# Patient Record
Sex: Female | Born: 1965
Health system: Southern US, Community
[De-identification: ages and names within clinical notes are randomized; demographics above are authoritative.]

## PROBLEM LIST (undated history)

## (undated) DIAGNOSIS — E119 Type 2 diabetes mellitus without complications: Secondary | ICD-10-CM

## (undated) DIAGNOSIS — E785 Hyperlipidemia, unspecified: Secondary | ICD-10-CM

## (undated) DIAGNOSIS — K635 Polyp of colon: Secondary | ICD-10-CM

## (undated) DIAGNOSIS — F329 Major depressive disorder, single episode, unspecified: Secondary | ICD-10-CM

## (undated) DIAGNOSIS — R42 Dizziness and giddiness: Secondary | ICD-10-CM

## (undated) DIAGNOSIS — I739 Peripheral vascular disease, unspecified: Secondary | ICD-10-CM

## (undated) DIAGNOSIS — I7090 Unspecified atherosclerosis: Secondary | ICD-10-CM

## (undated) DIAGNOSIS — M1990 Unspecified osteoarthritis, unspecified site: Secondary | ICD-10-CM

## (undated) DIAGNOSIS — I10 Essential (primary) hypertension: Secondary | ICD-10-CM

## (undated) DIAGNOSIS — G43909 Migraine, unspecified, not intractable, without status migrainosus: Secondary | ICD-10-CM

## (undated) DIAGNOSIS — F419 Anxiety disorder, unspecified: Secondary | ICD-10-CM

## (undated) DIAGNOSIS — K219 Gastro-esophageal reflux disease without esophagitis: Secondary | ICD-10-CM

## (undated) DIAGNOSIS — F32A Depression, unspecified: Secondary | ICD-10-CM

## (undated) HISTORY — PX: TOTAL ABDOMINAL HYSTERECTOMY: SHX209

## (undated) HISTORY — DX: Unspecified atherosclerosis: I70.90

## (undated) HISTORY — DX: Unspecified osteoarthritis, unspecified site: M19.90

## (undated) HISTORY — DX: Depression, unspecified: F32.A

## (undated) HISTORY — PX: ABDOMINAL HYSTERECTOMY: SHX81

## (undated) HISTORY — DX: Essential (primary) hypertension: I10

## (undated) HISTORY — DX: Peripheral vascular disease, unspecified: I73.9

## (undated) HISTORY — DX: Anxiety disorder, unspecified: F41.9

## (undated) HISTORY — DX: Dizziness and giddiness: R42

## (undated) HISTORY — DX: Hyperlipidemia, unspecified: E78.5

## (undated) HISTORY — DX: Polyp of colon: K63.5

## (undated) HISTORY — DX: Type 2 diabetes mellitus without complications: E11.9

## (undated) HISTORY — DX: Major depressive disorder, single episode, unspecified: F32.9

## (undated) HISTORY — DX: Migraine, unspecified, not intractable, without status migrainosus: G43.909

## (undated) HISTORY — PX: CHOLECYSTECTOMY: SHX55

## (undated) HISTORY — DX: Gastro-esophageal reflux disease without esophagitis: K21.9

---

## 2004-10-28 ENCOUNTER — Emergency Department (HOSPITAL_COMMUNITY): Admission: EM | Admit: 2004-10-28 | Discharge: 2004-10-28 | Payer: Self-pay | Admitting: Family Medicine

## 2004-10-28 ENCOUNTER — Ambulatory Visit (HOSPITAL_COMMUNITY): Admission: RE | Admit: 2004-10-28 | Discharge: 2004-10-28 | Payer: Self-pay | Admitting: Family Medicine

## 2004-11-01 ENCOUNTER — Encounter: Admission: RE | Admit: 2004-11-01 | Discharge: 2004-11-01 | Payer: Self-pay | Admitting: General Surgery

## 2005-03-25 ENCOUNTER — Ambulatory Visit (HOSPITAL_COMMUNITY): Admission: RE | Admit: 2005-03-25 | Discharge: 2005-03-25 | Payer: Self-pay | Admitting: General Surgery

## 2005-03-31 ENCOUNTER — Ambulatory Visit: Payer: Self-pay

## 2005-03-31 ENCOUNTER — Encounter: Payer: Self-pay | Admitting: Internal Medicine

## 2005-04-02 ENCOUNTER — Ambulatory Visit: Payer: Self-pay | Admitting: Internal Medicine

## 2005-04-16 ENCOUNTER — Encounter (INDEPENDENT_AMBULATORY_CARE_PROVIDER_SITE_OTHER): Payer: Self-pay | Admitting: *Deleted

## 2005-04-16 ENCOUNTER — Ambulatory Visit (HOSPITAL_COMMUNITY): Admission: RE | Admit: 2005-04-16 | Discharge: 2005-04-17 | Payer: Self-pay | Admitting: General Surgery

## 2005-07-12 ENCOUNTER — Observation Stay (HOSPITAL_COMMUNITY): Admission: EM | Admit: 2005-07-12 | Discharge: 2005-07-13 | Payer: Self-pay | Admitting: Emergency Medicine

## 2005-07-15 ENCOUNTER — Ambulatory Visit: Payer: Self-pay | Admitting: Family Medicine

## 2005-08-13 ENCOUNTER — Ambulatory Visit: Payer: Self-pay | Admitting: Family Medicine

## 2005-09-30 ENCOUNTER — Ambulatory Visit: Payer: Self-pay | Admitting: Family Medicine

## 2005-11-05 ENCOUNTER — Ambulatory Visit: Payer: Self-pay | Admitting: Family Medicine

## 2005-11-18 ENCOUNTER — Ambulatory Visit: Payer: Self-pay | Admitting: Family Medicine

## 2006-01-30 ENCOUNTER — Ambulatory Visit: Payer: Self-pay | Admitting: Family Medicine

## 2006-05-01 ENCOUNTER — Ambulatory Visit: Payer: Self-pay | Admitting: Family Medicine

## 2010-06-21 NOTE — Op Note (Signed)
Hannah Mcdaniel, Hannah Mcdaniel              ACCOUNT NO.:  [id number]   MEDICAL RECORD NO.:  [id number]          PATIENT TYPE:  OIB   LOCATION:  1010                         FACILITY:  [facility]   PHYSICIAN:  Anselm Pancoast. Weatherly, M.D.DATE OF BIRTH:  [birth year]   DATE OF PROCEDURE:  04/16/2005  DATE OF DISCHARGE:  04/17/2005                                 OPERATIVE REPORT   PREOPERATIVE DIAGNOSIS:  Chronic cholecystitis with stones.   POSTOPERATIVE DIAGNOSIS:  Chronic cholecystitis with stones.   OPERATIONS:  Laparoscopic cholecystectomy with cholangiogram.   SURGEON:  Dr. Consuello Bossier   ASSISTANT:  Dr. Jerelene Redden.   ANESTHESIA:  General.   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  He  slowly recovered, and she was sent for an ultrasound that does show stones  within her gallbladder, and she has had intermittent episodes of epigastric  discomfort, usually postprandially.  Her liver function studies were normal,  and her surgery was scheduled about a month ago, but she had an abnormal EKG  with flipped ST waves, etc., and anesthesia desired that she have a cardiac  clearance which was done with no abnormalities noted.  She is a cigarette  smoker but otherwise is in good health, and she is here today for a planned  cholecystectomy.  She is allergic to PENICILLIN and was given 400 mg of  Cipro intravenously preoperatively.  The patient was taken to the operative  suite.  Induction of general anesthesia, endotracheal tube, oral tube into  the stomach, and then abdomen was prepped with Betadine solution and draped  in a sterile manner.  A small incision was made below the umbilicus; the  fascia was identified, picked up between 2 Kochers, and a small opening  carefully made through the fascia into the peritoneal  cavity.  A pursestring  suture of 0 Vicryl was placed and Hasson cannula introduced.  The  gallbladder was tense but not acutely inflamed.  There were some adhesions  around it, and the upper 10 mL trocar was placed after anesthetizing the  fascia at the subxiphoid area, and the 2 lateral 5 mm trocars were placed in  the appropriate lateral position by Dr. Maryagnes Amos.  The gallbladder was grasped  and retracted upward.  The adhesions around it were carefully taken down,  good hemostasis obtained.  There were a little adhesions to the lateral  aspect of the liver that was dropped down to allow [place] to elevate the  gallbladder for better view.  The proximal portion of the gallbladder was  carefully dissected.  The junction of the cystic duct and gallbladder was  identified.  There was a little blood vessel located anterior to this, was  doubly clipped proximally, singly, distally and divided, and then the  gallbladder clip was placed on the junction of the cystic duct gallbladder.  A small opening was made in the cystic duct, and there was bile coming back  but as far as getting the catheter to go in, it was difficult.  Down there  where there was a twist, I could not see definitely a valve, but finally we  were able to kind of hook the catheter within the cystic duct, held in place  with a clip, and then an x-ray obtained.  The little short cystic duct with  good flow into the common bile duct, a very small common bile duct, and good  flow into the duodenum.  The catheter was then removed, and the cystic duct  was triply clipped very close to the ends so that we did not compromise this  junction with the common hepatic and common bile duct.  The gallbladder, the  cystic artery, and there were small branches, and these were doubly clipped  proximally and then divided and good hemostasis obtained as the gallbladder  was then freed from its bed.  I think actually we probably clipped the  anterior and  posterior branch of the cystic artery close to the gallbladder  and not really a large vessel, but there was good hemostasis, and  gallbladder was placed in the EndoCatch bag.  We went back and inspected at  the end where the cystic duct was clipped.  There was no evidence of any  bleeding and then switched the camera to the upper 10 mm port, withdrew the  gallbladder within the bag.  The additional figure-of-eight suture of 0  Vicryl was placed in the umbilicus; both were tied.  __________ Xylocaine in  the fascia, and then the lateral 5 mL trocar was withdrawn under direct  vision.  The irrigating fluid had been aspirated, and the subcutaneous  wounds were closed with 4-0 Vicryl.  Benzoin, Steri-Strips on the skin.  The  patient tolerated the procedure nicely and was sent to recovery room,  extubated in satisfactory postop condition.  We will plan on keeping her  tonight.  She should be discharged in the morning and hopefully will have no  problems postoperatively.           ______________________________  Anselm Pancoast. Zachery Dakins, M.D.     WJW/MEDQ  D:  04/16/2005  T:  04/17/2005  Job:  [id number]

## 2010-06-21 NOTE — Discharge Summary (Signed)
Hannah Mcdaniel, Hannah Mcdaniel              ACCOUNT NO.:  [id number]   MEDICAL RECORD NO.:  [id number]          PATIENT TYPE:  INP   LOCATION:  3738                         FACILITY:  [facility]   PHYSICIAN:  C. Ulyess Mort, M.D.DATE OF BIRTH:  09/04/1965   DATE OF ADMISSION:  07/12/2005  DATE OF DISCHARGE:  07/13/2005                                 DISCHARGE SUMMARY   DISCHARGE DIAGNOSES:  1.  Chest pain.  2.  Tobacco abuse.  3.  Dyslipidemia.  4.  History of laparoscopic cholecystectomy March 2007.  5.  History of hysterectomy.   MEDICATIONS AT DISCHARGE:  Zocor 20 mg p.o. daily.   CONDITION ON DISCHARGE:  Improved with resolution of chest pain, ruled out  for acute myocardial infarction.  The patient will follow up with her  primary care physician, Dr. Lysbeth Galas in [place].   PROCEDURES:  Chest x-ray July 12, 2005:  No acute disease.   BRIEF HISTORY AND PHYSICAL:  Ms. Bunte is a 45 year old woman with a positive  family history for coronary artery disease, current tobacco abuse, and known  EKG abnormalities, who presented to the emergency department with chest pain  which occurred while driving home from work.  The pain was described as  sharp, constant, nonexertional, and nonpleuritic.  The pain was associated  with left arm numbness, shortness of breath, diaphoresis, but no nausea or  vomiting.  She reports similar episodes of chest pain in the past, which  have also been nonexertional.  However, this episode is more severe.  The  pain lasted until the patient arrived to the emergency department and was  relieved with the administration of oxygen and nitroglycerin.  The patient  notes being under increased levels of stress recently.  Of note, T-wave  inversions were identified on an EKG done as a preoperative evaluation for a  cholecystectomy in March of this year.  She did have an echocardiogram, an  exercise stress test and a Cardiolite as workup for these abnormalities, all  of which  were negative.  She has been seen by [facility].   PHYSICAL EXAMINATION:  VITAL SIGNS:  Temperature is 97.9, blood pressure  102/56, pulse 72, respirations 18, oxygen saturation 99% on room air.  GENERAL:  She is in no acute distress.  She is not actively having chest  pain at the time of our evaluation.  HEENT:  Eyes:  Pupils equal, round, and reactive to light.  Extraocular  movements intact.  Sclerae were anicteric.  ENT:  Oropharynx is clear.  Mucous membranes are moist.  NECK:  Supple, no JVD, no thyromegaly.  LUNGS:  Clear to auscultation bilaterally.  CARDIAC:  Regular rate and rhythm.  No murmurs, rubs or gallops.  ABDOMEN:  Soft, nontender, nondistended, positive bowel sounds.  EXTREMITIES:  No edema.  NEUROLOGIC:  Nonfocal.   ADMISSION LABORATORY DATA:  White blood cells 9.7, hemoglobin 14.0,  platelets 264.  D-dimer less than 0.22.  Sodium 141, potassium 4.1, chloride  111, bicarb 24, BUN 13, creatinine 0.7, nonfasting glucose 157.  Urine drug  screen negative.  Point of care markers including a  troponin I were  negative.   HOSPITAL COURSE:  CHEST PAIN:  EKG on admission revealed T-wave  abnormalities in leads III and V1 through V4, which were similar to changes  noted on previous EKGs.  She was admitted for observation to rule out acute  myocardial infarction.  Cardiac enzymes were negative.  TSH was checked and  was within normal limits.  Total cholesterol on a fasting panel was 239 with  triglycerides of 359, HDL of 26 and LDL 141.  Given the patient's recent  thorough evaluation 3 or 4 months ago, a decision was made to focus on  reducing risk factors.  The patient was strongly encouraged to stop smoking.  She was given a prescription for Zocor 20 mg daily, given the very low HDL  and relatively high LDL.  She will follow up with her primary care  physician, Dr. Lysbeth Galas.  Of note, a blood glucose was 157 on a nonfasting  panel.  This may be indicative of glucose  intolerance and can also be  followed up by the patient's primary physician.  She has been instructed to  contact Dr. Lysbeth Galas in 1-2 weeks for a follow-up appointment.      Clent Demark, M.D.    ______________________________  C. Ulyess Mort, M.D.    Verlin Grills  D:  07/13/2005  T:  07/14/2005  Job:  [id number]   cc:   Delaney Meigs, M.D.  Fax: [phone]

## 2012-12-04 DIAGNOSIS — I7090 Unspecified atherosclerosis: Secondary | ICD-10-CM

## 2012-12-04 DIAGNOSIS — I739 Peripheral vascular disease, unspecified: Secondary | ICD-10-CM

## 2012-12-04 HISTORY — DX: Unspecified atherosclerosis: I70.90

## 2012-12-04 HISTORY — DX: Peripheral vascular disease, unspecified: I73.9

## 2012-12-29 ENCOUNTER — Other Ambulatory Visit: Payer: Self-pay | Admitting: Vascular Surgery

## 2012-12-29 DIAGNOSIS — I70219 Atherosclerosis of native arteries of extremities with intermittent claudication, unspecified extremity: Secondary | ICD-10-CM

## 2013-01-04 ENCOUNTER — Encounter: Payer: Self-pay | Admitting: Vascular Surgery

## 2013-01-04 ENCOUNTER — Other Ambulatory Visit: Payer: Self-pay

## 2013-01-05 ENCOUNTER — Encounter (INDEPENDENT_AMBULATORY_CARE_PROVIDER_SITE_OTHER): Payer: Self-pay

## 2013-01-05 ENCOUNTER — Ambulatory Visit (INDEPENDENT_AMBULATORY_CARE_PROVIDER_SITE_OTHER): Payer: Self-pay | Admitting: Vascular Surgery

## 2013-01-05 ENCOUNTER — Other Ambulatory Visit: Payer: Self-pay | Admitting: Vascular Surgery

## 2013-01-05 ENCOUNTER — Encounter: Payer: Self-pay | Admitting: Vascular Surgery

## 2013-01-05 ENCOUNTER — Other Ambulatory Visit: Payer: Self-pay

## 2013-01-05 ENCOUNTER — Ambulatory Visit (HOSPITAL_COMMUNITY)
Admission: RE | Admit: 2013-01-05 | Discharge: 2013-01-05 | Disposition: A | Discharge status: Home / Self Care | Payer: Self-pay | Source: Ambulatory Visit | Attending: Vascular Surgery | Admitting: Vascular Surgery

## 2013-01-05 ENCOUNTER — Ambulatory Visit (HOSPITAL_COMMUNITY)
Admission: RE | Admit: 2013-01-05 | Discharge: 2013-01-05 | Disposition: A | Discharge status: Home / Self Care | Payer: PRIVATE HEALTH INSURANCE | Source: Ambulatory Visit | Attending: Vascular Surgery | Admitting: Vascular Surgery

## 2013-01-05 VITALS — BP 117/65 | HR 78 | Ht 61.5 in | Wt 153.0 lb

## 2013-01-05 DIAGNOSIS — I70219 Atherosclerosis of native arteries of extremities with intermittent claudication, unspecified extremity: Secondary | ICD-10-CM | POA: Insufficient documentation

## 2013-01-05 MED ORDER — CLOPIDOGREL BISULFATE 75 MG PO TABS: 75.0000 mg | ORAL_TABLET | Freq: Every day | ORAL | Status: DC

## 2013-01-05 NOTE — Progress Notes (Signed)
[facility] [place]  Patient name: Hannah Mcdaniel MRN: [id number] DOB: Apr 07, 1965 Sex: female  REASON FOR CONSULT: right lower extremity claudication. Referred by Dr. Ophelia Charter.  HPI: Hannah Mcdaniel is a 47 y.o. female who developed the gradual onset of right hip pain and subsequently right calf pain approximately a year ago. She experienced pain in her right hip and also in her right calf even with simply standing or at rest. Her symptoms are aggravated by walking. Her symptoms have gradually progressed over the last year. She has not had any symptoms in her left lower extremity. She denies any history of rest pain. In fact, she states the elevating her right leg makes her symptoms feel better. He denies any history of nonhealing wounds.  On my history she denies any history of diabetes or hypertension. She does admit to hypercholesterolemia but states that she is currently not on medication. She smokes half a pack per day of cigarettes.   Past Medical History  Diagnosis Date  . Claudication of lower extremity Nov. 2014    Right Lower Extremity rest pain  . Diabetes mellitus without complication   . Hypertension   . Anxiety   . Arthritis   . Depression   . Migraines   . Arterial occlusive disease Nov. 2014   Family History  Problem Relation Age of Onset  . Diabetes Mother   . Hyperlipidemia Mother   . Hypertension Mother   . Cancer Father   . Diabetes Brother   . Hypertension Brother    SOCIAL HISTORY: History  Substance Use Topics  . Smoking status: Current Every Day Smoker -- 0.50 packs/day    Types: Cigarettes  . Smokeless tobacco: Never Used  . Alcohol Use: No   Allergies  Allergen Reactions  . Asa [Aspirin] Rash  . Codeine Rash  . Penicillins Rash   Current Outpatient Prescriptions  Medication Sig Dispense Refill  . citalopram (CELEXA) 10 MG tablet Take 10 mg by mouth daily.      . meclizine (ANTIVERT) 25 MG tablet Take 25 mg by mouth  3 (three) times daily as needed for dizziness.      Marland Kitchen HYDROcodone-acetaminophen (NORCO/VICODIN) 5-325 MG per tablet Take 1 tablet by mouth every 6 (six) hours as needed for moderate pain.      . methocarbamol (ROBAXIN) 500 MG tablet Take 500 mg by mouth 4 (four) times daily.       No current facility-administered medications for this visit.   REVIEW OF SYSTEMS: Arly.Keller ] denotes positive finding; [  ] denotes negative finding  CARDIOVASCULAR:  [ ]  chest pain   [ ]  chest pressure   [ ]  palpitations   [ ]  orthopnea   [ ]  dyspnea on exertion   Arly.Keller ] claudication right calf and right hip  [ ]  rest pain   [ ]  DVT   [ ]  phlebitis PULMONARY:   [ ]  productive cough   [ ]  asthma   [ ]  wheezing NEUROLOGIC:   [ ]  weakness  [ ]  paresthesias  [ ]  aphasia  [ ]  amaurosis  [ ]  dizziness HEMATOLOGIC:   [ ]  bleeding problems   [ ]  clotting disorders MUSCULOSKELETAL:  [ ]  joint pain   [ ]  joint swelling [ ]  leg swelling GASTROINTESTINAL: [ ]   blood in stool  [ ]   hematemesis GENITOURINARY:  [ ]   dysuria  [ ]   hematuria PSYCHIATRIC:  Arly.Keller ] history of major depression INTEGUMENTARY:  [ ]   rashes  [ ]  ulcers CONSTITUTIONAL:  [ ]  fever   [ ]  chills  PHYSICAL EXAM: Filed Vitals:   01/05/13 1009  BP: 117/65  Pulse: 78  Height: 5' 1.5" (1.562 m)  Weight: 153 lb (69.4 kg)  SpO2: 97%   Body mass index is 28.44 kg/(m^2). GENERAL: The patient is a well-nourished female, in no acute distress. The vital signs are documented above. CARDIOVASCULAR: There is a regular rate and rhythm. I do not detect carotid bruits. She has palpable radial pulses. On the left side, she has a palpable femoral, popliteal, and dorsalis pedis pulse. On the right side I cannot palpate a femoral, popliteal, dorsalis pedis, or posterior tibial pulse. She has no significant lower extremity swelling. PULMONARY: There is good air exchange bilaterally without wheezing or rales. ABDOMEN: Soft and non-tender with normal pitched bowel sounds. I do not  palpate an abdominal aortic aneurysm. MUSCULOSKELETAL: There are no major deformities or cyanosis. NEUROLOGIC: No focal weakness or paresthesias are detected. SKIN: There are no ulcers or rashes noted. PSYCHIATRIC: The patient has a normal affect.  DATA:  I have independently interpreted her duplex of her aorta and iliac arteries. She appears to have a stenosis of the right common iliac artery. Peak systolic velocity is 511 cm/s suggesting a significant stenosis.  I have also independently interpreted the right lower extremity duplex which shows occlusion of her posterior tibial and anterior tibial arteries. The peroneal artery appears to be patent.  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 I've recommended we proceed with arteriography to further evaluate her disease and potentially proceed with angioplasty and stenting of the right common iliac artery. I have explained however, that I am not convinced all of her symptoms are related to her peripheral vascular disease. She experiences pain simply with standing which did not really fit with her pattern of disease. In addition she states that elevating her right leg helped somewhat which would not fit with peripheral vascular disease. We have also had a long discussion about the importance of tobacco cessation. I have reviewed with the patient the indications for arteriography. In addition, I have reviewed the potential complications of arteriography including but not limited to: Bleeding, arterial injury, arterial thrombosis, dye action, renal insufficiency, or other unpredictable medical problems. I have explained to the patient that if we find disease amenable to angioplasty we could potentially address this at the same time. I have discussed  the potential complications of angioplasty and stenting, including but not limited to: Bleeding, arterial thrombosis, arterial injury, dissection, or the need for surgical intervention. I have written her a prescription for Plavix to begin taking prior to the procedure. If she has an intervention we would need to continue her Plavix after the procedure. If she does not undergo iliac angioplasty then we would discontinue this. Also stressed the importance of getting on a structured walking program and also the importance of nutrition. We will make further recommendations pending results of her arteriogram which is scheduled for 01/10/2013. Of note she is not on a statin but apparently had been on one. I'm not sure if this was discontinued because of intolerance. She also tells me that she's allergic to aspirin.   Shamere Dilworth S [facility] [place] Beeper: [phone]

## 2013-01-05 NOTE — Assessment & Plan Note (Signed)
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 I've recommended we proceed with arteriography to further evaluate her disease and potentially proceed with angioplasty and stenting of the right common iliac artery. I have explained however, that I am not convinced all of her symptoms are related to her peripheral vascular disease. She experiences pain simply with standing which did not really fit with her pattern of disease. In addition she states that elevating her right leg helped somewhat which would not fit with peripheral vascular disease. We have also had a long discussion about the importance of tobacco cessation. I have reviewed with the patient the indications for arteriography. In addition, I have reviewed the potential complications of arteriography including but not limited to: Bleeding, arterial injury, arterial thrombosis, dye action, renal insufficiency, or other unpredictable medical problems. I have explained to the patient that if we find disease amenable to angioplasty we could potentially address this at the same time. I have discussed the potential complications of angioplasty and stenting, including but not limited to: Bleeding, arterial thrombosis, arterial injury, dissection, or the need for surgical intervention. I have written her a prescription for Plavix to begin taking prior to the procedure. If she has an intervention we would need to continue her Plavix after the procedure. If she does not undergo iliac angioplasty then we would discontinue this. Also stressed the importance of getting on a structured walking program and also the importance of nutrition. We will make further recommendations pending results of her arteriogram which is scheduled for 01/10/2013. Of note she is not on a statin but apparently had been on one. I'm not sure if this was discontinued because of intolerance.  She also tells me that she's allergic to aspirin.

## 2013-01-07 ENCOUNTER — Encounter (HOSPITAL_COMMUNITY): Payer: Self-pay | Admitting: Pharmacy Technician

## 2013-01-10 ENCOUNTER — Other Ambulatory Visit: Payer: Self-pay | Admitting: *Deleted

## 2013-01-10 ENCOUNTER — Encounter (HOSPITAL_COMMUNITY)
Admission: RE | Disposition: A | Discharge status: Home / Self Care | Payer: Self-pay | Source: Ambulatory Visit | Attending: Vascular Surgery

## 2013-01-10 ENCOUNTER — Other Ambulatory Visit: Payer: Self-pay

## 2013-01-10 ENCOUNTER — Ambulatory Visit (HOSPITAL_COMMUNITY)
Admission: RE | Admit: 2013-01-10 | Discharge: 2013-01-10 | Disposition: A | Discharge status: Home / Self Care | Payer: PRIVATE HEALTH INSURANCE | Source: Ambulatory Visit | Attending: Vascular Surgery | Admitting: Vascular Surgery

## 2013-01-10 DIAGNOSIS — F329 Major depressive disorder, single episode, unspecified: Secondary | ICD-10-CM | POA: Insufficient documentation

## 2013-01-10 DIAGNOSIS — F411 Generalized anxiety disorder: Secondary | ICD-10-CM | POA: Insufficient documentation

## 2013-01-10 DIAGNOSIS — I739 Peripheral vascular disease, unspecified: Secondary | ICD-10-CM

## 2013-01-10 DIAGNOSIS — I10 Essential (primary) hypertension: Secondary | ICD-10-CM | POA: Insufficient documentation

## 2013-01-10 DIAGNOSIS — I70219 Atherosclerosis of native arteries of extremities with intermittent claudication, unspecified extremity: Secondary | ICD-10-CM | POA: Insufficient documentation

## 2013-01-10 DIAGNOSIS — Z79899 Other long term (current) drug therapy: Secondary | ICD-10-CM | POA: Insufficient documentation

## 2013-01-10 DIAGNOSIS — G43909 Migraine, unspecified, not intractable, without status migrainosus: Secondary | ICD-10-CM | POA: Insufficient documentation

## 2013-01-10 DIAGNOSIS — I7090 Unspecified atherosclerosis: Secondary | ICD-10-CM

## 2013-01-10 DIAGNOSIS — F3289 Other specified depressive episodes: Secondary | ICD-10-CM | POA: Insufficient documentation

## 2013-01-10 DIAGNOSIS — M129 Arthropathy, unspecified: Secondary | ICD-10-CM | POA: Insufficient documentation

## 2013-01-10 DIAGNOSIS — I708 Atherosclerosis of other arteries: Secondary | ICD-10-CM | POA: Insufficient documentation

## 2013-01-10 DIAGNOSIS — F17200 Nicotine dependence, unspecified, uncomplicated: Secondary | ICD-10-CM | POA: Insufficient documentation

## 2013-01-10 DIAGNOSIS — E119 Type 2 diabetes mellitus without complications: Secondary | ICD-10-CM | POA: Insufficient documentation

## 2013-01-10 HISTORY — PX: PERCUTANEOUS STENT INTERVENTION: SHX5500

## 2013-01-10 HISTORY — PX: OTHER SURGICAL HISTORY: SHX169

## 2013-01-10 HISTORY — PX: ABDOMINAL AORTAGRAM: SHX5454

## 2013-01-10 HISTORY — PX: LOWER EXTREMITY ANGIOGRAM: SHX5508

## 2013-01-10 LAB — POCT I-STAT, CHEM 8
BUN: 13 mg/dL (ref 6–23)
Calcium, Ion: 1.22 mmol/L (ref 1.12–1.23)
Chloride: 103 mEq/L (ref 96–112)
HCT: 46 % (ref 36.0–46.0)
Sodium: 139 mEq/L (ref 135–145)
TCO2: 25 mmol/L (ref 0–100)

## 2013-01-10 LAB — POCT ACTIVATED CLOTTING TIME
Activated Clotting Time: 191 seconds
Activated Clotting Time: 170 seconds

## 2013-01-10 SURGERY — ABDOMINAL AORTAGRAM
Anesthesia: LOCAL | Laterality: Right

## 2013-01-10 MED ORDER — HEPARIN SODIUM (PORCINE) 1000 UNIT/ML IJ SOLN
INTRAMUSCULAR | Status: AC
  Filled 2013-01-10: qty 1

## 2013-01-10 MED ORDER — ACETAMINOPHEN 325 MG RE SUPP: 325.0000 mg | RECTAL | Status: DC | PRN

## 2013-01-10 MED ORDER — HYDRALAZINE HCL 20 MG/ML IJ SOLN: 10.0000 mg | INTRAMUSCULAR | Status: DC | PRN

## 2013-01-10 MED ORDER — PANTOPRAZOLE SODIUM 40 MG PO TBEC: 40.0000 mg | DELAYED_RELEASE_TABLET | Freq: Every day | ORAL | Status: DC

## 2013-01-10 MED ORDER — SODIUM CHLORIDE 0.9 % IV SOLN: 500.0000 mL | Freq: Once | INTRAVENOUS | Status: DC | PRN

## 2013-01-10 MED ORDER — LIDOCAINE HCL (PF) 1 % IJ SOLN
INTRAMUSCULAR | Status: AC
  Filled 2013-01-10: qty 30

## 2013-01-10 MED ORDER — SODIUM CHLORIDE 0.9 % IV SOLN
INTRAVENOUS | Status: DC
  Administered 2013-01-10: 09:00:00 via INTRAVENOUS

## 2013-01-10 MED ORDER — ACETAMINOPHEN 325 MG PO TABS: 325.0000 mg | ORAL_TABLET | ORAL | Status: DC | PRN

## 2013-01-10 MED ORDER — CLOPIDOGREL BISULFATE 75 MG PO TABS: 75.0000 mg | ORAL_TABLET | Freq: Every day | ORAL | Status: DC

## 2013-01-10 MED ORDER — SODIUM CHLORIDE 0.9 % IV SOLN: 1.0000 mL/kg/h | INTRAVENOUS | Status: DC

## 2013-01-10 MED ORDER — HEPARIN (PORCINE) IN NACL 2-0.9 UNIT/ML-% IJ SOLN
INTRAMUSCULAR | Status: AC
  Filled 2013-01-10: qty 1000

## 2013-01-10 MED ORDER — CLOPIDOGREL BISULFATE 75 MG PO TABS
75.0000 mg | ORAL_TABLET | Freq: Once | ORAL | Status: AC
  Administered 2013-01-10: 75 mg via ORAL
  Filled 2013-01-10: qty 1

## 2013-01-10 MED ORDER — LABETALOL HCL 5 MG/ML IV SOLN: 10.0000 mg | INTRAVENOUS | Status: DC | PRN

## 2013-01-10 NOTE — Interval H&P Note (Signed)
History and Physical Interval Note:  01/10/2013 11:13 AM  Hannah Mcdaniel  has presented today for surgery, with the diagnosis of pvd  The various methods of treatment have been discussed with the patient and family. After consideration of risks, benefits and other options for treatment, the patient has consented to  Procedure(s): ABDOMINAL AORTAGRAM (N/A) as a surgical intervention .  The patient's history has been reviewed, patient examined, no change in status, stable for surgery.  I have reviewed the patient's chart and labs.  Questions were answered to the patient's satisfaction.     Isaic Syler

## 2013-01-10 NOTE — H&P (View-Only) (Signed)
  [facility] [place]  Patient name: Hannah Mcdaniel MRN: [id number] DOB: 09/05/1965 Sex: female  REASON FOR CONSULT: right lower extremity claudication. Referred by Dr. Yates.  HPI: Hannah Mcdaniel is a 47 y.o. female who developed the gradual onset of right hip pain and subsequently right calf pain approximately a year ago. She experienced pain in her right hip and also in her right calf even with simply standing or at rest. Her symptoms are aggravated by walking. Her symptoms have gradually progressed over the last year. She has not had any symptoms in her left lower extremity. She denies any history of rest pain. In fact, she states the elevating her right leg makes her symptoms feel better. He denies any history of nonhealing wounds.  On my history she denies any history of diabetes or hypertension. She does admit to hypercholesterolemia but states that she is currently not on medication. She smokes half a pack per day of cigarettes.   Past Medical History  Diagnosis Date  . Claudication of lower extremity Nov. 2014    Right Lower Extremity rest pain  . Diabetes mellitus without complication   . Hypertension   . Anxiety   . Arthritis   . Depression   . Migraines   . Arterial occlusive disease Nov. 2014   Family History  Problem Relation Age of Onset  . Diabetes Mother   . Hyperlipidemia Mother   . Hypertension Mother   . Cancer Father   . Diabetes Brother   . Hypertension Brother    SOCIAL HISTORY: History  Substance Use Topics  . Smoking status: Current Every Day Smoker -- 0.50 packs/day    Types: Cigarettes  . Smokeless tobacco: Never Used  . Alcohol Use: No   Allergies  Allergen Reactions  . Asa [Aspirin] Rash  . Codeine Rash  . Penicillins Rash   Current Outpatient Prescriptions  Medication Sig Dispense Refill  . citalopram (CELEXA) 10 MG tablet Take 10 mg by mouth daily.      . meclizine (ANTIVERT) 25 MG tablet Take 25 mg by mouth  3 (three) times daily as needed for dizziness.      . HYDROcodone-acetaminophen (NORCO/VICODIN) 5-325 MG per tablet Take 1 tablet by mouth every 6 (six) hours as needed for moderate pain.      . methocarbamol (ROBAXIN) 500 MG tablet Take 500 mg by mouth 4 (four) times daily.       No current facility-administered medications for this visit.   REVIEW OF SYSTEMS: [X ] denotes positive finding; [  ] denotes negative finding  CARDIOVASCULAR:  [ ] chest pain   [ ] chest pressure   [ ] palpitations   [ ] orthopnea   [ ] dyspnea on exertion   [X ] claudication right calf and right hip  [ ] rest pain   [ ] DVT   [ ] phlebitis PULMONARY:   [ ] productive cough   [ ] asthma   [ ] wheezing NEUROLOGIC:   [ ] weakness  [ ] paresthesias  [ ] aphasia  [ ] amaurosis  [ ] dizziness HEMATOLOGIC:   [ ] bleeding problems   [ ] clotting disorders MUSCULOSKELETAL:  [ ] joint pain   [ ] joint swelling [ ] leg swelling GASTROINTESTINAL: [ ]  blood in stool  [ ]  hematemesis GENITOURINARY:  [ ]  dysuria  [ ]  hematuria PSYCHIATRIC:  [X ] history of major depression INTEGUMENTARY:  [ ]   rashes  [ ] ulcers CONSTITUTIONAL:  [ ] fever   [ ] chills  PHYSICAL EXAM: Filed Vitals:   01/05/13 1009  BP: 117/65  Pulse: 78  Height: 5' 1.5" (1.562 m)  Weight: 153 lb (69.4 kg)  SpO2: 97%   Body mass index is 28.44 kg/(m^2). GENERAL: The patient is a well-nourished female, in no acute distress. The vital signs are documented above. CARDIOVASCULAR: There is a regular rate and rhythm. I do not detect carotid bruits. She has palpable radial pulses. On the left side, she has a palpable femoral, popliteal, and dorsalis pedis pulse. On the right side I cannot palpate a femoral, popliteal, dorsalis pedis, or posterior tibial pulse. She has no significant lower extremity swelling. PULMONARY: There is good air exchange bilaterally without wheezing or rales. ABDOMEN: Soft and non-tender with normal pitched bowel sounds. I do not  palpate an abdominal aortic aneurysm. MUSCULOSKELETAL: There are no major deformities or cyanosis. NEUROLOGIC: No focal weakness or paresthesias are detected. SKIN: There are no ulcers or rashes noted. PSYCHIATRIC: The patient has a normal affect.  DATA:  I have independently interpreted her duplex of her aorta and iliac arteries. She appears to have a stenosis of the right common iliac artery. Peak systolic velocity is 511 cm/s suggesting a significant stenosis.  I have also independently interpreted the right lower extremity duplex which shows occlusion of her posterior tibial and anterior tibial arteries. The peroneal artery appears to be patent.  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 I've recommended we proceed with arteriography to further evaluate her disease and potentially proceed with angioplasty and stenting of the right common iliac artery. I have explained however, that I am not convinced all of her symptoms are related to her peripheral vascular disease. She experiences pain simply with standing which did not really fit with her pattern of disease. In addition she states that elevating her right leg helped somewhat which would not fit with peripheral vascular disease. We have also had a long discussion about the importance of tobacco cessation. I have reviewed with the patient the indications for arteriography. In addition, I have reviewed the potential complications of arteriography including but not limited to: Bleeding, arterial injury, arterial thrombosis, dye action, renal insufficiency, or other unpredictable medical problems. I have explained to the patient that if we find disease amenable to angioplasty we could potentially address this at the same time. I have discussed  the potential complications of angioplasty and stenting, including but not limited to: Bleeding, arterial thrombosis, arterial injury, dissection, or the need for surgical intervention. I have written her a prescription for Plavix to begin taking prior to the procedure. If she has an intervention we would need to continue her Plavix after the procedure. If she does not undergo iliac angioplasty then we would discontinue this. Also stressed the importance of getting on a structured walking program and also the importance of nutrition. We will make further recommendations pending results of her arteriogram which is scheduled for 01/10/2013. Of note she is not on a statin but apparently had been on one. I'm not sure if this was discontinued because of intolerance. She also tells me that she's allergic to aspirin.   Vallery Mcdade S [facility] [place] Beeper: [phone]    

## 2013-01-10 NOTE — Op Note (Signed)
OPERATIVE REPORT  DATE OF SURGERY: 01/10/2013  PATIENT: Hannah Mcdaniel, 47 y.o. female MRN: [id number]  DOB: 02/07/1965  PRE-OPERATIVE DIAGNOSIS: Limiting claudication right leg  POST-OPERATIVE DIAGNOSIS:  Same  PROCEDURE: #1 aortogram with bilateral lower extremity runoff, #2 right common iliac pack artery angioplasty and Genesis stent placement 7 x 24 mm SURGEON:  Gretta Began, M.D.  PHYSICIAN ASSISTANT: Nurse  ANESTHESIA:  1% local  EBL: Minimal ml     BLOOD ADMINISTERED: None  DRAINS: None  SPECIMEN: None  COUNTS CORRECT:  YES  PLAN OF CARE: Holding area   PATIENT DISPOSITION:  PACU - hemodynamically stable  PROCEDURE DETAILS: The patient was taken to the peripheral vascular cath lab and placed supine position where the area of both groins were prepped and draped in the usual sterile fashion. Using SonoSite ultrasound the right common femoral artery was visualized. Using local anesthesia and the Seldinger technique the right common femoral artery was easily entered with an 18-gauge needle with a single wall puncture. Guidewire was passed centrally. This was a Transport planner. The guidewire would initially not pass the area of the common iliac artery on the right. A 5 French sheath was passed over the guidewire. A comfy catheter was passed over the guidewire and the guidewire easily passed into the level of the aorta. A pigtail catheter was then positioned over the guidewire the level of the suprarenal aorta an AP projection was undertaken. This showed either irregular plaque or organized thrombus at the level of the renal arteries. This did not appear to be flow limiting. The pigtail catheter was positioned further proximally to visualize the distal thoracic aorta and this was completely normal except for the area at the level of the renal arteries. A lateral projection of this did not give any additional information with apparently a posterior plaque at the level of the renal  arteries.  The patient did have a subtotal occlusion of the common to iliac artery just distal to the aortic bifurcation. Runoff films revealed normal anatomy on the left with widely patent common superficial femoral popliteal and three-vessel runoff on the left. On the right the superficial femoral and popliteal arteries were widely patent. The posterior tibial artery on the right occluded just past the origin with collaterals down towards the cath. The anterior tibial and peroneal arteries were patent proximally. The peroneal artery gave off collaterals to the posterior tibial at the ankle. The anterior tibial did not visualize past the ankle.  Decision was made to intervene on the symptomatic high-grade right common iliac lesion. The patient was given 5000 units of intravenous heparin. The 5 French sheath was exchanged for a 7 [place] right-tipped long sheath and this was positioned below the level of the iliac lesion. A retrograde hand injection revealed the location of the lesion and a marker was used. The determination was for a 7 mm x 24 mm Genesis stent stent on an op the balloon. The pigtail catheter was removed and the dilator was repositioned and the long stent was placed through the iliac stenosis. The stent was positioned at the appropriate location and the long sheath was withdrawn. The balloon was inflated and the stent was in excellent position. This was inflated for 8 atmospheres for 60 seconds. The balloon was then removed and the pigtail catheter was replaced above the level of the aortic bifurcation. Completion view showed excellent positioning with no residual stenosis. The patient pigtail catheter was removed over a guidewire and the long sheath  was removed back into the external iliac artery. The patient was transferred to the holding area in stable condition  Findings #1 irregular plaque or chronic thrombus at the level of the renal artery #2 high-grade right common iliac artery  stenosis which was treated successfully with a 7 x 24 Genesis stent #3 normal runoff in the left leg #4 normal common superficial femoral and popliteal artery with tibial vessel disease as described above   Gretta Began, M.D. 01/10/2013 12:17 PM

## 2013-01-10 NOTE — Progress Notes (Signed)
Pt discharge instruction given to pt per MD order.  Pt and Cg verbalize understanding. Pt encourged to call MD in AM to get samples for Plavix due to pt's inability to afford the medicine.

## 2013-01-11 LAB — POCT ACTIVATED CLOTTING TIME: Activated Clotting Time: 222 seconds

## 2013-01-12 ENCOUNTER — Telehealth: Payer: Self-pay | Admitting: *Deleted

## 2013-01-12 NOTE — Telephone Encounter (Signed)
Tried to call patient back re: Samples of Plavix, pt cannot afford them. Call would not go through and there was no voice mail available.  She is pending Medicaid patient. I asked Dr. Edilia Bo and he said that since patient is allergic to ASA, she has really no other options other than taking generic Plavix and far as cost is concerned. We will try to contact pt and see if she can call Dr. Joyce Copa office to see if they have any samples of plavix or any other suggestions. Annabelle Harman is trying to contact pt for scheduling of her followup appt (Aortogram with stent placement on 01-10-13 by [facility]).   She will need to be on medication because of stenting; perhaps her case worker could help her find a resource for this.

## 2013-01-13 ENCOUNTER — Telehealth: Payer: Self-pay | Admitting: Vascular Surgery

## 2013-01-13 NOTE — Telephone Encounter (Addendum)
Message copied by Fredrich Birks on Thu Jan 13, 2013 10:10 AM ------      Message from: Phillips Odor      Created: Mon Jan 10, 2013  4:18 PM      Regarding: FW: CT scan       Order has been placed/ CP      ----- Message -----         From: Chuck Hint, MD         Sent: 01/10/2013   1:56 PM           To: Melene Plan, RN, Conley Simmonds Pullins, RN      Subject: CT scan                                                  Dr Arbie Cookey has arranged f/u with me. She needs a CT with contrast to her chest and abdomen to evaluate "? Irregular plaque in juxtaranal aorta, prior to that visit.  Thanks      CD       ------  01/13/13: spoke with patient to inform of all appointment information. Mailed letter.

## 2013-02-04 ENCOUNTER — Other Ambulatory Visit: Payer: Self-pay | Admitting: Vascular Surgery

## 2013-02-04 LAB — CREATININE, SERUM: Creat: 0.63 mg/dL (ref 0.50–1.10)

## 2013-02-04 LAB — BUN: BUN: 13 mg/dL (ref 6–23)

## 2013-02-07 ENCOUNTER — Other Ambulatory Visit (HOSPITAL_COMMUNITY): Payer: Self-pay

## 2013-02-07 ENCOUNTER — Encounter: Payer: Self-pay | Admitting: Surgery

## 2013-02-08 ENCOUNTER — Encounter: Payer: Self-pay | Admitting: Vascular Surgery

## 2013-02-09 ENCOUNTER — Ambulatory Visit (INDEPENDENT_AMBULATORY_CARE_PROVIDER_SITE_OTHER): Payer: BC Managed Care – PPO | Admitting: Vascular Surgery

## 2013-02-09 ENCOUNTER — Other Ambulatory Visit: Payer: Self-pay

## 2013-02-09 ENCOUNTER — Encounter: Payer: Self-pay | Admitting: Vascular Surgery

## 2013-02-09 ENCOUNTER — Ambulatory Visit
Admission: RE | Admit: 2013-02-09 | Discharge: 2013-02-09 | Disposition: A | Discharge status: Home / Self Care | Payer: Self-pay | Source: Ambulatory Visit | Attending: Vascular Surgery | Admitting: Vascular Surgery

## 2013-02-09 VITALS — BP 144/83 | HR 101 | Resp 18 | Ht 61.0 in | Wt 158.0 lb

## 2013-02-09 DIAGNOSIS — I739 Peripheral vascular disease, unspecified: Secondary | ICD-10-CM | POA: Insufficient documentation

## 2013-02-09 DIAGNOSIS — I70219 Atherosclerosis of native arteries of extremities with intermittent claudication, unspecified extremity: Secondary | ICD-10-CM

## 2013-02-09 DIAGNOSIS — I7090 Unspecified atherosclerosis: Secondary | ICD-10-CM

## 2013-02-09 MED ORDER — IOHEXOL 300 MG/ML  SOLN
100.0000 mL | Freq: Once | INTRAMUSCULAR | Status: AC | PRN
  Administered 2013-02-09: 100 mL via INTRAVENOUS

## 2013-02-09 NOTE — Progress Notes (Signed)
   Patient name: Hannah Mcdaniel MRN: [id number] DOB: [birth year] Sex: female  REASON FOR VISIT: Follow up after right common iliac artery stenting.  HPI: Hannah Mcdaniel is a 48 y.o. female who I saw on 01/05/2013 with right lower extremity claudication. By duplex she had evidence of a stenosis in the right common iliac artery. This reason I set her up for an arteriogram and she underwent arteriography and stenting of the right common iliac artery by Dr. Donnetta Hutching on 01/10/2013. On her arteriogram was noted that she had some irregular plaque in the juxtarenal aorta and for this reason was set up for a CT scan. She comes in for a follow up visit.   Since her angioplasty her right lower extremity claudication symptoms have resolved. She denies rest pain. She does continue to smoke but is trying to cut back. She has not been taking Plavix because her insurance is not covered but she believes that she'll be able to get it soon. She is on aspirin.  REVIEW OF SYSTEMS: Valu.Nieves ] denotes positive finding; [  ] denotes negative finding  CARDIOVASCULAR:  [ ]  chest pain   [ ]  dyspnea on exertion    CONSTITUTIONAL:  [ ]  fever   [ ]  chills  PHYSICAL EXAM: Filed Vitals:   02/09/13 1259  BP: 144/83  Pulse: 101  Resp: 18  Height: 5\' 1"  (1.549 m)  Weight: 158 lb (71.668 kg)   Body mass index is 29.87 kg/(m^2). GENERAL: The patient is a well-nourished female, in no acute distress. The vital signs are documented above. CARDIOVASCULAR: There is a regular rate and rhythm. She has palpable femoral pulses and palpable dorsalis pedis pulses. She has no significant lower extremity swelling. PULMONARY: There is good air exchange bilaterally without wheezing or rales. Neuro: She has no focal weakness or paresthesias.   CT OF THE ABDOMEN: This shows extensive atherosclerotic disease of her abdominal aorta, but no critical stenosis.  MEDICAL ISSUES:  Atherosclerosis of native arteries of the extremities with  intermittent claudication The patient has done well status post right common iliac artery stenting. She think she'll be able to begin taking Plavix once her insurance to extend. We have also discussed the importance of tobacco cessation. She is on aspirin. I've ordered a follow up duplex of her right common iliac artery in 6 months with ABIs at that time also. I've encouraged her to stay as active as possible. We have also discussed the importance of nutrition. With respect to the plaque in her aorta this is fairly diffuse but there is no focal stenosis noted.   [place] [facility] [place] Beeper: [phone]

## 2013-02-09 NOTE — Assessment & Plan Note (Signed)
The patient has done well status post right common iliac artery stenting. She think she'll be able to begin taking Plavix once her insurance to extend. We have also discussed the importance of tobacco cessation. She is on aspirin. I've ordered a follow up duplex of her right common iliac artery in 6 months with ABIs at that time also. I've encouraged her to stay as active as possible. We have also discussed the importance of nutrition. With respect to the plaque in her aorta this is fairly diffuse but there is no focal stenosis noted.

## 2013-02-09 NOTE — Addendum Note (Signed)
Addended by: Mena Goes on: 02/09/2013 05:35 PM   Modules accepted: Orders

## 2013-02-11 ENCOUNTER — Other Ambulatory Visit (HOSPITAL_COMMUNITY): Payer: Self-pay

## 2013-02-11 ENCOUNTER — Encounter: Payer: Self-pay | Admitting: Vascular Surgery

## 2013-05-30 DIAGNOSIS — F331 Major depressive disorder, recurrent, moderate: Secondary | ICD-10-CM | POA: Insufficient documentation

## 2013-07-18 DIAGNOSIS — Z0279 Encounter for issue of other medical certificate: Secondary | ICD-10-CM

## 2013-08-10 ENCOUNTER — Other Ambulatory Visit (HOSPITAL_COMMUNITY): Payer: BC Managed Care – PPO

## 2013-08-10 ENCOUNTER — Encounter (HOSPITAL_COMMUNITY): Payer: BC Managed Care – PPO

## 2013-08-10 ENCOUNTER — Ambulatory Visit: Payer: BC Managed Care – PPO | Admitting: Vascular Surgery

## 2013-08-16 ENCOUNTER — Encounter: Payer: Self-pay | Admitting: Vascular Surgery

## 2013-08-17 ENCOUNTER — Other Ambulatory Visit: Payer: Self-pay | Admitting: Vascular Surgery

## 2013-08-17 ENCOUNTER — Encounter: Payer: Self-pay | Admitting: Vascular Surgery

## 2013-08-17 ENCOUNTER — Ambulatory Visit (INDEPENDENT_AMBULATORY_CARE_PROVIDER_SITE_OTHER): Payer: BC Managed Care – PPO | Admitting: Vascular Surgery

## 2013-08-17 ENCOUNTER — Ambulatory Visit (INDEPENDENT_AMBULATORY_CARE_PROVIDER_SITE_OTHER)
Admission: RE | Admit: 2013-08-17 | Discharge: 2013-08-17 | Disposition: A | Discharge status: Home / Self Care | Payer: BC Managed Care – PPO | Source: Ambulatory Visit | Attending: Vascular Surgery | Admitting: Vascular Surgery

## 2013-08-17 ENCOUNTER — Ambulatory Visit (HOSPITAL_COMMUNITY)
Admission: RE | Admit: 2013-08-17 | Discharge: 2013-08-17 | Disposition: A | Discharge status: Home / Self Care | Payer: BC Managed Care – PPO | Source: Ambulatory Visit | Attending: Vascular Surgery | Admitting: Vascular Surgery

## 2013-08-17 VITALS — BP 110/60 | HR 80 | Temp 98.4°F | Resp 18 | Ht 61.0 in | Wt 165.0 lb

## 2013-08-17 DIAGNOSIS — I70219 Atherosclerosis of native arteries of extremities with intermittent claudication, unspecified extremity: Secondary | ICD-10-CM

## 2013-08-17 DIAGNOSIS — I739 Peripheral vascular disease, unspecified: Secondary | ICD-10-CM

## 2013-08-17 DIAGNOSIS — M79609 Pain in unspecified limb: Secondary | ICD-10-CM

## 2013-08-17 DIAGNOSIS — Z48812 Encounter for surgical aftercare following surgery on the circulatory system: Secondary | ICD-10-CM

## 2013-08-17 DIAGNOSIS — M79661 Pain in right lower leg: Secondary | ICD-10-CM | POA: Insufficient documentation

## 2013-08-17 NOTE — Progress Notes (Signed)
     HPI: 48 y/o who is here for her 6 month follow up visit s/p right common iliac artery stenting.  She does report right calf pain after walking 1/2 mile.  The pain subsides with 10 min. Of rest.  She has no rest pain.  She and her daughter have started a walking program and she has cut down on her smoking to 6 cigarettes a day.  Her PCP has started her on Wellbutrin.     Current outpatient prescriptions:buPROPion (WELLBUTRIN SR) 200 MG 12 hr tablet, Take 200 mg by mouth 2 (two) times daily., Disp: , Rfl: ;  citalopram (CELEXA) 20 MG tablet, Take 40 mg by mouth at bedtime. , Disp: , Rfl: ;  clopidogrel (PLAVIX) 75 MG tablet, Take 1 tablet (75 mg total) by mouth daily., Disp: 30 tablet, Rfl: 11;  meclizine (ANTIVERT) 25 MG tablet, Take 25 mg by mouth 3 (three) times daily as needed. , Disp: , Rfl:  aspirin 81 MG tablet, Take 81 mg by mouth daily., Disp: , Rfl:   Review of Systems  Constitutional: Negative for fever and chills.  HENT: Negative for congestion.   Eyes: Negative for blurred vision and double vision.  Respiratory: Negative for cough and shortness of breath.   Gastrointestinal: Negative for nausea, vomiting and abdominal pain.  Musculoskeletal: Negative for joint pain.  Skin: Negative for rash.  Neurological: Negative for dizziness and headaches.  Psychiatric/Behavioral: Negative for depression.    Objective 110/60 80 98.4 F (36.9 C) (Oral) 18 92% Weight 165 lbs  General: [place] A & O x 3 Heart RRR Lungs CTA Abdomin: NTTP Vascular: palpable radial, femoral, DP/PT pulses equal bilaterally  Aorto-iliac duplex: Patent right common iliac artery stent  ABI: Triphasic wave forms bilateral Right 1.18 Left 1.09  Assessment/Planning: Atherosclerosis of native arteries of the extremities with intermittent claudication S/P right iliac artery stent She has mild intermittent calf pain on the right  She will continue her walking program, plavix, and smokin cessation. We  will see her back in 6 months for ABI's of bilateral LE. She was seen in clinic today by Dr. Pryor Montes, EMMA [facility] 08/17/2013 3:47 PM  Agree with above. She has almost quit tobacco completely. I've encouraged her to stay as active as possible. I'll see her back in 6 months with follow up ABIs. She knows to call sooner if she has problems.  [name], MD, [place] [phone] 08/17/2013

## 2013-08-18 NOTE — Addendum Note (Signed)
Addended by: Mena Goes on: 08/18/2013 09:45 AM   Modules accepted: Orders

## 2013-08-19 DIAGNOSIS — Z0279 Encounter for issue of other medical certificate: Secondary | ICD-10-CM

## 2013-12-19 DIAGNOSIS — Z0279 Encounter for issue of other medical certificate: Secondary | ICD-10-CM

## 2014-01-12 ENCOUNTER — Encounter (HOSPITAL_COMMUNITY): Payer: Self-pay | Admitting: Vascular Surgery

## 2014-01-31 ENCOUNTER — Other Ambulatory Visit: Payer: Self-pay | Admitting: Vascular Surgery

## 2014-01-31 ENCOUNTER — Other Ambulatory Visit: Payer: Self-pay | Admitting: *Deleted

## 2014-02-14 ENCOUNTER — Encounter: Payer: Self-pay | Admitting: Vascular Surgery

## 2014-02-15 ENCOUNTER — Ambulatory Visit (HOSPITAL_COMMUNITY)
Admission: RE | Admit: 2014-02-15 | Discharge: 2014-02-15 | Disposition: A | Discharge status: Home / Self Care | Payer: BLUE CROSS/BLUE SHIELD | Source: Ambulatory Visit | Attending: Vascular Surgery | Admitting: Vascular Surgery

## 2014-02-15 ENCOUNTER — Encounter: Payer: Self-pay | Admitting: Vascular Surgery

## 2014-02-15 ENCOUNTER — Ambulatory Visit (INDEPENDENT_AMBULATORY_CARE_PROVIDER_SITE_OTHER): Payer: BLUE CROSS/BLUE SHIELD | Admitting: Vascular Surgery

## 2014-02-15 VITALS — BP 117/77 | HR 79 | Ht 61.0 in | Wt 168.0 lb

## 2014-02-15 DIAGNOSIS — Z9862 Peripheral vascular angioplasty status: Secondary | ICD-10-CM

## 2014-02-15 DIAGNOSIS — I739 Peripheral vascular disease, unspecified: Secondary | ICD-10-CM | POA: Insufficient documentation

## 2014-02-15 DIAGNOSIS — Z48812 Encounter for surgical aftercare following surgery on the circulatory system: Secondary | ICD-10-CM | POA: Insufficient documentation

## 2014-02-15 DIAGNOSIS — Z9889 Other specified postprocedural states: Secondary | ICD-10-CM

## 2014-02-15 NOTE — Addendum Note (Signed)
Addended by: Mena Goes on: 02/15/2014 03:41 PM   Modules accepted: Orders

## 2014-02-15 NOTE — Progress Notes (Signed)
HISTORY AND PHYSICAL     CC:  6 month check up Referring Provider:  Dione Housekeeper, MD  HPI: This is a 49 y.o. female who returns for her 6 month follow up after right common iliac artery angioplasty and Genesis stent placement 7 x 24 mm on 01/10/13.  She states that she continues to have cramping in her right calf when she walks.  She can walk ~ 1/2 mile before she has to stop and rest.  She states that she does get right thigh numbness and has fallen a couple of times.  She denies having any back pain or trouble.   She does continue to smoke.  She does have a progressive plan to quit.  She is down to 4 cigarettes per day next week and will continue to decrease the amount each week.  She continues to be active and has been walking at [place] since it has been raining so much.    She states she does take a statin for her cholesterol and she is on Plavix, but is not on Aspirin.   Past Medical History  Diagnosis Date  . Claudication of lower extremity Nov. 2014    Right Lower Extremity rest pain  . Diabetes mellitus without complication   . Hypertension   . Anxiety   . Arthritis   . Depression   . Migraines   . Arterial occlusive disease Nov. 2014    Past Surgical History  Procedure Laterality Date  . Cholecystectomy      Gall Bladder  . Abdominal hysterectomy    . Iliac artery angioplasty and stent placement  01/10/13  . Abdominal aortagram N/A 01/10/2013    Procedure: ABDOMINAL AORTAGRAM;  Surgeon: Rosetta Posner, MD;  Location: [facility] CATH LAB;  Service: Cardiovascular;  Laterality: N/A;  . Percutaneous stent intervention Right 01/10/2013    Procedure: PERCUTANEOUS STENT INTERVENTION;  Surgeon: Rosetta Posner, MD;  Location: [facility] CATH LAB;  Service: Cardiovascular;  Laterality: Right;  rt common iliac stent  . Lower extremity angiogram Bilateral 01/10/2013    Procedure: LOWER EXTREMITY ANGIOGRAM;  Surgeon: Rosetta Posner, MD;  Location: [facility] CATH LAB;  Service: Cardiovascular;  Laterality:  Bilateral;    Allergies  Allergen Reactions  . Asa [Aspirin] Rash  . Codeine Rash  . Penicillins Rash    Current Outpatient Prescriptions  Medication Sig Dispense Refill  . aspirin 81 MG tablet Take 81 mg by mouth daily.    Marland Kitchen buPROPion (WELLBUTRIN SR) 200 MG 12 hr tablet Take 200 mg by mouth 2 (two) times daily.    . citalopram (CELEXA) 20 MG tablet Take 40 mg by mouth at bedtime.     . clopidogrel (PLAVIX) 75 MG tablet Take 1 tablet (75 mg total) by mouth daily. 30 tablet 11  . meclizine (ANTIVERT) 25 MG tablet Take 25 mg by mouth 3 (three) times daily as needed.      No current facility-administered medications for this visit.    Family History  Problem Relation Age of Onset  . Diabetes Mother   . Hyperlipidemia Mother   . Hypertension Mother   . Varicose Veins Mother   . Cancer Father   . Diabetes Brother   . Hypertension Brother   . Hyperlipidemia Brother     History   Social History  . Marital Status: Married    Spouse Name: N/A    Number of Children: N/A  . Years of Education: N/A   Occupational History  . Not on  file.   Social History Main Topics  . Smoking status: Current Every Day Smoker -- 0.50 packs/day    Types: Cigarettes  . Smokeless tobacco: Never Used     Comment: Also using vape E- cigs  . Alcohol Use: No  . Drug Use: No  . Sexual Activity: Not on file   Other Topics Concern  . Not on file   Social History Narrative     ROS: [x]  Positive   [ ]  Negative   [ ]  All sytems reviewed and are negative  Cardiovascular: []  chest pain/pressure []  palpitations []  SOB lying flat []  DOE [x]  pain in legs while walking [x]  pain in feet when lying flat []  hx of DVT []  hx of phlebitis [x]  swelling in legs [x]  varicose veins  Pulmonary: []  productive cough []  asthma []  wheezing  Neurologic: []  weakness in []  arms []  legs []  numbness in []  arms []  legs [] difficulty speaking or slurred speech []  temporary loss of vision in one eye []   dizziness  Hematologic: []  bleeding problems []  problems with blood clotting easily  GI []  vomiting blood []  blood in stool  GU: []  burning with urination []  blood in urine  Psychiatric: []  hx of major depression  Integumentary: []  rashes []  ulcers  Constitutional: []  fever []  chills   PHYSICAL EXAMINATION:  Filed Vitals:   02/15/14 1333  BP: 117/77  Pulse: 79   Body mass index is 31.76 kg/(m^2).  General:  WDWN in NAD Gait: Normal HENT: WNL, normocephalic Pulmonary: normal non-labored breathing , without Rales, rhonchi,  wheezing Cardiac: RRR, without  Murmurs, rubs or gallops; without carotid bruits Abdomen: obese Skin: without rashes, without ulcers  Vascular Exam/Pulses:  Right Left  Radial 2+ (normal) 2+ (normal)  Ulnar Unable to palpate  Unable to palpate   Popliteal Unable to palpate  Unable to palpate   DP 2+ (normal) 1+ (weak)  PT 2+ (normal) 2+ (normal)   Extremities: without ischemic changes, without Gangrene , without cellulitis; without open wounds;  Musculoskeletal: no muscle wasting or atrophy  Neurologic: A&O X 3; Appropriate Affect ; SENSATION: normal; MOTOR FUNCTION:  moving all extremities equally. Speech is fluent/normal   Non-Invasive Vascular Imaging:   ABI's 02/15/14: Right:  1.18 Left:  1.32  Previous ABI's on 08/17/13: Right:  1.18 Left:  1.09  Pt meds includes: Statin:  Yes.   Beta Blocker:  No. Aspirin:  No. ACEI:  No. ARB:  No. Other Antiplatelet/Anticoagulant:  Yes.   Plavix   ASSESSMENT/PLAN:: 49 y.o. female who is s/p right CIA stent December 2014.   -she continues to have right leg claudication after ~ 1/2 mile of walking.  She does have normal ABI's today and 2+ palpable DP/PT on the right. -Dr. Scot Dock feels that the numbness in her right thigh is unrelated to her CIA stenting. -she will continue to cut back on her cigarette smoking and continue her walking regimen -we will see her back in 6 months with  ABI's and a duplex of the right CIA   Leontine Locket, PA-C [facility] [phone]  Clinic MD:  Pt seen and examined in conjunction with Dr. Scot Dock

## 2014-03-13 DIAGNOSIS — Z0279 Encounter for issue of other medical certificate: Secondary | ICD-10-CM

## 2014-08-14 ENCOUNTER — Encounter: Payer: Self-pay | Admitting: Vascular Surgery

## 2014-08-16 ENCOUNTER — Encounter (HOSPITAL_COMMUNITY): Payer: PRIVATE HEALTH INSURANCE

## 2014-08-16 ENCOUNTER — Other Ambulatory Visit: Payer: Self-pay | Admitting: *Deleted

## 2014-08-16 ENCOUNTER — Ambulatory Visit: Payer: PRIVATE HEALTH INSURANCE | Admitting: Vascular Surgery

## 2014-08-16 DIAGNOSIS — Z9862 Peripheral vascular angioplasty status: Secondary | ICD-10-CM

## 2014-08-16 DIAGNOSIS — I70219 Atherosclerosis of native arteries of extremities with intermittent claudication, unspecified extremity: Secondary | ICD-10-CM

## 2014-09-19 ENCOUNTER — Encounter: Payer: Self-pay | Admitting: Vascular Surgery

## 2014-09-20 ENCOUNTER — Ambulatory Visit (INDEPENDENT_AMBULATORY_CARE_PROVIDER_SITE_OTHER): Payer: BLUE CROSS/BLUE SHIELD | Admitting: Vascular Surgery

## 2014-09-20 ENCOUNTER — Ambulatory Visit (INDEPENDENT_AMBULATORY_CARE_PROVIDER_SITE_OTHER)
Admission: RE | Admit: 2014-09-20 | Discharge: 2014-09-20 | Disposition: A | Discharge status: Home / Self Care | Payer: BLUE CROSS/BLUE SHIELD | Source: Ambulatory Visit | Attending: Vascular Surgery | Admitting: Vascular Surgery

## 2014-09-20 ENCOUNTER — Ambulatory Visit (HOSPITAL_COMMUNITY)
Admission: RE | Admit: 2014-09-20 | Discharge: 2014-09-20 | Disposition: A | Discharge status: Home / Self Care | Payer: BLUE CROSS/BLUE SHIELD | Source: Ambulatory Visit | Attending: Vascular Surgery | Admitting: Vascular Surgery

## 2014-09-20 ENCOUNTER — Encounter: Payer: Self-pay | Admitting: Vascular Surgery

## 2014-09-20 VITALS — BP 124/57 | HR 58 | Temp 97.9°F | Resp 18 | Ht 61.0 in | Wt 160.7 lb

## 2014-09-20 DIAGNOSIS — Z9889 Other specified postprocedural states: Secondary | ICD-10-CM | POA: Insufficient documentation

## 2014-09-20 DIAGNOSIS — Z9862 Peripheral vascular angioplasty status: Secondary | ICD-10-CM

## 2014-09-20 DIAGNOSIS — I739 Peripheral vascular disease, unspecified: Secondary | ICD-10-CM | POA: Diagnosis not present

## 2014-09-20 DIAGNOSIS — I70209 Unspecified atherosclerosis of native arteries of extremities, unspecified extremity: Secondary | ICD-10-CM

## 2014-09-20 DIAGNOSIS — I70219 Atherosclerosis of native arteries of extremities with intermittent claudication, unspecified extremity: Secondary | ICD-10-CM

## 2014-09-20 NOTE — Addendum Note (Signed)
Addended by: Dorthula Rue L on: 09/20/2014 05:40 PM   Modules accepted: Orders

## 2014-09-20 NOTE — Progress Notes (Signed)
[facility] [place]  Patient name: Hannah Mcdaniel MRN: [id number] DOB: 1965/02/08 Sex: female  REASON FOR VISIT: Follow up of peripheral vascular disease.  HPI: Hannah Mcdaniel is a 49 y.o. female who underwent placement of a right common iliac artery stent in 2014. She comes in for a 6 month follow up visit. She does complain of some pain in her thighs and calves that occurs all of the time. It is not associated with ambulation. She denies any calf claudication, rest pain, or nonhealing ulcers. She is on aspirin and is on her Plavix. She remains fairly active.  She does continue to smoke but is cut back to 5 cigarettes a day.  Past Medical History  Diagnosis Date  . Claudication of lower extremity Nov. 2014    Right Lower Extremity rest pain  . Diabetes mellitus without complication   . Hypertension   . Anxiety   . Arthritis   . Depression   . Migraines   . Arterial occlusive disease Nov. 2014   Family History  Problem Relation Age of Onset  . Diabetes Mother   . Hyperlipidemia Mother   . Hypertension Mother   . Varicose Veins Mother   . Cancer Father   . Diabetes Brother   . Hypertension Brother   . Hyperlipidemia Brother    SOCIAL HISTORY: Social History  Substance Use Topics  . Smoking status: Current Every Day Smoker -- 0.25 packs/day    Types: Cigarettes  . Smokeless tobacco: Never Used     Comment: Also using vape E- cigs  . Alcohol Use: No   Allergies  Allergen Reactions  . Asa [Aspirin] Rash  . Codeine Rash  . Penicillins Rash   Current Outpatient Prescriptions  Medication Sig Dispense Refill  . aspirin 81 MG tablet Take 81 mg by mouth daily.    Marland Kitchen buPROPion (WELLBUTRIN SR) 200 MG 12 hr tablet Take 200 mg by mouth 2 (two) times daily.    . citalopram (CELEXA) 20 MG tablet Take 40 mg by mouth at bedtime.     . clopidogrel (PLAVIX) 75 MG tablet Take 1 tablet (75 mg total) by mouth daily. 30 tablet 11  . LORazepam (ATIVAN) 0.5  MG tablet Take 0.5 mg by mouth 2 (two) times daily.    . meclizine (ANTIVERT) 25 MG tablet Take 25 mg by mouth 3 (three) times daily as needed.      No current facility-administered medications for this visit.   REVIEW OF SYSTEMS: Valu.Nieves ] denotes positive finding; [  ] denotes negative finding  CARDIOVASCULAR:  [ ]  chest pain   [ ]  chest pressure   [ ]  palpitations   [ ]  orthopnea   [ ]  dyspnea on exertion   [ ]  claudication   [ ]  rest pain   [ ]  DVT   [ ]  phlebitis PULMONARY:   [ ]  productive cough   [ ]  asthma   [ ]  wheezing NEUROLOGIC:   [ ]  weakness  [ ]  paresthesias  [ ]  aphasia  [ ]  amaurosis  [ ]  dizziness HEMATOLOGIC:   [ ]  bleeding problems   [ ]  clotting disorders MUSCULOSKELETAL:  [ ]  joint pain   [ ]  joint swelling [ ]  leg swelling GASTROINTESTINAL: [ ]   blood in stool  [ ]   hematemesis GENITOURINARY:  [ ]   dysuria  [ ]   hematuria PSYCHIATRIC:  [ ]  history of major depression INTEGUMENTARY:  [ ]  rashes  [ ]   ulcers CONSTITUTIONAL:  [ ]  fever   [ ]  chills  PHYSICAL EXAM: Filed Vitals:   09/20/14 1151  BP: 124/57  Pulse: 58  Temp: 97.9 F (36.6 C)  TempSrc: Oral  Resp: 18  Height: 5\' 1"  (1.549 m)  Weight: 160 lb 11.2 oz (72.893 kg)  SpO2: 100%   GENERAL: The patient is a well-nourished female, in no acute distress. The vital signs are documented above. CARDIAC: There is a regular rate and rhythm.  VASCULAR: I do not detect carotid bruits. She has palpable femoral pulses. I cannot palpate pedal pulses although both feet are warm and well-perfused. PULMONARY: There is good air exchange bilaterally without wheezing or rales. ABDOMEN: Soft and non-tender with normal pitched bowel sounds.  MUSCULOSKELETAL: There are no major deformities or cyanosis. NEUROLOGIC: No focal weakness or paresthesias are detected. SKIN: There are no ulcers or rashes noted. PSYCHIATRIC: The patient has a normal affect.  DATA:  I have independently interpreted her duplex scan today which shows that  her right common iliac artery stent is widely patent with no areas of stenosis identified. There is biphasic Doppler signals throughout the iliac system. ABI on the right is 100% an ABI on the left is 100%.  MEDICAL ISSUES: STATUS POST RIGHT COMMON ILIAC ARTERY STENT: Her right common iliac artery stent is widely patent and she has a normal ABI on the right. I've ordered a follow up study in 1 year and I'll see her back that time. She is on aspirin and is on Plavix. She plans on discussing taking Pravachol with her primary care physician. She did not like Crestor. I've encouraged her to stay as active as possible and continue to try to work on getting off cigarettes completely.   Return in about 1 year (around 09/20/2015).   [name] [facility] [place]: [phone]

## 2015-02-28 ENCOUNTER — Other Ambulatory Visit: Payer: Self-pay | Admitting: *Deleted

## 2015-02-28 DIAGNOSIS — I739 Peripheral vascular disease, unspecified: Secondary | ICD-10-CM

## 2015-02-28 MED ORDER — CLOPIDOGREL BISULFATE 75 MG PO TABS
75.0000 mg | ORAL_TABLET | Freq: Every day | ORAL | Status: DC
Start: 2015-02-28 — End: 2016-03-02

## 2015-07-24 DIAGNOSIS — F411 Generalized anxiety disorder: Secondary | ICD-10-CM | POA: Insufficient documentation

## 2015-09-26 ENCOUNTER — Ambulatory Visit: Payer: PRIVATE HEALTH INSURANCE | Admitting: Vascular Surgery

## 2015-09-26 ENCOUNTER — Encounter (HOSPITAL_COMMUNITY): Payer: PRIVATE HEALTH INSURANCE

## 2015-09-26 ENCOUNTER — Other Ambulatory Visit (HOSPITAL_COMMUNITY): Payer: PRIVATE HEALTH INSURANCE

## 2015-11-15 ENCOUNTER — Encounter: Payer: Self-pay | Admitting: Vascular Surgery

## 2015-11-21 ENCOUNTER — Encounter: Payer: Self-pay | Admitting: Vascular Surgery

## 2015-11-21 ENCOUNTER — Ambulatory Visit (INDEPENDENT_AMBULATORY_CARE_PROVIDER_SITE_OTHER): Payer: BLUE CROSS/BLUE SHIELD | Admitting: Vascular Surgery

## 2015-11-21 ENCOUNTER — Encounter: Payer: Self-pay | Admitting: *Deleted

## 2015-11-21 ENCOUNTER — Ambulatory Visit (HOSPITAL_COMMUNITY)
Admission: RE | Admit: 2015-11-21 | Discharge: 2015-11-21 | Disposition: A | Discharge status: Home / Self Care | Payer: BLUE CROSS/BLUE SHIELD | Source: Ambulatory Visit | Attending: Vascular Surgery | Admitting: Vascular Surgery

## 2015-11-21 ENCOUNTER — Other Ambulatory Visit: Payer: Self-pay | Admitting: *Deleted

## 2015-11-21 DIAGNOSIS — I70209 Unspecified atherosclerosis of native arteries of extremities, unspecified extremity: Secondary | ICD-10-CM | POA: Diagnosis present

## 2015-11-21 NOTE — Progress Notes (Signed)
[facility] [place]  Patient name: Hannah Mcdaniel MRN: [id number] DOB: [birth year] Sex: female  REASON FOR VISIT: follow-up PAD  HPI: Hannah Mcdaniel is a 50 y.o. female who presents for continued follow-up of her peripheral arterial disease. Status post right common iliac artery stent in 2014. Last year, her stent was widely patent without any evidence of stenosis. She complained of some right thigh and calf pain that was not felt to be related to ambulation.  Today, she continues to have the right groin pain and calf pain. She describes it as a burning sensation. This occurs with rest and with walking. She has been walking approximately 45 minutes a day. She has to stop secondary to right calf claudication after 20 minutes. She continues to smoke. She is down to 5 cigarettes per day. Her goal is to go to 4 cigarettes a day by next week. She is on Plavix. She was taken off aspirin by her PCP. She is on Crestor and fenofibrate acid. She denies any rest pain or nonhealing wounds.  Past Medical History:  Diagnosis Date  . Anxiety   . Arterial occlusive disease ([place]) Nov. 2014  . Arthritis   . Claudication of lower extremity [facility]) Nov. 2014   Right Lower Extremity rest pain  . Depression   . Diabetes mellitus without complication ([place])   . Hypertension   . Migraines     Family History  Problem Relation Age of Onset  . Diabetes Mother   . Hyperlipidemia Mother   . Hypertension Mother   . Varicose Veins Mother   . Cancer Father   . Diabetes Brother   . Hypertension Brother   . Hyperlipidemia Brother     SOCIAL HISTORY: Social History  Substance Use Topics  . Smoking status: Current Every Day Smoker    Packs/day: 0.25    Types: Cigarettes  . Smokeless tobacco: Never Used     Comment: Also using vape E- cigs  . Alcohol use No    Allergies  Allergen Reactions  . Influenza Vaccines Other (See Comments)    Sensitive to vaccine per pt.  Diona Fanti  [Aspirin] Rash  . Codeine Rash  . Penicillins Rash    Current Outpatient Prescriptions  Medication Sig Dispense Refill  . aspirin 81 MG tablet Take 81 mg by mouth daily.    Marland Kitchen buPROPion (WELLBUTRIN SR) 200 MG 12 hr tablet Take 200 mg by mouth 2 (two) times daily.    . Choline Fenofibrate (FENOFIBRIC ACID) 135 MG CPDR TAKE ONE CAPSULE (135 MG TOTAL) BY MOUTH DAILY.    . citalopram (CELEXA) 20 MG tablet Take 40 mg by mouth at bedtime.     . clopidogrel (PLAVIX) 75 MG tablet Take 1 tablet (75 mg total) by mouth daily. 30 tablet 11  . LORazepam (ATIVAN) 0.5 MG tablet Take 0.5 mg by mouth 2 (two) times daily.    . meclizine (ANTIVERT) 25 MG tablet Take 25 mg by mouth 3 (three) times daily as needed.     . metFORMIN (GLUCOPHAGE) 500 MG tablet Take by mouth.     No current facility-administered medications for this visit.     REVIEW OF SYSTEMS:  [X]  denotes positive finding, [ ]  denotes negative finding Cardiac  Comments:  Chest pain or chest pressure:    Shortness of breath upon exertion:    Short of breath when lying flat:    Irregular heart rhythm:        Vascular  Pain in calf, thigh, or hip brought on by ambulation: x   Pain in feet at night that wakes you up from your sleep:     Blood clot in your veins:    Leg swelling:         Pulmonary    Oxygen at home:    Productive cough:     Wheezing:         Neurologic    Sudden weakness in arms or legs:     Sudden numbness in arms or legs:     Sudden onset of difficulty speaking or slurred speech:    Temporary loss of vision in one eye:     Problems with dizziness:         Gastrointestinal    Blood in stool:     Vomited blood:         Genitourinary    Burning when urinating:     Blood in urine:        Psychiatric    Major depression:         Hematologic    Bleeding problems:    Problems with blood clotting too easily:        Skin    Rashes or ulcers:        Constitutional    Fever or chills:      PHYSICAL  EXAM: Vitals:   11/21/15 1349  BP: 117/71  Pulse: 71  Resp: 16  Temp: 97.5 F (36.4 C)  TempSrc: Oral  SpO2: 96%  Weight: 163 lb (73.9 kg)  Height: 5\' 1"  (1.549 m)    GENERAL: The patient is a well-nourished female, in no acute distress. The vital signs are documented above. CARDIAC: There is a regular rate and rhythm. No carotid bruits. VASCULAR: 2+ femoral pulses bilaterally. Palpable left DP and PT pulses. Trace right DP pulse. PULMONARY: There is good air exchange bilaterally without wheezing or rales. ABDOMEN: Soft and non-tender with normal pitched bowel sounds.  MUSCULOSKELETAL: There are no major deformities or cyanosis. Left ankle with palpable perforator vein. NEUROLOGIC: No focal weakness or paresthesias are detected. SKIN: There are no ulcers or rashes noted. PSYCHIATRIC: The patient has a normal affect.  DATA:  Iliac artery stent duplex 11/21/2015  There is evidence of proximal and in-stent stenosis of the right common iliac artery stent. Velocities are in the mid 400s.  ABIs 11/21/2015  1.04 on the right with biphasic waveforms 1.22 on the left with triphasic waveforms  MEDICAL ISSUES:  Peripheral arterial disease status post right common iliac artery stent  The patient has some evidence of proximal and in stent stenosis of the right common iliac artery stent on today's duplex. Her ABIs today are 100% bilaterally. Given the significant increase in velocities, feel like it would be reasonable to further evaluate her stent. We'll plan for lower extremity arteriogram with possible intervention on 11/26/2015 with Dr. Scot Dock.  She is keeping active and walking 45 minutes a day. Encouraged her to try to quit smoking. She is on Plavix and a statin. Discussed that she may continue taking aspirin. She continues to have right groin and calf burning pain that is felt to be unrelated to blood flow issues as these are present at rest and with walking.  Virgina Jock,  PA-C [facility] [place]  I have interviewed the patient and examined the patient. I agree with the findings by the PA. The patient appears to have a significant stenosis just above the  stent or at the proximal stent on the right. Given the risk of progression and thrombosis I recommended that we proceed with arteriography and possible repeat angioplasty and stenting. We have discussed the indications of the procedure and the potential complications. She is agreeable to proceed and all of her questions were answered. This has been scheduled for 11/26/2015.  Gae Gallop, MD [phone]

## 2015-11-26 ENCOUNTER — Telehealth: Payer: Self-pay | Admitting: Vascular Surgery

## 2015-11-26 ENCOUNTER — Encounter (HOSPITAL_COMMUNITY)
Admission: RE | Disposition: A | Discharge status: Home / Self Care | Payer: Self-pay | Source: Ambulatory Visit | Attending: Vascular Surgery

## 2015-11-26 ENCOUNTER — Ambulatory Visit (HOSPITAL_COMMUNITY)
Admission: RE | Admit: 2015-11-26 | Discharge: 2015-11-26 | Disposition: A | Discharge status: Home / Self Care | Payer: BLUE CROSS/BLUE SHIELD | Source: Ambulatory Visit | Attending: Vascular Surgery | Admitting: Vascular Surgery

## 2015-11-26 DIAGNOSIS — I70211 Atherosclerosis of native arteries of extremities with intermittent claudication, right leg: Secondary | ICD-10-CM | POA: Insufficient documentation

## 2015-11-26 DIAGNOSIS — Z880 Allergy status to penicillin: Secondary | ICD-10-CM | POA: Diagnosis not present

## 2015-11-26 DIAGNOSIS — Z8249 Family history of ischemic heart disease and other diseases of the circulatory system: Secondary | ICD-10-CM | POA: Insufficient documentation

## 2015-11-26 DIAGNOSIS — F17210 Nicotine dependence, cigarettes, uncomplicated: Secondary | ICD-10-CM | POA: Insufficient documentation

## 2015-11-26 DIAGNOSIS — I10 Essential (primary) hypertension: Secondary | ICD-10-CM | POA: Diagnosis not present

## 2015-11-26 DIAGNOSIS — E1151 Type 2 diabetes mellitus with diabetic peripheral angiopathy without gangrene: Secondary | ICD-10-CM | POA: Insufficient documentation

## 2015-11-26 DIAGNOSIS — Z833 Family history of diabetes mellitus: Secondary | ICD-10-CM | POA: Insufficient documentation

## 2015-11-26 DIAGNOSIS — G43909 Migraine, unspecified, not intractable, without status migrainosus: Secondary | ICD-10-CM | POA: Diagnosis not present

## 2015-11-26 DIAGNOSIS — F329 Major depressive disorder, single episode, unspecified: Secondary | ICD-10-CM | POA: Insufficient documentation

## 2015-11-26 DIAGNOSIS — Z7902 Long term (current) use of antithrombotics/antiplatelets: Secondary | ICD-10-CM | POA: Insufficient documentation

## 2015-11-26 DIAGNOSIS — Z7982 Long term (current) use of aspirin: Secondary | ICD-10-CM | POA: Diagnosis not present

## 2015-11-26 DIAGNOSIS — F419 Anxiety disorder, unspecified: Secondary | ICD-10-CM | POA: Insufficient documentation

## 2015-11-26 DIAGNOSIS — T82856A Stenosis of peripheral vascular stent, initial encounter: Secondary | ICD-10-CM | POA: Diagnosis not present

## 2015-11-26 DIAGNOSIS — Y812 Prosthetic and other implants, materials and accessory general- and plastic-surgery devices associated with adverse incidents: Secondary | ICD-10-CM | POA: Diagnosis not present

## 2015-11-26 DIAGNOSIS — Z7984 Long term (current) use of oral hypoglycemic drugs: Secondary | ICD-10-CM | POA: Insufficient documentation

## 2015-11-26 DIAGNOSIS — I70209 Unspecified atherosclerosis of native arteries of extremities, unspecified extremity: Secondary | ICD-10-CM

## 2015-11-26 DIAGNOSIS — M1990 Unspecified osteoarthritis, unspecified site: Secondary | ICD-10-CM | POA: Diagnosis not present

## 2015-11-26 HISTORY — PX: PERIPHERAL VASCULAR CATHETERIZATION: SHX172C

## 2015-11-26 LAB — POCT ACTIVATED CLOTTING TIME
ACTIVATED CLOTTING TIME: 235 s
Activated Clotting Time: 202 seconds
Activated Clotting Time: 169 seconds

## 2015-11-26 LAB — POCT I-STAT, CHEM 8
BUN: 15 mg/dL (ref 6–20)
CHLORIDE: 108 mmol/L (ref 101–111)
Calcium, Ion: 1.16 mmol/L (ref 1.15–1.40)
Creatinine, Ser: 0.7 mg/dL (ref 0.44–1.00)
Glucose, Bld: 132 mg/dL — ABNORMAL HIGH (ref 65–99)
HEMATOCRIT: 38 % (ref 36.0–46.0)
HEMOGLOBIN: 12.9 g/dL (ref 12.0–15.0)
POTASSIUM: 3.7 mmol/L (ref 3.5–5.1)
SODIUM: 143 mmol/L (ref 135–145)
TCO2: 25 mmol/L (ref 0–100)

## 2015-11-26 LAB — GLUCOSE, CAPILLARY: GLUCOSE-CAPILLARY: 113 mg/dL — AB (ref 65–99)

## 2015-11-26 SURGERY — ABDOMINAL AORTOGRAM W/LOWER EXTREMITY
Laterality: Right

## 2015-11-26 MED ORDER — HEPARIN SODIUM (PORCINE) 1000 UNIT/ML IJ SOLN
INTRAMUSCULAR | Status: DC | PRN
  Administered 2015-11-26: 6000 [IU] via INTRAVENOUS

## 2015-11-26 MED ORDER — IODIXANOL 320 MG/ML IV SOLN
INTRAVENOUS | Status: DC | PRN
  Administered 2015-11-26: 135 mL via INTRA_ARTERIAL

## 2015-11-26 MED ORDER — HEPARIN (PORCINE) IN NACL 2-0.9 UNIT/ML-% IJ SOLN
INTRAMUSCULAR | Status: DC | PRN
  Administered 2015-11-26: 1000 mL

## 2015-11-26 MED ORDER — HEPARIN SODIUM (PORCINE) 1000 UNIT/ML IJ SOLN
INTRAMUSCULAR | Status: AC
  Filled 2015-11-26: qty 1

## 2015-11-26 MED ORDER — FENTANYL CITRATE (PF) 100 MCG/2ML IJ SOLN
INTRAMUSCULAR | Status: DC | PRN
  Administered 2015-11-26: 50 ug via INTRAVENOUS

## 2015-11-26 MED ORDER — NITROGLYCERIN 1 MG/10 ML FOR IR/CATH LAB
INTRA_ARTERIAL | Status: AC
  Filled 2015-11-26: qty 10

## 2015-11-26 MED ORDER — MIDAZOLAM HCL 2 MG/2ML IJ SOLN
INTRAMUSCULAR | Status: DC | PRN
  Administered 2015-11-26: 1 mg via INTRAVENOUS

## 2015-11-26 MED ORDER — HEPARIN (PORCINE) IN NACL 2-0.9 UNIT/ML-% IJ SOLN
INTRAMUSCULAR | Status: AC
  Filled 2015-11-26: qty 1000

## 2015-11-26 MED ORDER — NITROGLYCERIN 1 MG/10 ML FOR IR/CATH LAB
INTRA_ARTERIAL | Status: DC | PRN
  Administered 2015-11-26: 200 ug via INTRA_ARTERIAL

## 2015-11-26 MED ORDER — SODIUM CHLORIDE 0.9 % IV SOLN
INTRAVENOUS | Status: DC
  Administered 2015-11-26: 06:00:00 via INTRAVENOUS

## 2015-11-26 MED ORDER — SODIUM CHLORIDE 0.9 % IV SOLN: 1.0000 mL/kg/h | INTRAVENOUS | Status: DC

## 2015-11-26 MED ORDER — LIDOCAINE HCL (PF) 1 % IJ SOLN
INTRAMUSCULAR | Status: AC
  Filled 2015-11-26: qty 30

## 2015-11-26 MED ORDER — MIDAZOLAM HCL 2 MG/2ML IJ SOLN
INTRAMUSCULAR | Status: AC
  Filled 2015-11-26: qty 2

## 2015-11-26 MED ORDER — LIDOCAINE HCL (PF) 1 % IJ SOLN
INTRAMUSCULAR | Status: DC | PRN
  Administered 2015-11-26: 15 mL

## 2015-11-26 MED ORDER — FENTANYL CITRATE (PF) 100 MCG/2ML IJ SOLN
INTRAMUSCULAR | Status: AC
  Filled 2015-11-26: qty 2

## 2015-11-26 SURGICAL SUPPLY — 16 items
BALLN ARMADA 7X20X80 (BALLOONS) ×4
BALLOON ARMADA 7X20X80 (BALLOONS) IMPLANT
CATH ANGIO 5F PIGTAIL 65CM (CATHETERS) ×2 IMPLANT
CATH STRAIGHT 5FR 65CM (CATHETERS) ×2 IMPLANT
COVER PRB 48X5XTLSCP FOLD TPE (BAG) IMPLANT
COVER PROBE 5X48 (BAG) ×4
DEVICE CONTINUOUS FLUSH (MISCELLANEOUS) ×2 IMPLANT
KIT ENCORE 26 ADVANTAGE (KITS) ×2 IMPLANT
KIT MICROINTRODUCER STIFF 5F (SHEATH) ×2 IMPLANT
KIT PV (KITS) ×4 IMPLANT
SHEATH BRITE TIP 6FR 35CM (SHEATH) ×2 IMPLANT
SHEATH PINNACLE 5F 10CM (SHEATH) ×2 IMPLANT
SYR MEDRAD MARK V 150ML (SYRINGE) ×4 IMPLANT
TRANSDUCER W/STOPCOCK (MISCELLANEOUS) ×4 IMPLANT
TRAY PV CATH (CUSTOM PROCEDURE TRAY) ×4 IMPLANT
WIRE HITORQ VERSACORE ST 145CM (WIRE) ×2 IMPLANT

## 2015-11-26 NOTE — Discharge Instructions (Signed)
Groin Surgical Site Care Refer to this sheet in the next few weeks. These instructions provide you with information about caring for yourself after your procedure. Your health care provider may also give you more specific instructions. Your treatment has been planned according to current medical practices, but problems sometimes occur. Call your health care provider if you have any problems or questions after your procedure. WHAT TO EXPECT AFTER THE PROCEDURE After your procedure, it is typical to have the following:  Bruising at the groin site that usually fades within 1-2 weeks.  Blood collecting in the tissue (hematoma) that may be painful to the touch. It should usually decrease in size and tenderness within 1-2 weeks. HOME CARE INSTRUCTIONS  Take medicines only as directed by your health care provider. You may shower 24-48 hours after the procedure or as directed by your health care provider. Remove the bandAngiogram, Care After Refer to this sheet in the next few weeks. These instructions provide you with information about caring for yourself after your procedure. Your health care provider may also give you more specific instructions. Your treatment has been planned according to current medical practices, but problems sometimes occur. Call your health care provider if you have any problems or questions after your procedure. WHAT TO EXPECT AFTER THE PROCEDURE After your procedure, it is typical to have the following:  Bruising at the catheter insertion site that usually fades within 1-2 weeks.  Blood collecting in the tissue (hematoma) that may be painful to the touch. It should usually decrease in size and tenderness within 1-2 weeks. HOME CARE INSTRUCTIONS  Take medicines only as directed by your health care provider.  You may shower 24-48 hours after the procedure or as directed by your health care provider. Remove the bandage (dressing) and gently wash the site with plain soap and  water. Pat the area dry with a clean towel. Do not rub the site, because this may cause bleeding.  Do not take baths, swim, or use a hot tub until your health care provider approves.  Check your insertion site every day for redness, swelling, or drainage.  Do not apply powder or lotion to the site.  Do not lift over 10 lb (4.5 kg) for 5 days after your procedure or as directed by your health care provider.  Ask your health care provider when it is okay to:  Return to work or school.  Resume usual physical activities or sports.  Resume sexual activity.  Do not drive home if you are discharged the same day as the procedure. Have someone else drive you.  You may drive 24 hours after the procedure unless otherwise instructed by your health care provider.  Do not operate machinery or power tools for 24 hours after the procedure or as directed by your health care provider.  If your procedure was done as an outpatient procedure, which means that you went home the same day as your procedure, a responsible adult should be with you for the first 24 hours after you arrive home.  Keep all follow-up visits as directed by your health care provider. This is important. SEEK MEDICAL CARE IF:  You have a fever.  You have chills.  You have increased bleeding from the catheter insertion site. Hold pressure on the site. SEEK IMMEDIATE MEDICAL CARE IF:  You have unusual pain at the catheter insertion site.  You have redness, warmth, or swelling at the catheter insertion site.  You have drainage (other than a small amount  of blood on the dressing) from the catheter insertion site.  The catheter insertion site is bleeding, and the bleeding does not stop after 30 minutes of holding steady pressure on the site.  The area near or just beyond the catheter insertion site becomes pale, cool, tingly, or numb.   This information is not intended to replace advice given to you by your health care  provider. Make sure you discuss any questions you have with your health care provider.   Document Released: 08/08/2004 Document Revised: 02/10/2014 Document Reviewed: 06/23/2012 Elsevier Interactive Patient Education [employer].  age (dressing) and gently wash the site with plain soap and water. Pat the area dry with a clean towel. Do not rub the site, because this may cause bleeding.  Do not take baths, swim, or use a hot tub until your health care provider approves.  Check your insertion site every day for redness, swelling, or drainage.  Do not apply powder or lotion to the site.  Limit use of stairs to twice a day for the first 2-3 days or as directed by your health care provider.  Do not squat for the first 2-3 days or as directed by your health care provider.  Do not lift over 10 lb (4.5 kg) for 5 days after your procedure or as directed by your health care provider.  Ask your health care provider when it is okay to:  Return to work or school.  Resume usual physical activities or sports.  Resume sexual activity.  Do not drive home if you are discharged the same day as the procedure. Have someone else drive you.  You may drive 24 hours after the procedure unless otherwise instructed by your health care provider.  Do not operate machinery or power tools for 24 hours after the procedure or as directed by your health care provider.  If your procedure was done as an outpatient procedure, which means that you went home the same day as your procedure, a responsible adult should be with you for the first 24 hours after you arrive home.  Keep all follow-up visits as directed by your health care provider. This is important. SEEK MEDICAL CARE IF:  You have a fever.  You have chills.  You have increased bleeding from the groin site. Hold pressure on the site. SEEK IMMEDIATE MEDICAL CARE IF:  You have unusual pain at the groin site.  You have redness, warmth, or  swelling at the groin site.  You have drainage (other than a small amount of blood on the dressing) from the groin site.  The groin site is bleeding, and the bleeding does not stop after 30 minutes of holding steady pressure on the site.  Your leg or foot becomes pale, cool, tingly, or numb.   This information is not intended to replace advice given to you by your health care provider. Make sure you discuss any questions you have with your health care provider.   Document Released: 09/23/2013 Document Reviewed: 09/23/2013 Elsevier Interactive Patient Education [employer].

## 2015-11-26 NOTE — H&P (View-Only) (Signed)
[facility] [place]  Patient name: Hannah Mcdaniel MRN: [id number] DOB: [birth year] Sex: female  REASON FOR VISIT: follow-up PAD  HPI: Hannah Mcdaniel is a 50 y.o. female who presents for continued follow-up of her peripheral arterial disease. Status post right common iliac artery stent in 2014. Last year, her stent was widely patent without any evidence of stenosis. She complained of some right thigh and calf pain that was not felt to be related to ambulation.  Today, she continues to have the right groin pain and calf pain. She describes it as a burning sensation. This occurs with rest and with walking. She has been walking approximately 45 minutes a day. She has to stop secondary to right calf claudication after 20 minutes. She continues to smoke. She is down to 5 cigarettes per day. Her goal is to go to 4 cigarettes a day by next week. She is on Plavix. She was taken off aspirin by her PCP. She is on Crestor and fenofibrate acid. She denies any rest pain or nonhealing wounds.  Past Medical History:  Diagnosis Date  . Anxiety   . Arterial occlusive disease ([place]) Nov. 2014  . Arthritis   . Claudication of lower extremity [facility]) Nov. 2014   Right Lower Extremity rest pain  . Depression   . Diabetes mellitus without complication ([place])   . Hypertension   . Migraines     Family History  Problem Relation Age of Onset  . Diabetes Mother   . Hyperlipidemia Mother   . Hypertension Mother   . Varicose Veins Mother   . Cancer Father   . Diabetes Brother   . Hypertension Brother   . Hyperlipidemia Brother     SOCIAL HISTORY: Social History  Substance Use Topics  . Smoking status: Current Every Day Smoker    Packs/day: 0.25    Types: Cigarettes  . Smokeless tobacco: Never Used     Comment: Also using vape E- cigs  . Alcohol use No    Allergies  Allergen Reactions  . Influenza Vaccines Other (See Comments)    Sensitive to vaccine per pt.  Diona Fanti  [Aspirin] Rash  . Codeine Rash  . Penicillins Rash    Current Outpatient Prescriptions  Medication Sig Dispense Refill  . aspirin 81 MG tablet Take 81 mg by mouth daily.    Marland Kitchen buPROPion (WELLBUTRIN SR) 200 MG 12 hr tablet Take 200 mg by mouth 2 (two) times daily.    . Choline Fenofibrate (FENOFIBRIC ACID) 135 MG CPDR TAKE ONE CAPSULE (135 MG TOTAL) BY MOUTH DAILY.    . citalopram (CELEXA) 20 MG tablet Take 40 mg by mouth at bedtime.     . clopidogrel (PLAVIX) 75 MG tablet Take 1 tablet (75 mg total) by mouth daily. 30 tablet 11  . LORazepam (ATIVAN) 0.5 MG tablet Take 0.5 mg by mouth 2 (two) times daily.    . meclizine (ANTIVERT) 25 MG tablet Take 25 mg by mouth 3 (three) times daily as needed.     . metFORMIN (GLUCOPHAGE) 500 MG tablet Take by mouth.     No current facility-administered medications for this visit.     REVIEW OF SYSTEMS:  [X]  denotes positive finding, [ ]  denotes negative finding Cardiac  Comments:  Chest pain or chest pressure:    Shortness of breath upon exertion:    Short of breath when lying flat:    Irregular heart rhythm:        Vascular  Pain in calf, thigh, or hip brought on by ambulation: x   Pain in feet at night that wakes you up from your sleep:     Blood clot in your veins:    Leg swelling:         Pulmonary    Oxygen at home:    Productive cough:     Wheezing:         Neurologic    Sudden weakness in arms or legs:     Sudden numbness in arms or legs:     Sudden onset of difficulty speaking or slurred speech:    Temporary loss of vision in one eye:     Problems with dizziness:         Gastrointestinal    Blood in stool:     Vomited blood:         Genitourinary    Burning when urinating:     Blood in urine:        Psychiatric    Major depression:         Hematologic    Bleeding problems:    Problems with blood clotting too easily:        Skin    Rashes or ulcers:        Constitutional    Fever or chills:      PHYSICAL  EXAM: Vitals:   11/21/15 1349  BP: 117/71  Pulse: 71  Resp: 16  Temp: 97.5 F (36.4 C)  TempSrc: Oral  SpO2: 96%  Weight: 163 lb (73.9 kg)  Height: 5\' 1"  (1.549 m)    GENERAL: The patient is a well-nourished female, in no acute distress. The vital signs are documented above. CARDIAC: There is a regular rate and rhythm. No carotid bruits. VASCULAR: 2+ femoral pulses bilaterally. Palpable left DP and PT pulses. Trace right DP pulse. PULMONARY: There is good air exchange bilaterally without wheezing or rales. ABDOMEN: Soft and non-tender with normal pitched bowel sounds.  MUSCULOSKELETAL: There are no major deformities or cyanosis. Left ankle with palpable perforator vein. NEUROLOGIC: No focal weakness or paresthesias are detected. SKIN: There are no ulcers or rashes noted. PSYCHIATRIC: The patient has a normal affect.  DATA:  Iliac artery stent duplex 11/21/2015  There is evidence of proximal and in-stent stenosis of the right common iliac artery stent. Velocities are in the mid 400s.  ABIs 11/21/2015  1.04 on the right with biphasic waveforms 1.22 on the left with triphasic waveforms  MEDICAL ISSUES:  Peripheral arterial disease status post right common iliac artery stent  The patient has some evidence of proximal and in stent stenosis of the right common iliac artery stent on today's duplex. Her ABIs today are 100% bilaterally. Given the significant increase in velocities, feel like it would be reasonable to further evaluate her stent. We'll plan for lower extremity arteriogram with possible intervention on 11/26/2015 with Dr. Scot Dock.  She is keeping active and walking 45 minutes a day. Encouraged her to try to quit smoking. She is on Plavix and a statin. Discussed that she may continue taking aspirin. She continues to have right groin and calf burning pain that is felt to be unrelated to blood flow issues as these are present at rest and with walking.  Virgina Jock,  PA-C [facility] [place]  I have interviewed the patient and examined the patient. I agree with the findings by the PA. The patient appears to have a significant stenosis just above the  stent or at the proximal stent on the right. Given the risk of progression and thrombosis I recommended that we proceed with arteriography and possible repeat angioplasty and stenting. We have discussed the indications of the procedure and the potential complications. She is agreeable to proceed and all of her questions were answered. This has been scheduled for 11/26/2015.  Gae Gallop, MD [phone]

## 2015-11-26 NOTE — Interval H&P Note (Signed)
History and Physical Interval Note:  11/26/2015 7:22 AM  Hannah Mcdaniel  has presented today for surgery, with the diagnosis of claudication  The various methods of treatment have been discussed with the patient and family. After consideration of risks, benefits and other options for treatment, the patient has consented to  Procedure(s): Abdominal Aortogram w/Lower Extremity (N/A) as a surgical intervention .  The patient's history has been reviewed, patient examined, no change in status, stable for surgery.  I have reviewed the patient's chart and labs.  Questions were answered to the patient's satisfaction.     [name]

## 2015-11-26 NOTE — Telephone Encounter (Signed)
-----   Message from Mena Goes, RN sent at 11/26/2015  9:53 AM EDT ----- Regarding: 3 months    ----- Message ----- From: Angelia Mould, MD Sent: 11/26/2015   8:40 AM To: [facility] Charge Pool Subject: charge and f/u                                 PROCEDURE:  1. Ultrasound-guided access to the right common femoral artery 2. Aortogram with bilateral iliac arteriogram and bilateral lower extremity runoff 3. Angioplasty of recurrent right common iliac artery stenosis (7 mm x 2 cm balloon)  SURGEON: Judeth Cornfield. Scot Dock, MD, FACS  She will need a follow up visit in 3 months with ABIs and a duplex of her right common iliac artery stent. Thank you. CD

## 2015-11-26 NOTE — Op Note (Signed)
   PATIENT: Hannah Mcdaniel   MRN: [id number] DOB: [birth year]    DATE OF PROCEDURE: 11/26/2015  INDICATIONS: Hannah Mcdaniel is a 50 y.o. female who underwent a right common iliac artery stent (7 mm x 24 mm balloon expandable stent) in December 2014. On follow up duplex she had elevated velocities in the proximal stent and mid stent and is sent for arteriography and possible re-intervention.  PROCEDURE:  1. Ultrasound-guided access to the right common femoral artery 2. Aortogram with bilateral iliac arteriogram and bilateral lower extremity runoff 3. Angioplasty of recurrent right common iliac artery stenosis (7 mm x 2 cm balloon)  SURGEON: Judeth Cornfield. Scot Dock, MD, FACS  ANESTHESIA: Local with sedation   EBL: Minimal  TECHNIQUE: The patient was taken to the peripheral vascular lab and was sedated. The period of conscious sedation was 47 minutes.  During that time period, I was present face-to-face 100% of the time.  The patient was administered (1 mg of Versed and 50 g of fentanyl.). The patient's heart rate, blood pressure, and oxygen saturation were monitored by the nurse continuously during the procedure.  Both groins were prepped and draped in the usual sterile fashion. Under ultrasound guidance, after the skin was anesthetized, the right common femoral artery was cannulated with a micropuncture needle and a micropuncture sheath introduced over a wire. This was then exchanged for a 5 [place] sheath over a [employer] wire. The Patel catheter was positioned at the L1 vertebral body and flush aortogram obtained. The cath was positioned above the aortic bifurcation and an oblique iliac projection was obtained. Bilateral lower extremity runoff films were obtained.  The patient had a recurrent stenosis within the midportion of the stent approximate 70%. There was a 20 mmHg gradient at rest across this stenosis. I elected to address this with balloon angioplasty. The 5 French sheath was exchanged  for a long 6 [place] sheath. The patient was heparinized with 6000 units of IV heparin. ACT was 230. I selected a 7 mm x 2 cm balloon. This was positioned within the in-stent and inflated to rated burst pressure for 1 minute. Completion films showed no residual stenosis. The sheath was retracted into the external iliac artery. Patient was transferred to the holding area for removal of the sheath. No immediate competitions were noted.   FINDINGS:  1. No significant renal artery stenosis. Mild irregularity of the infrarenal aorta but no focal stenosis. 2. On the right side there was a recurrent stenosis within the right common iliac artery stent which was successfully ballooned as described above. The external iliac, hypogastric, common femoral, deep femoral, superficial femoral, popliteal, anterior tibial, and peroneal arteries are patent on the right. The right posterior tibial artery is occluded. 3. On the left side, the common iliac, external iliac artery hypogastric, common femoral, deep femoral, superficial femoral, popliteal, anterior tibial, and posterior tibial arteries are patent. The peroneal arteries occluded on the left.  PRE: 70% stenosis  with 20 mmHg gradient at rest.  POST: No residual stenosis  STENT: No  [name], MD, FACS [facility]  DATE OF DICTATION:   11/26/2015

## 2015-11-26 NOTE — Progress Notes (Signed)
Order for sheath removal verified per post procedural orders. Procedure explained to patient and right artery access site assessed: level 0, palpable dorsalis pedis and posterior tibial pulses. 6French Sheath removed and manual pressure applied for 20 minutes. Pre, peri, & post procedural vitals: HR 59, RR14, O2 Sat97%, BP 113/55, Pain level 0. Distal pulses remained intact after sheath removal. Access site level 0 and dressed with 4X4 gauze and tegaderm.  Hannah Mcdaniel, RCIS confirmed condition of site. Post procedural instructions discussed with return demonstration from patient. Bedrest starts @ 10am. Ends @ 2pm

## 2015-11-26 NOTE — Telephone Encounter (Signed)
LVM for appt, mailing letter US's and f/u 03/05/16

## 2015-11-27 ENCOUNTER — Encounter (HOSPITAL_COMMUNITY): Payer: Self-pay | Admitting: Vascular Surgery

## 2016-01-24 ENCOUNTER — Encounter: Payer: Self-pay | Admitting: Nurse Practitioner

## 2016-01-24 DIAGNOSIS — E785 Hyperlipidemia, unspecified: Secondary | ICD-10-CM | POA: Insufficient documentation

## 2016-01-24 DIAGNOSIS — E1169 Type 2 diabetes mellitus with other specified complication: Secondary | ICD-10-CM | POA: Insufficient documentation

## 2016-02-11 ENCOUNTER — Ambulatory Visit: Payer: BLUE CROSS/BLUE SHIELD | Admitting: Nurse Practitioner

## 2016-02-11 ENCOUNTER — Encounter: Payer: Self-pay | Admitting: Physician Assistant

## 2016-02-19 ENCOUNTER — Ambulatory Visit: Payer: BLUE CROSS/BLUE SHIELD | Admitting: Physician Assistant

## 2016-02-27 ENCOUNTER — Other Ambulatory Visit: Payer: Self-pay

## 2016-02-27 DIAGNOSIS — I739 Peripheral vascular disease, unspecified: Secondary | ICD-10-CM

## 2016-02-27 DIAGNOSIS — Z95828 Presence of other vascular implants and grafts: Secondary | ICD-10-CM

## 2016-03-02 ENCOUNTER — Other Ambulatory Visit: Payer: Self-pay | Admitting: Vascular Surgery

## 2016-03-02 DIAGNOSIS — I739 Peripheral vascular disease, unspecified: Secondary | ICD-10-CM

## 2016-03-03 ENCOUNTER — Encounter: Payer: Self-pay | Admitting: Vascular Surgery

## 2016-03-05 ENCOUNTER — Ambulatory Visit (HOSPITAL_COMMUNITY)
Admission: RE | Admit: 2016-03-05 | Discharge: 2016-03-05 | Disposition: A | Discharge status: Home / Self Care | Payer: BLUE CROSS/BLUE SHIELD | Source: Ambulatory Visit | Attending: Vascular Surgery | Admitting: Vascular Surgery

## 2016-03-05 ENCOUNTER — Ambulatory Visit (INDEPENDENT_AMBULATORY_CARE_PROVIDER_SITE_OTHER): Payer: BLUE CROSS/BLUE SHIELD | Admitting: Vascular Surgery

## 2016-03-05 ENCOUNTER — Encounter: Payer: Self-pay | Admitting: Vascular Surgery

## 2016-03-05 VITALS — BP 112/66 | HR 74 | Temp 98.2°F | Resp 20 | Ht 61.0 in | Wt 166.0 lb

## 2016-03-05 DIAGNOSIS — I739 Peripheral vascular disease, unspecified: Secondary | ICD-10-CM | POA: Insufficient documentation

## 2016-03-05 DIAGNOSIS — Z95828 Presence of other vascular implants and grafts: Secondary | ICD-10-CM

## 2016-03-05 DIAGNOSIS — I70209 Unspecified atherosclerosis of native arteries of extremities, unspecified extremity: Secondary | ICD-10-CM | POA: Diagnosis not present

## 2016-03-05 NOTE — Progress Notes (Signed)
Patient name: Hannah Mcdaniel MRN: [id number] DOB: 02/18/1965 Sex: female  REASON FOR VISIT: Follow up after angioplasty of a recurrent right common iliac artery stenosis.  HPI: Hannah Mcdaniel is a 51 y.o. female who underwent a right common iliac artery stent in December 2014. Follow up duplex scan showed elevated velocities in the proximal stent and mid stent and the patient underwent arteriography on 11/26/2015. This showed a recurrent stenosis in the right common iliac artery stent which was successfully ballooned with a 7 mm x 2 cm balloon with no residual stenosis. She comes in for a three-month follow up visit.  Since I saw her last she denies any history of claudication, rest pain, or nonhealing ulcers. She does have some right hip pain which may be arthritis. SHE DID QUIT SMOKING ON 01/25/2016.  Past Medical History:  Diagnosis Date  . Anxiety   . Arterial occlusive disease ([place]) Nov. 2014  . Arthritis   . Claudication of lower extremity [facility]) Nov. 2014   Right Lower Extremity rest pain  . Depression   . Diabetes mellitus without complication ([place])   . Hypertension   . Migraines     Family History  Problem Relation Age of Onset  . Diabetes Mother   . Hyperlipidemia Mother   . Hypertension Mother   . Varicose Veins Mother   . Cancer Father   . Diabetes Brother   . Hypertension Brother   . Hyperlipidemia Brother     SOCIAL HISTORY: Social History  Substance Use Topics  . Smoking status: Former Smoker    Types: Cigarettes    Quit date: 01/25/2016  . Smokeless tobacco: Never Used  . Alcohol use No    Allergies  Allergen Reactions  . Influenza Vaccines Other (See Comments)    Sensitive to vaccine per pt.  Diona Fanti [Aspirin] Rash  . Codeine Rash  . Penicillins Rash    Has patient had a PCN reaction causing immediate rash, facial/tongue/throat swelling, SOB or lightheadedness with hypotension:Yes Has patient had a PCN reaction causing severe rash involving  mucus membranes or skin necrosis:Yes Has patient had a PCN reaction that required hospitalization:No Has patient had a PCN reaction occurring within the last 10 years:No If all of the above answers are "NO", then may proceed with Cephalosporin use.     Current Outpatient Prescriptions  Medication Sig Dispense Refill  . aspirin 81 MG chewable tablet Chew 81 mg by mouth every evening.    . Choline Fenofibrate (FENOFIBRIC ACID) 135 MG CPDR TAKE ONE CAPSULE (135 MG TOTAL) BY MOUTH DAILY.    . citalopram (CELEXA) 40 MG tablet Take 40 mg by mouth at bedtime.  1  . clopidogrel (PLAVIX) 75 MG tablet TAKE 1 TABLET BY MOUTH EVERY DAY 30 tablet 11  . gabapentin (NEURONTIN) 100 MG capsule Take 300 mg by mouth at bedtime.  2  . LORazepam (ATIVAN) 0.5 MG tablet Take 1 mg by mouth 2 (two) times daily.     . meclizine (ANTIVERT) 25 MG tablet Take 25 mg by mouth 3 (three) times daily as needed for dizziness.     . metFORMIN (GLUCOPHAGE) 500 MG tablet Take 500 mg by mouth daily.     . rosuvastatin (CRESTOR) 40 MG tablet Take 40 mg by mouth at bedtime.  5   No current facility-administered medications for this visit.     REVIEW OF SYSTEMS:  [X]  denotes positive finding, [ ]  denotes negative finding Cardiac  Comments:  Chest pain or  chest pressure:    Shortness of breath upon exertion:    Short of breath when lying flat:    Irregular heart rhythm:        Vascular    Pain in calf, thigh, or hip brought on by ambulation: X   Pain in feet at night that wakes you up from your sleep:     Blood clot in your veins:    Leg swelling:         Pulmonary    Oxygen at home:    Productive cough:     Wheezing:         Neurologic    Sudden weakness in arms or legs:     Sudden numbness in arms or legs:     Sudden onset of difficulty speaking or slurred speech:    Temporary loss of vision in one eye:     Problems with dizziness:         Gastrointestinal    Blood in stool:     Vomited blood:           Genitourinary    Burning when urinating:     Blood in urine:        Psychiatric    Major depression:         Hematologic    Bleeding problems:    Problems with blood clotting too easily:        Skin    Rashes or ulcers:        Constitutional    Fever or chills:      PHYSICAL EXAM: Vitals:   03/05/16 1038  BP: 112/66  Pulse: 74  Resp: 20  Temp: 98.2 F (36.8 C)  TempSrc: Oral  SpO2: 98%  Weight: 166 lb (75.3 kg)  Height: 5\' 1"  (1.549 m)    GENERAL: The patient is a well-nourished female, in no acute distress. The vital signs are documented above. CARDIAC: There is a regular rate and rhythm.  VASCULAR: I do not detect carotid bruits. She has palpable femoral pulses and palpable posterior tibial pulses bilaterally. PULMONARY: There is good air exchange bilaterally without wheezing or rales. ABDOMEN: Soft and non-tender with normal pitched bowel sounds.  MUSCULOSKELETAL: There are no major deformities or cyanosis. NEUROLOGIC: No focal weakness or paresthesias are detected. SKIN: There are no ulcers or rashes noted. PSYCHIATRIC: The patient has a normal affect.  DATA:   BILATERAL LOWER EXTREMITY ARTERIAL DOPPLER STUDY: I have been apparently interpreted her bilateral lower extremity arterial Doppler study.  On the right side, there is a triphasic posterior tibial signal with a biphasic dorsalis pedis signal. ABI is 100%.  On the left side, there is a triphasic dorsalis pedis and posterior tibial signal with an ABI of 100%.  ARTERIAL DUPLEX: Leanna Sato a Pilling interpreted her duplex of the right iliac artery. There is triphasic Doppler signals throughout the stented area and no areas of increased velocity noted.  MEDICAL ISSUES:  STATUS POST REDO RIGHT COMMON ILIAC ARTERY ANGIOPLASTY: Her stent is widely patent. She has a normal ABI and a palpable pedal pulse. She has quit smoking now. She is on aspirin, a statin, and Plavix. I've encouraged her to stay as active as  possible. I'll see her back in 6 months with a duplex and ABIs at that time. She knows to call sooner if she has problems.  [name] [facility] [place] [phone]

## 2016-03-11 NOTE — Addendum Note (Signed)
Addended by: Lianne Cure A on: 03/11/2016 01:40 PM   Modules accepted: Orders

## 2016-03-22 LAB — HM DIABETES EYE EXAM

## 2016-04-29 DIAGNOSIS — G43001 Migraine without aura, not intractable, with status migrainosus: Secondary | ICD-10-CM | POA: Insufficient documentation

## 2016-08-25 ENCOUNTER — Encounter: Payer: Self-pay | Admitting: Vascular Surgery

## 2016-09-03 ENCOUNTER — Encounter: Payer: Self-pay | Admitting: Vascular Surgery

## 2016-09-03 ENCOUNTER — Ambulatory Visit (INDEPENDENT_AMBULATORY_CARE_PROVIDER_SITE_OTHER): Payer: BLUE CROSS/BLUE SHIELD | Admitting: Vascular Surgery

## 2016-09-03 ENCOUNTER — Ambulatory Visit (INDEPENDENT_AMBULATORY_CARE_PROVIDER_SITE_OTHER)
Admission: RE | Admit: 2016-09-03 | Discharge: 2016-09-03 | Disposition: A | Discharge status: Home / Self Care | Payer: BLUE CROSS/BLUE SHIELD | Source: Ambulatory Visit | Attending: Vascular Surgery | Admitting: Vascular Surgery

## 2016-09-03 ENCOUNTER — Ambulatory Visit (HOSPITAL_COMMUNITY)
Admission: RE | Admit: 2016-09-03 | Discharge: 2016-09-03 | Disposition: A | Discharge status: Home / Self Care | Payer: BLUE CROSS/BLUE SHIELD | Source: Ambulatory Visit | Attending: Vascular Surgery | Admitting: Vascular Surgery

## 2016-09-03 VITALS — BP 111/72 | HR 59 | Temp 97.5°F | Resp 16 | Ht 61.0 in | Wt 163.5 lb

## 2016-09-03 DIAGNOSIS — I70209 Unspecified atherosclerosis of native arteries of extremities, unspecified extremity: Secondary | ICD-10-CM | POA: Diagnosis not present

## 2016-09-03 DIAGNOSIS — M79604 Pain in right leg: Secondary | ICD-10-CM | POA: Insufficient documentation

## 2016-09-03 DIAGNOSIS — Z95828 Presence of other vascular implants and grafts: Secondary | ICD-10-CM

## 2016-09-03 NOTE — Progress Notes (Signed)
Patient name: Hannah Mcdaniel MRN: [id number] DOB: 1965-08-04 Sex: female  REASON FOR VISIT:    Follow up of aortoiliac occlusive disease.  HPI:   Hannah Mcdaniel is a pleasant 51 y.o. female who underwent angioplasty of a recurrent right common iliac artery stenosis on 11/26/2015. This was for a 70% stenosis with a 20 mmHg pressure gradient at rest. There was an excellent result with no residual stenosis. I last saw the patient in follow up on 03/05/2016. At that time ABI was 100% with biphasic Doppler signals in the right foot. There was triphasic flow throughout the stent. The patient comes in for routine follow up visit. Of note, the original iliac stent was placed in December 2014.  Since I saw her last, she denies any claudication, rest pain, or nonhealing ulcers. Her only complaint is right hip pain which occurs at night when she is in bed and also with sitting and standing. I suspect that this is related to arthritis.  She is on aspirin, Plavix, and a statin.  She is cut down to 1 cigarette per day.  Past Medical History:  Diagnosis Date  . Anxiety   . Arterial occlusive disease Nov. 2014  . Arthritis   . Claudication of lower extremity [facility]) Nov. 2014   Right Lower Extremity rest pain  . Depression   . Diabetes mellitus without complication ([place])   . Hypertension   . Migraines     Family History  Problem Relation Age of Onset  . Diabetes Mother   . Hyperlipidemia Mother   . Hypertension Mother   . Varicose Veins Mother   . Cancer Father   . Diabetes Brother   . Hypertension Brother   . Hyperlipidemia Brother     SOCIAL HISTORY: Social History  Substance Use Topics  . Smoking status: Current Some Day Smoker    Types: Cigarettes    Last attempt to quit: 01/25/2016  . Smokeless tobacco: Never Used     Comment: 1 cigarette per day  . Alcohol use No    Allergies  Allergen Reactions  . Influenza Vaccines Other (See Comments)    Sensitive to vaccine per  pt.  Diona Fanti [Aspirin] Rash    Can take low-dose aspirin.  . Codeine Rash  . Penicillins Rash    Has patient had a PCN reaction causing immediate rash, facial/tongue/throat swelling, SOB or lightheadedness with hypotension:Yes Has patient had a PCN reaction causing severe rash involving mucus membranes or skin necrosis:Yes Has patient had a PCN reaction that required hospitalization:No Has patient had a PCN reaction occurring within the last 10 years:No If all of the above answers are "NO", then may proceed with Cephalosporin use.     Current Outpatient Prescriptions  Medication Sig Dispense Refill  . aspirin 81 MG chewable tablet Chew 81 mg by mouth every evening.    . Choline Fenofibrate (FENOFIBRIC ACID) 135 MG CPDR TAKE ONE CAPSULE (135 MG TOTAL) BY MOUTH DAILY.    . citalopram (CELEXA) 40 MG tablet Take 40 mg by mouth at bedtime.  1  . clopidogrel (PLAVIX) 75 MG tablet TAKE 1 TABLET BY MOUTH EVERY DAY 30 tablet 11  . gabapentin (NEURONTIN) 100 MG capsule Take 300 mg by mouth at bedtime.  2  . LORazepam (ATIVAN) 0.5 MG tablet Take 1 mg by mouth 2 (two) times daily.     . meclizine (ANTIVERT) 25 MG tablet Take 25 mg by mouth 3 (three) times daily as needed for dizziness.     Marland Kitchen  rosuvastatin (CRESTOR) 40 MG tablet Take 40 mg by mouth at bedtime.  5  . metFORMIN (GLUCOPHAGE) 500 MG tablet Take 500 mg by mouth daily.      No current facility-administered medications for this visit.     REVIEW OF SYSTEMS:  [X]  denotes positive finding, [ ]  denotes negative finding Cardiac  Comments:  Chest pain or chest pressure:    Shortness of breath upon exertion:    Short of breath when lying flat:    Irregular heart rhythm:        Vascular    Pain in calf, thigh, or hip brought on by ambulation:    Pain in feet at night that wakes you up from your sleep:     Blood clot in your veins:    Leg swelling:         Pulmonary    Oxygen at home:    Productive cough:     Wheezing:           Neurologic    Sudden weakness in arms or legs:     Sudden numbness in arms or legs:     Sudden onset of difficulty speaking or slurred speech:    Temporary loss of vision in one eye:     Problems with dizziness:  X       Gastrointestinal    Blood in stool:     Vomited blood:         Genitourinary    Burning when urinating:     Blood in urine:        Psychiatric    Major depression:         Hematologic    Bleeding problems:    Problems with blood clotting too easily:        Skin    Rashes or ulcers:        Constitutional    Fever or chills:     PHYSICAL EXAM:   Vitals:   09/03/16 0951  BP: 111/72  Pulse: (!) 59  Resp: 16  Temp: (!) 97.5 F (36.4 C)  TempSrc: Oral  SpO2: 100%  Weight: 163 lb 8 oz (74.2 kg)  Height: 5\' 1"  (1.549 m)    GENERAL: The patient is a well-nourished female, in no acute distress. The vital signs are documented above. CARDIAC: There is a regular rate and rhythm.  VASCULAR: I do not detect carotid bruits. On the right side she has a palpable femoral, popliteal, dorsalis pedis, and posterior tibial pulse. On the left side, she has a palpable femoral, popliteal, and dorsalis pedis pulse. She has no significant lower extremity swelling. PULMONARY: There is good air exchange bilaterally without wheezing or rales. ABDOMEN: Soft and non-tender with normal pitched bowel sounds.  MUSCULOSKELETAL: There are no major deformities or cyanosis. NEUROLOGIC: No focal weakness or paresthesias are detected. SKIN: There are no ulcers or rashes noted. PSYCHIATRIC: The patient has a normal affect.  DATA:    ILIAC ARTERY STENT DUPLEX: I have independently interpreted the duplex of the stent. There is triphasic Doppler signals throughout the distal aorta and through the common iliac artery. There are no areas of stenosis noted within the stent.  BILATERAL ARTERIAL DOPPLER STUDY: I have independently interpreted her bilateral lower extremity arterial Doppler  study.  On the right side there is a triphasic dorsalis pedis and posterior tibial signal. ABI is 100%. Toe pressure on the right is 102 mmHg.  On the left side there is a triphasic dorsalis  pedis and posterior tibial signal. ABI is 100%. Toe pressure on the left is 98 mm [place].  MEDICAL ISSUES:   STATUS POST ANGIOPLASTY AND OF THE RIGHT COMMON ILIAC ARTERY: Her stent is widely patent. She has normal ABIs bilaterally. I have encouraged her to try to get off the cigarettes completely. She had quit temporarily on 01/25/2016 and is now trying to quit again. We have discussed the importance of this. I've encouraged her also to stay as active as possible. She will main on aspirin, Plavix, and a statin. I ordered a follow up duplex and ABIs in 1 year and I will see her back at that time. I think her right hip pain is related to osteoarthritis.  [name] [facility] [phone]

## 2016-09-24 NOTE — Addendum Note (Signed)
Addended by: Lianne Cure A on: 09/24/2016 12:18 PM   Modules accepted: Orders

## 2017-03-21 ENCOUNTER — Other Ambulatory Visit: Payer: Self-pay | Admitting: Vascular Surgery

## 2017-03-21 DIAGNOSIS — I739 Peripheral vascular disease, unspecified: Secondary | ICD-10-CM

## 2017-10-21 ENCOUNTER — Ambulatory Visit (INDEPENDENT_AMBULATORY_CARE_PROVIDER_SITE_OTHER)
Admission: RE | Admit: 2017-10-21 | Discharge: 2017-10-21 | Disposition: A | Discharge status: Home / Self Care | Payer: BLUE CROSS/BLUE SHIELD | Source: Ambulatory Visit | Attending: Vascular Surgery | Admitting: Vascular Surgery

## 2017-10-21 ENCOUNTER — Ambulatory Visit (INDEPENDENT_AMBULATORY_CARE_PROVIDER_SITE_OTHER): Payer: BLUE CROSS/BLUE SHIELD | Admitting: Vascular Surgery

## 2017-10-21 ENCOUNTER — Ambulatory Visit (HOSPITAL_COMMUNITY)
Admission: RE | Admit: 2017-10-21 | Discharge: 2017-10-21 | Disposition: A | Discharge status: Home / Self Care | Payer: BLUE CROSS/BLUE SHIELD | Source: Ambulatory Visit | Attending: Vascular Surgery | Admitting: Vascular Surgery

## 2017-10-21 ENCOUNTER — Other Ambulatory Visit: Payer: Self-pay

## 2017-10-21 ENCOUNTER — Encounter: Payer: Self-pay | Admitting: Vascular Surgery

## 2017-10-21 VITALS — BP 158/89 | HR 68 | Temp 97.6°F | Resp 16 | Ht 61.0 in | Wt 160.0 lb

## 2017-10-21 DIAGNOSIS — I70209 Unspecified atherosclerosis of native arteries of extremities, unspecified extremity: Secondary | ICD-10-CM | POA: Diagnosis present

## 2017-10-21 DIAGNOSIS — Z95828 Presence of other vascular implants and grafts: Secondary | ICD-10-CM

## 2017-10-21 NOTE — Progress Notes (Signed)
Patient name: Hannah Mcdaniel MRN: [id number] DOB: October 23, 1965 Sex: female  REASON FOR VISIT:   Follow-up of aortoiliac occlusive disease.  HPI:   Hannah Mcdaniel is a pleasant 52 y.o. female who underwent angioplasty of a recurrent right common iliac artery stenosis in October 2017.  I last saw the patient on 09/03/2016.  At that time the right common iliac artery stent was widely patent.  ABIs were 100%.  She comes in for a one-year follow-up visit.  Since I saw her last, she denies any claudication, rest pain, or nonhealing ulcers.  She does occasionally get some pain in her right hip with ambulation however she has a history of bursitis and has recently had an injection.  She has made some lifestyle changes and is walking quite a bit.  She gets 10,000 steps a day.  She is almost completely quit smoking and is only smoking a half a cigarette a day.  She is on her aspirin, Plavix, and a statin.  Past Medical History:  Diagnosis Date  . Anxiety   . Arterial occlusive disease Nov. 2014  . Arthritis   . Claudication of lower extremity [facility]) Nov. 2014   Right Lower Extremity rest pain  . Depression   . Diabetes mellitus without complication ([place])   . Hypertension   . Migraines     Family History  Problem Relation Age of Onset  . Diabetes Mother   . Hyperlipidemia Mother   . Hypertension Mother   . Varicose Veins Mother   . Cancer Father   . Diabetes Brother   . Hypertension Brother   . Hyperlipidemia Brother     SOCIAL HISTORY: Social History   Tobacco Use  . Smoking status: Current Every Day Smoker    Types: Cigarettes  . Smokeless tobacco: Never Used  . Tobacco comment: 1/2 cigarette per day  Substance Use Topics  . Alcohol use: No    Allergies  Allergen Reactions  . Influenza Vaccines Other (See Comments)    Sensitive to vaccine per pt.  Diona Fanti [Aspirin] Rash    Can take low-dose aspirin.  . Codeine Rash  . Penicillins Rash    Has patient had a PCN  reaction causing immediate rash, facial/tongue/throat swelling, SOB or lightheadedness with hypotension:Yes Has patient had a PCN reaction causing severe rash involving mucus membranes or skin necrosis:Yes Has patient had a PCN reaction that required hospitalization:No Has patient had a PCN reaction occurring within the last 10 years:No If all of the above answers are "NO", then may proceed with Cephalosporin use.     Current Outpatient Medications  Medication Sig Dispense Refill  . aspirin 81 MG chewable tablet Chew 81 mg by mouth every evening.    . Choline Fenofibrate (FENOFIBRIC ACID) 135 MG CPDR TAKE ONE CAPSULE (135 MG TOTAL) BY MOUTH DAILY.    . citalopram (CELEXA) 40 MG tablet Take 40 mg by mouth at bedtime.  1  . clopidogrel (PLAVIX) 75 MG tablet TAKE 1 TABLET BY MOUTH EVERY DAY 30 tablet 7  . gabapentin (NEURONTIN) 100 MG capsule Take 300 mg by mouth at bedtime.  2  . LORazepam (ATIVAN) 0.5 MG tablet Take 1 mg by mouth 2 (two) times daily.     . meclizine (ANTIVERT) 25 MG tablet Take 25 mg by mouth 3 (three) times daily as needed for dizziness.     . rosuvastatin (CRESTOR) 40 MG tablet Take 40 mg by mouth at bedtime.  5  . metFORMIN (GLUCOPHAGE)  500 MG tablet Take 500 mg by mouth daily.      No current facility-administered medications for this visit.     REVIEW OF SYSTEMS:  [X]  denotes positive finding, [ ]  denotes negative finding Cardiac  Comments:  Chest pain or chest pressure:    Shortness of breath upon exertion:    Short of breath when lying flat:    Irregular heart rhythm:        Vascular    Pain in calf, thigh, or hip brought on by ambulation:    Pain in feet at night that wakes you up from your sleep:     Blood clot in your veins:    Leg swelling:         Pulmonary    Oxygen at home:    Productive cough:     Wheezing:         Neurologic    Sudden weakness in arms or legs:     Sudden numbness in arms or legs:     Sudden onset of difficulty speaking or  slurred speech:    Temporary loss of vision in one eye:     Problems with dizziness:         Gastrointestinal    Blood in stool:     Vomited blood:         Genitourinary    Burning when urinating:     Blood in urine:        Psychiatric    Major depression:         Hematologic    Bleeding problems:    Problems with blood clotting too easily:        Skin    Rashes or ulcers:        Constitutional    Fever or chills:     PHYSICAL EXAM:   Vitals:   10/21/17 0906  BP: (!) 158/89  Pulse: 68  Resp: 16  Temp: 97.6 F (36.4 C)  TempSrc: Oral  SpO2: 96%  Weight: 160 lb (72.6 kg)  Height: 5\' 1"  (1.549 m)    GENERAL: The patient is a well-nourished female, in no acute distress. The vital signs are documented above. CARDIAC: There is a regular rate and rhythm.  VASCULAR: I do not detect carotid bruits. It is difficult to palpate her right femoral pulse.  I cannot palpate pedal pulses on the right. On the left side she has a diminished but palpable femoral pulse and also a dorsalis pedis pulse. Both feet are warm and well-perfused. She has no significant lower extremity swelling. PULMONARY: There is good air exchange bilaterally without wheezing or rales. ABDOMEN: Soft and non-tender with normal pitched bowel sounds.  MUSCULOSKELETAL: There are no major deformities or cyanosis. NEUROLOGIC: No focal weakness or paresthesias are detected. SKIN: There are no ulcers or rashes noted. PSYCHIATRIC: The patient has a normal affect.  DATA:    DUPLEX ABDOMINAL AORTA: I have independently interpreted the duplex of the abdominal aorta.  There are elevated velocities in the area of the stent with a 50 to 99% stenosis.  ARTERIAL DOPPLER STUDY: I did apparently interpreted her arterial Doppler study.  On the right side there is a biphasic dorsalis pedis and posterior tibial signal.  ABI is 100%.  Toe pressure is 94 mmHg.  On the left side there is a triphasic dorsalis pedis and  posterior tibial signal.  ABI is 100%.  Toe pressure is 98 mmHg.  MEDICAL ISSUES:   RECURRENT RIGHT COMMON  ILIAC ARTERY STENOSIS: The patient does have some recurrent stenosis in her right common iliac artery stent.  This is happened before.  I reviewed the images from the duplex and the narrowing does not appear especially tight.  She has maintained normal ABIs and is asymptomatic.  For this reason I am reluctant to chase this.  She is on her aspirin, Plavix and a statin.  I have recommended a follow-up duplex and ABIs in 6 months and I will see her at that time.  I have encouraged her to continue with her's walking program and she is also trying to get off the cigarettes completely.  She will call sooner if she has problems.  [name] [facility] [phone]

## 2018-04-03 ENCOUNTER — Other Ambulatory Visit: Payer: Self-pay | Admitting: Vascular Surgery

## 2018-04-03 DIAGNOSIS — I739 Peripheral vascular disease, unspecified: Secondary | ICD-10-CM

## 2018-04-12 ENCOUNTER — Other Ambulatory Visit: Payer: Self-pay

## 2018-04-12 DIAGNOSIS — I70209 Unspecified atherosclerosis of native arteries of extremities, unspecified extremity: Secondary | ICD-10-CM

## 2018-04-12 DIAGNOSIS — Z95828 Presence of other vascular implants and grafts: Secondary | ICD-10-CM

## 2018-04-14 ENCOUNTER — Ambulatory Visit: Payer: BLUE CROSS/BLUE SHIELD | Admitting: Vascular Surgery

## 2018-04-14 ENCOUNTER — Ambulatory Visit (HOSPITAL_COMMUNITY): Admission: RE | Admit: 2018-04-14 | Payer: BLUE CROSS/BLUE SHIELD | Source: Ambulatory Visit

## 2018-04-28 ENCOUNTER — Ambulatory Visit: Payer: BLUE CROSS/BLUE SHIELD | Admitting: Vascular Surgery

## 2018-05-05 ENCOUNTER — Ambulatory Visit: Payer: BLUE CROSS/BLUE SHIELD | Admitting: Vascular Surgery

## 2018-05-05 ENCOUNTER — Encounter (HOSPITAL_COMMUNITY): Payer: BLUE CROSS/BLUE SHIELD

## 2018-07-08 ENCOUNTER — Telehealth (HOSPITAL_COMMUNITY): Payer: Self-pay | Admitting: Rehabilitation

## 2018-07-08 NOTE — Telephone Encounter (Signed)

## 2018-07-09 ENCOUNTER — Other Ambulatory Visit: Payer: Self-pay

## 2018-07-09 ENCOUNTER — Ambulatory Visit (HOSPITAL_COMMUNITY)
Admission: RE | Admit: 2018-07-09 | Discharge: 2018-07-09 | Disposition: A | Discharge status: Home / Self Care | Payer: BLUE CROSS/BLUE SHIELD | Source: Ambulatory Visit | Attending: Vascular Surgery | Admitting: Vascular Surgery

## 2018-07-09 DIAGNOSIS — I70209 Unspecified atherosclerosis of native arteries of extremities, unspecified extremity: Secondary | ICD-10-CM | POA: Diagnosis present

## 2018-07-09 DIAGNOSIS — Z95828 Presence of other vascular implants and grafts: Secondary | ICD-10-CM | POA: Diagnosis present

## 2018-07-12 ENCOUNTER — Telehealth: Payer: Self-pay | Admitting: *Deleted

## 2018-07-12 NOTE — Telephone Encounter (Signed)
Virtual Visit Pre-Appointment Phone Call  Today, I spoke with Hannah Mcdaniel and performed the following actions:  1. I explained that we are currently trying to limit exposure to the COVID-19 virus by seeing patients at home rather than in the office.  I explained that the visits are best done by video, but can be done by telephone.  I asked the patient if a virtual visit that the patient would like to try instead of coming into the office. Hannah Mcdaniel agreed to proceed with the virtual visit scheduled with Dr. Scot Dock on 07/14/2018.          If the patient said yes, I continued with 2 (below).  If the patient said no, I discussed other options and documented those and did not continue to 2.  2. I confirmed the [facility] phone number to call the day of the visit and- I included this in appointment notes.         If the patient said yes, I documented "VIDEO" in the appointment notes.  If the patient said no, I documented "PHONE" in the appointment notes.  3. I confirmed consent by  a. sending through [place] or by email the [place] as written at the end of this message or  b. verbally as listed below. i. This visit is being performed in the setting of COVID-19. ii. All virtual visits are billed to your insurance company just like a normal visit would be.   iii. We'd like you to understand that the technology does not allow for your provider to perform an examination, and thus may limit your provider's ability to fully assess your condition.  iv. If your provider identifies any concerns that need to be evaluated in person, we will make arrangements to do so.   v. Finally, though the technology is pretty good, we cannot assure that it will always work on either your or our end, and in the setting of a video visit, we may have to convert it to a phone-only visit.  In either situation, we cannot ensure that we have a secure connection.   vi. Are you  willing to proceed?"  STAFF: Did the patient verbally acknowledge consent to telehealth visit? Document YES/NO here: YES  2. I advised the patient to be prepared - I asked that the patient, on the day of her visit, record any information possible with the equipment at her home, such as blood pressure, pulse, oxygen saturation, and your weight and write them all down. I asked the patient to have a pen and paper handy nearby the day of the visit as well.  3. If the patient was scheduled for a video visit, I informed the patient that the visit with the doctor would start with a text to the smartphone # given to [place] by the patient.         If the patient was scheduled for a telephone call, I informed the patient that the visit with the doctor would start with a call to the telephone # given to [place] by the patient.  4. I Informed patient they will receive a phone call 15 minutes prior to their appointment time from a [place] or nurse to review medications, allergies, etc. to prepare for the visit.    TELEPHONE CALL NOTE  Hannah Mcdaniel has been deemed a candidate for a follow-up tele-health visit to limit community exposure during the Covid-19 pandemic. I spoke with  the patient via phone to ensure availability of phone/video source, confirm preferred email & phone number, and discuss instructions and expectations.  I reminded Hannah Mcdaniel to be prepared with any vital sign and/or heart rhythm information that could potentially be obtained via home monitoring, at the time of her visit. I reminded Hannah Mcdaniel to expect a phone call prior to her visit.  Cleaster Corin, NT 07/12/2018 12:54 PM     FULL LENGTH CONSENT FOR TELE-HEALTH VISIT   I hereby voluntarily request, consent and authorize [facility] and its employed or contracted physicians, physician assistants, nurse practitioners or other licensed health care professionals (the Practitioner), to provide me with telemedicine health  care services (the "Services") as deemed necessary by the treating Practitioner. I acknowledge and consent to receive the Services by the Practitioner via telemedicine. I understand that the telemedicine visit will involve communicating with the Practitioner through live audiovisual communication technology and the disclosure of certain medical information by electronic transmission. I acknowledge that I have been given the opportunity to request an in-person assessment or other available alternative prior to the telemedicine visit and am voluntarily participating in the telemedicine visit.  I understand that I have the right to withhold or withdraw my consent to the use of telemedicine in the course of my care at any time, without affecting my right to future care or treatment, and that the Practitioner or I may terminate the telemedicine visit at any time. I understand that I have the right to inspect all information obtained and/or recorded in the course of the telemedicine visit and may receive copies of available information for a reasonable fee.  I understand that some of the potential risks of receiving the Services via telemedicine include:  Marland Kitchen Delay or interruption in medical evaluation due to technological equipment failure or disruption; . Information transmitted may not be sufficient (e.g. poor resolution of images) to allow for appropriate medical decision making by the Practitioner; and/or  . In rare instances, security protocols could fail, causing a breach of personal health information.  Furthermore, I acknowledge that it is my responsibility to provide information about my medical history, conditions and care that is complete and accurate to the best of my ability. I acknowledge that Practitioner's advice, recommendations, and/or decision may be based on factors not within their control, such as incomplete or inaccurate data provided by me or distortions of diagnostic images or specimens that  may result from electronic transmissions. I understand that the practice of medicine is not an exact science and that Practitioner makes no warranties or guarantees regarding treatment outcomes. I acknowledge that I will receive a copy of this consent concurrently upon execution via email to the email address I last provided but may also request a printed copy by calling the office of [facility].    I understand that my insurance will be billed for this visit.   I have read or had this consent read to me. . I understand the contents of this consent, which adequately explains the benefits and risks of the Services being provided via telemedicine.  . I have been provided ample opportunity to ask questions regarding this consent and the Services and have had my questions answered to my satisfaction. . I give my informed consent for the services to be provided through the use of telemedicine in my medical care  By participating in this telemedicine visit I agree to the above.

## 2018-07-14 ENCOUNTER — Ambulatory Visit (INDEPENDENT_AMBULATORY_CARE_PROVIDER_SITE_OTHER): Payer: BLUE CROSS/BLUE SHIELD | Admitting: Vascular Surgery

## 2018-07-14 ENCOUNTER — Encounter: Payer: Self-pay | Admitting: Vascular Surgery

## 2018-07-14 ENCOUNTER — Other Ambulatory Visit: Payer: Self-pay | Admitting: *Deleted

## 2018-07-14 VITALS — BP 118/64 | Temp 98.7°F

## 2018-07-14 DIAGNOSIS — I739 Peripheral vascular disease, unspecified: Secondary | ICD-10-CM

## 2018-07-14 NOTE — Progress Notes (Signed)
Patient name: Hannah Mcdaniel DOB: September 11, 1965 Sex: female    Referring Provider is Dione Housekeeper, MD  PCP is Dione Housekeeper, MD  REASON FOR VIRTUAL VISIT: Follow-up of aortoiliac occlusive disease.  I connected with Hannah Mcdaniel on 07/14/18 at  1:00 PM EDT by a video enabled telemedicine application and verified that I am speaking with the correct person using two identifiers. I discussed the limitations of evaluation and management by telemedicine and the availability of in person appointments. The patient expressed understanding and agreed to proceed.  We initially connected via video however I was unable to hear her audio.  Given the failed video call I did proceed with a phone call.  Location: Patient: Home Provider: Office  HPI: Terryl Niziolek is a 53 y.o. female who I last saw on 10/21/2017.  This patient had an angioplasty of a recurrent right common iliac artery stenosis in October 2017.  At the time of her last visit there were some elevated velocities within the stent in the right common iliac artery.  ABI on the right however was 100%.  At that time the patient was on aspirin, Plavix, and a statin.  I recommended a follow-up duplex and ABIs in 6 months.  We also discussed the importance of walking and she was going to try to get off the cigarettes completely.  Since I saw her last she is gradually developed recurrent claudication symptoms in her right leg.  She experiences pain in the hip thigh and calf which is brought on by ambulation and relieved with rest.  Her symptoms are most pronounced in the hip.  The symptoms have been gradually progressive.  She continues to smoke.  She states that a carton (10 packs) last 3 weeks.  She is on aspirin, Plavix, and statin.  She denies any history of rest pain or nonhealing ulcers.  Current Outpatient Medications  Medication Sig Dispense Refill  . aspirin 81 MG chewable tablet Chew 81 mg by mouth every evening.    . Choline  Fenofibrate (FENOFIBRIC ACID) 135 MG CPDR TAKE ONE CAPSULE (135 MG TOTAL) BY MOUTH DAILY.    . citalopram (CELEXA) 40 MG tablet Take 40 mg by mouth at bedtime.  1  . clopidogrel (PLAVIX) 75 MG tablet TAKE 1 TABLET BY MOUTH EVERY DAY 90 tablet 2  . gabapentin (NEURONTIN) 100 MG capsule Take 300 mg by mouth at bedtime.  2  . LORazepam (ATIVAN) 0.5 MG tablet Take 1 mg by mouth 2 (two) times daily.     . meclizine (ANTIVERT) 25 MG tablet Take 25 mg by mouth 3 (three) times daily as needed for dizziness.     . rosuvastatin (CRESTOR) 40 MG tablet Take 40 mg by mouth at bedtime.  5  . metFORMIN (GLUCOPHAGE) 500 MG tablet Take 500 mg by mouth daily.      No current facility-administered medications for this visit.    REVIEW OF SYSTEMS: Valu.Nieves ] denotes positive finding; [  ] denotes negative finding  CARDIOVASCULAR:  [ ]  chest pain   [ ]  dyspnea on exertion  [ ]  leg swelling  CONSTITUTIONAL:  [ ]  fever   [ ]  chills   OBSERVATIONS/OBJECTIVE: Vitals:   07/14/18 1227  BP: 118/64  Temp: 98.7 F (37.1 C)   GENERAL: The patient is a well-nourished female, in no acute distress. The vital signs are documented above.  DATA:  DUPLEX RIGHT ILIAC STENT: I reviewed the duplex that was done on 07/09/2018.  There  was monophasic flow noted in the proximal and distal stent but no color flow was identified within the stent suggesting a near total occlusion.   MEDICAL ISSUES:   RECURRENT RIGHT COMMON ILIAC ARTERY STENOSIS: This patient had originally undergone right common iliac artery angioplasty and stenting in December 2014.  This was a 7 mm x 24 mm balloon expandable stent.  She developed a recurrent stenosis which was addressed with angioplasty of an in stent stenosis using a 7 mm balloon.  She is now developed a recurrent stenosis.  She does continue to smoke and we have discussed the importance of tobacco cessation.  She is on aspirin, Plavix, and a statin.  Given her recurrent symptoms I think it would be  worth restudying her and she could potentially have a covered stent placed which might give a better long-term result.  I have discussed the indications for the procedure the potential complications with the patient and she is agreeable to proceed.  Her procedure is scheduled for 07/16/2018.  FOLLOW UP INSTRUCTIONS:   I discussed the assessment and treatment plan with the patient. The patient was provided an opportunity to ask questions and all were answered. The patient agreed with the plan and demonstrated an understanding of the instructions. The patient was advised to call back or seek an in-person evaluation if the symptoms worsen or if the condition fails to improve as anticipated.  I provided 15 minutes of non-face-to-face time during this encounter.  Level 1  (99441)             5-10 minutes Level 2  (99442)            11-20 minutes Level 3  (99443)            21-30 minutes  [name] [facility] [place]

## 2018-07-14 NOTE — Progress Notes (Signed)
Instructed for Covid nasal swab at [facility] on 07/15/2018. Instructed to be at [facility] admitting at [place] on 07/16/2018 for procedure with Dr. Scot Dock.Hold pm dose Metformin on 07/15/2018.  NPO past MN night prior. Must have a driver and caregiver for discharge. Hold regular am medications until after this procedure. Verbalized understanding.

## 2018-07-14 NOTE — H&P (View-Only) (Signed)
Patient name: Hannah Mcdaniel DOB: [birth year] Sex: female    Referring Provider is Dione Housekeeper, MD  PCP is Dione Housekeeper, MD  REASON FOR VIRTUAL VISIT: Follow-up of aortoiliac occlusive disease.  I connected with Alex Gardener on 07/14/18 at  1:00 PM EDT by a video enabled telemedicine application and verified that I am speaking with the correct person using two identifiers. I discussed the limitations of evaluation and management by telemedicine and the availability of in person appointments. The patient expressed understanding and agreed to proceed.  We initially connected via video however I was unable to hear her audio.  Given the failed video call I did proceed with a phone call.  Location: Patient: Home Provider: Office  HPI: Hannah Mcdaniel is a 53 y.o. female who I last saw on 10/21/2017.  This patient had an angioplasty of a recurrent right common iliac artery stenosis in October 2017.  At the time of her last visit there were some elevated velocities within the stent in the right common iliac artery.  ABI on the right however was 100%.  At that time the patient was on aspirin, Plavix, and a statin.  I recommended a follow-up duplex and ABIs in 6 months.  We also discussed the importance of walking and she was going to try to get off the cigarettes completely.  Since I saw her last she is gradually developed recurrent claudication symptoms in her right leg.  She experiences pain in the hip thigh and calf which is brought on by ambulation and relieved with rest.  Her symptoms are most pronounced in the hip.  The symptoms have been gradually progressive.  She continues to smoke.  She states that a carton (10 packs) last 3 weeks.  She is on aspirin, Plavix, and statin.  She denies any history of rest pain or nonhealing ulcers.  Current Outpatient Medications  Medication Sig Dispense Refill  . aspirin 81 MG chewable tablet Chew 81 mg by mouth every evening.    . Choline  Fenofibrate (FENOFIBRIC ACID) 135 MG CPDR TAKE ONE CAPSULE (135 MG TOTAL) BY MOUTH DAILY.    . citalopram (CELEXA) 40 MG tablet Take 40 mg by mouth at bedtime.  1  . clopidogrel (PLAVIX) 75 MG tablet TAKE 1 TABLET BY MOUTH EVERY DAY 90 tablet 2  . gabapentin (NEURONTIN) 100 MG capsule Take 300 mg by mouth at bedtime.  2  . LORazepam (ATIVAN) 0.5 MG tablet Take 1 mg by mouth 2 (two) times daily.     . meclizine (ANTIVERT) 25 MG tablet Take 25 mg by mouth 3 (three) times daily as needed for dizziness.     . rosuvastatin (CRESTOR) 40 MG tablet Take 40 mg by mouth at bedtime.  5  . metFORMIN (GLUCOPHAGE) 500 MG tablet Take 500 mg by mouth daily.      No current facility-administered medications for this visit.    REVIEW OF SYSTEMS: Valu.Nieves ] denotes positive finding; [  ] denotes negative finding  CARDIOVASCULAR:  [ ]  chest pain   [ ]  dyspnea on exertion  [ ]  leg swelling  CONSTITUTIONAL:  [ ]  fever   [ ]  chills   OBSERVATIONS/OBJECTIVE: Vitals:   07/14/18 1227  BP: 118/64  Temp: 98.7 F (37.1 C)   GENERAL: The patient is a well-nourished female, in no acute distress. The vital signs are documented above.  DATA:  DUPLEX RIGHT ILIAC STENT: I reviewed the duplex that was done on 07/09/2018.  There  was monophasic flow noted in the proximal and distal stent but no color flow was identified within the stent suggesting a near total occlusion.   MEDICAL ISSUES:   RECURRENT RIGHT COMMON ILIAC ARTERY STENOSIS: This patient had originally undergone right common iliac artery angioplasty and stenting in December 2014.  This was a 7 mm x 24 mm balloon expandable stent.  She developed a recurrent stenosis which was addressed with angioplasty of an in stent stenosis using a 7 mm balloon.  She is now developed a recurrent stenosis.  She does continue to smoke and we have discussed the importance of tobacco cessation.  She is on aspirin, Plavix, and a statin.  Given her recurrent symptoms I think it would be  worth restudying her and she could potentially have a covered stent placed which might give a better long-term result.  I have discussed the indications for the procedure the potential complications with the patient and she is agreeable to proceed.  Her procedure is scheduled for 07/16/2018.  FOLLOW UP INSTRUCTIONS:   I discussed the assessment and treatment plan with the patient. The patient was provided an opportunity to ask questions and all were answered. The patient agreed with the plan and demonstrated an understanding of the instructions. The patient was advised to call back or seek an in-person evaluation if the symptoms worsen or if the condition fails to improve as anticipated.  I provided 15 minutes of non-face-to-face time during this encounter.  Level 1  (99441)             5-10 minutes Level 2  (99442)            11-20 minutes Level 3  (99443)            21-30 minutes  [name] [facility] [place]

## 2018-07-15 ENCOUNTER — Other Ambulatory Visit (HOSPITAL_COMMUNITY)
Admission: RE | Admit: 2018-07-15 | Discharge: 2018-07-15 | Disposition: A | Discharge status: Home / Self Care | Payer: BLUE CROSS/BLUE SHIELD | Source: Ambulatory Visit | Attending: Vascular Surgery | Admitting: Vascular Surgery

## 2018-07-15 ENCOUNTER — Other Ambulatory Visit: Payer: Self-pay

## 2018-07-15 DIAGNOSIS — Z1159 Encounter for screening for other viral diseases: Secondary | ICD-10-CM | POA: Diagnosis present

## 2018-07-15 LAB — SARS CORONAVIRUS 2 BY RT PCR (HOSPITAL ORDER, PERFORMED IN ~~LOC~~ HOSPITAL LAB): SARS Coronavirus 2: NEGATIVE

## 2018-07-16 ENCOUNTER — Ambulatory Visit (HOSPITAL_COMMUNITY)
Admission: RE | Admit: 2018-07-16 | Discharge: 2018-07-16 | Disposition: A | Discharge status: Home / Self Care | Payer: BLUE CROSS/BLUE SHIELD | Attending: Vascular Surgery | Admitting: Vascular Surgery

## 2018-07-16 ENCOUNTER — Encounter (HOSPITAL_COMMUNITY)
Admission: RE | Disposition: A | Discharge status: Home / Self Care | Payer: Self-pay | Source: Home / Self Care | Attending: Vascular Surgery

## 2018-07-16 ENCOUNTER — Other Ambulatory Visit: Payer: Self-pay

## 2018-07-16 DIAGNOSIS — Z7982 Long term (current) use of aspirin: Secondary | ICD-10-CM | POA: Insufficient documentation

## 2018-07-16 DIAGNOSIS — Z7902 Long term (current) use of antithrombotics/antiplatelets: Secondary | ICD-10-CM | POA: Insufficient documentation

## 2018-07-16 DIAGNOSIS — I739 Peripheral vascular disease, unspecified: Secondary | ICD-10-CM | POA: Diagnosis present

## 2018-07-16 DIAGNOSIS — I70211 Atherosclerosis of native arteries of extremities with intermittent claudication, right leg: Secondary | ICD-10-CM

## 2018-07-16 DIAGNOSIS — Z79899 Other long term (current) drug therapy: Secondary | ICD-10-CM | POA: Insufficient documentation

## 2018-07-16 DIAGNOSIS — F17210 Nicotine dependence, cigarettes, uncomplicated: Secondary | ICD-10-CM | POA: Insufficient documentation

## 2018-07-16 HISTORY — PX: ABDOMINAL AORTOGRAM W/LOWER EXTREMITY: CATH118223

## 2018-07-16 HISTORY — PX: PERIPHERAL VASCULAR INTERVENTION: CATH118257

## 2018-07-16 LAB — POCT I-STAT, CHEM 8
BUN: 16 mg/dL (ref 6–20)
Calcium, Ion: 1.21 mmol/L (ref 1.15–1.40)
Chloride: 105 mmol/L (ref 98–111)
Creatinine, Ser: 0.6 mg/dL (ref 0.44–1.00)
Glucose, Bld: 175 mg/dL — ABNORMAL HIGH (ref 70–99)
HCT: 42 % (ref 36.0–46.0)
Hemoglobin: 14.3 g/dL (ref 12.0–15.0)
Potassium: 4 mmol/L (ref 3.5–5.1)
Sodium: 138 mmol/L (ref 135–145)
TCO2: 24 mmol/L (ref 22–32)

## 2018-07-16 LAB — POCT ACTIVATED CLOTTING TIME
Activated Clotting Time: 252 seconds
Activated Clotting Time: 235 seconds
Activated Clotting Time: 191 seconds

## 2018-07-16 LAB — GLUCOSE, CAPILLARY
Glucose-Capillary: 163 mg/dL — ABNORMAL HIGH (ref 70–99)
Glucose-Capillary: 126 mg/dL — ABNORMAL HIGH (ref 70–99)

## 2018-07-16 SURGERY — ABDOMINAL AORTOGRAM W/LOWER EXTREMITY
Anesthesia: LOCAL | Laterality: Bilateral

## 2018-07-16 MED ORDER — IODIXANOL 320 MG/ML IV SOLN
INTRAVENOUS | Status: DC | PRN
  Administered 2018-07-16: 150 mL via INTRA_ARTERIAL

## 2018-07-16 MED ORDER — HYDRALAZINE HCL 20 MG/ML IJ SOLN: 5.0000 mg | INTRAMUSCULAR | Status: DC | PRN

## 2018-07-16 MED ORDER — FENTANYL CITRATE (PF) 100 MCG/2ML IJ SOLN
INTRAMUSCULAR | Status: DC | PRN
  Administered 2018-07-16 (×2): 50 ug via INTRAVENOUS

## 2018-07-16 MED ORDER — HEPARIN (PORCINE) IN NACL 1000-0.9 UT/500ML-% IV SOLN
INTRAVENOUS | Status: DC | PRN
  Administered 2018-07-16 (×2): 500 mL

## 2018-07-16 MED ORDER — MIDAZOLAM HCL 2 MG/2ML IJ SOLN
INTRAMUSCULAR | Status: AC
  Filled 2018-07-16: qty 2

## 2018-07-16 MED ORDER — SODIUM CHLORIDE 0.9 % WEIGHT BASED INFUSION: 1.0000 mL/kg/h | INTRAVENOUS | Status: DC

## 2018-07-16 MED ORDER — SODIUM CHLORIDE 0.9 % IV SOLN
INTRAVENOUS | Status: DC
  Administered 2018-07-16: 08:00:00 via INTRAVENOUS

## 2018-07-16 MED ORDER — HEPARIN (PORCINE) IN NACL 1000-0.9 UT/500ML-% IV SOLN
INTRAVENOUS | Status: AC
  Filled 2018-07-16: qty 1000

## 2018-07-16 MED ORDER — SODIUM CHLORIDE 0.9% FLUSH: 3.0000 mL | INTRAVENOUS | Status: DC | PRN

## 2018-07-16 MED ORDER — LABETALOL HCL 5 MG/ML IV SOLN: 10.0000 mg | INTRAVENOUS | Status: DC | PRN

## 2018-07-16 MED ORDER — LIDOCAINE HCL (PF) 1 % IJ SOLN
INTRAMUSCULAR | Status: DC | PRN
  Administered 2018-07-16: 30 mL

## 2018-07-16 MED ORDER — HEPARIN SODIUM (PORCINE) 1000 UNIT/ML IJ SOLN
INTRAMUSCULAR | Status: DC | PRN
  Administered 2018-07-16: 8000 [IU] via INTRAVENOUS

## 2018-07-16 MED ORDER — HEPARIN SODIUM (PORCINE) 1000 UNIT/ML IJ SOLN
INTRAMUSCULAR | Status: AC
  Filled 2018-07-16: qty 1

## 2018-07-16 MED ORDER — ACETAMINOPHEN 325 MG PO TABS: 650.0000 mg | ORAL_TABLET | ORAL | Status: DC | PRN

## 2018-07-16 MED ORDER — LIDOCAINE HCL (PF) 1 % IJ SOLN
INTRAMUSCULAR | Status: AC
  Filled 2018-07-16: qty 30

## 2018-07-16 MED ORDER — SODIUM CHLORIDE 0.9% FLUSH: 3.0000 mL | Freq: Two times a day (BID) | INTRAVENOUS | Status: DC

## 2018-07-16 MED ORDER — ONDANSETRON HCL 4 MG/2ML IJ SOLN: 4.0000 mg | Freq: Four times a day (QID) | INTRAMUSCULAR | Status: DC | PRN

## 2018-07-16 MED ORDER — MIDAZOLAM HCL 2 MG/2ML IJ SOLN
INTRAMUSCULAR | Status: DC | PRN
  Administered 2018-07-16: 1 mg via INTRAVENOUS

## 2018-07-16 MED ORDER — FENTANYL CITRATE (PF) 100 MCG/2ML IJ SOLN
INTRAMUSCULAR | Status: AC
  Filled 2018-07-16: qty 2

## 2018-07-16 MED ORDER — SODIUM CHLORIDE 0.9 % IV SOLN: 250.0000 mL | INTRAVENOUS | Status: DC | PRN

## 2018-07-16 SURGICAL SUPPLY — 18 items
BALLN MUSTANG 8.0X40 75 (BALLOONS) ×3
BALLOON MUSTANG 8.0X40 75 (BALLOONS) IMPLANT
CATH ANGIO 5F PIGTAIL 65CM (CATHETERS) ×1 IMPLANT
CATH BEACON 5 .035 65 KMP TIP (CATHETERS) ×1 IMPLANT
KIT ENCORE 26 ADVANTAGE (KITS) ×1 IMPLANT
KIT MICROPUNCTURE NIT STIFF (SHEATH) ×1 IMPLANT
KIT PV (KITS) ×3 IMPLANT
SHEATH BRITE TIP 7FR 35CM (SHEATH) ×1 IMPLANT
SHEATH PINNACLE 5F 10CM (SHEATH) ×1 IMPLANT
SHEATH PROBE COVER 6X72 (BAG) ×1 IMPLANT
STENT OMNILINK ELITE 7X19X80 (Permanent Stent) ×1 IMPLANT
STENT VIABAHN 7X39X80 VBX (Permanent Stent) ×1 IMPLANT
STOPCOCK MORSE 400PSI 3WAY (MISCELLANEOUS) ×1 IMPLANT
SYR MEDRAD MARK 7 150ML (SYRINGE) ×3 IMPLANT
TRANSDUCER W/STOPCOCK (MISCELLANEOUS) ×3 IMPLANT
TRAY PV CATH (CUSTOM PROCEDURE TRAY) ×3 IMPLANT
TUBING CIL FLEX 10 FLL-RA (TUBING) ×1 IMPLANT
WIRE HITORQ VERSACORE ST 145CM (WIRE) ×1 IMPLANT

## 2018-07-16 NOTE — Op Note (Signed)
PATIENT: Hannah Mcdaniel      MRN: [id number] DOB: 1965-11-05    DATE OF PROCEDURE: 07/16/2018  INDICATIONS:    Hannah Mcdaniel is a 53 y.o. female who presents with recurrent right lower extremity claudication.  She had a balloon expandable stent placed in her right common iliac artery in October 2014.  She developed a recurrent stenosis and underwent angioplasty of this in 2017.  On a follow-up duplex no flow was noted within the stent and on a telephone call she had developed recurrent symptoms in the right leg.  She presents for an arteriogram and possible intervention.  PROCEDURE:    1.  Conscious sedation 2.  Ultrasound-guided access to the right common femoral artery 3.  Aortogram with bilateral iliac arteriogram 4.  Angioplasty and stenting of the right common iliac artery (VBX 7 mm x 39 mm) 5.  Angioplasty and stenting of the right common and external iliac artery (7 mm x 29 mm balloon expandable stent) 6.  Postdilatation with an 8 mm x 40 mm balloon  SURGEON: Judeth Cornfield. Scot Dock, MD, FACS  ANESTHESIA: Local with sedation  EBL: Minimal  TECHNIQUE: The patient was brought to the peripheral vascular lab and was sedated.The period of conscious sedation was 85 minutes.  During that time period, I was present face-to-face 100% of the time.  The patient was administered 1 mg of Versed and 50 mcg of fentanyl. The patient's heart rate, blood pressure, and oxygen saturation were monitored by the nurse continuously during the procedure.  Both groins were prepped and draped in the usual sterile fashion.  Under ultrasound guidance, after the skin was anesthetized, I cannulated the right common femoral artery with a micropuncture needle and a micropuncture sheath was introduced over a wire.  This was exchanged for a 5 [place] sheath over a Bentson wire.  By ultrasound the femoral artery was patent. A real-time image was obtained and sent to the server.  The wire would not advance through  the occluded right common iliac artery.  I performed a retrograde right external iliac arteriogram to demonstrate the occlusion which was down to the bifurcation of the common iliac artery.  I used a KMP catheter and a versa core wire when it was able to get through the occlusion.  A pigtail catheter was positioned at the L1 vertebral body and flush aortogram obtained.  There was a short knob of the proximal right common iliac artery and I thought that I could address the occlusion with a covered stent.  I selected a 7 mm x 39 mm VBX stent which was positioned up to the aortic bifurcation down to just above the takeoff of the internal iliac artery.  This was deployed without difficulty to nominal pressure.  There was some slight irregularity at the distal stent and I went back with a 7 mm x 29 mm balloon expandable stent which was positioned overlapping the takeoff of the hypogastric artery.  This was inflated to 12 atm for 1 minute.  Completion film showed an excellent result in the common and external iliac arteries.  There was antegrade flow in the hypogastric artery with some irregularity in the proximal hypogastric artery.  In order to provide maximum overlap I went back with an 8 mm x 4 cm balloon which was positioned within the covered stent and inflated to 8 atm for 1 minute.  Completion film showed excellent result with no residual stenosis.    Next the pigtail catheter was removed  over a wire and then a retrograde right femoral shot obtained with right lower extremity runoff.  Patient was then transferred to the holding area for removal of the sheath.  No immediate complications were noted.  FINDINGS:   1.  Renal arteries are patent with no significant renal artery stenosis 10 5.  The infrarenal aorta is patent.  There is some slight ectasia of the distal infrarenal aorta.  The right common iliac artery is occluded with reconstitution via collaterals from the hypogastric artery of the external iliac  artery.  The left common external and internal iliac arteries are patent. 2.  The occluded right common iliac artery was addressed as described above with no residual stenosis. 3.  Right lower extremity runoff shows patent common femoral, deep femoral, superficial femoral, popliteal, peroneal, and proximal anterior tibial arteries.  The dominant runoff is the peroneal artery.  The distal anterior tibial artery is not visualized in the posterior tibial artery is occluded.  CLINICAL NOTE: The patient will remain on aspirin, Plavix, and a statin.  We have again had a long discussion about the importance of exercise, nutrition, and the need to try to get off nicotine completely.  [name], MD, FACS [facility]  DATE OF DICTATION:   07/16/2018

## 2018-07-16 NOTE — Progress Notes (Signed)
Up and walked and tolerated well; right groin stable, no bleeding or hematoma 

## 2018-07-16 NOTE — Progress Notes (Signed)
Site area: Right groin a 5 french long arterial sheath was removed  Site Prior to Removal:  Level 0  Pressure Applied For 20 MINUTES    Bedrest Beginning at 1230p  Manual:   Yes.    Patient Status During Pull:  stable  Post Pull Groin Site:  Level 0  Post Pull Instructions Given:  Yes.    Post Pull Pulses Present:  Yes.    Dressing Applied:  Yes.    Comments:

## 2018-07-16 NOTE — Interval H&P Note (Signed)
History and Physical Interval Note:  07/16/2018 8:58 AM  Hannah Mcdaniel  has presented today for surgery, with the diagnosis of PAD.  The various methods of treatment have been discussed with the patient and family. After consideration of risks, benefits and other options for treatment, the patient has consented to  Procedure(s): ABDOMINAL AORTOGRAM W/LOWER EXTREMITY (Bilateral) as a surgical intervention.  The patient's history has been reviewed, patient examined, no change in status, stable for surgery.  I have reviewed the patient's chart and labs.  Questions were answered to the patient's satisfaction.     [name]

## 2018-07-16 NOTE — Discharge Instructions (Signed)
NO METFORMIN FOR 2 DAYS  Femoral Site Care This sheet gives you information about how to care for yourself after your procedure. Your health care provider may also give you more specific instructions. If you have problems or questions, contact your health care provider. What can I expect after the procedure? After the procedure, it is common to have:  Bruising that usually fades within 1-2 weeks.  Tenderness at the site. Follow these instructions at home: Wound care  Follow instructions from your health care provider about how to take care of your insertion site. Make sure you: ? Wash your hands with soap and water before you change your bandage (dressing). If soap and water are not available, use hand sanitizer. ? Change your dressing as told by your health care provider. ? Leave stitches (sutures), skin glue, or adhesive strips in place. These skin closures may need to stay in place for 2 weeks or longer. If adhesive strip edges start to loosen and curl up, you may trim the loose edges. Do not remove adhesive strips completely unless your health care provider tells you to do that.  Do not take baths, swim, or use a hot tub until your health care provider approves.  You may shower 24-48 hours after the procedure or as told by your health care provider. ? Gently wash the site with plain soap and water. ? Pat the area dry with a clean towel. ? Do not rub the site. This may cause bleeding.  Do not apply powder or lotion to the site. Keep the site clean and dry.  Check your femoral site every day for signs of infection. Check for: ? Redness, swelling, or pain. ? Fluid or blood. ? Warmth. ? Pus or a bad smell. Activity  For the first 2-3 days after your procedure, or as long as directed: ? Avoid climbing stairs as much as possible. ? Do not squat.  Do not lift anything that is heavier than 10 lb (4.5 kg), or the limit that you are told, until your health care provider says that it  is safe.  Rest as directed. ? Avoid sitting for a long time without moving. Get up to take short walks every 1-2 hours.  Do not drive for 24 hours if you were given a medicine to help you relax (sedative). General instructions  Take over-the-counter and prescription medicines only as told by your health care provider.  Keep all follow-up visits as told by your health care provider. This is important. Contact a health care provider if you have:  A fever or chills.  You have redness, swelling, or pain around your insertion site. Get help right away if:  The catheter insertion area swells very fast.  You pass out.  You suddenly start to sweat or your skin gets clammy.  The catheter insertion area is bleeding, and the bleeding does not stop when you hold steady pressure on the area.  The area near or just beyond the catheter insertion site becomes pale, cool, tingly, or numb. These symptoms may represent a serious problem that is an emergency. Do not wait to see if the symptoms will go away. Get medical help right away. Call your local emergency services (911 in the [place] Do not drive yourself to the hospital. Summary  After the procedure, it is common to have bruising that usually fades within 1-2 weeks.  Check your femoral site every day for signs of infection.  Do not lift anything that is heavier than  10 lb (4.5 kg), or the limit that you are told, until your health care provider says that it is safe. This information is not intended to replace advice given to you by your health care provider. Make sure you discuss any questions you have with your health care provider. Document Released: 09/23/2013 Document Revised: 02/02/2017 Document Reviewed: 02/02/2017 Elsevier Interactive Patient Education  2019 [employer].   Moderate Conscious Sedation, Adult, Care After These instructions provide you with information about caring for yourself after your procedure. Your health care  provider may also give you more specific instructions. Your treatment has been planned according to current medical practices, but problems sometimes occur. Call your health care provider if you have any problems or questions after your procedure. What can I expect after the procedure? After your procedure, it is common:  To feel sleepy for several hours.  To feel clumsy and have poor balance for several hours.  To have poor judgment for several hours.  To vomit if you eat too soon. Follow these instructions at home: For at least 24 hours after the procedure:   Do not: ? Participate in activities where you could fall or become injured. ? Drive. ? Use heavy machinery. ? Drink alcohol. ? Take sleeping pills or medicines that cause drowsiness. ? Make important decisions or sign legal documents. ? Take care of children on your own.  Rest. Eating and drinking  Follow the diet recommended by your health care provider.  If you vomit: ? Drink water, juice, or soup when you can drink without vomiting. ? Make sure you have little or no nausea before eating solid foods. General instructions  Have a responsible adult stay with you until you are awake and alert.  Take over-the-counter and prescription medicines only as told by your health care provider.  If you smoke, do not smoke without supervision.  Keep all follow-up visits as told by your health care provider. This is important. Contact a health care provider if:  You keep feeling nauseous or you keep vomiting.  You feel light-headed.  You develop a rash.  You have a fever. Get help right away if:  You have trouble breathing. This information is not intended to replace advice given to you by your health care provider. Make sure you discuss any questions you have with your health care provider. Document Released: 11/10/2012 Document Revised: 06/25/2015 Document Reviewed: 05/12/2015 Elsevier Interactive Patient Education   2019 [employer].

## 2018-07-19 ENCOUNTER — Encounter (HOSPITAL_COMMUNITY): Payer: Self-pay | Admitting: Vascular Surgery

## 2018-08-02 ENCOUNTER — Other Ambulatory Visit: Payer: Self-pay

## 2018-08-02 DIAGNOSIS — I739 Peripheral vascular disease, unspecified: Secondary | ICD-10-CM

## 2018-08-04 ENCOUNTER — Ambulatory Visit (INDEPENDENT_AMBULATORY_CARE_PROVIDER_SITE_OTHER): Payer: BLUE CROSS/BLUE SHIELD | Admitting: Family

## 2018-08-04 ENCOUNTER — Other Ambulatory Visit: Payer: Self-pay

## 2018-08-04 ENCOUNTER — Ambulatory Visit (HOSPITAL_COMMUNITY)
Admission: RE | Admit: 2018-08-04 | Discharge: 2018-08-04 | Disposition: A | Discharge status: Home / Self Care | Payer: BLUE CROSS/BLUE SHIELD | Source: Ambulatory Visit | Attending: Family | Admitting: Family

## 2018-08-04 ENCOUNTER — Encounter: Payer: Self-pay | Admitting: Family

## 2018-08-04 VITALS — BP 118/72 | HR 88 | Temp 97.7°F | Resp 16 | Ht 61.0 in | Wt 161.0 lb

## 2018-08-04 DIAGNOSIS — I739 Peripheral vascular disease, unspecified: Secondary | ICD-10-CM

## 2018-08-04 DIAGNOSIS — F17200 Nicotine dependence, unspecified, uncomplicated: Secondary | ICD-10-CM | POA: Diagnosis not present

## 2018-08-04 DIAGNOSIS — I779 Disorder of arteries and arterioles, unspecified: Secondary | ICD-10-CM | POA: Diagnosis not present

## 2018-08-04 DIAGNOSIS — Z95828 Presence of other vascular implants and grafts: Secondary | ICD-10-CM | POA: Diagnosis not present

## 2018-08-04 MED ORDER — CLOPIDOGREL BISULFATE 75 MG PO TABS: 75.0000 mg | ORAL_TABLET | Freq: Every day | ORAL | 2 refills | Status: DC

## 2018-08-04 NOTE — Progress Notes (Signed)
[facility] [place]   CC: Follow up angioplasty and stenting of right CIA and right EIA, history of peripheral artery occlusive disease  History of Present Illness Hannah Mcdaniel is a 53 y.o. female who is s/p angioplasty and stenting of the right common iliac artery (VBX 7 mm x 39 mm), and angioplasty and stenting of the right common and external iliac artery (7 mm x 29 mm balloon expandable stent) on 07-16-18 by Dr. Scot Dock for occlusion of the stent and claudication.  Prior to the above she was s/p right common iliac artery angioplasty and stenting in December 2014.  This was a 7 mm x 24 mm balloon expandable stent.  She developed a recurrent stenosis which was addressed with angioplasty of an in stent stenosis using a 7 mm balloon in 2017.    She walks at least 7000 steps daily.  She denies any history of stroke or MI.   Diabetic: Yes, states her last A1C was 7.? Tobacco use: smoker  (less than 1/4 ppd , started at age 89 yrs)  Pt meds include: Statin :Yes Betablocker: No ASA: Yes, 81 mg Other anticoagulants/antiplatelets: Plavix  Past Medical History:  Diagnosis Date  . Anxiety   . Arterial occlusive disease Nov. 2014  . Arthritis   . Claudication of lower extremity [facility]) Nov. 2014   Right Lower Extremity rest pain  . Depression   . Diabetes mellitus without complication ([place])   . Hypertension   . Migraines     Social History Social History   Tobacco Use  . Smoking status: Current Every Day Smoker    Packs/day: 0.25    Types: Cigarettes  . Smokeless tobacco: Never Used  . Tobacco comment: 1/2 cigarette per day  Substance Use Topics  . Alcohol use: No  . Drug use: No    Family History Family History  Problem Relation Age of Onset  . Diabetes Mother   . Hyperlipidemia Mother   . Hypertension Mother   . Varicose Veins Mother   . Cancer Father   . Diabetes Brother   . Hypertension Brother   . Hyperlipidemia Brother     Past  Surgical History:  Procedure Laterality Date  . ABDOMINAL AORTAGRAM N/A 01/10/2013   Procedure: ABDOMINAL AORTAGRAM;  Surgeon: Rosetta Posner, MD;  Location: [facility] CATH LAB;  Service: Cardiovascular;  Laterality: N/A;  . ABDOMINAL AORTOGRAM W/LOWER EXTREMITY Bilateral 07/16/2018   Procedure: ABDOMINAL AORTOGRAM W/LOWER EXTREMITY;  Surgeon: Angelia Mould, MD;  Location: [place] CV LAB;  Service: Cardiovascular;  Laterality: Bilateral;  . ABDOMINAL HYSTERECTOMY    . CHOLECYSTECTOMY     Gall Bladder  . iliac artery angioplasty and stent placement  01/10/13  . LOWER EXTREMITY ANGIOGRAM Bilateral 01/10/2013   Procedure: LOWER EXTREMITY ANGIOGRAM;  Surgeon: Rosetta Posner, MD;  Location: [facility] CATH LAB;  Service: Cardiovascular;  Laterality: Bilateral;  . PERCUTANEOUS STENT INTERVENTION Right 01/10/2013   Procedure: PERCUTANEOUS STENT INTERVENTION;  Surgeon: Rosetta Posner, MD;  Location: [facility] CATH LAB;  Service: Cardiovascular;  Laterality: Right;  rt common iliac stent  . PERIPHERAL VASCULAR CATHETERIZATION N/A 11/26/2015   Procedure: Abdominal Aortogram w/Lower Extremity;  Surgeon: Angelia Mould, MD;  Location: [place] CV LAB;  Service: Cardiovascular;  Laterality: N/A;  . PERIPHERAL VASCULAR CATHETERIZATION Right 11/26/2015   Procedure: Peripheral Vascular Balloon Angioplasty;  Surgeon: Angelia Mould, MD;  Location: [place] CV LAB;  Service: Cardiovascular;  Laterality: Right;  rt common iliac  .  PERIPHERAL VASCULAR INTERVENTION  07/16/2018   Procedure: PERIPHERAL VASCULAR INTERVENTION;  Surgeon: Angelia Mould, MD;  Location: [place] CV LAB;  Service: Cardiovascular;;    Allergies  Allergen Reactions  . Asa [Aspirin] Rash    Can take low-dose aspirin.  . Codeine Rash  . Penicillins Rash    Has patient had a PCN reaction causing immediate rash, facial/tongue/throat swelling, SOB or lightheadedness with hypotension:Yes Has patient had a PCN reaction causing  severe rash involving mucus membranes or skin necrosis:Yes Has patient had a PCN reaction that required hospitalization:No Has patient had a PCN reaction occurring within the last 10 years:No If all of the above answers are "NO", then may proceed with Cephalosporin use.     Current Outpatient Medications  Medication Sig Dispense Refill  . aspirin 81 MG chewable tablet Chew 81 mg by mouth daily.     . Choline Fenofibrate (FENOFIBRIC ACID) 135 MG CPDR Take 135 mg by mouth at bedtime.     . citalopram (CELEXA) 40 MG tablet Take 40 mg by mouth at bedtime.  1  . clopidogrel (PLAVIX) 75 MG tablet TAKE 1 TABLET BY MOUTH EVERY DAY (Patient taking differently: Take 75 mg by mouth daily. ) 90 tablet 2  . gabapentin (NEURONTIN) 300 MG capsule Take 300 mg by mouth at bedtime.    Marland Kitchen LORazepam (ATIVAN) 0.5 MG tablet Take 1 mg by mouth 2 (two) times daily.     . meclizine (ANTIVERT) 25 MG tablet Take 25 mg by mouth 3 (three) times daily as needed for dizziness.     . metFORMIN (GLUCOPHAGE) 500 MG tablet Take 500 mg by mouth 2 (two) times a day.     . rosuvastatin (CRESTOR) 40 MG tablet Take 40 mg by mouth daily.   5   No current facility-administered medications for this visit.     ROS: See HPI for pertinent positives and negatives.   Physical Examination  Vitals:   08/04/18 1333  BP: 118/72  Pulse: 88  Resp: 16  Temp: 97.7 F (36.5 C)  TempSrc: Temporal  SpO2: 96%  Weight: 161 lb (73 kg)  Height: 5\' 1"  (1.549 m)   Body mass index is 30.42 kg/m.  General: A&O x 3, WDWN, obese female. Gait: normal HEENT: No gross abnormalities.  Pulmonary: Respirations are non labored, CTAB, fair air movement in all fields Cardiac: regular rhythm, no detected murmur.         Carotid Bruits Right Left   Negative Negative   Radial pulses are 2+ palpable bilaterally   Adominal aortic pulse is not palpable                         VASCULAR EXAM: Extremities without ischemic changes, without  Gangrene; without open wounds.                                                                                                          LE Pulses Right Left       FEMORAL  2+ palpable  2+ palpable        POPLITEAL  2+ palpable   1+ palpable       POSTERIOR TIBIAL  2+ palpable   not palpable        DORSALIS PEDIS      ANTERIOR TIBIAL not palpable  2+ palpable    Abdomen: soft, NT, no palpable masses. Skin: no rashes, no cellulitis, no ulcers noted. Musculoskeletal: no muscle wasting or atrophy.  Neurologic: A&O X 3; appropriate affect, Sensation is normal; MOTOR FUNCTION:  moving all extremities equally, motor strength 5/5 throughout. Speech is fluent/normal. CN 2-12 intact. Psychiatric: Thought content is normal, mood appropriate for clinical situation.     ASSESSMENT: SHAVONTA GOSSEN is a 53 y.o. female who is s/p angioplasty and stenting of the right common iliac artery (VBX 7 mm x 39 mm), and angioplasty and stenting of the right common and external iliac artery (7 mm x 29 mm balloon expandable stent) on 07-16-18 by Dr. Scot Dock for occlusion of the stent and claudication.  Prior to the above she was s/p right common iliac artery angioplasty and stenting in December 2014.  This was a 7 mm x 24 mm balloon expandable stent.  She developed a recurrent stenosis which was addressed with angioplasty of an in stent stenosis using a 7 mm balloon in 2017.    She walks a great deal daily for exercise and has no claudication symptoms. There are no signs of ischemia in her feet or legs. Her bilateral femoral and popliteal pulses are palpable. Right PT pulse and left DP pulses are 2+ palpable.  ABI's today are normal bilaterally with tri and monophasic  Waveforms on the right, triphasic waveforms in the left.     DATA  ABI (Date: 08/04/2018): Right    Rt Pressure (mmHg)IndexWaveform  Comment  +---------+------------------+-----+----------+--------+ Brachial 129                                        +---------+------------------+-----+----------+--------+ ATA      119               0.87 monophasic         +---------+------------------+-----+----------+--------+ PTA      133               0.97 triphasic          +---------+------------------+-----+----------+--------+ Great Toe83                0.61                    +---------+------------------+-----+----------+--------+  +---------+------------------+-----+---------+-------+ Left     Lt Pressure (mmHg)IndexWaveform Comment +---------+------------------+-----+---------+-------+ Brachial 137                                     +---------+------------------+-----+---------+-------+ ATA      129               0.94 triphasic        +---------+------------------+-----+---------+-------+ PTA      156               1.14 triphasic        +---------+------------------+-----+---------+-------+ Great Toe86                0.63                  +---------+------------------+-----+---------+-------+  +-------+-----------+-----------+------------+------------+  ABI/TBIToday's ABIToday's TBIPrevious ABIPrevious TBI +-------+-----------+-----------+------------+------------+ Right  0.97       0.61                                +-------+-----------+-----------+------------+------------+ Left   1.14       0.63                                +-------+-----------+-----------+------------+------------+  This is the first ABI since procedure on 09/15/18. Summary: Right: Resting right ankle-brachial index is within normal range. The right toe-brachial index is abnormal. RT great toe pressure = 83 mmHg. Left: Resting left ankle-brachial index is within normal range. The left toe-brachial index is abnormal. LT Great toe pressure = 86 mmHg.  Comparison study (10-21-17): normal bilateral ABI with biphasic waveforms on the right, triphasic in the  left.    PLAN:  Based on the patient's vascular studies and examination, pt will return to clinic in 3 months with ABI's. I advised her to notify us if she develops concern re the circulation in her feet or legs. Continue extensive walking.  Over 3 minutes was spent counseling patient re smoking cessation, and patient was given several free resources re smoking cessation.  I discussed in depth with the patient the nature of atherosclerosis, and emphasized the importance of maximal medical management including strict control of blood pressure, blood glucose, and lipid levels, obtaining regular exercise, and cessation of smoking.  The patient is aware that without maximal medical management the underlying atherosclerotic disease process will progress, limiting the benefit of any interventions.  The patient was given information about PAD including signs, symptoms, treatment, what symptoms should prompt the patient to seek immediate medical care, and risk reduction measures to take.  Clemon Chambers, RN, MSN, FNP-C [facility] Phone: [phone]  Clinic MD: Scot Dock  08/04/18 1:36 PM

## 2018-08-04 NOTE — Patient Instructions (Signed)

## 2018-10-06 ENCOUNTER — Ambulatory Visit: Payer: Self-pay | Admitting: Physician Assistant

## 2018-10-22 ENCOUNTER — Ambulatory Visit: Payer: Self-pay | Admitting: Physician Assistant

## 2018-11-15 ENCOUNTER — Encounter (HOSPITAL_COMMUNITY): Payer: BLUE CROSS/BLUE SHIELD

## 2018-11-15 ENCOUNTER — Ambulatory Visit: Payer: BLUE CROSS/BLUE SHIELD | Admitting: Family

## 2018-11-17 ENCOUNTER — Encounter (HOSPITAL_COMMUNITY): Payer: BLUE CROSS/BLUE SHIELD

## 2018-11-17 ENCOUNTER — Ambulatory Visit: Payer: BLUE CROSS/BLUE SHIELD | Admitting: Family

## 2018-11-22 ENCOUNTER — Other Ambulatory Visit: Payer: Self-pay

## 2018-11-22 DIAGNOSIS — I779 Disorder of arteries and arterioles, unspecified: Secondary | ICD-10-CM

## 2018-11-23 ENCOUNTER — Telehealth (HOSPITAL_COMMUNITY): Payer: Self-pay | Admitting: *Deleted

## 2018-11-23 NOTE — Telephone Encounter (Signed)

## 2018-11-24 ENCOUNTER — Encounter: Payer: Self-pay | Admitting: Family

## 2018-11-24 ENCOUNTER — Other Ambulatory Visit: Payer: Self-pay

## 2018-11-24 ENCOUNTER — Ambulatory Visit (HOSPITAL_COMMUNITY)
Admission: RE | Admit: 2018-11-24 | Discharge: 2018-11-24 | Disposition: A | Discharge status: Home / Self Care | Payer: PRIVATE HEALTH INSURANCE | Source: Ambulatory Visit | Attending: Family | Admitting: Family

## 2018-11-24 ENCOUNTER — Ambulatory Visit (INDEPENDENT_AMBULATORY_CARE_PROVIDER_SITE_OTHER): Payer: PRIVATE HEALTH INSURANCE | Admitting: Family

## 2018-11-24 VITALS — BP 120/69 | HR 91 | Temp 97.8°F | Resp 14 | Ht 61.0 in | Wt 152.0 lb

## 2018-11-24 DIAGNOSIS — I779 Disorder of arteries and arterioles, unspecified: Secondary | ICD-10-CM | POA: Diagnosis not present

## 2018-11-24 DIAGNOSIS — F17200 Nicotine dependence, unspecified, uncomplicated: Secondary | ICD-10-CM | POA: Diagnosis not present

## 2018-11-24 DIAGNOSIS — Z95828 Presence of other vascular implants and grafts: Secondary | ICD-10-CM

## 2018-11-24 NOTE — Patient Instructions (Signed)

## 2018-11-24 NOTE — Progress Notes (Signed)
[facility] [place]   CC: Follow up peripheral artery occlusive disease  History of Present Illness Hannah Mcdaniel is a 53 y.o. female who is s/p angioplasty and stenting of the right common iliac artery (VBX7 mm x 39 mm), and angioplasty and stenting of the right common and external iliac artery (7 mm x 29 mm balloon expandable stent) on 07-16-18 by Dr. Scot Dock for occlusion of the stent and claudication.  Prior to the above she was s/p right common iliac artery angioplasty and stenting in December 2014. This was a 7 mm x 24 mm balloon expandable stent. She developed a recurrent stenosis which was addressed with angioplasty of an in stent stenosis using a 7 mm balloon in 2017.   She walks at least 4-5 miles daily.  She denies any history of stroke or MI.   Diabetic: Yes, states her last A1C was 7.? Tobacco use: smoker  (1 cig qod , started at age 14 yrs)  Pt meds include: Statin :Yes Betablocker: No ASA: Yes, 81 mg Other anticoagulants/antiplatelets: Plavix   Past Medical History:  Diagnosis Date  . Anxiety   . Arterial occlusive disease Nov. 2014  . Arthritis   . Claudication of lower extremity [facility]) Nov. 2014   Right Lower Extremity rest pain  . Depression   . Diabetes mellitus without complication ([place])   . Hypertension   . Migraines     Social History Social History   Tobacco Use  . Smoking status: Current Every Day Smoker    Packs/day: 0.25    Types: Cigarettes  . Smokeless tobacco: Never Used  . Tobacco comment: 1/2 cigarette per day  Substance Use Topics  . Alcohol use: No  . Drug use: No    Family History Family History  Problem Relation Age of Onset  . Diabetes Mother   . Hyperlipidemia Mother   . Hypertension Mother   . Varicose Veins Mother   . Cancer Father   . Diabetes Brother   . Hypertension Brother   . Hyperlipidemia Brother     Past Surgical History:  Procedure Laterality Date  . ABDOMINAL AORTAGRAM N/A  01/10/2013   Procedure: ABDOMINAL AORTAGRAM;  Surgeon: Rosetta Posner, MD;  Location: [facility] CATH LAB;  Service: Cardiovascular;  Laterality: N/A;  . ABDOMINAL AORTOGRAM W/LOWER EXTREMITY Bilateral 07/16/2018   Procedure: ABDOMINAL AORTOGRAM W/LOWER EXTREMITY;  Surgeon: Angelia Mould, MD;  Location: [place] CV LAB;  Service: Cardiovascular;  Laterality: Bilateral;  . ABDOMINAL HYSTERECTOMY    . CHOLECYSTECTOMY     Gall Bladder  . iliac artery angioplasty and stent placement  01/10/13  . LOWER EXTREMITY ANGIOGRAM Bilateral 01/10/2013   Procedure: LOWER EXTREMITY ANGIOGRAM;  Surgeon: Rosetta Posner, MD;  Location: [facility] CATH LAB;  Service: Cardiovascular;  Laterality: Bilateral;  . PERCUTANEOUS STENT INTERVENTION Right 01/10/2013   Procedure: PERCUTANEOUS STENT INTERVENTION;  Surgeon: Rosetta Posner, MD;  Location: [facility] CATH LAB;  Service: Cardiovascular;  Laterality: Right;  rt common iliac stent  . PERIPHERAL VASCULAR CATHETERIZATION N/A 11/26/2015   Procedure: Abdominal Aortogram w/Lower Extremity;  Surgeon: Angelia Mould, MD;  Location: [place] CV LAB;  Service: Cardiovascular;  Laterality: N/A;  . PERIPHERAL VASCULAR CATHETERIZATION Right 11/26/2015   Procedure: Peripheral Vascular Balloon Angioplasty;  Surgeon: Angelia Mould, MD;  Location: [place] CV LAB;  Service: Cardiovascular;  Laterality: Right;  rt common iliac  . PERIPHERAL VASCULAR INTERVENTION  07/16/2018   Procedure: PERIPHERAL VASCULAR INTERVENTION;  Surgeon: Scot Dock,  Judeth Cornfield, MD;  Location: [place] CV LAB;  Service: Cardiovascular;;    Allergies  Allergen Reactions  . Asa [Aspirin] Rash    Can take low-dose aspirin.  . Codeine Rash  . Penicillins Rash    Has patient had a PCN reaction causing immediate rash, facial/tongue/throat swelling, SOB or lightheadedness with hypotension:Yes Has patient had a PCN reaction causing severe rash involving mucus membranes or skin necrosis:Yes Has patient had a  PCN reaction that required hospitalization:No Has patient had a PCN reaction occurring within the last 10 years:No If all of the above answers are "NO", then may proceed with Cephalosporin use.     Current Outpatient Medications  Medication Sig Dispense Refill  . aspirin 81 MG chewable tablet Chew 81 mg by mouth daily.     . Choline Fenofibrate (FENOFIBRIC ACID) 135 MG CPDR Take 135 mg by mouth at bedtime.     . citalopram (CELEXA) 40 MG tablet Take 40 mg by mouth at bedtime.  1  . clopidogrel (PLAVIX) 75 MG tablet Take 1 tablet (75 mg total) by mouth daily. 90 tablet 2  . gabapentin (NEURONTIN) 300 MG capsule Take 300 mg by mouth at bedtime.    Marland Kitchen LORazepam (ATIVAN) 0.5 MG tablet Take 1 mg by mouth 2 (two) times daily.     . meclizine (ANTIVERT) 25 MG tablet Take 25 mg by mouth 3 (three) times daily as needed for dizziness.     . rosuvastatin (CRESTOR) 40 MG tablet Take 40 mg by mouth daily.   5  . metFORMIN (GLUCOPHAGE) 500 MG tablet Take 500 mg by mouth 2 (two) times a day.      No current facility-administered medications for this visit.     ROS: See HPI for pertinent positives and negatives.   Physical Examination  Vitals:   11/24/18 1119  BP: 120/69  Pulse: 91  Resp: 14  Temp: 97.8 F (36.6 C)  TempSrc: Temporal  SpO2: 97%  Weight: 152 lb (68.9 kg)  Height: 5\' 1"  (1.549 m)   Body mass index is 28.72 kg/m.  General: A&O x 3, WDWN, female in NAD. Gait: normal HEENT: No gross abnormalities.  Pulmonary: Respirations are non labored, CTAB, good air movement in all fields, no rales, rhonchi, or wheezes Cardiac: regular rhythm, no detected murmur.         Carotid Bruits Right Left   Negative Negative   Radial pulses are 2+ palpable bilaterally   Adominal aortic pulse is not palpable                         VASCULAR EXAM: Extremities without ischemic changes, without Gangrene; without open wounds.                                                                                                           LE Pulses Right Left       FEMORAL  1+ palpable  1+ palpable        POPLITEAL  not palpable   1+  palpable       POSTERIOR TIBIAL  2+ palpable   2+ palpable        DORSALIS PEDIS      ANTERIOR TIBIAL not palpable  1+ palpable    Abdomen: soft, NT, no palpable masses. Skin: no rashes, no cellulitis, no ulcers noted. Musculoskeletal: no muscle wasting or atrophy.  Neurologic: A&O X 3; appropriate affect, Sensation is normal; MOTOR FUNCTION:  moving all extremities equally, motor strength 5/5 throughout. Speech is fluent/normal. CN 2-12 intact. Psychiatric: Thought content is normal, mood appropriate for clinical situation.    DATA  ABI Findings (11-24-18): +---------+------------------+-----+---------+--------+ Right    Rt Pressure (mmHg)IndexWaveform Comment  +---------+------------------+-----+---------+--------+ Brachial 152                                      +---------+------------------+-----+---------+--------+ PTA      162               1.07 triphasic         +---------+------------------+-----+---------+--------+ DP       154               1.01 triphasic         +---------+------------------+-----+---------+--------+ Great Toe129               0.85                   +---------+------------------+-----+---------+--------+  +---------+------------------+-----+---------+-------+ Left     Lt Pressure (mmHg)IndexWaveform Comment +---------+------------------+-----+---------+-------+ Brachial 152                                     +---------+------------------+-----+---------+-------+ PTA      174               1.14 triphasic        +---------+------------------+-----+---------+-------+ DP       153               1.01 triphasic        +---------+------------------+-----+---------+-------+ Cleotis Nipper               0.84                   +---------+------------------+-----+---------+-------+  +-------+-----------+-----------+------------+------------+ ABI/TBIToday's ABIToday's TBIPrevious ABIPrevious TBI +-------+-----------+-----------+------------+------------+ Right  1.07       0.85       0.97        0.61         +-------+-----------+-----------+------------+------------+ Left   1.14       0.84       1.14        0.63         +-------+-----------+-----------+------------+------------+ Bilateral ABIs appear essentially unchanged compared to prior study on 701/2020. Summary: Right: Resting right ankle-brachial index is within normal range. No evidence of significant right lower extremity arterial disease. The right toe-brachial index is normal. Left: Resting left ankle-brachial index is within normal range. No evidence of significant left lower extremity arterial disease. The left toe-brachial index is normal.   ASSESSMENT: Hannah Mcdaniel is a 53 y.o. female who is s/p angioplasty and stenting of the right common iliac artery (VBX7 mm x 39 mm), and angioplasty and stenting of the right common and external iliac artery (7 mm x 29 mm balloon expandable stent) on 07-16-18 by Dr. Scot Dock for occlusion of  the stent and claudication.  Prior to the above she was s/p right common iliac artery angioplasty and stenting in December 2014. This was a 7 mm x 24 mm balloon expandable stent. She developed a recurrent stenosis which was addressed with angioplasty of an in stent stenosis using a 7 mm balloon in 2017.   She walks a great deal daily for exercise and has no claudication symptoms. There are no signs of ischemia in her feet or legs. Her bilateral pedal pulses are palpable.  ABI's and TBI's today are normal bilaterally with triphasic waveforms.    PLAN:  Based on the patient's vascular studies and examination, pt will return to clinic in 6 months with ABI's. I advised her to notify us if she  develops concern re the circulation in her feet or legs. Continue extensive walking.  I discussed in depth with the patient the nature of atherosclerosis, and emphasized the importance of maximal medical management including strict control of blood pressure, blood glucose, and lipid levels, obtaining regular exercise, and cessation of smoking.  The patient is aware that without maximal medical management the underlying atherosclerotic disease process will progress, limiting the benefit of any interventions.  The patient was given information about PAD including signs, symptoms, treatment, what symptoms should prompt the patient to seek immediate medical care, and risk reduction measures to take.  Clemon Chambers, RN, MSN, FNP-C [facility] Phone: [phone]  Clinic MD: Laqueta Due  11/24/18 11:38 AM

## 2018-11-25 ENCOUNTER — Other Ambulatory Visit: Payer: Self-pay

## 2018-11-25 DIAGNOSIS — I779 Disorder of arteries and arterioles, unspecified: Secondary | ICD-10-CM

## 2019-05-17 ENCOUNTER — Encounter: Payer: Self-pay | Admitting: Family Medicine

## 2019-05-17 ENCOUNTER — Other Ambulatory Visit: Payer: Self-pay

## 2019-05-17 ENCOUNTER — Ambulatory Visit (INDEPENDENT_AMBULATORY_CARE_PROVIDER_SITE_OTHER): Payer: BC Managed Care – PPO | Admitting: Family Medicine

## 2019-05-17 DIAGNOSIS — F411 Generalized anxiety disorder: Secondary | ICD-10-CM

## 2019-05-17 DIAGNOSIS — E1169 Type 2 diabetes mellitus with other specified complication: Secondary | ICD-10-CM

## 2019-05-17 DIAGNOSIS — L0292 Furuncle, unspecified: Secondary | ICD-10-CM

## 2019-05-17 DIAGNOSIS — E785 Hyperlipidemia, unspecified: Secondary | ICD-10-CM

## 2019-05-17 DIAGNOSIS — Z130 Encounter for screening for diseases of the blood and blood-forming organs and certain disorders involving the immune mechanism: Secondary | ICD-10-CM

## 2019-05-17 DIAGNOSIS — F331 Major depressive disorder, recurrent, moderate: Secondary | ICD-10-CM | POA: Diagnosis not present

## 2019-05-17 DIAGNOSIS — I70219 Atherosclerosis of native arteries of extremities with intermittent claudication, unspecified extremity: Secondary | ICD-10-CM

## 2019-05-17 DIAGNOSIS — J302 Other seasonal allergic rhinitis: Secondary | ICD-10-CM

## 2019-05-17 DIAGNOSIS — G43001 Migraine without aura, not intractable, with status migrainosus: Secondary | ICD-10-CM

## 2019-05-17 DIAGNOSIS — E782 Mixed hyperlipidemia: Secondary | ICD-10-CM

## 2019-05-17 DIAGNOSIS — Z1211 Encounter for screening for malignant neoplasm of colon: Secondary | ICD-10-CM

## 2019-05-17 DIAGNOSIS — R42 Dizziness and giddiness: Secondary | ICD-10-CM

## 2019-05-17 MED ORDER — BUPROPION HCL ER (XL) 300 MG PO TB24: 300.0000 mg | ORAL_TABLET | Freq: Every day | ORAL | 2 refills | Status: DC

## 2019-05-17 MED ORDER — SULFAMETHOXAZOLE-TRIMETHOPRIM 800-160 MG PO TABS: 1.0000 | ORAL_TABLET | Freq: Two times a day (BID) | ORAL | 0 refills | Status: AC

## 2019-05-17 MED ORDER — MONTELUKAST SODIUM 10 MG PO TABS: 10.0000 mg | ORAL_TABLET | Freq: Every day | ORAL | 2 refills | Status: DC

## 2019-05-17 NOTE — Progress Notes (Signed)
Virtual Visit via Telephone Note  I connected with Hannah Mcdaniel on 05/17/19 at 9:57 AM by telephone and verified that I am speaking with the correct person using two identifiers. Hannah Mcdaniel is currently located at her aunts and her mother is currently with her during this visit. The provider, [name], FNP is located in their office at time of visit.  I discussed the limitations, risks, security and privacy concerns of performing an evaluation and management service by telephone and the availability of in person appointments. I also discussed with the patient that there may be a patient responsible charge related to this service. The patient expressed understanding and agreed to proceed.  Subjective: PCP: [name], FNP  Chief Complaint  Patient presents with  . New Patient (Initial Visit)  . Establish Care   She is transferring care from Dr. Murrell Redden office as he has retired and the office has closed.   Patient reports she takes Lorazepam daily for anxiety. She is also taking Celexa and Wellbutrin.   Depression screen PHQ 2/9 05/17/2019  Decreased Interest 1  Down, Depressed, Hopeless 3  PHQ - 2 Score 4  Altered sleeping 3  Tired, decreased energy 1  Change in appetite 3  Feeling bad or failure about yourself  3  Trouble concentrating 3  Moving slowly or fidgety/restless 3  Suicidal thoughts 1  PHQ-9 Score 21  Difficult doing work/chores Very difficult   Her feeling is that she would be better off dead. No thoughts of harming herself.   GAD 7 : Generalized Anxiety Score 05/17/2019  Nervous, Anxious, on Edge 3  Control/stop worrying 3  Worry too much - different things 3  Trouble relaxing 3  Restless 3  Easily annoyed or irritable 3  Afraid - awful might happen 3  Total GAD 7 Score 21  Anxiety Difficulty Very difficult    Patient is concerned about her allergies. Taking Zyrtec 10 mg BID and Flonase 1 spray BID. She has been doing this for the  past two weeks.   Patient c/o a boil "down there where it doesn't need to be" on the right side. It is hard as a rock. There is swelling, redness, and warmth. No drainage. Not coming to a head.    ROS: Per HPI  Current Outpatient Medications:  .  buPROPion (WELLBUTRIN XL) 150 MG 24 hr tablet, Take 150 mg by mouth daily., Disp: , Rfl:  .  Choline Fenofibrate (FENOFIBRIC ACID) 135 MG CPDR, Take 135 mg by mouth at bedtime. , Disp: , Rfl:  .  citalopram (CELEXA) 40 MG tablet, Take 40 mg by mouth at bedtime., Disp: , Rfl: 1 .  clopidogrel (PLAVIX) 75 MG tablet, Take 1 tablet (75 mg total) by mouth daily., Disp: 90 tablet, Rfl: 2 .  meclizine (ANTIVERT) 25 MG tablet, Take 25 mg by mouth 3 (three) times daily as needed for dizziness. , Disp: , Rfl:  .  metFORMIN (GLUCOPHAGE) 500 MG tablet, Take 500 mg by mouth daily with breakfast. , Disp: , Rfl:  .  rosuvastatin (CRESTOR) 40 MG tablet, Take 40 mg by mouth daily., Disp: , Rfl:  .  SUMAtriptan (IMITREX) 100 MG tablet, Take 100 mg by mouth every 2 (two) hours as needed for migraine. May repeat in 2 hours if headache persists or recurs., Disp: , Rfl:   Allergies  Allergen Reactions  . Other Other (See Comments)    Sensitive to vaccine per pt.  Diona Fanti [Aspirin]  Rash    Can take low-dose aspirin.  . Codeine Rash  . Penicillins Rash    Has patient had a PCN reaction causing immediate rash, facial/tongue/throat swelling, SOB or lightheadedness with hypotension:Yes Has patient had a PCN reaction causing severe rash involving mucus membranes or skin necrosis:Yes Has patient had a PCN reaction that required hospitalization:No Has patient had a PCN reaction occurring within the last 10 years:No If all of the above answers are "NO", then may proceed with Cephalosporin use.    Past Medical History:  Diagnosis Date  . Anxiety   . Arterial occlusive disease Nov. 2014  . Arthritis   . Claudication of lower extremity [facility]) Nov. 2014   Right Lower  Extremity rest pain  . Depression   . Diabetes mellitus without complication ([place])   . Hyperlipidemia   . Hypertension   . Migraines   . Migraines     Observations/Objective: A&O  No respiratory distress or wheezing audible over the phone Mood, judgement, and thought processes all WNL  Assessment and Plan: 1. Major depressive disorder, recurrent episode, moderate ([place]) - Not well controlled. Wellbutrin increased from 150 mg to 300 mg QD. Also to continue Celexa 40 mg QD.  - buPROPion (WELLBUTRIN XL) 300 MG 24 hr tablet; Take 1 tablet (300 mg total) by mouth daily.  Dispense: 30 tablet; Refill: 2 - CMP14+EGFR  2. Anxiety, generalized - Not well controlled. Wellbutrin increased from 150 mg to 300 mg QD. Also to continue Celexa 40 mg QD. Discussed I do not prescribe Lorazepam for long term management of anxiety. No need to wean as patient has not had a refill of this since 10/06/2017.  - buPROPion (WELLBUTRIN XL) 300 MG 24 hr tablet; Take 1 tablet (300 mg total) by mouth daily.  Dispense: 30 tablet; Refill: 2 - CMP14+EGFR  3. Seasonal allergies - Advised to reduce Zyrtec to 10 mg QD. Added Singulair QHS.  - montelukast (SINGULAIR) 10 MG tablet; Take 1 tablet (10 mg total) by mouth at bedtime.  Dispense: 30 tablet; Refill: 2  4. Boil - Encouraged to use warm compresses QID.  - sulfamethoxazole-trimethoprim (BACTRIM DS) 800-160 MG tablet; Take 1 tablet by mouth 2 (two) times daily for 10 days.  Dispense: 20 tablet; Refill: 0  5. DM type 2 with diabetic dyslipidemia ([place]) - Control is unknown, patient has not had lab work since 2018.  - Medications: continue current medications - Patient is currently taking a statin. Patient is not taking an ACE-inhibitor/ARB.  - Last diabetic eye exam: reports it was recent - record requested from [place].  - Urine Microalbumin/Creat Ratio: 2018 - CMP14+EGFR - Lipid panel - Bayer DCA Hb A1c Waived - Microalbumin / creatinine urine  ratio  6. Mixed hyperlipidemia - Control unknown, patient has not had lab work since 2018.   7. Vertigo - Well controlled on current regimen.   8. Migraine without aura and with status migrainosus, not intractable - Well controlled on current regimen.   9. Atherosclerosis of native artery of lower extremity with intermittent claudication, unspecified laterality ([place]) - Man[facility] by Dr. Scot [facility]   10. Colon cancer screening - Ambulatory referral to Gastroenterology  11. Screening for deficiency anemia - CBC with Differential/Platelet   Patient will come in for labs when she is no longer having respiratory symptoms.    Follow Up Instructions: Return in about 6 weeks (around 06/28/2019) for mood.  I discussed the assessment and treatment plan with the  patient. The patient was provided an opportunity to ask questions and all were answered. The patient agreed with the plan and demonstrated an understanding of the instructions.   The patient was advised to call back or seek an in-person evaluation if the symptoms worsen or if the condition fails to improve as anticipated.  The above assessment and management plan was discussed with the patient. The patient verbalized understanding of and has agreed to the management plan. Patient is aware to call the clinic if symptoms persist or worsen. Patient is aware when to return to the clinic for a follow-up visit. Patient educated on when it is appropriate to go to the emergency department.   Time call ended: 10:33 AM  I provided 38 minutes of non-face-to-face time during this encounter.  Hendricks Limes, MSN, APRN, FNP-C [facility] 05/17/19

## 2019-05-19 ENCOUNTER — Encounter: Payer: Self-pay | Admitting: Nurse Practitioner

## 2019-05-26 ENCOUNTER — Telehealth (HOSPITAL_COMMUNITY): Payer: Self-pay

## 2019-05-26 NOTE — Telephone Encounter (Signed)

## 2019-05-27 ENCOUNTER — Other Ambulatory Visit: Payer: Self-pay

## 2019-05-27 ENCOUNTER — Ambulatory Visit (HOSPITAL_COMMUNITY)
Admission: RE | Admit: 2019-05-27 | Discharge: 2019-05-27 | Disposition: A | Discharge status: Home / Self Care | Payer: BC Managed Care – PPO | Source: Ambulatory Visit | Attending: Vascular Surgery | Admitting: Vascular Surgery

## 2019-05-27 ENCOUNTER — Ambulatory Visit (INDEPENDENT_AMBULATORY_CARE_PROVIDER_SITE_OTHER): Payer: Self-pay | Admitting: Physician Assistant

## 2019-05-27 VITALS — BP 124/71 | HR 77 | Resp 16 | Ht 61.0 in | Wt 159.3 lb

## 2019-05-27 DIAGNOSIS — I70219 Atherosclerosis of native arteries of extremities with intermittent claudication, unspecified extremity: Secondary | ICD-10-CM

## 2019-05-27 DIAGNOSIS — F17200 Nicotine dependence, unspecified, uncomplicated: Secondary | ICD-10-CM | POA: Insufficient documentation

## 2019-05-27 DIAGNOSIS — I779 Disorder of arteries and arterioles, unspecified: Secondary | ICD-10-CM | POA: Diagnosis not present

## 2019-05-27 NOTE — Progress Notes (Signed)
Established Intermittent Claudication   History of Present Illness   Hannah Mcdaniel is a 54 y.o. (March 05, 1965) female who presents to go over vascular studies related to PAD.  Surgical history significant for right common iliac artery stenting in 2014.  She has experienced recurrent stenosis in this area over the years most recently requiring stenting of right common iliac artery with a VBX stent graft and stenting of external iliac artery with balloon expandable stent by Dr. Scot Dock on 07/16/2018.  Patient states she is without claudication symptoms or nonhealing wounds of bilateral lower extremities.  She is taking her aspirin, Plavix, and statin regimen daily.  She is down to 5 cigarettes a day and still has plans to quit.    The patient's PMH, PSH, SH, and FamHx were reviewed and are unchanged from prior visit.  Current Outpatient Medications  Medication Sig Dispense Refill  . buPROPion (WELLBUTRIN XL) 300 MG 24 hr tablet Take 1 tablet (300 mg total) by mouth daily. 30 tablet 2  . Choline Fenofibrate (FENOFIBRIC ACID) 135 MG CPDR Take 135 mg by mouth at bedtime.     . citalopram (CELEXA) 40 MG tablet Take 40 mg by mouth at bedtime.  1  . clopidogrel (PLAVIX) 75 MG tablet Take 1 tablet (75 mg total) by mouth daily. 90 tablet 2  . meclizine (ANTIVERT) 25 MG tablet Take 25 mg by mouth 3 (three) times daily as needed for dizziness.     . metFORMIN (GLUCOPHAGE) 500 MG tablet Take 500 mg by mouth daily with breakfast.     . montelukast (SINGULAIR) 10 MG tablet Take 1 tablet (10 mg total) by mouth at bedtime. 30 tablet 2  . rosuvastatin (CRESTOR) 40 MG tablet Take 40 mg by mouth daily.    Marland Kitchen sulfamethoxazole-trimethoprim (BACTRIM DS) 800-160 MG tablet Take 1 tablet by mouth 2 (two) times daily for 10 days. 20 tablet 0  . SUMAtriptan (IMITREX) 100 MG tablet Take 100 mg by mouth every 2 (two) hours as needed for migraine. May repeat in 2 hours if headache persists or recurs.     No current  facility-administered medications for this visit.    REVIEW OF SYSTEMS (negative unless checked):   Cardiac:  []  Chest pain or chest pressure? []  Shortness of breath upon activity? []  Shortness of breath when lying flat? []  Irregular heart rhythm?  Vascular:  []  Pain in calf, thigh, or hip brought on by walking? []  Pain in feet at night that wakes you up from your sleep? []  Blood clot in your veins? []  Leg swelling?  Pulmonary:  []  Oxygen at home? []  Productive cough? []  Wheezing?  Neurologic:  []  Sudden weakness in arms or legs? []  Sudden numbness in arms or legs? []  Sudden onset of difficult speaking or slurred speech? []  Temporary loss of vision in one eye? []  Problems with dizziness?  Gastrointestinal:  []  Blood in stool? []  Vomited blood?  Genitourinary:  []  Burning when urinating? []  Blood in urine?  Psychiatric:  []  Major depression  Hematologic:  []  Bleeding problems? []  Problems with blood clotting?  Dermatologic:  []  Rashes or ulcers?  Constitutional:  []  Fever or chills?  Ear/Nose/Throat:  []  Change in hearing? []  Nose bleeds? []  Sore throat?  Musculoskeletal:  []  Back pain? []  Joint pain? []  Muscle pain?   Physical Examination   Vitals:   05/27/19 0824  BP: 124/71  Pulse: 77  Resp: 16  SpO2: 96%  Weight: 159 lb 4.8 oz (72.3 kg)  Height: 5\' 1"  (1.549 m)   Body mass index is 30.1 kg/m.  General:  WDWN in NAD; vital signs documented above Gait: Not observed HENT: WNL, normocephalic Pulmonary: normal non-labored breathing , without Rales, rhonchi,  wheezing Cardiac: regular  Abdomen: soft, NT, no masses Skin: without rashes Vascular Exam/Pulses:  Right Left  Radial 2+ (normal) 2+ (normal)  DP absent 2+ (normal)  PT 2+ (normal) 1+ (weak)   Extremities: without ischemic changes, without Gangrene , without cellulitis; without open wounds;  Musculoskeletal: no muscle wasting or atrophy  Neurologic: A&O X 3;  No focal  weakness or paresthesias are detected Psychiatric:  The pt has Normal affect.  Non-Invasive Vascular imaging   ABI  ABI/TBIToday's ABIToday's TBIPrevious ABIPrevious TBI  +-------+-----------+-----------+------------+------------+  Right 0.95    0.59    1.07    0.85      +-------+-----------+-----------+------------+------------+  Left  1.15    0.81    1.14    0.84     Medical Decision Making   Hannah Mcdaniel is a 54 y.o. female who presents for surveillance of PAD with history of right common and external iliac artery stenting   Right iliac system patent with palpable right PT pulse  ABIs unchanged over the past 6 months  Continue aspirin, Plavix, statin  Encouraged smoking cessation  Check aortoiliac duplex and ABIs in 1 year  Call/return office sooner if claudication symptoms return   Dagoberto Ligas PA-C [facility] Office: [phone]  Clinic MD: Donzetta Matters

## 2019-05-31 ENCOUNTER — Other Ambulatory Visit: Payer: Self-pay | Admitting: *Deleted

## 2019-05-31 DIAGNOSIS — I739 Peripheral vascular disease, unspecified: Secondary | ICD-10-CM

## 2019-05-31 DIAGNOSIS — Z95828 Presence of other vascular implants and grafts: Secondary | ICD-10-CM

## 2019-06-01 ENCOUNTER — Telehealth: Payer: Self-pay | Admitting: General Surgery

## 2019-06-01 ENCOUNTER — Ambulatory Visit (INDEPENDENT_AMBULATORY_CARE_PROVIDER_SITE_OTHER): Payer: BC Managed Care – PPO | Admitting: Nurse Practitioner

## 2019-06-01 ENCOUNTER — Encounter: Payer: Self-pay | Admitting: Nurse Practitioner

## 2019-06-01 VITALS — BP 128/68 | HR 76 | Temp 98.2°F | Ht 60.75 in | Wt 160.5 lb

## 2019-06-01 DIAGNOSIS — K219 Gastro-esophageal reflux disease without esophagitis: Secondary | ICD-10-CM

## 2019-06-01 DIAGNOSIS — Z1211 Encounter for screening for malignant neoplasm of colon: Secondary | ICD-10-CM | POA: Diagnosis not present

## 2019-06-01 MED ORDER — SUPREP BOWEL PREP KIT 17.5-3.13-1.6 GM/177ML PO SOLN: 1.0000 | ORAL | 0 refills | Status: DC

## 2019-06-01 NOTE — Telephone Encounter (Signed)
[facility] Pre-operative Risk Assessment     Request for surgical clearance:     Endoscopy Procedure  What type of surgery is being performed?     EGD/Colonoscopy  When is this surgery scheduled?     07/22/2019  What type of clearance is required ?   Pharmacy  Are there any medications that need to be held prior to surgery and how long? Plavix 3-5 days  Practice name and name of physician performing surgery?      [facility]  What is your office phone and fax number?      Phone- [phone]  [phone]  Anesthesia type (None, local, MAC, general) ?       MAC

## 2019-06-01 NOTE — Progress Notes (Signed)
06/01/2019 Hannah Mcdaniel [id number] February 18, 1965   CHIEF COMPLAINT:   Schedule a colonoscopy   HISTORY OF PRESENT ILLNESS:  Hannah Mcdaniel is a 54 year old female with a past medical history of anxiety, depression, arthritis, DM II, peripheral arterial disease s/p iliac artery angioplasty with stent placement 2014 and of s/p right common iliac artery with a VBX stent graft and stenting of external iliac artery with balloon expandable stent by Dr. Scot Dock on 07/16/2018 on Plavix. Past cholecystectomy 2007. She presents today as referred by her PCP Hendricks Limes NP to schedule a screening colonoscopy. She is passing a normal brown bowel movement daily. No rectal bleeding or melena. She underwent a colonoscopy in 1995 at [facility] due to having rectal bleeding which she reported showed a few colon polyps which were removed. She denies having any further colonoscopies. No family history of colon cancer. Significant family history of other cancers, see below. She complains of having severe acid reflux/heartburn for the past 2 months. She denies any specific food or stress triggers. She takes ASA 81mg  daily and Plavix due to having PAD. She denies taking any other NSAIDs. One week ago, her acid reflux was severe which resulted in vomiting up yellow clear emesis for 2 to 3 days. No hematemesis. She started taking Nexium 20mg  otc and her symptoms have lessened. No further vomiting for the past week. Her heartburn has reduced but has not completely abated.  No recent laboratory studies in Epic. Patient stated she is having routine laboratory studies done today as ordered by her PCP.    Past Medical History:  Diagnosis Date  . Anxiety   . Arterial occlusive disease Nov. 2014  . Arthritis   . Claudication of lower extremity [facility]) Nov. 2014   Right Lower Extremity rest pain  . Depression   . Diabetes mellitus without complication ([place])   . Hyperlipidemia   . Migraines   . Vertigo     Past Surgical History:  Procedure Laterality Date  . ABDOMINAL AORTAGRAM N/A 01/10/2013   Procedure: ABDOMINAL AORTAGRAM;  Surgeon: Rosetta Posner, MD;  Location: [facility] CATH LAB;  Service: Cardiovascular;  Laterality: N/A;  . ABDOMINAL AORTOGRAM W/LOWER EXTREMITY Bilateral 07/16/2018   Procedure: ABDOMINAL AORTOGRAM W/LOWER EXTREMITY;  Surgeon: Angelia Mould, MD;  Location: [place] CV LAB;  Service: Cardiovascular;  Laterality: Bilateral;  . ABDOMINAL HYSTERECTOMY    . CHOLECYSTECTOMY     Gall Bladder  . iliac artery angioplasty and stent placement  01/10/13  . LOWER EXTREMITY ANGIOGRAM Bilateral 01/10/2013   Procedure: LOWER EXTREMITY ANGIOGRAM;  Surgeon: Rosetta Posner, MD;  Location: [facility] CATH LAB;  Service: Cardiovascular;  Laterality: Bilateral;  . PERCUTANEOUS STENT INTERVENTION Right 01/10/2013   Procedure: PERCUTANEOUS STENT INTERVENTION;  Surgeon: Rosetta Posner, MD;  Location: [facility] CATH LAB;  Service: Cardiovascular;  Laterality: Right;  rt common iliac stent  . PERIPHERAL VASCULAR CATHETERIZATION N/A 11/26/2015   Procedure: Abdominal Aortogram w/Lower Extremity;  Surgeon: Angelia Mould, MD;  Location: [place] CV LAB;  Service: Cardiovascular;  Laterality: N/A;  . PERIPHERAL VASCULAR CATHETERIZATION Right 11/26/2015   Procedure: Peripheral Vascular Balloon Angioplasty;  Surgeon: Angelia Mould, MD;  Location: [place] CV LAB;  Service: Cardiovascular;  Laterality: Right;  rt common iliac  . PERIPHERAL VASCULAR INTERVENTION  07/16/2018   Procedure: PERIPHERAL VASCULAR INTERVENTION;  Surgeon: Angelia Mould, MD;  Location: [place] CV LAB;  Service: Cardiovascular;;    Social History:  Married. She has 1 son and 2 daughters. Smokes cigarettes since age age 70. She has weaned down to 3 cigarettes daily which has been successful on Wellbutrin. One mixed drink every 3 months. No drug use.    Family History: Materna grandmother breast cancer. Mother and  maternal grandmother with ovarian cancer and CVA. Dad with history of melanoma and acute leukemia. Paternal grandfather died from lung cancer. Maternal cousin died from CVA.    Allergies  Allergen Reactions  . Other Other (See Comments)    Sensitive to vaccine per pt.  Diona Fanti [Aspirin] Rash    Can take low-dose aspirin.  . Codeine Rash  . Penicillins Rash    Has patient had a PCN reaction causing immediate rash, facial/tongue/throat swelling, SOB or lightheadedness with hypotension:Yes Has patient had a PCN reaction causing severe rash involving mucus membranes or skin necrosis:Yes Has patient had a PCN reaction that required hospitalization:No Has patient had a PCN reaction occurring within the last 10 years:No If all of the above answers are "NO", then may proceed with Cephalosporin use.       Outpatient Encounter Medications as of 06/01/2019  Medication Sig  . buPROPion (WELLBUTRIN XL) 300 MG 24 hr tablet Take 1 tablet (300 mg total) by mouth daily.  . Choline Fenofibrate (FENOFIBRIC ACID) 135 MG CPDR Take 135 mg by mouth at bedtime.   . citalopram (CELEXA) 40 MG tablet Take 40 mg by mouth at bedtime.  . clopidogrel (PLAVIX) 75 MG tablet Take 1 tablet (75 mg total) by mouth daily.  . meclizine (ANTIVERT) 25 MG tablet Take 25 mg by mouth 3 (three) times daily as needed for dizziness.   . metFORMIN (GLUCOPHAGE) 500 MG tablet Take 500 mg by mouth daily with breakfast.   . montelukast (SINGULAIR) 10 MG tablet Take 1 tablet (10 mg total) by mouth at bedtime.  . rosuvastatin (CRESTOR) 40 MG tablet Take 40 mg by mouth daily.  . SUMAtriptan (IMITREX) 100 MG tablet Take 100 mg by mouth every 2 (two) hours as needed for migraine. May repeat in 2 hours if headache persists or recurs.   No facility-administered encounter medications on file as of 06/01/2019.    REVIEW OF SYSTEMS: All other systems reviewed and negative except where noted in the History of Present Illness.  PHYSICAL EXAM: BP  128/68 (BP Location: Left Arm, Patient Position: Sitting, Cuff Size: Normal)   Pulse 76   Temp 98.2 F (36.8 C)   Ht 5' 0.75" (1.543 m) Comment: height measured without shoes  Wt 160 lb 8 oz (72.8 kg)   BMI 30.58 kg/m  General: Well developed  54 year old female in no acute distress. Head: Normocephalic and atraumatic. Eyes:  Sclerae non-icteric, conjunctive pink. Ears: Normal auditory acuity. Mouth: Very poor dentition, s/p 2 dental extractions low mandible without inflammation/erythema or exudate. No ulcers or lesions.  Neck: Supple, no lymphadenopathy or thyromegaly.  Lungs: Clear bilaterally to auscultation without wheezes, crackles or rhonchi. Heart: Regular rate and rhythm. No murmur, rub or gallop appreciated.  Abdomen: Soft, nontender, non distended. No masses. No hepatosplenomegaly. Normoactive bowel sounds x 4 quadrants.  Rectal: Deferred.  Musculoskeletal: Symmetrical with no gross deformities. Skin: Warm and dry. No rash or lesions on visible extremities. Extremities: No edema. Neurological: Alert oriented x 4, no focal deficits.  Psychological:  Alert and cooperative. Normal mood and affect.  ASSESSMENT AND PLAN:  65. 54 year old female presents to schedule a screening colonoscopy -Colonoscopy  benefits and risks  discussed including risk with sedation, risk of bleeding, perforation and infection  -Our office will contact her vascular surgeon Dr. Scot Dock to verify Plavix instructions prior to colonoscopy   2. GERD symptoms. -EGD to be done at time of colonoscopy. EGD benefits and risks discussed including risk with sedation, risk of bleeding, perforation and infection  -Increase Esomeprazole 20mg  to bid for now -GERD diet discussed -Patient to call our office if her symptoms worsen   3. Peripheral arterial disease on Plavix and ASA   Further follow up to be determined after EGD and colonoscopy completed        CC:  [name], FNP

## 2019-06-01 NOTE — Progress Notes (Signed)
I agree with the above note, plan 

## 2019-06-01 NOTE — Patient Instructions (Signed)
If you are age 55 or older, your body mass index should be between 23-30. Your Body mass index is 30.58 kg/m. If this is out of the aforementioned range listed, please consider follow up with your Primary Care Provider.  If you are age 68 or younger, your body mass index should be between 19-25. Your Body mass index is 30.58 kg/m. If this is out of the aformentioned range listed, please consider follow up with your Primary Care Provider.   You have been scheduled for an endoscopy and colonoscopy. Please follow the written instructions given to you at your visit today. Please pick up your prep supplies at the pharmacy within the next 1-3 days. If you use inhalers (even only as needed), please bring them with you on the day of your procedure.  We have sent the following medications to your pharmacy for you to pick up at your convenience:  Paul will be contaced by our office prior to your procedure for directions on holding your Xarelto.  If you do not hear from our office 1 week prior to your scheduled procedure, please call [phone] to discuss.  Increase your esomeprazole to 20 mg twice a day.  Gastroesophageal Reflux Disease, Adult Gastroesophageal reflux (GER) happens when acid from the stomach flows up into the tube that connects the mouth and the stomach (esophagus). Normally, food travels down the esophagus and stays in the stomach to be digested. With GER, food and stomach acid sometimes move back up into the esophagus. You may have a disease called gastroesophageal reflux disease (GERD) if the reflux: Happens often. Causes frequent or very bad symptoms. Causes problems such as damage to the esophagus. When this happens, the esophagus becomes sore and swollen (inflamed). Over time, GERD can make small holes (ulcers) in the lining of the esophagus. What are the causes? This condition is caused by a problem with the muscle between the esophagus and the stomach. When this muscle  is weak or not normal, it does not close properly to keep food and acid from coming back up from the stomach. The muscle can be weak because of: Tobacco use. Pregnancy. Having a certain type of hernia (hiatal hernia). Alcohol use. Certain foods and drinks, such as coffee, chocolate, onions, and peppermint. What increases the risk? You are more likely to develop this condition if you: Are overweight. Have a disease that affects your connective tissue. Use NSAID medicines. What are the signs or symptoms? Symptoms of this condition include: Heartburn. Difficult or painful swallowing. The feeling of having a lump in the throat. A bitter taste in the mouth. Bad breath. Having a lot of saliva. Having an upset or bloated stomach. Belching. Chest pain. Different conditions can cause chest pain. Make sure you see your doctor if you have chest pain. Shortness of breath or noisy breathing (wheezing). Ongoing (chronic) cough or a cough at night. Wearing away of the surface of teeth (tooth enamel). Weight loss. How is this treated? Treatment will depend on how bad your symptoms are. Your doctor may suggest: Changes to your diet. Medicine. Surgery. Follow these instructions at home: Eating and drinking  Follow a diet as told by your doctor. You may need to avoid foods and drinks such as: Coffee and tea (with or without caffeine). Drinks that contain alcohol. Energy drinks and sports drinks. Bubbly (carbonated) drinks or sodas. Chocolate and cocoa. Peppermint and mint flavorings. Garlic and onions. Horseradish. Spicy and acidic foods. These include peppers, chili powder, curry powder,  vinegar, hot sauces, and BBQ sauce. Citrus fruit juices and citrus fruits, such as oranges, lemons, and limes. Tomato-based foods. These include red sauce, chili, salsa, and pizza with red sauce. Fried and fatty foods. These include donuts, french fries, potato chips, and high-fat dressings. High-fat  meats. These include hot dogs, rib eye steak, sausage, ham, and bacon. High-fat dairy items, such as whole milk, butter, and cream cheese. Eat small meals often. Avoid eating large meals. Avoid drinking large amounts of liquid with your meals. Avoid eating meals during the 2-3 hours before bedtime. Avoid lying down right after you eat. Do not exercise right after you eat. Lifestyle  Do not use any products that contain nicotine or tobacco. These include cigarettes, e-cigarettes, and chewing tobacco. If you need help quitting, ask your doctor. Try to lower your stress. If you need help doing this, ask your doctor. If you are overweight, lose an amount of weight that is healthy for you. Ask your doctor about a safe weight loss goal. General instructions Pay attention to any changes in your symptoms. Take over-the-counter and prescription medicines only as told by your doctor. Do not take aspirin, ibuprofen, or other NSAIDs unless your doctor says it is okay. Wear loose clothes. Do not wear anything tight around your waist. Raise (elevate) the head of your bed about 6 inches (15 cm). Avoid bending over if this makes your symptoms worse. Keep all follow-up visits as told by your doctor. This is important. Contact a doctor if: You have new symptoms. You lose weight and you do not know why. You have trouble swallowing or it hurts to swallow. You have wheezing or a cough that keeps happening. Your symptoms do not get better with treatment. You have a hoarse voice. Get help right away if: You have pain in your arms, neck, jaw, teeth, or back. You feel sweaty, dizzy, or light-headed. You have chest pain or shortness of breath. You throw up (vomit) and your throw-up looks like blood or coffee grounds. You pass out (faint). Your poop (stool) is bloody or black. You cannot swallow, drink, or eat. Summary If a person has gastroesophageal reflux disease (GERD), food and stomach acid move back up  into the esophagus and cause symptoms or problems such as damage to the esophagus. Treatment will depend on how bad your symptoms are. Follow a diet as told by your doctor. Take all medicines only as told by your doctor. This information is not intended to replace advice given to you by your health care provider. Make sure you discuss any questions you have with your health care provider. Document Revised: 07/29/2017 Document Reviewed: 07/29/2017 Elsevier Patient Education  2020 [employer] for Gastroesophageal Reflux Disease, Adult When you have gastroesophageal reflux disease (GERD), the foods you eat and your eating habits are very important. Choosing the right foods can help ease your discomfort. Think about working with a nutrition specialist (dietitian) to help you make good choices. What are tips for following this plan?  Meals  Choose healthy foods that are low in fat, such as fruits, vegetables, whole grains, low-fat dairy products, and lean meat, fish, and poultry.  Eat small meals often instead of 3 large meals a day. Eat your meals slowly, and in a place where you are relaxed. Avoid bending over or lying down until 2-3 hours after eating.  Avoid eating meals 2-3 hours before bed.  Avoid drinking a lot of liquid with meals.  Cook foods using methods  other than frying. Bake, grill, or broil food instead.  Avoid or limit: ? Chocolate. ? Peppermint or spearmint. ? Alcohol. ? Pepper. ? Black and decaffeinated coffee. ? Black and decaffeinated tea. ? Bubbly (carbonated) soft drinks. ? Caffeinated energy drinks and soft drinks.  Limit high-fat foods such as: ? Fatty meat or fried foods. ? Whole milk, cream, butter, or ice cream. ? Nuts and nut butters. ? Pastries, donuts, and sweets made with butter or shortening.  Avoid foods that cause symptoms. These foods may be different for everyone. Common foods that cause symptoms include: ? Tomatoes. ? Oranges,  lemons, and limes. ? Peppers. ? Spicy food. ? Onions and garlic. ? Vinegar. Lifestyle  Maintain a healthy weight. Ask your doctor what weight is healthy for you. If you need to lose weight, work with your doctor to do so safely.  Exercise for at least 30 minutes for 5 or more days each week, or as told by your doctor.  Wear loose-fitting clothes.  Do not smoke. If you need help quitting, ask your doctor.  Sleep with the head of your bed higher than your feet. Use a wedge under the mattress or blocks under the bed frame to raise the head of the bed. Summary  When you have gastroesophageal reflux disease (GERD), food and lifestyle choices are very important in easing your symptoms.  Eat small meals often instead of 3 large meals a day. Eat your meals slowly, and in a place where you are relaxed.  Limit high-fat foods such as fatty meat or fried foods.  Avoid bending over or lying down until 2-3 hours after eating.  Avoid peppermint and spearmint, caffeine, alcohol, and chocolate. This information is not intended to replace advice given to you by your health care provider. Make sure you discuss any questions you have with your health care provider. Document Revised: 05/13/2018 Document Reviewed: 02/26/2016 Elsevier Patient Education  [employer].  Due to recent changes in healthcare laws, you may see the results of your imaging and laboratory studies on MyChart before your provider has had a chance to review them.  We understand that in some cases there may be results that are confusing or concerning to you. Not all laboratory results come back in the same time frame and the provider may be waiting for multiple results in order to interpret others.  Please give [place] 48 hours in order for your provider to thoroughly review all the results before contacting the office for clarification of your results.   Thank you for choosing [facility] Noralyn Pick,  CRNP

## 2019-06-08 ENCOUNTER — Other Ambulatory Visit: Payer: Self-pay | Admitting: Family Medicine

## 2019-06-08 DIAGNOSIS — F331 Major depressive disorder, recurrent, moderate: Secondary | ICD-10-CM

## 2019-06-08 DIAGNOSIS — F411 Generalized anxiety disorder: Secondary | ICD-10-CM

## 2019-06-14 NOTE — Telephone Encounter (Signed)
   Primary Cardiologist: No primary care provider on file.  Chart reviewed as part of pre-operative protocol coverage.   This does not appear to be a patient on file. We do not manage their Plavix.  Appears sender is trying to reach Dr. Scot Dock, who is not a provider in our practice. Recommend reaching out to Vascular Surgery for further needs.   Please let [facility] know if there are any cardiology needs and we can schedule as a new patient.  Will route back to sender and remove from pre-op box.  Charlie Pitter, PA-C 06/14/2019, 1:50 PM

## 2019-06-24 NOTE — Telephone Encounter (Signed)
Tried to contact the patient to see who the presciber is for her Plavix, no voicemail set up. Unable to leave a message

## 2019-06-29 ENCOUNTER — Ambulatory Visit (INDEPENDENT_AMBULATORY_CARE_PROVIDER_SITE_OTHER): Payer: BC Managed Care – PPO | Admitting: Family Medicine

## 2019-06-29 ENCOUNTER — Encounter: Payer: Self-pay | Admitting: Family Medicine

## 2019-06-29 ENCOUNTER — Other Ambulatory Visit: Payer: Self-pay

## 2019-06-29 DIAGNOSIS — F411 Generalized anxiety disorder: Secondary | ICD-10-CM

## 2019-06-29 DIAGNOSIS — F331 Major depressive disorder, recurrent, moderate: Secondary | ICD-10-CM

## 2019-06-29 LAB — BAYER DCA HB A1C WAIVED: HB A1C (BAYER DCA - WAIVED): 8.4 % — ABNORMAL HIGH (ref <7.0)

## 2019-06-29 MED ORDER — SUMATRIPTAN SUCCINATE 100 MG PO TABS: 100.0000 mg | ORAL_TABLET | ORAL | 2 refills | Status: DC | PRN

## 2019-06-29 MED ORDER — BUPROPION HCL ER (XL) 300 MG PO TB24: 300.0000 mg | ORAL_TABLET | Freq: Every day | ORAL | 0 refills | Status: DC

## 2019-06-29 MED ORDER — BUPROPION HCL ER (XL) 150 MG PO TB24: 150.0000 mg | ORAL_TABLET | Freq: Every day | ORAL | 2 refills | Status: DC

## 2019-06-29 NOTE — Progress Notes (Signed)
Assessment & Plan:  1-2. Major depressive disorder, recurrent episode, moderate (HCC)/Anxiety, generalized - Improving. Continue Celexa 40 mg QD. Increase Wellbutrin from 300 mg to 450 mg QD.  - buPROPion (WELLBUTRIN XL) 300 MG 24 hr tablet; Take 1 tablet (300 mg total) by mouth daily. Take in addition to 150 mg for a total of 450 mg daily.  Dispense: 90 tablet; Refill: 0   Return as directed after labs result.  Hendricks Limes, MSN, APRN, FNP-C [facility]  Subjective:    Patient ID: Hannah Mcdaniel, female    DOB: 08/26/1965, 54 y.o.   MRN: [id number]  Patient Care Team: [name], FNP as PCP - General (Family Medicine) Angelia Mould, MD as Consulting Physician (Vascular Surgery)   Chief Complaint:  Chief Complaint  Patient presents with  . Depression    6 week f/u    HPI: Hannah Mcdaniel is a 55 y.o. female presenting on 06/29/2019 for Depression (6 week f/u)  Patient is here for follow-up of anxiety and depression.  At her last visit approximately 6 weeks ago she was continued on Celexa 40 mg once daily and her Wellbutrin was increased from 150 mg to 300 mg once daily. Patient reports she is feeling better but could use a dosage increase.   Depression screen [facility] 2/9 06/29/2019 05/17/2019  Decreased Interest 3 1  Down, Depressed, Hopeless 3 3  PHQ - 2 Score 6 4  Altered sleeping 3 3  Tired, decreased energy 3 1  Change in appetite 3 3  Feeling bad or failure about yourself  0 3  Trouble concentrating 3 3  Moving slowly or fidgety/restless 3 3  Suicidal thoughts 0 1  PHQ-9 Score 21 21  Difficult doing work/chores Somewhat difficult Very difficult   GAD 7 : Generalized Anxiety Score 06/29/2019 05/17/2019  Nervous, Anxious, on Edge 3 3  Control/stop worrying 3 3  Worry too much - different things 2 3  Trouble relaxing 3 3  Restless 3 3  Easily annoyed or irritable 3 3  Afraid - awful might happen 0 3  Total GAD 7 Score 17 21   Anxiety Difficulty Somewhat difficult Very difficult    Patient is down to 3 cigarettes per day.   New complaints: None  Social history:  Relevant past medical, surgical, family and social history reviewed and updated as indicated. Interim medical history since our last visit reviewed.  Allergies and medications reviewed and updated.  DATA REVIEWED: CHART IN EPIC  ROS: Negative unless specifically indicated above in HPI.    Current Outpatient Medications:  .  aspirin EC 81 MG tablet, Take 81 mg by mouth daily., Disp: , Rfl:  .  buPROPion (WELLBUTRIN XL) 300 MG 24 hr tablet, Take 1 tablet (300 mg total) by mouth daily. Take in addition to 150 mg for a total of 450 mg daily., Disp: 90 tablet, Rfl: 0 .  Choline Fenofibrate (FENOFIBRIC ACID) 135 MG CPDR, Take 135 mg by mouth at bedtime. , Disp: , Rfl:  .  citalopram (CELEXA) 40 MG tablet, Take 40 mg by mouth at bedtime., Disp: , Rfl: 1 .  clopidogrel (PLAVIX) 75 MG tablet, Take 1 tablet (75 mg total) by mouth daily., Disp: 90 tablet, Rfl: 2 .  meclizine (ANTIVERT) 25 MG tablet, Take 25 mg by mouth 3 (three) times daily as needed for dizziness. , Disp: , Rfl:  .  metFORMIN (GLUCOPHAGE) 500 MG tablet, Take 500 mg by mouth daily with  breakfast. , Disp: , Rfl:  .  montelukast (SINGULAIR) 10 MG tablet, Take 1 tablet (10 mg total) by mouth at bedtime., Disp: 30 tablet, Rfl: 2 .  rosuvastatin (CRESTOR) 40 MG tablet, Take 40 mg by mouth daily., Disp: , Rfl:  .  SUMAtriptan (IMITREX) 100 MG tablet, Take 1 tablet (100 mg total) by mouth every 2 (two) hours as needed for migraine. May repeat in 2 hours if headache persists or recurs., Disp: 10 tablet, Rfl: 2 .  buPROPion (WELLBUTRIN XL) 150 MG 24 hr tablet, Take 1 tablet (150 mg total) by mouth daily. Take in addition to the 300 mg for a total of 450 mg daily., Disp: 30 tablet, Rfl: 2 .  SUPREP BOWEL PREP KIT 17.5-3.13-1.6 GM/177ML SOLN, Take 1 kit by mouth as directed. For colonoscopy prep  (Patient not taking: Reported on 06/29/2019), Disp: 177 mL, Rfl: 0   Allergies  Allergen Reactions  . Asa [Aspirin] Rash    Can take low-dose aspirin.  . Codeine Rash  . Penicillins Rash    Has patient had a PCN reaction causing immediate rash, facial/tongue/throat swelling, SOB or lightheadedness with hypotension:Yes Has patient had a PCN reaction causing severe rash involving mucus membranes or skin necrosis:Yes Has patient had a PCN reaction that required hospitalization:No Has patient had a PCN reaction occurring within the last 10 years:No If all of the above answers are "NO", then may proceed with Cephalosporin use.    Past Medical History:  Diagnosis Date  . Anxiety   . Arterial occlusive disease Nov. 2014  . Arthritis   . Claudication of lower extremity [facility]) Nov. 2014   Right Lower Extremity rest pain  . Colon polyps   . Depression   . Diabetes mellitus without complication ([place])   . Hyperlipidemia   . Migraines   . Vertigo     Past Surgical History:  Procedure Laterality Date  . ABDOMINAL AORTAGRAM N/A 01/10/2013   Procedure: ABDOMINAL AORTAGRAM;  Surgeon: Rosetta Posner, MD;  Location: [facility] CATH LAB;  Service: Cardiovascular;  Laterality: N/A;  . ABDOMINAL AORTOGRAM W/LOWER EXTREMITY Bilateral 07/16/2018   Procedure: ABDOMINAL AORTOGRAM W/LOWER EXTREMITY;  Surgeon: Angelia Mould, MD;  Location: [facility] CV LAB;  Service: Cardiovascular;  Laterality: Bilateral;  . ABDOMINAL HYSTERECTOMY    . CHOLECYSTECTOMY     Gall Bladder  . iliac artery angioplasty and stent placement  01/10/13  . LOWER EXTREMITY ANGIOGRAM Bilateral 01/10/2013   Procedure: LOWER EXTREMITY ANGIOGRAM;  Surgeon: Rosetta Posner, MD;  [facility] C[facility] LAB;  Service: Cardiovascular;  Laterality: Bilateral;  . PERCUTANEOUS STENT INTERVENTION Right 01/10/2013   Procedure: PERCUTANEOUS STENT INTERVENTION;  Surgeon: Rosetta Posner, MD;  Location: [facility] CATH LAB;  Service: Cardiovascular;  Laterality: Right;   rt common iliac stent  . PERIPHERAL VASCULAR CATHETERIZATION N/A 11/26/2015   Procedure: Abdominal Aortogram w/Lower Extremity;  Surgeon: Angelia Mould, MD;  Location: [place] CV LAB;  Service: Cardiovascular;  Laterality: N/A;  . PERIPHERAL VASCULAR CATHETERIZATION Right 11/26/2015   Procedure: Peripheral Vascular Balloon Angioplasty;  Surgeon: Angelia Mould, MD;  Location: [place] CV LAB;  Service: Cardiovascular;  Laterality: Right;  rt common iliac  . PERIPHERAL VASCULAR INTERVENTION  07/16/2018   Procedure: PERIPHERAL VASCULAR INTERVENTION;  Surgeon: Angelia Mould, MD;  Location: [place] CV LAB;  Service: Cardiovascular;;    Social History   Socioeconomic History  . Marital status: Married    Spouse name: Not on file  . Number of  children: 3  . Years of education: Not on file  . Highest education level: Not on file  Occupational History  . Not on file  Tobacco Use  . Smoking status: Current Every Day Smoker    Packs/day: 0.25    Types: Cigarettes    Start date: 05/16/1977  . Smokeless tobacco: Never Used  . Tobacco comment: 1/2 cigarette per day  Substance and Sexual Activity  . Alcohol use: Yes    Alcohol/week: 1.0 standard drinks    Types: 1 Shots of liquor per week    Comment: occ  . Drug use: No  . Sexual activity: Yes    Partners: Male    Birth control/protection: Surgical  Other Topics Concern  . Not on file  Social History Narrative  . Not on file   Social Determinants of Health   Financial Resource Strain:   . Difficulty of Paying Living Expenses:   Food Insecurity:   . Worried About Charity fundraiser in the Last Year:   . Arboriculturist in the Last Year:   Transportation Needs:   . Film/video editor (Medical):   Marland Kitchen Lack of Transportation (Non-Medical):   Physical Activity:   . Days of Exercise per Week:   . Minutes of Exercise per Session:   Stress:   . Feeling of Stress :   Social Connections:   .  Frequency of Communication with Friends and Family:   . Frequency of Social Gatherings with Friends and Family:   . Attends Religious Services:   . Active Member of Clubs or Organizations:   . Attends Archivist Meetings:   Marland Kitchen Marital Status:   Intimate Partner Violence:   . Fear of Current or Ex-Partner:   . Emotionally Abused:   Marland Kitchen Physically Abused:   . Sexually Abused:         Objective:    BP 112/70   Pulse 86   Temp 98.1 F (36.7 C) (Temporal)   Ht 5' 0.75" (1.543 m)   Wt 160 lb 6.4 oz (72.8 kg)   BMI 30.56 kg/m   Wt Readings from Last 3 Encounters:  06/29/19 160 lb 6.4 oz (72.8 kg)  06/01/19 160 lb 8 oz (72.8 kg)  05/27/19 159 lb 4.8 oz (72.3 kg)    Physical Exam Vitals reviewed.  Constitutional:      General: She is not in acute distress.    Appearance: Normal appearance. She is obese. She is not ill-appearing, toxic-appearing or diaphoretic.  HENT:     Head: Normocephalic and atraumatic.  Eyes:     General: No scleral icterus.       Right eye: No discharge.        Left eye: No discharge.     Conjunctiva/sclera: Conjunctivae normal.  Cardiovascular:     Rate and Rhythm: Normal rate and regular rhythm.     Heart sounds: Normal heart sounds. No murmur. No friction rub. No gallop.   Pulmonary:     Effort: Pulmonary effort is normal. No respiratory distress.     Breath sounds: Normal breath sounds. No stridor. No wheezing, rhonchi or rales.  Musculoskeletal:        General: Normal range of motion.     Cervical back: Normal range of motion.  Skin:    General: Skin is warm and dry.     Capillary Refill: Capillary refill takes less than 2 seconds.  Neurological:     General: No focal deficit present.  Mental Status: She is alert and oriented to person, place, and time. Mental status is at baseline.  Psychiatric:        Mood and Affect: Mood normal.        Behavior: Behavior normal.        Thought Content: Thought content normal.         Judgment: Judgment normal.     No results found for: TSH Lab Results  Component Value Date   HGB 14.3 07/16/2018   HCT 42.0 07/16/2018   Lab Results  Component Value Date   NA 138 07/16/2018   K 4.0 07/16/2018   GLUCOSE 175 (H) 07/16/2018   BUN 16 07/16/2018   CREATININE 0.60 07/16/2018   No results found for: CHOL No results found for: HDL No results found for: LDLCALC No results found for: TRIG No results found for: CHOLHDL No results found for: HGBA1C

## 2019-06-30 ENCOUNTER — Other Ambulatory Visit: Payer: Self-pay | Admitting: Family Medicine

## 2019-06-30 ENCOUNTER — Telehealth: Payer: Self-pay | Admitting: Family Medicine

## 2019-06-30 DIAGNOSIS — E785 Hyperlipidemia, unspecified: Secondary | ICD-10-CM

## 2019-06-30 LAB — CBC WITH DIFFERENTIAL/PLATELET
Basophils Absolute: 0.1 10*3/uL (ref 0.0–0.2)
Basos: 1 %
EOS (ABSOLUTE): 0.1 10*3/uL (ref 0.0–0.4)
Eos: 1 %
Hematocrit: 42 % (ref 34.0–46.6)
Hemoglobin: 14.8 g/dL (ref 11.1–15.9)
Immature Grans (Abs): 0 10*3/uL (ref 0.0–0.1)
Immature Granulocytes: 0 %
Lymphocytes Absolute: 4.3 10*3/uL — ABNORMAL HIGH (ref 0.7–3.1)
Lymphs: 47 %
MCH: 32 pg (ref 26.6–33.0)
MCHC: 35.2 g/dL (ref 31.5–35.7)
MCV: 91 fL (ref 79–97)
Monocytes Absolute: 0.5 10*3/uL (ref 0.1–0.9)
Monocytes: 5 %
Neutrophils Absolute: 4.2 10*3/uL (ref 1.4–7.0)
Neutrophils: 46 %
Platelets: 287 10*3/uL (ref 150–450)
RBC: 4.63 x10E6/uL (ref 3.77–5.28)
RDW: 12.1 % (ref 11.7–15.4)
WBC: 9.1 10*3/uL (ref 3.4–10.8)

## 2019-06-30 LAB — CMP14+EGFR
ALT: 19 IU/L (ref 0–32)
AST: 21 IU/L (ref 0–40)
Albumin/Globulin Ratio: 1.7 (ref 1.2–2.2)
Albumin: 4.4 g/dL (ref 3.8–4.9)
Alkaline Phosphatase: 125 IU/L — ABNORMAL HIGH (ref 48–121)
BUN/Creatinine Ratio: 18 (ref 9–23)
BUN: 13 mg/dL (ref 6–24)
Bilirubin Total: 0.3 mg/dL (ref 0.0–1.2)
CO2: 22 mmol/L (ref 20–29)
Calcium: 9.6 mg/dL (ref 8.7–10.2)
Chloride: 104 mmol/L (ref 96–106)
Creatinine, Ser: 0.71 mg/dL (ref 0.57–1.00)
GFR calc Af Amer: 112 mL/min/{1.73_m2} (ref >59)
GFR calc non Af Amer: 97 mL/min/{1.73_m2} (ref >59)
Globulin, Total: 2.6 g/dL (ref 1.5–4.5)
Glucose: 208 mg/dL — ABNORMAL HIGH (ref 65–99)
Potassium: 4 mmol/L (ref 3.5–5.2)
Sodium: 140 mmol/L (ref 134–144)
Total Protein: 7 g/dL (ref 6.0–8.5)

## 2019-06-30 LAB — LIPID PANEL
Chol/HDL Ratio: 5.1 ratio — ABNORMAL HIGH (ref 0.0–4.4)
Cholesterol, Total: 211 mg/dL — ABNORMAL HIGH (ref 100–199)
HDL: 41 mg/dL (ref >39)
LDL Chol Calc (NIH): 110 mg/dL — ABNORMAL HIGH (ref 0–99)
Triglycerides: 348 mg/dL — ABNORMAL HIGH (ref 0–149)
VLDL Cholesterol Cal: 60 mg/dL — ABNORMAL HIGH (ref 5–40)

## 2019-06-30 LAB — MICROALBUMIN / CREATININE URINE RATIO
Creatinine, Urine: 144.6 mg/dL
Microalb/Creat Ratio: 5 mg/g creat (ref 0–29)
Microalbumin, Urine: 7.4 ug/mL

## 2019-06-30 MED ORDER — METFORMIN HCL 500 MG PO TABS: 500.0000 mg | ORAL_TABLET | Freq: Two times a day (BID) | ORAL | 1 refills | Status: DC

## 2019-06-30 NOTE — Telephone Encounter (Signed)
Pt would like a call to discuss her lab results.  °

## 2019-06-30 NOTE — Telephone Encounter (Signed)
Labs revived with patient and she verbalizes understanding.

## 2019-07-07 NOTE — Telephone Encounter (Signed)
Attempted to reach patient. Unfortunately, no answer. Voicemail is full.

## 2019-07-11 NOTE — Telephone Encounter (Signed)
Pt called back and stated that Dr. [name] prescribed her Plavix.

## 2019-07-11 NOTE — Telephone Encounter (Signed)
Called the patient and expressed to her that according to Dr Mathis Fare notes he prescribed enough Plavix to take her to April 2021. The patient now admits she has not been taking her plavix since then and has tried to contact Dr Mathis Fare office for a refill. She will contact our office back in regards to her Plavix, patient is to have a procedure on 07/22/2019.

## 2019-07-11 NOTE — Telephone Encounter (Signed)
Contacted the patient and explained we have been trying to get ahold of her regarding her plavix. She stated she was uncertain who is prescribing it now and will go home and look at her bottle then call me back. I explained it is getting very close to her procedure and we need to figure this out so her procedure is not cancelled. The patient verbalized understanding.

## 2019-07-15 ENCOUNTER — Encounter: Payer: Self-pay | Admitting: Gastroenterology

## 2019-07-20 ENCOUNTER — Other Ambulatory Visit: Payer: Self-pay | Admitting: Gastroenterology

## 2019-07-20 ENCOUNTER — Ambulatory Visit (INDEPENDENT_AMBULATORY_CARE_PROVIDER_SITE_OTHER): Payer: BC Managed Care – PPO

## 2019-07-20 DIAGNOSIS — Z1159 Encounter for screening for other viral diseases: Secondary | ICD-10-CM

## 2019-07-21 LAB — SARS CORONAVIRUS 2 (TAT 6-24 HRS): SARS Coronavirus 2: NEGATIVE

## 2019-07-22 ENCOUNTER — Ambulatory Visit (AMBULATORY_SURGERY_CENTER): Payer: BC Managed Care – PPO | Admitting: Gastroenterology

## 2019-07-22 ENCOUNTER — Encounter: Payer: Self-pay | Admitting: Gastroenterology

## 2019-07-22 ENCOUNTER — Other Ambulatory Visit: Payer: Self-pay

## 2019-07-22 VITALS — BP 151/68 | HR 70 | Temp 97.3°F | Resp 24 | Ht 60.0 in | Wt 160.0 lb

## 2019-07-22 DIAGNOSIS — D123 Benign neoplasm of transverse colon: Secondary | ICD-10-CM | POA: Diagnosis not present

## 2019-07-22 DIAGNOSIS — K2970 Gastritis, unspecified, without bleeding: Secondary | ICD-10-CM

## 2019-07-22 DIAGNOSIS — Z1211 Encounter for screening for malignant neoplasm of colon: Secondary | ICD-10-CM

## 2019-07-22 DIAGNOSIS — K219 Gastro-esophageal reflux disease without esophagitis: Secondary | ICD-10-CM

## 2019-07-22 MED ORDER — SODIUM CHLORIDE 0.9 % IV SOLN: 500.0000 mL | INTRAVENOUS | Status: DC

## 2019-07-22 NOTE — Progress Notes (Signed)
Called to room to assist during endoscopic procedure.  Patient ID and intended procedure confirmed with present staff. Received instructions for my participation in the procedure from the performing physician.  

## 2019-07-22 NOTE — Patient Instructions (Signed)
HANDOUTS PROVIDED ON: GASTRITIS, POLYPS, DIVERTICULOSIS, & HEMORRHOIDS  The polyp removed/biopsies taken today have been sent for pathology.  The results can take 1-3 weeks to receive.  When your next colonoscopy should occur will be based on the pathology results.    You may resume your previous diet and medication schedule.  YOU MAY RESUME YOUR PLAVIX TOMORROW (07/23/2019).  Thank you for allowing [place] to care for you today!!!   YOU HAD AN ENDOSCOPIC PROCEDURE TODAY AT [facility]:   Refer to the procedure report that was given to you for any specific questions about what was found during the examination.  If the procedure report does not answer your questions, please call your gastroenterologist to clarify.  If you requested that your care partner not be given the details of your procedure findings, then the procedure report has been included in a sealed envelope for you to review at your convenience later.  YOU SHOULD EXPECT: Some feelings of bloating in the abdomen. Passage of more gas than usual.  Walking can help get rid of the air that was put into your GI tract during the procedure and reduce the bloating. If you had a lower endoscopy (such as a colonoscopy or flexible sigmoidoscopy) you may notice spotting of blood in your stool or on the toilet paper. If you underwent a bowel prep for your procedure, you may not have a normal bowel movement for a few days.  Please Note:  You might notice some irritation and congestion in your nose or some drainage.  This is from the oxygen used during your procedure.  There is no need for concern and it should clear up in a day or so.  SYMPTOMS TO REPORT IMMEDIATELY:   Following lower endoscopy (colonoscopy or flexible sigmoidoscopy):  Excessive amounts of blood in the stool  Significant tenderness or worsening of abdominal pains  Swelling of the abdomen that is new, acute  Fever of 100F or higher   Following upper endoscopy  (EGD)  Vomiting of blood or coffee ground material  New chest pain or pain under the shoulder blades  Painful or persistently difficult swallowing  New shortness of breath  Fever of 100F or higher  Black, tarry-looking stools  For urgent or emergent issues, a gastroenterologist can be reached at any hour by calling [phone]. Do not use MyChart messaging for urgent concerns.    DIET:  We do recommend a small meal at first, but then you may proceed to your regular diet.  Drink plenty of fluids but you should avoid alcoholic beverages for 24 hours.  ACTIVITY:  You should plan to take it easy for the rest of today and you should NOT DRIVE or use heavy machinery until tomorrow (because of the sedation medicines used during the test).    FOLLOW UP: Our staff will call the number listed on your records 48-72 hours following your procedure to check on you and address any questions or concerns that you may have regarding the information given to you following your procedure. If we do not reach you, we will leave a message.  We will attempt to reach you two times.  During this call, we will ask if you have developed any symptoms of COVID 19. If you develop any symptoms (ie: fever, flu-like symptoms, shortness of breath, cough etc.) before then, please call [phone].  If you test positive for Covid 19 in the 2 weeks post procedure, please call and report this information to  [place].    If any biopsies were taken you will be contacted by phone or by letter within the next 1-3 weeks.  Please call us at [phone] if you have not heard about the biopsies in 3 weeks.    SIGNATURES/CONFIDENTIALITY: You and/or your care partner have signed paperwork which will be entered into your electronic medical record.  These signatures attest to the fact that that the information above on your After Visit Summary has been reviewed and is understood.  Full responsibility of the confidentiality of this discharge  information lies with you and/or your care-partner.

## 2019-07-22 NOTE — Op Note (Signed)
[facility] Patient Name: Hannah Mcdaniel Procedure Date: 07/22/2019 7:58 AM MRN: [id number] Endoscopist: Milus Banister , MD Age: 54 Referring MD:  Date of Birth: [birth year] Gender: Female Account #: [id number] Procedure:                Colonoscopy Indications:              Screening for colorectal malignant neoplasm Medicines:                Monitored Anesthesia Care Procedure:                Pre-Anesthesia Assessment:                           - Prior to the procedure, a History and Physical                            was performed, and patient medications and                            allergies were reviewed. The patient's tolerance of                            previous anesthesia was also reviewed. The risks                            and benefits of the procedure and the sedation                            options and risks were discussed with the patient.                            All questions were answered, and informed consent                            was obtained. Prior Anticoagulants: The patient has                            taken no previous anticoagulant or antiplatelet                            agents. ASA Grade Assessment: III - A patient with                            severe systemic disease. After reviewing the risks                            and benefits, the patient was deemed in                            satisfactory condition to undergo the procedure.                           After obtaining informed consent, the colonoscope  was passed under direct vision. Throughout the                            procedure, the patient's blood pressure, pulse, and                            oxygen saturations were monitored continuously. The                            Colonoscope was introduced through the anus and                            advanced to the the cecum, identified by                            appendiceal orifice  and ileocecal valve. The                            colonoscopy was performed without difficulty. The                            patient tolerated the procedure well. The quality                            of the bowel preparation was excellent. Scope In: 8:01:05 AM Scope Out: 8:16:53 AM Scope Withdrawal Time: 0 hours 8 minutes 37 seconds  Total Procedure Duration: 0 hours 15 minutes 48 seconds  Findings:                 A 3 mm polyp was found in the transverse colon. The                            polyp was sessile. The polyp was removed with a                            cold snare. Resection and retrieval were complete.                           Multiple small and large-mouthed diverticula were                            found in the left colon associated with significant                            edema and luminal narrowing (sigmoid).                           Internal hemorrhoids with adjacent skin tags were                            found. The hemorrhoids were small.                           The exam was otherwise without abnormality on  direct and retroflexion views. Complications:            No immediate complications. Estimated blood loss:                            None. Estimated Blood Loss:     Estimated blood loss: none. Impression:               - One 3 mm polyp in the transverse colon, removed                            with a cold snare. Resected and retrieved.                           - Diverticulosis in the left colon.                           - Internal hemorrhoids.                           - The examination was otherwise normal on direct                            and retroflexion views. Recommendation:           - EGD now.                           - Await pathology results. Milus Banister, MD 07/22/2019 8:25:33 AM This report has been signed electronically.

## 2019-07-22 NOTE — Progress Notes (Signed)
Report to PACU, RN, vss, BBS= Clear.  

## 2019-07-22 NOTE — Op Note (Signed)
[facility] Patient Name: Hannah Mcdaniel Procedure Date: 07/22/2019 7:53 AM MRN: [id number] Endoscopist: Milus Banister , MD Age: 54 Referring MD:  Date of Birth: 11/29/1965 Gender: Female Account #: [id number] Procedure:                Upper GI endoscopy Indications:              Lower abdominal pain, Heartburn Medicines:                Monitored Anesthesia Care Procedure:                Pre-Anesthesia Assessment:                           - Prior to the procedure, a History and Physical                            was performed, and patient medications and                            allergies were reviewed. The patient's tolerance of                            previous anesthesia was also reviewed. The risks                            and benefits of the procedure and the sedation                            options and risks were discussed with the patient.                            All questions were answered, and informed consent                            was obtained. Prior Anticoagulants: The patient has                            taken Plavix (clopidogrel), last dose was 5 days                            prior to procedure. ASA Grade Assessment: III - A                            patient with severe systemic disease. After                            reviewing the risks and benefits, the patient was                            deemed in satisfactory condition to undergo the                            procedure.  After obtaining informed consent, the endoscope was                            passed under direct vision. Throughout the                            procedure, the patient's blood pressure, pulse, and                            oxygen saturations were monitored continuously. The                            Endoscope was introduced through the mouth, and                            advanced to the second part of duodenum. The upper                             GI endoscopy was accomplished without difficulty.                            The patient tolerated the procedure well. Scope In: Scope Out: Findings:                 Mild inflammation characterized by erythema was                            found in the gastric antrum. Biopsies were taken                            with a cold forceps for histology.                           The exam was otherwise without abnormality. Complications:            No immediate complications. Estimated blood loss:                            None. Estimated Blood Loss:     Estimated blood loss: none. Impression:               - Mild non-specific gastritis, biopsied.                           - The examination was otherwise normal. Recommendation:           - Patient has a contact number available for                            emergencies. The signs and symptoms of potential                            delayed complications were discussed with the                            patient. Return to normal activities tomorrow.  Written discharge instructions were provided to the                            patient.                           - Resume previous diet.                           - Continue present medications. OK to resume your                            plavix tomorrow.                           - Await pathology results. Milus Banister, MD 07/22/2019 8:27:56 AM This report has been signed electronically.

## 2019-07-22 NOTE — Progress Notes (Signed)
Vs cw  

## 2019-07-26 ENCOUNTER — Telehealth: Payer: Self-pay

## 2019-07-26 NOTE — Telephone Encounter (Signed)
°  Follow up Call-  Call back number 07/22/2019  Post procedure Call Back phone  # [phone]  Permission to leave phone message Yes  Some recent data might be hidden     Patient questions:  Do you have a fever, pain , or abdominal swelling? No. Pain Score  0 *  Have you tolerated food without any problems? Yes.    Have you been able to return to your normal activities? Yes.    Do you have any questions about your discharge instructions: Diet   No. Medications  No. Follow up visit  No.  Do you have questions or concerns about your Care? No.  Actions: * If pain score is 4 or above: 1. No action needed, pain <4.Have you developed a fever since your procedure? no  2.   Have you had an respiratory symptoms (SOB or cough) since your procedure? no  3.   Have you tested positive for COVID 19 since your procedure no  4.   Have you had any family members/close contacts diagnosed with the COVID 19 since your procedure?  no   If yes to any of these questions please route to Joylene John, RN and Erenest Rasher, RN

## 2019-07-27 ENCOUNTER — Encounter: Payer: Self-pay | Admitting: Gastroenterology

## 2019-08-07 ENCOUNTER — Other Ambulatory Visit: Payer: Self-pay | Admitting: Family Medicine

## 2019-08-07 DIAGNOSIS — J302 Other seasonal allergic rhinitis: Secondary | ICD-10-CM

## 2020-04-10 ENCOUNTER — Other Ambulatory Visit: Payer: Self-pay | Admitting: Family Medicine

## 2020-04-10 ENCOUNTER — Other Ambulatory Visit: Payer: Self-pay

## 2020-04-10 ENCOUNTER — Encounter: Payer: Self-pay | Admitting: Family Medicine

## 2020-04-10 ENCOUNTER — Ambulatory Visit (INDEPENDENT_AMBULATORY_CARE_PROVIDER_SITE_OTHER): Payer: 59 | Admitting: Family Medicine

## 2020-04-10 VITALS — BP 129/82 | HR 100 | Temp 98.0°F | Ht 61.0 in | Wt 153.6 lb

## 2020-04-10 DIAGNOSIS — F411 Generalized anxiety disorder: Secondary | ICD-10-CM | POA: Diagnosis not present

## 2020-04-10 DIAGNOSIS — Z0000 Encounter for general adult medical examination without abnormal findings: Secondary | ICD-10-CM

## 2020-04-10 DIAGNOSIS — E1169 Type 2 diabetes mellitus with other specified complication: Secondary | ICD-10-CM | POA: Diagnosis not present

## 2020-04-10 DIAGNOSIS — F331 Major depressive disorder, recurrent, moderate: Secondary | ICD-10-CM

## 2020-04-10 DIAGNOSIS — J302 Other seasonal allergic rhinitis: Secondary | ICD-10-CM

## 2020-04-10 DIAGNOSIS — E782 Mixed hyperlipidemia: Secondary | ICD-10-CM | POA: Diagnosis not present

## 2020-04-10 DIAGNOSIS — M79601 Pain in right arm: Secondary | ICD-10-CM

## 2020-04-10 DIAGNOSIS — E785 Hyperlipidemia, unspecified: Secondary | ICD-10-CM

## 2020-04-10 DIAGNOSIS — G43001 Migraine without aura, not intractable, with status migrainosus: Secondary | ICD-10-CM

## 2020-04-10 LAB — BAYER DCA HB A1C WAIVED: HB A1C (BAYER DCA - WAIVED): 7.1 % — ABNORMAL HIGH (ref <7.0)

## 2020-04-10 MED ORDER — MOMETASONE FUROATE 50 MCG/ACT NA SUSP: 2.0000 | Freq: Every day | NASAL | 12 refills | Status: DC

## 2020-04-10 MED ORDER — ROSUVASTATIN CALCIUM 40 MG PO TABS: 40.0000 mg | ORAL_TABLET | Freq: Every day | ORAL | 1 refills | Status: DC

## 2020-04-10 MED ORDER — BUPROPION HCL ER (XL) 150 MG PO TB24: 150.0000 mg | ORAL_TABLET | Freq: Every day | ORAL | 1 refills | Status: DC

## 2020-04-10 MED ORDER — MECLIZINE HCL 25 MG PO TABS: 25.0000 mg | ORAL_TABLET | Freq: Three times a day (TID) | ORAL | 5 refills | Status: DC | PRN

## 2020-04-10 MED ORDER — METFORMIN HCL 500 MG PO TABS: ORAL_TABLET | ORAL | 1 refills | Status: DC

## 2020-04-10 MED ORDER — SUMATRIPTAN SUCCINATE 100 MG PO TABS: 100.0000 mg | ORAL_TABLET | ORAL | 2 refills | Status: DC | PRN

## 2020-04-10 MED ORDER — BUPROPION HCL ER (XL) 300 MG PO TB24: 300.0000 mg | ORAL_TABLET | Freq: Every day | ORAL | 1 refills | Status: DC

## 2020-04-10 MED ORDER — FLUOXETINE HCL 20 MG PO CAPS: 20.0000 mg | ORAL_CAPSULE | Freq: Every day | ORAL | 2 refills | Status: DC

## 2020-04-10 MED ORDER — MONTELUKAST SODIUM 10 MG PO TABS: ORAL_TABLET | ORAL | 1 refills | Status: DC

## 2020-04-10 MED ORDER — LEVOCETIRIZINE DIHYDROCHLORIDE 5 MG PO TABS: 5.0000 mg | ORAL_TABLET | Freq: Every evening | ORAL | 2 refills | Status: DC

## 2020-04-10 NOTE — Progress Notes (Signed)
Assessment & Plan:  1. DM type 2 with diabetic dyslipidemia (HCC) Lab Results  Component Value Date   HGBA1C 7.1 (H) 04/10/2020   HGBA1C 8.4 (H) 06/29/2019    - Diabetes is not at goal of A1c < 7, but is improving. - Medications: increase metformin from 500 mg BID to 1,000 mg in the AM and 500 mg in the PM - Home glucose monitoring: continue monitoring - Patient is currently taking a statin. Patient is not taking an ACE-inhibitor/ARB.  - Instruction/counseling given: reminded to get eye exam and discussed foot care  Diabetes Health Maintenance Due  Topic Date Due  . OPHTHALMOLOGY EXAM  Never done  . URINE MICROALBUMIN  06/28/2020  . HEMOGLOBIN A1C  10/11/2020  . FOOT EXAM  04/10/2021    Lab Results  Component Value Date   LABMICR 7.4 06/29/2019   - Lipid panel - CMP14+EGFR - CBC with Differential/Platelet - Bayer DCA Hb A1c Waived - metFORMIN (GLUCOPHAGE) 500 MG tablet; Take 2 tablets (1,000 mg total) by mouth daily with breakfast AND 1 tablet (500 mg total) daily with supper.  Dispense: 270 tablet; Refill: 1 - rosuvastatin (CRESTOR) 40 MG tablet; Take 1 tablet (40 mg total) by mouth daily.  Dispense: 90 tablet; Refill: 1  2. Mixed hyperlipidemia Labs to assess. - Lipid panel - CMP14+EGFR - CBC with Differential/Platelet - rosuvastatin (CRESTOR) 40 MG tablet; Take 1 tablet (40 mg total) by mouth daily.  Dispense: 90 tablet; Refill: 1  3. Major depressive disorder, recurrent episode, moderate (HCC) Uncontrolled. Weaning off Celexa and starting Prozac. Previously failed therapy with Lexapro and Ativan. I am not willing to prescribe Xanax for chronic, long term management.  - CMP14+EGFR - buPROPion (WELLBUTRIN XL) 300 MG 24 hr tablet; Take 1 tablet (300 mg total) by mouth daily. Take in addition to 150 mg for a total of 450 mg daily.  Dispense: 90 tablet; Refill: 1 - buPROPion (WELLBUTRIN XL) 150 MG 24 hr tablet; Take 1 tablet (150 mg total) by mouth daily. Take in addition  to the 300 mg for a total of 450 mg daily.  Dispense: 90 tablet; Refill: 1 - FLUoxetine (PROZAC) 20 MG capsule; Take 1 capsule (20 mg total) by mouth daily.  Dispense: 30 capsule; Refill: 2  4. Anxiety, generalized Uncontrolled. Weaning off Celexa and starting Prozac. Previously failed therapy with Lexapro and Ativan. I am not willing to prescribe Xanax for chronic, long term management.  - CMP14+EGFR - buPROPion (WELLBUTRIN XL) 300 MG 24 hr tablet; Take 1 tablet (300 mg total) by mouth daily. Take in addition to 150 mg for a total of 450 mg daily.  Dispense: 90 tablet; Refill: 1 - buPROPion (WELLBUTRIN XL) 150 MG 24 hr tablet; Take 1 tablet (150 mg total) by mouth daily. Take in addition to the 300 mg for a total of 450 mg daily.  Dispense: 90 tablet; Refill: 1 - FLUoxetine (PROZAC) 20 MG capsule; Take 1 capsule (20 mg total) by mouth daily.  Dispense: 30 capsule; Refill: 2  5. Seasonal allergies Uncontrolled. Switching Flonase to Nasonex and Zyrtec to Xyzal to see if this helps better. Continue Singular. - montelukast (SINGULAIR) 10 MG tablet; TAKE 1 TABLET BY MOUTH EVERYDAY AT BEDTIME  Dispense: 90 tablet; Refill: 1 - mometasone (NASONEX) 50 MCG/ACT nasal spray; Place 2 sprays into the nose daily.  Dispense: 1 each; Refill: 12 - levocetirizine (XYZAL) 5 MG tablet; Take 1 tablet (5 mg total) by mouth every evening.  Dispense: 30 tablet; Refill:  2  6. Right arm pain - [place] MiscellaneoUS Localization; Future  7. Healthcare maintenance Patient is going to schedule her mammogram.   8. Migraine without aura and with status migrainosus, not intractable Well controlled on current regimen.  - SUMAtriptan (IMITREX) 100 MG tablet; Take 1 tablet (100 mg total) by mouth every 2 (two) hours as needed for migraine. May repeat in 2 hours if headache persists or recurs.  Dispense: 10 tablet; Refill: 2   Return in about 6 weeks (around 05/22/2020) for mood (may be telephone).  Hendricks Limes, MSN, APRN,  FNP-C [facility]  Subjective:    Patient ID: Hannah Mcdaniel, female    DOB: Jun 19, 1965, 55 y.o.   MRN: [id number]  Patient Care Team: [name], FNP as PCP - General (Family Medicine) Angelia Mould, MD as Consulting Physician (Vascular Surgery)   Chief Complaint:  Chief Complaint  Patient presents with  . Diabetes  . Depression    Check up of chronic medical conditions   . Arm Pain    Patient states she has been having right arm pain x 3 weeks.    HPI: Hannah Mcdaniel is a 55 y.o. female presenting on 04/10/2020 for Diabetes, Depression (Check up of chronic medical conditions/), and Arm Pain (Patient states she has been having right arm pain x 3 weeks.)  Diabetes: Patient presents for follow up of diabetes. Current symptoms include: hyperglycemia. Known diabetic complications: none. Medication compliance: yes. Current diet: in general, a "healthy" diet  . Current exercise: walking. Home blood sugar records: BGs are running  consistent with Hgb A1C. Is she  on ACE inhibitor or angiotensin II receptor blocker? No. Is she on a statin? Yes.   Depression/Anxiety: patient does not feel well controlled. She is currently maxed out on both Wellbutrin and Celexa. She does not feel either are really working for her. She has previously taken Ativan, Xanax, and Lexapro.   Depression screen [facility] 2/9 04/10/2020 06/29/2019 05/17/2019  Decreased Interest 3 3 1   Down, Depressed, Hopeless 3 3 3   PHQ - 2 Score 6 6 4   Altered sleeping 3 3 3   Tired, decreased energy 3 3 1   Change in appetite 2 3 3   Feeling bad or failure about yourself  3 0 3  Trouble concentrating 3 3 3   Moving slowly or fidgety/restless 2 3 3   Suicidal thoughts 1 0 1  PHQ-9 Score 23 21 21   Difficult doing work/chores Very difficult Somewhat difficult Very difficult   GAD 7 : Generalized Anxiety Score 04/10/2020 06/29/2019 05/17/2019  Nervous, Anxious, on Edge 3 3 3   Control/stop worrying 3 3 3    Worry too much - different things 2 2 3   Trouble relaxing 3 3 3   Restless 3 3 3   Easily annoyed or irritable 1 3 3   Afraid - awful might happen 3 0 3  Total GAD 7 Score 18 17 21   Anxiety Difficulty Very difficult Somewhat difficult Very difficult    Allergies: patient does not feel her allergies are controlled. She is using Flonase 2 sprays BID as well as taking Zyrtec and Singular.  New complaints: Patient c/o right arm pain x3 weeks. She does a lot of lifting of her mother-in-law who she is the primary caregiver for. She has tried ice, heat, Ibuprofen 800 mg TID, Biofreeze, Aspercreme, and Tylenol at home.   Social history:  Relevant past medical, surgical, family and social history reviewed and updated as indicated. Interim medical history since our  last visit reviewed.  Allergies and medications reviewed and updated.  DATA REVIEWED: CHART IN EPIC  ROS: Negative unless specifically indicated above in HPI.    Current Outpatient Medications:  .  aspirin EC 81 MG tablet, Take 81 mg by mouth daily., Disp: , Rfl:  .  buPROPion (WELLBUTRIN XL) 150 MG 24 hr tablet, Take 1 tablet (150 mg total) by mouth daily. Take in addition to the 300 mg for a total of 450 mg daily., Disp: 30 tablet, Rfl: 2 .  buPROPion (WELLBUTRIN XL) 300 MG 24 hr tablet, Take 1 tablet (300 mg total) by mouth daily. Take in addition to 150 mg for a total of 450 mg daily., Disp: 90 tablet, Rfl: 0 .  Choline Fenofibrate (FENOFIBRIC ACID) 135 MG CPDR, Take 135 mg by mouth at bedtime. , Disp: , Rfl:  .  citalopram (CELEXA) 40 MG tablet, Take 40 mg by mouth at bedtime., Disp: , Rfl: 1 .  clopidogrel (PLAVIX) 75 MG tablet, Take 1 tablet (75 mg total) by mouth daily., Disp: 90 tablet, Rfl: 2 .  meclizine (ANTIVERT) 25 MG tablet, Take 25 mg by mouth 3 (three) times daily as needed for dizziness., Disp: , Rfl:  .  metFORMIN (GLUCOPHAGE) 500 MG tablet, Take 1 tablet (500 mg total) by mouth 2 (two) times daily with a meal.,  Disp: 180 tablet, Rfl: 1 .  montelukast (SINGULAIR) 10 MG tablet, TAKE 1 TABLET BY MOUTH EVERYDAY AT BEDTIME, Disp: 90 tablet, Rfl: 1 .  rosuvastatin (CRESTOR) 40 MG tablet, Take 40 mg by mouth daily., Disp: , Rfl:  .  SUMAtriptan (IMITREX) 100 MG tablet, Take 1 tablet (100 mg total) by mouth every 2 (two) hours as needed for migraine. May repeat in 2 hours if headache persists or recurs., Disp: 10 tablet, Rfl: 2   Allergies  Allergen Reactions  . Asa [Aspirin] Rash    Can take low-dose aspirin.  . Codeine Rash  . Penicillins Rash    Has patient had a PCN reaction causing immediate rash, facial/tongue/throat swelling, SOB or lightheadedness with hypotension:Yes Has patient had a PCN reaction causing severe rash involving mucus membranes or skin necrosis:Yes Has patient had a PCN reaction that required hospitalization:No Has patient had a PCN reaction occurring within the last 10 years:No If all of the above answers are "NO", then may proceed with Cephalosporin use.    Past Medical History:  Diagnosis Date  . Anxiety   . Arterial occlusive disease Nov. 2014  . Arthritis   . Claudication of lower extremity [facility]) Nov. 2014   Right Lower Extremity rest pain  . Colon polyps   . Depression   . Diabetes mellitus without complication ([place])   . GERD (gastroesophageal reflux disease)   . Hyperlipidemia   . Migraines   . Vertigo     Past Surgical History:  Procedure Laterality Date  . ABDOMINAL AORTAGRAM N/A 01/10/2013   Procedure: ABDOMINAL AORTAGRAM;  Surgeon: Rosetta Posner, MD;  Location: [facility] CATH LAB;  Service: Cardiovascular;  Laterality: N/A;  . ABDOMINAL AORTOGRAM W/LOWER EXTREMITY Bilateral 07/16/2018   Procedure: ABDOMINAL AORTOGRAM W/LOWER EXTREMITY;  Surgeon: Angelia Mould, MD;  Location: [place] CV LAB;  Service: Cardiovascular;  Laterality: Bilateral;  . ABDOMINAL HYSTERECTOMY    . CHOLECYSTECTOMY     Gall Bladder  . iliac artery angioplasty and stent placement   01/10/13  . LOWER EXTREMITY ANGIOGRAM Bilateral 01/10/2013   Procedure: LOWER EXTREMITY ANGIOGRAM;  Surgeon: Rosetta Posner, MD;  Location:  [place] CATH LAB;  Service: Cardiovascular;  Laterality: Bilateral;  . PERCUTANEOUS STENT INTERVENTION Right 01/10/2013   Procedure: PERCUTANEOUS STENT INTERVENTION;  Surgeon: Rosetta Posner, MD;  Location: [facility] CATH LAB;  Service: Cardiovascular;  Laterality: Right;  rt common iliac stent  . PERIPHERAL VASCULAR CATHETERIZATION N/A 11/26/2015   Procedure: Abdominal Aortogram w/Lower Extremity;  Surgeon: Angelia Mould, MD;  Location: [place] CV LAB;  Service: Cardiovascular;  Laterality: N/A;  . PERIPHERAL VASCULAR CATHETERIZATION Right 11/26/2015   Procedure: Peripheral Vascular Balloon Angioplasty;  Surgeon: Angelia Mould, MD;  Location: [place] CV LAB;  Service: Cardiovascular;  Laterality: Right;  rt common iliac  . PERIPHERAL VASCULAR INTERVENTION  07/16/2018   Procedure: PERIPHERAL VASCULAR INTERVENTION;  Surgeon: Angelia Mould, MD;  Location: [place] CV LAB;  Service: Cardiovascular;;    Social History   Socioeconomic History  . Marital status: Married    Spouse name: Not on file  . Number of children: 3  . Years of education: Not on file  . Highest education level: Not on file  Occupational History  . Not on file  Tobacco Use  . Smoking status: Current Every Day Smoker    Packs/day: 0.25    Types: Cigarettes    Start date: 05/16/1977  . Smokeless tobacco: Never Used  . Tobacco comment: 1/2 cigarette per day  Vaping Use  . Vaping Use: Never used  Substance and Sexual Activity  . Alcohol use: Yes    Alcohol/week: 1.0 standard drink    Types: 1 Shots of liquor per week    Comment: occ  . Drug use: No  . Sexual activity: Yes    Partners: Male    Birth control/protection: Surgical  Other Topics Concern  . Not on file  Social History Narrative  . Not on file   Social Determinants of Health   Financial Resource  Strain: Not on file  Food Insecurity: Not on file  Transportation Needs: Not on file  Physical Activity: Not on file  Stress: Not on file  Social Connections: Not on file  Intimate Partner Violence: Not on file        Objective:    BP 129/82   Pulse 100   Temp 98 F (36.7 C) (Temporal)   Ht 5' 1"  (1.549 m)   Wt 153 lb 9.6 oz (69.7 kg)   SpO2 95%   BMI 29.02 kg/m   Wt Readings from Last 3 Encounters:  04/10/20 153 lb 9.6 oz (69.7 kg)  07/22/19 160 lb (72.6 kg)  06/29/19 160 lb 6.4 oz (72.8 kg)    Physical Exam Vitals reviewed.  Constitutional:      General: She is not in acute distress.    Appearance: Normal appearance. She is overweight. She is not ill-appearing, toxic-appearing or diaphoretic.  HENT:     Head: Normocephalic and atraumatic.  Eyes:     General: No scleral icterus.       Right eye: No discharge.        Left eye: No discharge.     Conjunctiva/sclera: Conjunctivae normal.  Cardiovascular:     Rate and Rhythm: Normal rate and regular rhythm.     Heart sounds: Normal heart sounds. No murmur heard. No friction rub. No gallop.   Pulmonary:     Effort: Pulmonary effort is normal. No respiratory distress.     Breath sounds: Normal breath sounds. No stridor. No wheezing, rhonchi or rales.  Musculoskeletal:  General: Normal range of motion.     Right upper arm: Swelling and tenderness present. No deformity, lacerations or bony tenderness.     Cervical back: Normal range of motion.  Skin:    General: Skin is warm and dry.     Capillary Refill: Capillary refill takes less than 2 seconds.  Neurological:     General: No focal deficit present.     Mental Status: She is alert and oriented to person, place, and time. Mental status is at baseline.  Psychiatric:        Mood and Affect: Mood normal.        Behavior: Behavior normal.        Thought Content: Thought content normal.        Judgment: Judgment normal.    Diabetic Foot Exam - Simple    Simple Foot Form Diabetic Foot exam was performed with the following findings: Yes 04/10/2020  3:30 PM  Visual Inspection No deformities, no ulcerations, no other skin breakdown bilaterally: Yes Sensation Testing Intact to touch and monofilament testing bilaterally: Yes Pulse Check Posterior Tibialis and Dorsalis pulse intact bilaterally: Yes Comments     No results found for: TSH Lab Results  Component Value Date   WBC 9.1 06/29/2019   HGB 14.8 06/29/2019   HCT 42.0 06/29/2019   MCV 91 06/29/2019   PLT 287 06/29/2019   Lab Results  Component Value Date   NA 140 06/29/2019   K 4.0 06/29/2019   CO2 22 06/29/2019   GLUCOSE 208 (H) 06/29/2019   BUN 13 06/29/2019   CREATININE 0.71 06/29/2019   BILITOT 0.3 06/29/2019   ALKPHOS 125 (H) 06/29/2019   AST 21 06/29/2019   ALT 19 06/29/2019   PROT 7.0 06/29/2019   ALBUMIN 4.4 06/29/2019   CALCIUM 9.6 06/29/2019   Lab Results  Component Value Date   CHOL 211 (H) 06/29/2019   Lab Results  Component Value Date   HDL 41 06/29/2019   Lab Results  Component Value Date   LDLCALC 110 (H) 06/29/2019   Lab Results  Component Value Date   TRIG 348 (H) 06/29/2019   Lab Results  Component Value Date   CHOLHDL 5.1 (H) 06/29/2019   Lab Results  Component Value Date   HGBA1C 8.4 (H) 06/29/2019

## 2020-04-10 NOTE — Patient Instructions (Signed)
Decrease Celexa to 20 mg once daily x1 week, then stop. Go ahead and start Prozac.

## 2020-04-11 DIAGNOSIS — J302 Other seasonal allergic rhinitis: Secondary | ICD-10-CM | POA: Insufficient documentation

## 2020-04-11 LAB — CMP14+EGFR
ALT: 12 IU/L (ref 0–32)
AST: 14 IU/L (ref 0–40)
Albumin/Globulin Ratio: 1.9 (ref 1.2–2.2)
Albumin: 4.5 g/dL (ref 3.8–4.9)
Alkaline Phosphatase: 143 IU/L — ABNORMAL HIGH (ref 44–121)
BUN/Creatinine Ratio: 14 (ref 9–23)
BUN: 10 mg/dL (ref 6–24)
Bilirubin Total: 0.4 mg/dL (ref 0.0–1.2)
CO2: 21 mmol/L (ref 20–29)
Calcium: 9.6 mg/dL (ref 8.7–10.2)
Chloride: 102 mmol/L (ref 96–106)
Creatinine, Ser: 0.71 mg/dL (ref 0.57–1.00)
Globulin, Total: 2.4 g/dL (ref 1.5–4.5)
Glucose: 122 mg/dL — ABNORMAL HIGH (ref 65–99)
Potassium: 4.1 mmol/L (ref 3.5–5.2)
Sodium: 140 mmol/L (ref 134–144)
Total Protein: 6.9 g/dL (ref 6.0–8.5)
eGFR: 100 mL/min/{1.73_m2} (ref >59)

## 2020-04-11 LAB — CBC WITH DIFFERENTIAL/PLATELET
Basophils Absolute: 0.1 10*3/uL (ref 0.0–0.2)
Basos: 1 %
EOS (ABSOLUTE): 0.1 10*3/uL (ref 0.0–0.4)
Eos: 1 %
Hematocrit: 44.4 % (ref 34.0–46.6)
Hemoglobin: 15.3 g/dL (ref 11.1–15.9)
Immature Grans (Abs): 0 10*3/uL (ref 0.0–0.1)
Immature Granulocytes: 0 %
Lymphocytes Absolute: 4 10*3/uL — ABNORMAL HIGH (ref 0.7–3.1)
Lymphs: 34 %
MCH: 30.4 pg (ref 26.6–33.0)
MCHC: 34.5 g/dL (ref 31.5–35.7)
MCV: 88 fL (ref 79–97)
Monocytes Absolute: 0.5 10*3/uL (ref 0.1–0.9)
Monocytes: 4 %
Neutrophils Absolute: 7.1 10*3/uL — ABNORMAL HIGH (ref 1.4–7.0)
Neutrophils: 60 %
Platelets: 298 10*3/uL (ref 150–450)
RBC: 5.04 x10E6/uL (ref 3.77–5.28)
RDW: 11.7 % (ref 11.7–15.4)
WBC: 11.8 10*3/uL — ABNORMAL HIGH (ref 3.4–10.8)

## 2020-04-11 LAB — LIPID PANEL
Chol/HDL Ratio: 7.5 ratio — ABNORMAL HIGH (ref 0.0–4.4)
Cholesterol, Total: 294 mg/dL — ABNORMAL HIGH (ref 100–199)
HDL: 39 mg/dL — ABNORMAL LOW (ref >39)
LDL Chol Calc (NIH): 178 mg/dL — ABNORMAL HIGH (ref 0–99)
Triglycerides: 390 mg/dL — ABNORMAL HIGH (ref 0–149)
VLDL Cholesterol Cal: 77 mg/dL — ABNORMAL HIGH (ref 5–40)

## 2020-04-11 NOTE — Telephone Encounter (Signed)
Can we please try PA.

## 2020-04-11 NOTE — Telephone Encounter (Signed)
Pharmacy comment:  Alternative Requested:PRIOR AUTHORIZATION OR ALTERNATIVE NEEDED.

## 2020-04-13 ENCOUNTER — Other Ambulatory Visit: Payer: Self-pay | Admitting: Family Medicine

## 2020-04-13 DIAGNOSIS — M79601 Pain in right arm: Secondary | ICD-10-CM

## 2020-04-14 LAB — HM DIABETES EYE EXAM

## 2020-04-15 ENCOUNTER — Other Ambulatory Visit: Payer: Self-pay | Admitting: Family Medicine

## 2020-04-15 DIAGNOSIS — E782 Mixed hyperlipidemia: Secondary | ICD-10-CM

## 2020-04-15 MED ORDER — EZETIMIBE 10 MG PO TABS: 10.0000 mg | ORAL_TABLET | Freq: Every day | ORAL | 2 refills | Status: DC

## 2020-04-18 ENCOUNTER — Ambulatory Visit (HOSPITAL_COMMUNITY)
Admission: RE | Admit: 2020-04-18 | Discharge: 2020-04-18 | Disposition: A | Discharge status: Home / Self Care | Payer: 59 | Source: Ambulatory Visit | Attending: Family Medicine | Admitting: Family Medicine

## 2020-04-18 ENCOUNTER — Other Ambulatory Visit: Payer: Self-pay

## 2020-04-18 DIAGNOSIS — M79601 Pain in right arm: Secondary | ICD-10-CM | POA: Diagnosis present

## 2020-04-25 NOTE — Telephone Encounter (Signed)
PA in process/ww  Key: [id number] - PA Case ID: [id number]

## 2020-04-30 NOTE — Telephone Encounter (Signed)
Form came over fax - asking for more info - questions answered and faxed back.

## 2020-05-03 ENCOUNTER — Other Ambulatory Visit: Payer: Self-pay | Admitting: Family Medicine

## 2020-05-03 DIAGNOSIS — F411 Generalized anxiety disorder: Secondary | ICD-10-CM

## 2020-05-03 DIAGNOSIS — F331 Major depressive disorder, recurrent, moderate: Secondary | ICD-10-CM

## 2020-05-03 MED ORDER — FLUNISOLIDE 25 MCG/ACT (0.025%) NA SOLN: 2.0000 | Freq: Two times a day (BID) | NASAL | 5 refills | Status: DC

## 2020-05-03 NOTE — Telephone Encounter (Signed)
I sent a new prescription per their step therapy.

## 2020-05-03 NOTE — Addendum Note (Signed)
Addended by: Hendricks Limes F on: 05/03/2020 01:08 PM   Modules accepted: Orders

## 2020-05-03 NOTE — Telephone Encounter (Signed)
Denied on March 30 This request has not been approved. Based on the information submitted for review, you did not meet our step therapy rule for the requested drug. Our guideline named STANDARD STEP THERAPY requires that you have tried preferred options before receiving coverage for this drug. In order for your request to be approved, your provider needs to tell us that you have tried the step therapies listed below. Your provider may give a reason why you cannot take our suggested step therapies, including a statement that these therapies would not work as well or could cause side effects. In some cases, the requested medication or alternatives offered may have additional approval requirements. Approval requires you to try: TWO of the following: flunisolide, fluticasone, or budesonideYour doctor told us that you have seasonal allergic rhinitis (seasonal allergies: allergic response causing itchy, watery eyes, sneezing, and other similar symptoms). A trial of fluticasone is noted. However, your doctor indicated that you have not tried or have a contraindication (medical reason why you cannot use) to flunisolide or budesonide. This is why your request is denied. Please work with your doctor to use a different medication. A written notification letter will follow with additional details

## 2020-05-16 ENCOUNTER — Ambulatory Visit (INDEPENDENT_AMBULATORY_CARE_PROVIDER_SITE_OTHER): Payer: 59 | Admitting: Family Medicine

## 2020-05-16 ENCOUNTER — Encounter: Payer: Self-pay | Admitting: Family Medicine

## 2020-05-16 DIAGNOSIS — F411 Generalized anxiety disorder: Secondary | ICD-10-CM

## 2020-05-16 DIAGNOSIS — F331 Major depressive disorder, recurrent, moderate: Secondary | ICD-10-CM | POA: Diagnosis not present

## 2020-05-16 MED ORDER — FLUOXETINE HCL 40 MG PO CAPS: 40.0000 mg | ORAL_CAPSULE | Freq: Every day | ORAL | 1 refills | Status: DC

## 2020-05-16 NOTE — Progress Notes (Signed)
Virtual Visit via Telephone Note  I connected with Hannah Mcdaniel on 05/16/20 at 9:18 AM by telephone and verified that I am speaking with the correct person using two identifiers. Hannah Mcdaniel is currently located at the hospital and her mother-in-law is currently with her during this visit. The provider, [name], FNP is located in their office at time of visit.  I discussed the limitations, risks, security and privacy concerns of performing an evaluation and management service by telephone and the availability of in person appointments. I also discussed with the patient that there may be a patient responsible charge related to this service. The patient expressed understanding and agreed to proceed.  Subjective: PCP: [name], FNP  Chief Complaint  Patient presents with  . Depression  . Anxiety   At patient's last visit she was weaned off of Celexa and started on Prozac 20 mg daily.  She feels her anxiety and depression is doing a little better.  Depression screen [facility] 2/9 05/16/2020 04/10/2020 06/29/2019  Decreased Interest 2 3 3   Down, Depressed, Hopeless 3 3 3   PHQ - 2 Score 5 6 6   Altered sleeping 3 3 3   Tired, decreased energy 1 3 3   Change in appetite 0 2 3  Feeling bad or failure about yourself  1 3 0  Trouble concentrating 0 3 3  Moving slowly or fidgety/restless 0 2 3  Suicidal thoughts 0 1 0  PHQ-9 Score 10 23 21   Difficult doing work/chores Somewhat difficult Very difficult Somewhat difficult   GAD 7 : Generalized Anxiety Score 05/16/2020 04/10/2020 06/29/2019 05/17/2019  Nervous, Anxious, on Edge 1 3 3 3   Control/stop worrying 3 3 3 3   Worry too much - different things 3 2 2 3   Trouble relaxing 1 3 3 3   Restless 1 3 3 3   Easily annoyed or irritable 3 1 3 3   Afraid - awful might happen 1 3 0 3  Total GAD 7 Score 13 18 17 21   Anxiety Difficulty Somewhat difficult Very difficult Somewhat difficult Very difficult    ROS: Per HPI  Current Outpatient  Medications:  .  aspirin EC 81 MG tablet, Take 81 mg by mouth daily., Disp: , Rfl:  .  buPROPion (WELLBUTRIN XL) 150 MG 24 hr tablet, Take 1 tablet (150 mg total) by mouth daily. Take in addition to the 300 mg for a total of 450 mg daily., Disp: 90 tablet, Rfl: 1 .  buPROPion (WELLBUTRIN XL) 300 MG 24 hr tablet, Take 1 tablet (300 mg total) by mouth daily. Take in addition to 150 mg for a total of 450 mg daily., Disp: 90 tablet, Rfl: 1 .  Choline Fenofibrate (FENOFIBRIC ACID) 135 MG CPDR, Take 135 mg by mouth at bedtime. , Disp: , Rfl:  .  clopidogrel (PLAVIX) 75 MG tablet, Take 1 tablet (75 mg total) by mouth daily., Disp: 90 tablet, Rfl: 2 .  ezetimibe (ZETIA) 10 MG tablet, Take 1 tablet (10 mg total) by mouth daily., Disp: 30 tablet, Rfl: 2 .  flunisolide (NASALIDE) 25 MCG/ACT (0.025%) SOLN, Place 2 sprays into the nose 2 (two) times daily., Disp: 25 mL, Rfl: 5 .  FLUoxetine (PROZAC) 20 MG capsule, TAKE 1 CAPSULE BY MOUTH EVERY DAY, Disp: 90 capsule, Rfl: 0 .  levocetirizine (XYZAL) 5 MG tablet, Take 1 tablet (5 mg total) by mouth every evening., Disp: 30 tablet, Rfl: 2 .  meclizine (ANTIVERT) 25 MG tablet, Take 1 tablet (25 mg  total) by mouth 3 (three) times daily as needed for dizziness., Disp: 30 tablet, Rfl: 5 .  metFORMIN (GLUCOPHAGE) 500 MG tablet, Take 2 tablets (1,000 mg total) by mouth daily with breakfast AND 1 tablet (500 mg total) daily with supper., Disp: 270 tablet, Rfl: 1 .  montelukast (SINGULAIR) 10 MG tablet, TAKE 1 TABLET BY MOUTH EVERYDAY AT BEDTIME, Disp: 90 tablet, Rfl: 1 .  rosuvastatin (CRESTOR) 40 MG tablet, Take 1 tablet (40 mg total) by mouth daily., Disp: 90 tablet, Rfl: 1 .  SUMAtriptan (IMITREX) 100 MG tablet, Take 1 tablet (100 mg total) by mouth every 2 (two) hours as needed for migraine. May repeat in 2 hours if headache persists or recurs., Disp: 10 tablet, Rfl: 2  Allergies  Allergen Reactions  . Asa [Aspirin] Rash    Can take low-dose aspirin.  . Codeine  Rash  . Penicillins Rash    Has patient had a PCN reaction causing immediate rash, facial/tongue/throat swelling, SOB or lightheadedness with hypotension:Yes Has patient had a PCN reaction causing severe rash involving mucus membranes or skin necrosis:Yes Has patient had a PCN reaction that required hospitalization:No Has patient had a PCN reaction occurring within the last 10 years:No If all of the above answers are "NO", then may proceed with Cephalosporin use.    Past Medical History:  Diagnosis Date  . Anxiety   . Arterial occlusive disease Nov. 2014  . Arthritis   . Claudication of lower extremity [facility]) Nov. 2014   Right Lower Extremity rest pain  . Colon polyps   . Depression   . Diabetes mellitus without complication ([place])   . GERD (gastroesophageal reflux disease)   . Hyperlipidemia   . Migraines   . Vertigo     Observations/Objective: A&O  No respiratory distress or wheezing audible over the phone Mood, judgement, and thought processes all WNL   Assessment and Plan: 1. Major depressive disorder, recurrent episode, moderate (HCC) Improving.  Prozac increased from 20 mg to 40 mg once daily.  Continue Wellbutrin 150 mg once daily. - FLUoxetine (PROZAC) 40 MG capsule; Take 1 capsule (40 mg total) by mouth daily.  Dispense: 90 capsule; Refill: 1  2. Anxiety, generalized Improving.  Prozac increased from 20 mg to 40 mg once daily.  - FLUoxetine (PROZAC) 40 MG capsule; Take 1 capsule (40 mg total) by mouth daily.  Dispense: 90 capsule; Refill: 1   Follow Up Instructions: Return in about 2 months (around 07/16/2020) for follow-up of chronic medication conditions.  I discussed the assessment and treatment plan with the patient. The patient was provided an opportunity to ask questions and all were answered. The patient agreed with the plan and demonstrated an understanding of the instructions.   The patient was advised to call back or seek an in-person evaluation if the  symptoms worsen or if the condition fails to improve as anticipated.  The above assessment and management plan was discussed with the patient. The patient verbalized understanding of and has agreed to the management plan. Patient is aware to call the clinic if symptoms persist or worsen. Patient is aware when to return to the clinic for a follow-up visit. Patient educated on when it is appropriate to go to the emergency department.   Time call ended: 9:30 Am  I provided 12 minutes of non-face-to-face time during this encounter.  Hendricks Limes, MSN, APRN, FNP-C [facility] 05/16/20

## 2020-06-01 ENCOUNTER — Ambulatory Visit (INDEPENDENT_AMBULATORY_CARE_PROVIDER_SITE_OTHER)
Admission: RE | Admit: 2020-06-01 | Discharge: 2020-06-01 | Disposition: A | Discharge status: Home / Self Care | Payer: 59 | Source: Ambulatory Visit | Attending: Physician Assistant | Admitting: Physician Assistant

## 2020-06-01 ENCOUNTER — Ambulatory Visit (HOSPITAL_COMMUNITY)
Admission: RE | Admit: 2020-06-01 | Discharge: 2020-06-01 | Disposition: A | Discharge status: Home / Self Care | Payer: 59 | Source: Ambulatory Visit | Attending: Physician Assistant | Admitting: Physician Assistant

## 2020-06-01 ENCOUNTER — Ambulatory Visit: Payer: 59 | Admitting: Physician Assistant

## 2020-06-01 ENCOUNTER — Other Ambulatory Visit: Payer: Self-pay

## 2020-06-01 ENCOUNTER — Encounter: Payer: Self-pay | Admitting: Physician Assistant

## 2020-06-01 DIAGNOSIS — I739 Peripheral vascular disease, unspecified: Secondary | ICD-10-CM | POA: Insufficient documentation

## 2020-06-01 DIAGNOSIS — Z95828 Presence of other vascular implants and grafts: Secondary | ICD-10-CM | POA: Diagnosis present

## 2020-06-01 MED ORDER — CLOPIDOGREL BISULFATE 75 MG PO TABS: 75.0000 mg | ORAL_TABLET | Freq: Every day | ORAL | 11 refills | Status: DC

## 2020-06-01 NOTE — Progress Notes (Signed)
History of Present Illness:  Patient is a 55 y.o. year old female who presents for evaluation of PAD with a history of claudication.  Surgical history significant for right common iliac artery stenting in 2014.  She has experienced recurrent stenosis in this area over the years most recently requiring stenting of right common iliac artery with a VBX stent graft and stenting of external iliac artery with balloon expandable stent by Dr. Scot Dock on 07/16/2018.    She is taking her aspirin, Plavix, and statin regimen daily.  She states she is having increased right groin pain and she has had this pain before her intervention.  She denise increased claudication , non healing wounds, and rest pain.   Past Medical History:  Diagnosis Date  . Anxiety   . Arterial occlusive disease Nov. 2014  . Arthritis   . Claudication of lower extremity [facility]) Nov. 2014   Right Lower Extremity rest pain  . Colon polyps   . Depression   . Diabetes mellitus without complication ([place])   . GERD (gastroesophageal reflux disease)   . Hyperlipidemia   . Migraines   . Vertigo     Past Surgical History:  Procedure Laterality Date  . ABDOMINAL AORTAGRAM N/A 01/10/2013   Procedure: ABDOMINAL AORTAGRAM;  Surgeon: Rosetta Posner, MD;  Location: [facility] CATH LAB;  Service: Cardiovascular;  Laterality: N/A;  . ABDOMINAL AORTOGRAM W/LOWER EXTREMITY Bilateral 07/16/2018   Procedure: ABDOMINAL AORTOGRAM W/LOWER EXTREMITY;  Surgeon: Angelia Mould, MD;  Location: [place] CV LAB;  Service: Cardiovascular;  Laterality: Bilateral;  . ABDOMINAL HYSTERECTOMY    . CHOLECYSTECTOMY     Gall Bladder  . iliac artery angioplasty and stent placement  01/10/13  . LOWER EXTREMITY ANGIOGRAM Bilateral 01/10/2013   Procedure: LOWER EXTREMITY ANGIOGRAM;  Surgeon: Rosetta Posner, MD;  Location: [facility] CATH LAB;  Service: Cardiovascular;  Laterality: Bilateral;  . PERCUTANEOUS STENT INTERVENTION Right 01/10/2013   Procedure: PERCUTANEOUS STENT  INTERVENTION;  Surgeon: Rosetta Posner, MD;  Location: [facility] CATH LAB;  Service: Cardiovascular;  Laterality: Right;  rt common iliac stent  . PERIPHERAL VASCULAR CATHETERIZATION N/A 11/26/2015   Procedure: Abdominal Aortogram w/Lower Extremity;  Surgeon: Angelia Mould, MD;  Location: [place] CV LAB;  Service: Cardiovascular;  Laterality: N/A;  . PERIPHERAL VASCULAR CATHETERIZATION Right 11/26/2015   Procedure: Peripheral Vascular Balloon Angioplasty;  Surgeon: Angelia Mould, MD;  Location: [place] CV LAB;  Service: Cardiovascular;  Laterality: Right;  rt common iliac  . PERIPHERAL VASCULAR INTERVENTION  07/16/2018   Procedure: PERIPHERAL VASCULAR INTERVENTION;  Surgeon: Angelia Mould, MD;  Location: [place] CV LAB;  Service: Cardiovascular;;    ROS:   General:  No weight loss, Fever, chills  HEENT: No recent headaches, no nasal bleeding, no visual changes, no sore throat  Neurologic: No dizziness, blackouts, seizures. No recent symptoms of stroke or mini- stroke. No recent episodes of slurred speech, or temporary blindness.  Cardiac: No recent episodes of chest pain/pressure, no shortness of breath at rest.  No shortness of breath with exertion.  Denies history of atrial fibrillation or irregular heartbeat  Vascular: No history of rest pain in feet.  No history of claudication.  No history of non-healing ulcer, No history of DVT   Pulmonary: No home oxygen, no productive cough, no hemoptysis,  No asthma or wheezing  Musculoskeletal:  [ ]  Arthritis, [ ]  Low back pain,  [ ]  Joint pain  Hematologic:No history of hypercoagulable state.  No history of easy bleeding.  No history of anemia  Gastrointestinal: No hematochezia or melena,  No gastroesophageal reflux, no trouble swallowing  Urinary: [ ]  chronic Kidney disease, [ ]  on HD - [ ]  MWF or [ ]  TTHS, [ ]  Burning with urination, [ ]  Frequent urination, [ ]  Difficulty urinating;   Skin: No  rashes  Psychological: No history of anxiety,  No history of depression  Social History Social History   Tobacco Use  . Smoking status: Current Every Day Smoker    Packs/day: 0.25    Types: Cigarettes    Start date: 05/16/1977  . Smokeless tobacco: Never Used  . Tobacco comment: 1/2 cigarette per day  Vaping Use  . Vaping Use: Never used  Substance Use Topics  . Alcohol use: Yes    Alcohol/week: 1.0 standard drink    Types: 1 Shots of liquor per week    Comment: occ  . Drug use: No    Family History Family History  Problem Relation Age of Onset  . Diabetes Mother   . Hyperlipidemia Mother   . Hypertension Mother   . Varicose Veins Mother   . COPD Mother   . Stroke Mother   . Anxiety disorder Mother   . Depression Mother   . Ovarian cancer Mother   . Acute myelogenous leukemia Father   . Diabetes Brother   . Hypertension Brother   . Hyperlipidemia Brother   . Stroke Maternal Grandmother   . Anxiety disorder Maternal Grandmother   . Depression Maternal Grandmother   . Breast cancer Maternal Grandmother   . Diabetes Maternal Grandmother   . Heart disease Maternal Grandfather   . Lung cancer Paternal Grandfather   . Other Brother        Blood disease  . Anxiety disorder Daughter   . Migraines Daughter   . Migraines Daughter   . Migraines Son   . Seizures Son     Allergies  Allergies  Allergen Reactions  . Asa [Aspirin] Rash    Can take low-dose aspirin.  . Codeine Rash  . Penicillins Rash    Has patient had a PCN reaction causing immediate rash, facial/tongue/throat swelling, SOB or lightheadedness with hypotension:Yes Has patient had a PCN reaction causing severe rash involving mucus membranes or skin necrosis:Yes Has patient had a PCN reaction that required hospitalization:No Has patient had a PCN reaction occurring within the last 10 years:No If all of the above answers are "NO", then may proceed with Cephalosporin use.      Current Outpatient  Medications  Medication Sig Dispense Refill  . aspirin EC 81 MG tablet Take 81 mg by mouth daily.    Marland Kitchen buPROPion (WELLBUTRIN XL) 150 MG 24 hr tablet Take 1 tablet (150 mg total) by mouth daily. Take in addition to the 300 mg for a total of 450 mg daily. 90 tablet 1  . buPROPion (WELLBUTRIN XL) 300 MG 24 hr tablet Take 1 tablet (300 mg total) by mouth daily. Take in addition to 150 mg for a total of 450 mg daily. 90 tablet 1  . Choline Fenofibrate (FENOFIBRIC ACID) 135 MG CPDR Take 135 mg by mouth at bedtime.     . clopidogrel (PLAVIX) 75 MG tablet Take 1 tablet (75 mg total) by mouth daily. 90 tablet 2  . ezetimibe (ZETIA) 10 MG tablet Take 1 tablet (10 mg total) by mouth daily. 30 tablet 2  . flunisolide (NASALIDE) 25 MCG/ACT (0.025%) SOLN Place 2 sprays into the  nose 2 (two) times daily. 25 mL 5  . FLUoxetine (PROZAC) 40 MG capsule Take 1 capsule (40 mg total) by mouth daily. 90 capsule 1  . levocetirizine (XYZAL) 5 MG tablet Take 1 tablet (5 mg total) by mouth every evening. 30 tablet 2  . meclizine (ANTIVERT) 25 MG tablet Take 1 tablet (25 mg total) by mouth 3 (three) times daily as needed for dizziness. 30 tablet 5  . metFORMIN (GLUCOPHAGE) 500 MG tablet Take 2 tablets (1,000 mg total) by mouth daily with breakfast AND 1 tablet (500 mg total) daily with supper. 270 tablet 1  . montelukast (SINGULAIR) 10 MG tablet TAKE 1 TABLET BY MOUTH EVERYDAY AT BEDTIME 90 tablet 1  . rosuvastatin (CRESTOR) 40 MG tablet Take 1 tablet (40 mg total) by mouth daily. 90 tablet 1  . SUMAtriptan (IMITREX) 100 MG tablet Take 1 tablet (100 mg total) by mouth every 2 (two) hours as needed for migraine. May repeat in 2 hours if headache persists or recurs. 10 tablet 2   No current facility-administered medications for this visit.    Physical Examination  Vitals:   06/01/20 0828  BP: 117/71  Pulse: 79  Resp: 20  Temp: 98.4 F (36.9 C)  TempSrc: Temporal  SpO2: 99%  Weight: 153 lb 4.8 oz (69.5 kg)  Height:  5\' 1"  (1.549 m)    Body mass index is 28.97 kg/m.  General:  Alert and oriented, no acute distress HEENT: Normal Neck: No bruit or JVD Pulmonary: Clear to auscultation bilaterally Cardiac: Regular Rate and Rhythm without murmur Abdomen: Soft, non-tender, non-distended, no mass, no scars Skin: No rash Extremity Pulses:  2+ radial, right non palpable dorsalis pedis, posterior tibial pulses Musculoskeletal: No deformity or edema  Neurologic: Upper and lower extremity motor 5/5 and symmetric  DATA:  ABI Findings:  +---------+------------------+-----+----------+--------+  Right  Rt Pressure (mmHg)IndexWaveform Comment   +---------+------------------+-----+----------+--------+  Brachial 142                      +---------+------------------+-----+----------+--------+  ATA   86        0.61 monophasic      +---------+------------------+-----+----------+--------+  PTA   126        0.89 monophasic      +---------+------------------+-----+----------+--------+  Great Toe56        0.39 Abnormal       +---------+------------------+-----+----------+--------+   +---------+------------------+-----+--------+-------+  Left   Lt Pressure (mmHg)IndexWaveformComment  +---------+------------------+-----+--------+-------+  Brachial 141                     +---------+------------------+-----+--------+-------+  ATA   136        0.96 biphasic      +---------+------------------+-----+--------+-------+  PTA   143        1.01 biphasic      +---------+------------------+-----+--------+-------+  Great Toe92        0.65 Abnormal      +---------+------------------+-----+--------+-------+   +-------+-----------+-----------+------------+------------+  ABI/TBIToday's ABIToday's TBIPrevious ABIPrevious TBI   +-------+-----------+-----------+------------+------------+  Right 0.89    0.39    0.95    0.59      +-------+-----------+-----------+------------+------------+  Left  1.01    0.65    1.15    0.81      +-------+-----------+-----------+------------+------------+     Summary:  Right: Resting right ankle-brachial index indicates mild right lower  extremity arterial disease. The right toe-brachial index is abnormal.   Left: Resting left ankle-brachial index is within normal range. No  evidence of significant left  lower extremity arterial disease. The left  toe-brachial index is abnormal.      Abdominal Aorta Findings:  +-------------+-------+----------+----------+--------+--------+----------+  Location   AP (cm)Trans (cm)PSV (cm/s)WaveformThrombusComments   +-------------+-------+----------+----------+--------+--------+----------+  Proximal   1.67       83                   +-------------+-------+----------+----------+--------+--------+----------+  Mid     1.85       114                   +-------------+-------+----------+----------+--------+--------+----------+  Distal    1.96       81                   +-------------+-------+----------+----------+--------+--------+----------+  RT CIA Prox          151                   +-------------+-------+----------+----------+--------+--------+----------+  RT CIA Mid           247                   +-------------+-------+----------+----------+--------+--------+----------+  RT CIA Distal         275                   +-------------+-------+----------+----------+--------+--------+----------+  RT EIA Prox          147                    +-------------+-------+----------+----------+--------+--------+----------+  RT EIA Mid           558                   +-------------+-------+----------+----------+--------+--------+----------+  RT EIA Distal         70            monophasic  +-------------+-------+----------+----------+--------+--------+----------+   Right CIA/EIA stent not clearly visualized but stenotic velocities are  obtained in the area of the stent.     Summary:  Stenosis: +--------------------+---------------+  Location      Stent       +--------------------+---------------+  Right Common Iliac 50-99% stenosis  +--------------------+---------------+  Right External Iliac50-99% stenosis  +--------------------+---------------+    ASSESSMENT: PAD with history of right common and external iliac artery stenting and re stenosis requiring angioplasty 10/21/2017 by Dr. Scot Dock.  On her duplex today she shows evidence of re stenosis without increased symptoms of tissue loss, rest pain or increased claudication symptoms.  Her ABI's are essentially  Unchanged.    PLAN: I will have her f/u in 3 months with DR. Scot Dock and repeat her ABI/iliac duplex.  She may require repeat angiogram with intervention.  She will cont. ASA and Plavix daily.     Roxy Horseman PA-C [facility] Office: [phone]  I discussed my findings with Dr. Scot Dock

## 2020-06-06 ENCOUNTER — Other Ambulatory Visit: Payer: Self-pay

## 2020-06-06 DIAGNOSIS — I739 Peripheral vascular disease, unspecified: Secondary | ICD-10-CM

## 2020-06-23 ENCOUNTER — Other Ambulatory Visit: Payer: Self-pay | Admitting: Family Medicine

## 2020-06-23 DIAGNOSIS — J302 Other seasonal allergic rhinitis: Secondary | ICD-10-CM

## 2020-06-23 DIAGNOSIS — E782 Mixed hyperlipidemia: Secondary | ICD-10-CM

## 2020-06-25 ENCOUNTER — Encounter: Payer: Self-pay | Admitting: Family Medicine

## 2020-07-04 ENCOUNTER — Other Ambulatory Visit: Payer: Self-pay

## 2020-07-04 ENCOUNTER — Ambulatory Visit (INDEPENDENT_AMBULATORY_CARE_PROVIDER_SITE_OTHER): Payer: 59 | Admitting: Family Medicine

## 2020-07-04 ENCOUNTER — Encounter: Payer: Self-pay | Admitting: Family Medicine

## 2020-07-04 VITALS — BP 115/61 | HR 75 | Temp 97.4°F | Ht 61.0 in | Wt 155.6 lb

## 2020-07-04 DIAGNOSIS — E782 Mixed hyperlipidemia: Secondary | ICD-10-CM

## 2020-07-04 DIAGNOSIS — F331 Major depressive disorder, recurrent, moderate: Secondary | ICD-10-CM

## 2020-07-04 DIAGNOSIS — R21 Rash and other nonspecific skin eruption: Secondary | ICD-10-CM

## 2020-07-04 DIAGNOSIS — E785 Hyperlipidemia, unspecified: Secondary | ICD-10-CM

## 2020-07-04 DIAGNOSIS — F411 Generalized anxiety disorder: Secondary | ICD-10-CM | POA: Diagnosis not present

## 2020-07-04 DIAGNOSIS — E1169 Type 2 diabetes mellitus with other specified complication: Secondary | ICD-10-CM

## 2020-07-04 MED ORDER — TRIAMCINOLONE ACETONIDE 0.1 % EX CREA
1.0000 | TOPICAL_CREAM | Freq: Two times a day (BID) | CUTANEOUS | 1 refills | Status: DC
Start: 2020-07-04 — End: 2020-08-28

## 2020-07-04 NOTE — Progress Notes (Signed)
Assessment & Plan:  1. DM type 2 with diabetic dyslipidemia (HCC) Lab Results  Component Value Date   HGBA1C 7.1 (H) 04/10/2020   HGBA1C 8.4 (H) 06/29/2019    - Unknown control. Patient will return for fasting lab work. - Medications: continue current medications - Home glucose monitoring: continue monitoring - Patient is currently taking a statin. Patient is not taking an ACE-inhibitor/ARB.   Diabetes Health Maintenance Due  Topic Date Due   OPHTHALMOLOGY EXAM  Never done   HEMOGLOBIN A1C  10/11/2020   FOOT EXAM  04/10/2021   URINE MICROALBUMIN  07/04/2021    Lab Results  Component Value Date   LABMICR 8.9 07/04/2020   LABMICR 7.4 06/29/2019   - Microalbumin / creatinine urine ratio - CMP14+EGFR; Future - Lipid panel; Future - Bayer DCA Hb A1c Waived; Future - CBC with Differential/Platelet; Future  2. Mixed hyperlipidemia Labs to assess. - CMP14+EGFR; Future - Lipid panel; Future  3. Major depressive disorder, recurrent episode, moderate (HCC) Well controlled on current regimen.  - CMP14+EGFR; Future  4. Anxiety, generalized Well controlled on current regimen.  - CMP14+EGFR; Future  5. Rash - triamcinolone cream (KENALOG) 0.1 %; Apply 1 application topically 2 (two) times daily.  Dispense: 80 g; Refill: 1   Return in about 4 months (around 11/03/2020) for annual physical.  Hendricks Limes, MSN, APRN, FNP-C [name]  Subjective:    Patient ID: Hannah Mcdaniel, female    DOB: [birth year], 55 y.o.   MRN: [id number]  Patient Care Team: [name], FNP as PCP - General (Family Medicine) Angelia Mould, MD as Consulting Physician (Vascular Surgery) Harlen Labs, MD as Referring Physician (Optometry)   Chief Complaint:  Chief Complaint  Patient presents with   Diabetes   Anxiety   Hyperlipidemia    2 month follow up of chronic medical conditions     HPI: Hannah Mcdaniel is a 55 y.o. female presenting on  07/04/2020 for Diabetes, Anxiety, and Hyperlipidemia (2 month follow up of chronic medical conditions )  Diabetes: Patient presents for follow up of diabetes. Current symptoms include: none. Known diabetic complications: none. Medication compliance: yes. Current diet: in general, a "healthy" diet  . Current exercise: walking. Home blood sugar records: BGs are running  consistent with Hgb A1C, states they are under 100 every morning . Is she on ACE inhibitor or angiotensin II receptor blocker? No. Is she on a statin? Yes.   Depression/Anxiety: patient feels Prozac 40 mg with the Wellbutrin 450 mg is working well for her. She has previously taken Ativan, Xanax, Celexa, and Lexapro.   Depression screen [facility] 2/9 07/04/2020 05/16/2020 04/10/2020  Decreased Interest 0 2 3  Down, Depressed, Hopeless 1 3 3   PHQ - 2 Score 1 5 6   Altered sleeping 3 3 3   Tired, decreased energy 1 1 3   Change in appetite 0 0 2  Feeling bad or failure about yourself  1 1 3   Trouble concentrating 0 0 3  Moving slowly or fidgety/restless 0 0 2  Suicidal thoughts 0 0 1  PHQ-9 Score 6 10 23   Difficult doing work/chores Somewhat difficult Somewhat difficult Very difficult   GAD 7 : Generalized Anxiety Score 07/04/2020 05/16/2020 04/10/2020 06/29/2019  Nervous, Anxious, on Edge 1 1 3 3   Control/stop worrying 1 3 3 3   Worry too much - different things 0 3 2 2   Trouble relaxing 1 1 3 3   Restless 3 1 3  3  Easily annoyed or irritable 3 3 1 3   Afraid - awful might happen 1 1 3  0  Total GAD 7 Score 10 13 18 17   Anxiety Difficulty Somewhat difficult Somewhat difficult Very difficult Somewhat difficult    New complaints: Patient reports a rash to toes on both feet. Denies itching. States this occurred in the past and some cream took care of it.   Social history:  Relevant past medical, surgical, family and social history reviewed and updated as indicated. Interim medical history since our last visit reviewed.  Allergies and  medications reviewed and updated.  DATA REVIEWED: CHART IN EPIC  ROS: Negative unless specifically indicated above in HPI.    Current Outpatient Medications:    aspirin EC 81 MG tablet, Take 81 mg by mouth daily., Disp: , Rfl:    buPROPion (WELLBUTRIN XL) 150 MG 24 hr tablet, Take 1 tablet (150 mg total) by mouth daily. Take in addition to the 300 mg for a total of 450 mg daily., Disp: 90 tablet, Rfl: 1   buPROPion (WELLBUTRIN XL) 300 MG 24 hr tablet, Take 1 tablet (300 mg total) by mouth daily. Take in addition to 150 mg for a total of 450 mg daily., Disp: 90 tablet, Rfl: 1   Choline Fenofibrate (FENOFIBRIC ACID) 135 MG CPDR, Take 135 mg by mouth at bedtime. , Disp: , Rfl:    clopidogrel (PLAVIX) 75 MG tablet, Take 1 tablet (75 mg total) by mouth daily., Disp: 30 tablet, Rfl: 11   ezetimibe (ZETIA) 10 MG tablet, TAKE 1 TABLET BY MOUTH EVERY DAY, Disp: 90 tablet, Rfl: 0   flunisolide (NASALIDE) 25 MCG/ACT (0.025%) SOLN, Place 2 sprays into the nose 2 (two) times daily., Disp: 25 mL, Rfl: 5   FLUoxetine (PROZAC) 40 MG capsule, Take 1 capsule (40 mg total) by mouth daily., Disp: 90 capsule, Rfl: 1   levocetirizine (XYZAL) 5 MG tablet, TAKE 1 TABLET BY MOUTH EVERY DAY IN THE EVENING, Disp: 90 tablet, Rfl: 0   meclizine (ANTIVERT) 25 MG tablet, Take 1 tablet (25 mg total) by mouth 3 (three) times daily as needed for dizziness., Disp: 30 tablet, Rfl: 5   metFORMIN (GLUCOPHAGE) 500 MG tablet, Take 2 tablets (1,000 mg total) by mouth daily with breakfast AND 1 tablet (500 mg total) daily with supper., Disp: 270 tablet, Rfl: 1   montelukast (SINGULAIR) 10 MG tablet, TAKE 1 TABLET BY MOUTH EVERYDAY AT BEDTIME, Disp: 90 tablet, Rfl: 1   rosuvastatin (CRESTOR) 40 MG tablet, Take 1 tablet (40 mg total) by mouth daily., Disp: 90 tablet, Rfl: 1   SUMAtriptan (IMITREX) 100 MG tablet, Take 1 tablet (100 mg total) by mouth every 2 (two) hours as needed for migraine. May repeat in 2 hours if headache persists or  recurs., Disp: 10 tablet, Rfl: 2   clopidogrel (PLAVIX) 75 MG tablet, Take 1 tablet (75 mg total) by mouth daily., Disp: 90 tablet, Rfl: 2   Allergies  Allergen Reactions   Asa [Aspirin] Rash    Can take low-dose aspirin.   Codeine Rash   Penicillins Rash    Has patient had a PCN reaction causing immediate rash, facial/tongue/throat swelling, SOB or lightheadedness with hypotension:Yes Has patient had a PCN reaction causing severe rash involving mucus membranes or skin necrosis:Yes Has patient had a PCN reaction that required hospitalization:No Has patient had a PCN reaction occurring within the last 10 years:No If all of the above answers are "NO", then may proceed with Cephalosporin use.  Past Medical History:  Diagnosis Date   Anxiety    Arterial occlusive disease Nov. 2014   Arthritis    Claudication of lower extremity [facility]) Nov. 2014   Right Lower Extremity rest pain   Colon polyps    Depression    Diabetes mellitus without complication ([place])    GERD (gastroesophageal reflux disease)    Hyperlipidemia    Migraines    Vertigo     Past Surgical History:  Procedure Laterality Date   ABDOMINAL AORTAGRAM N/A 01/10/2013   Procedure: ABDOMINAL AORTAGRAM;  Surgeon: Rosetta Posner, MD;  Location: [facility] CATH LAB;  Service: Cardiovascular;  Laterality: N/A;   ABDOMINAL AORTOGRAM W/LOWER EXTREMITY Bilateral 07/16/2018   Procedure: ABDOMINAL AORTOGRAM W/LOWER EXTREMITY;  Surgeon: Angelia Mould, MD;  Location: [place] CV LAB;  Service: Cardiovascular;  Laterality: Bilateral;   CHOLECYSTECTOMY     Gall Bladder   iliac artery angioplasty and stent placement  01/10/13   LOWER EXTREMITY ANGIOGRAM Bilateral 01/10/2013   Procedure: LOWER EXTREMITY ANGIOGRAM;  Surgeon: Rosetta Posner, MD;  Location: [facility] CATH LAB;  Service: Cardiovascular;  Laterality: Bilateral;   PERCUTANEOUS STENT INTERVENTION Right 01/10/2013   Procedure: PERCUTANEOUS STENT INTERVENTION;  Surgeon: Rosetta Posner, MD;   Location: [facility] CATH LAB;  Service: Cardiovascular;  Laterality: Right;  rt common iliac stent   PERIPHERAL VASCULAR CATHETERIZATION N/A 11/26/2015   Procedure: Abdominal Aortogram w/Lower Extremity;  Surgeon: Angelia Mould, MD;  Location: [place] CV LAB;  Service: Cardiovascular;  Laterality: N/A;   PERIPHERAL VASCULAR CATHETERIZATION Right 11/26/2015   Procedure: Peripheral Vascular Balloon Angioplasty;  Surgeon: Angelia Mould, MD;  Location: [place] CV LAB;  Service: Cardiovascular;  Laterality: Right;  rt common iliac   PERIPHERAL VASCULAR INTERVENTION  07/16/2018   Procedure: PERIPHERAL VASCULAR INTERVENTION;  Surgeon: Angelia Mould, MD;  Location: [place] CV LAB;  Service: Cardiovascular;;   TOTAL ABDOMINAL HYSTERECTOMY      Social History   Socioeconomic History   Marital status: Married    Spouse name: Not on file   Number of children: 3   Years of education: Not on file   Highest education level: Not on file  Occupational History   Not on file  Tobacco Use   Smoking status: Current Every Day Smoker    Packs/day: 0.25    Types: Cigarettes    Start date: 05/16/1977   Smokeless tobacco: Never Used   Tobacco comment: 1/2 cigarette per day  Vaping Use   Vaping Use: Never used  Substance and Sexual Activity   Alcohol use: Yes    Alcohol/week: 1.0 standard drink    Types: 1 Shots of liquor per week    Comment: occ   Drug use: No   Sexual activity: Yes    Partners: Male    Birth control/protection: Surgical  Other Topics Concern   Not on file  Social History Narrative   Not on file   Social Determinants of Health   Financial Resource Strain: Not on file  Food Insecurity: Not on file  Transportation Needs: Not on file  Physical Activity: Not on file  Stress: Not on file  Social Connections: Not on file  Intimate Partner Violence: Not on file        Objective:    BP 115/61   Pulse 75   Temp (!) 97.4 F (36.3 C) (Temporal)   Ht  5' 1"  (1.549 m)   Wt 155 lb 9.6 oz (70.6 kg)  SpO2 95%   BMI 29.40 kg/m   Wt Readings from Last 3 Encounters:  07/04/20 155 lb 9.6 oz (70.6 kg)  06/01/20 153 lb 4.8 oz (69.5 kg)  04/10/20 153 lb 9.6 oz (69.7 kg)   Physical Exam Vitals reviewed.  Constitutional:      General: She is not in acute distress.    Appearance: Normal appearance. She is overweight. She is not ill-appearing, toxic-appearing or diaphoretic.  HENT:     Head: Normocephalic and atraumatic.  Eyes:     General: No scleral icterus.       Right eye: No discharge.        Left eye: No discharge.     Conjunctiva/sclera: Conjunctivae normal.  Cardiovascular:     Rate and Rhythm: Normal rate and regular rhythm.     Heart sounds: Normal heart sounds. No murmur heard.   No friction rub. No gallop.  Pulmonary:     Effort: Pulmonary effort is normal. No respiratory distress.     Breath sounds: Normal breath sounds. No stridor. No wheezing, rhonchi or rales.  Musculoskeletal:        General: Normal range of motion.     Cervical back: Normal range of motion.  Skin:    General: Skin is warm and dry.     Capillary Refill: Capillary refill takes less than 2 seconds.     Findings: Rash present. Rash is papular (bilateral great toes).  Neurological:     General: No focal deficit present.     Mental Status: She is alert and oriented to person, place, and time. Mental status is at baseline.  Psychiatric:        Mood and Affect: Mood normal.        Behavior: Behavior normal.        Thought Content: Thought content normal.        Judgment: Judgment normal.     No results found for: TSH Lab Results  Component Value Date   WBC 11.8 (H) 04/10/2020   HGB 15.3 04/10/2020   HCT 44.4 04/10/2020   MCV 88 04/10/2020   PLT 298 04/10/2020   Lab Results  Component Value Date   NA 140 04/10/2020   K 4.1 04/10/2020   CO2 21 04/10/2020   GLUCOSE 122 (H) 04/10/2020   BUN 10 04/10/2020   CREATININE 0.71 04/10/2020    BILITOT 0.4 04/10/2020   ALKPHOS 143 (H) 04/10/2020   AST 14 04/10/2020   ALT 12 04/10/2020   PROT 6.9 04/10/2020   ALBUMIN 4.5 04/10/2020   CALCIUM 9.6 04/10/2020   EGFR 100 04/10/2020   Lab Results  Component Value Date   CHOL 294 (H) 04/10/2020   Lab Results  Component Value Date   HDL 39 (L) 04/10/2020   Lab Results  Component Value Date   LDLCALC 178 (H) 04/10/2020   Lab Results  Component Value Date   TRIG 390 (H) 04/10/2020   Lab Results  Component Value Date   CHOLHDL 7.5 (H) 04/10/2020   Lab Results  Component Value Date   HGBA1C 7.1 (H) 04/10/2020

## 2020-07-05 LAB — MICROALBUMIN / CREATININE URINE RATIO
Creatinine, Urine: 179.3 mg/dL
Microalb/Creat Ratio: 5 mg/g creat (ref 0–29)
Microalbumin, Urine: 8.9 ug/mL

## 2020-07-15 ENCOUNTER — Encounter: Payer: Self-pay | Admitting: Family Medicine

## 2020-07-17 NOTE — Progress Notes (Signed)
RR sent

## 2020-08-09 ENCOUNTER — Other Ambulatory Visit: Payer: Self-pay

## 2020-08-09 ENCOUNTER — Other Ambulatory Visit: Payer: 59

## 2020-08-09 DIAGNOSIS — F331 Major depressive disorder, recurrent, moderate: Secondary | ICD-10-CM

## 2020-08-09 DIAGNOSIS — E1169 Type 2 diabetes mellitus with other specified complication: Secondary | ICD-10-CM

## 2020-08-09 DIAGNOSIS — E785 Hyperlipidemia, unspecified: Secondary | ICD-10-CM

## 2020-08-09 DIAGNOSIS — F411 Generalized anxiety disorder: Secondary | ICD-10-CM

## 2020-08-09 DIAGNOSIS — E782 Mixed hyperlipidemia: Secondary | ICD-10-CM

## 2020-08-09 LAB — BAYER DCA HB A1C WAIVED: HB A1C (BAYER DCA - WAIVED): 6.9 % (ref <7.0)

## 2020-08-10 LAB — CMP14+EGFR
ALT: 20 IU/L (ref 0–32)
AST: 21 IU/L (ref 0–40)
Albumin/Globulin Ratio: 2.4 — ABNORMAL HIGH (ref 1.2–2.2)
Albumin: 4.5 g/dL (ref 3.8–4.9)
Alkaline Phosphatase: 143 IU/L — ABNORMAL HIGH (ref 44–121)
BUN/Creatinine Ratio: 17 (ref 9–23)
BUN: 12 mg/dL (ref 6–24)
Bilirubin Total: 0.3 mg/dL (ref 0.0–1.2)
CO2: 23 mmol/L (ref 20–29)
Calcium: 9.8 mg/dL (ref 8.7–10.2)
Chloride: 101 mmol/L (ref 96–106)
Creatinine, Ser: 0.71 mg/dL (ref 0.57–1.00)
Globulin, Total: 1.9 g/dL (ref 1.5–4.5)
Glucose: 101 mg/dL — ABNORMAL HIGH (ref 65–99)
Potassium: 4.1 mmol/L (ref 3.5–5.2)
Sodium: 138 mmol/L (ref 134–144)
Total Protein: 6.4 g/dL (ref 6.0–8.5)
eGFR: 100 mL/min/{1.73_m2} (ref >59)

## 2020-08-10 LAB — LIPID PANEL
Chol/HDL Ratio: 7.4 ratio — ABNORMAL HIGH (ref 0.0–4.4)
Cholesterol, Total: 280 mg/dL — ABNORMAL HIGH (ref 100–199)
HDL: 38 mg/dL — ABNORMAL LOW (ref >39)
LDL Chol Calc (NIH): 176 mg/dL — ABNORMAL HIGH (ref 0–99)
Triglycerides: 339 mg/dL — ABNORMAL HIGH (ref 0–149)
VLDL Cholesterol Cal: 66 mg/dL — ABNORMAL HIGH (ref 5–40)

## 2020-08-10 LAB — CBC WITH DIFFERENTIAL/PLATELET
Basophils Absolute: 0.1 10*3/uL (ref 0.0–0.2)
Basos: 1 %
EOS (ABSOLUTE): 0.1 10*3/uL (ref 0.0–0.4)
Eos: 1 %
Hematocrit: 43.9 % (ref 34.0–46.6)
Hemoglobin: 15 g/dL (ref 11.1–15.9)
Immature Grans (Abs): 0 10*3/uL (ref 0.0–0.1)
Immature Granulocytes: 0 %
Lymphocytes Absolute: 3.9 10*3/uL — ABNORMAL HIGH (ref 0.7–3.1)
Lymphs: 45 %
MCH: 31.1 pg (ref 26.6–33.0)
MCHC: 34.2 g/dL (ref 31.5–35.7)
MCV: 91 fL (ref 79–97)
Monocytes Absolute: 0.4 10*3/uL (ref 0.1–0.9)
Monocytes: 5 %
Neutrophils Absolute: 4.3 10*3/uL (ref 1.4–7.0)
Neutrophils: 48 %
Platelets: 253 10*3/uL (ref 150–450)
RBC: 4.82 x10E6/uL (ref 3.77–5.28)
RDW: 12.2 % (ref 11.7–15.4)
WBC: 8.7 10*3/uL (ref 3.4–10.8)

## 2020-08-16 ENCOUNTER — Other Ambulatory Visit: Payer: Self-pay | Admitting: Family Medicine

## 2020-08-16 ENCOUNTER — Other Ambulatory Visit: Payer: Self-pay

## 2020-08-16 ENCOUNTER — Ambulatory Visit (INDEPENDENT_AMBULATORY_CARE_PROVIDER_SITE_OTHER): Payer: 59 | Admitting: Pharmacist

## 2020-08-16 DIAGNOSIS — E782 Mixed hyperlipidemia: Secondary | ICD-10-CM | POA: Diagnosis not present

## 2020-08-16 MED ORDER — NEXLETOL 180 MG PO TABS: 1.0000 | ORAL_TABLET | Freq: Every day | ORAL | 3 refills | Status: DC

## 2020-08-16 NOTE — Progress Notes (Signed)
08/17/2020 Name: Hannah Mcdaniel MRN: [id number] DOB: Jun 22, 1965  HPI:  Hannah Mcdaniel is a 55 y.o. female patient referred to lipid clinic by PCP. PMH is significant for Past Medical History:  Diagnosis Date   Anxiety    Arterial occlusive disease Nov. 2014   Arthritis    Claudication of lower extremity ([place]) Nov. 2014   Right Lower Extremity rest pain   Colon polyps    Depression    Diabetes mellitus without complication (HCC)    GERD (gastroesophageal reflux disease)    Hyperlipidemia    Migraines    Vertigo    Intolerances: N/A  Risk Factors: T2DM, HTN, SMOKER  LDL goal: <70  Diet: recommend mediterranean diet   Exercise: n/a  Family History: mother has cholesterol issues   Social History:  Labs: Current Outpatient Medications on File Prior to Visit  Medication Sig Dispense Refill   aspirin EC 81 MG tablet Take 81 mg by mouth daily.     buPROPion (WELLBUTRIN XL) 150 MG 24 hr tablet Take 1 tablet (150 mg total) by mouth daily. Take in addition to the 300 mg for a total of 450 mg daily. 90 tablet 1   buPROPion (WELLBUTRIN XL) 300 MG 24 hr tablet Take 1 tablet (300 mg total) by mouth daily. Take in addition to 150 mg for a total of 450 mg daily. 90 tablet 1   Choline Fenofibrate (FENOFIBRIC ACID) 135 MG CPDR Take 135 mg by mouth at bedtime.      clopidogrel (PLAVIX) 75 MG tablet Take 1 tablet (75 mg total) by mouth daily. 30 tablet 11   ezetimibe (ZETIA) 10 MG tablet TAKE 1 TABLET BY MOUTH EVERY DAY 90 tablet 0   flunisolide (NASALIDE) 25 MCG/ACT (0.025%) SOLN Place 2 sprays into the nose 2 (two) times daily. 25 mL 5   FLUoxetine (PROZAC) 40 MG capsule Take 1 capsule (40 mg total) by mouth daily. 90 capsule 1   levocetirizine (XYZAL) 5 MG tablet TAKE 1 TABLET BY MOUTH EVERY DAY IN THE EVENING 90 tablet 0   meclizine (ANTIVERT) 25 MG tablet Take 1 tablet (25 mg total) by mouth 3 (three) times daily as needed for dizziness. 30 tablet 5   metFORMIN (GLUCOPHAGE) 500  MG tablet Take 2 tablets (1,000 mg total) by mouth daily with breakfast AND 1 tablet (500 mg total) daily with supper. 270 tablet 1   montelukast (SINGULAIR) 10 MG tablet TAKE 1 TABLET BY MOUTH EVERYDAY AT BEDTIME 90 tablet 1   rosuvastatin (CRESTOR) 40 MG tablet Take 1 tablet (40 mg total) by mouth daily. 90 tablet 1   SUMAtriptan (IMITREX) 100 MG tablet Take 1 tablet (100 mg total) by mouth every 2 (two) hours as needed for migraine. May repeat in 2 hours if headache persists or recurs. 10 tablet 2   triamcinolone cream (KENALOG) 0.1 % Apply 1 application topically 2 (two) times daily. 80 g 1   No current facility-administered medications on file prior to visit.       Allergies  Allergen Reactions   Asa [Aspirin] Rash    Can take low-dose aspirin.   Codeine Rash   Penicillins Rash    Has patient had a PCN reaction causing immediate rash, facial/tongue/throat swelling, SOB or lightheadedness with hypotension:Yes Has patient had a PCN reaction causing severe rash involving mucus membranes or skin necrosis:Yes Has patient had a PCN reaction that required hospitalization:No Has patient had a PCN reaction occurring within the last 10 years:No  If all of the above answers are "NO", then may proceed with Cephalosporin use.      Lipid Panel     Component Value Date/Time   CHOL 280 (H) 08/09/2020 1222   TRIG 339 (H) 08/09/2020 1222   HDL 38 (L) 08/09/2020 1222   CHOLHDL 7.4 (H) 08/09/2020 1222   LDLCALC 176 (H) 08/09/2020 1222   LABVLDL 66 (H) 08/09/2020 1222     Assessment/Plan:     1. Hyperlipidemia - Continue statin as prescribed  Continue zetia Will trial nexletol (consider repatha if not covered)  Regina Eck, PharmD, BCPS Clinical Pharmacist, [facility]  II Phone [phone]  .

## 2020-08-23 NOTE — Telephone Encounter (Signed)
PA started for Nexletol   (Key: [id number]  Drug  Nexletol 180MG  tablets  Form  [facility] Medicare Prior Authorization Request Form  Prior Authorization for Medicare Prescription Drug Coverage Determination   [phone]  [phone]    Sent to plan

## 2020-08-24 ENCOUNTER — Telehealth: Payer: Self-pay | Admitting: *Deleted

## 2020-08-24 NOTE — Telephone Encounter (Signed)
Nexletol '180MG'$  tablets PA started 7/21  (Key: [id number])   Question response done 7/22 - re-sent to plan

## 2020-08-27 NOTE — Telephone Encounter (Signed)
Approved from 08/24/20-08/23/21

## 2020-08-27 NOTE — Telephone Encounter (Signed)
[facility] aware.  

## 2020-08-28 ENCOUNTER — Other Ambulatory Visit: Payer: Self-pay | Admitting: Family Medicine

## 2020-08-28 DIAGNOSIS — R21 Rash and other nonspecific skin eruption: Secondary | ICD-10-CM

## 2020-08-28 DIAGNOSIS — E1169 Type 2 diabetes mellitus with other specified complication: Secondary | ICD-10-CM

## 2020-08-28 DIAGNOSIS — E782 Mixed hyperlipidemia: Secondary | ICD-10-CM

## 2020-08-28 DIAGNOSIS — J302 Other seasonal allergic rhinitis: Secondary | ICD-10-CM

## 2020-09-05 ENCOUNTER — Ambulatory Visit: Payer: No Typology Code available for payment source | Admitting: Vascular Surgery

## 2020-09-05 ENCOUNTER — Encounter (HOSPITAL_COMMUNITY): Payer: No Typology Code available for payment source

## 2020-10-03 ENCOUNTER — Ambulatory Visit: Payer: 59 | Admitting: Vascular Surgery

## 2020-10-03 ENCOUNTER — Other Ambulatory Visit: Payer: Self-pay

## 2020-10-03 ENCOUNTER — Other Ambulatory Visit: Payer: Self-pay | Admitting: Family Medicine

## 2020-10-03 ENCOUNTER — Encounter: Payer: Self-pay | Admitting: Vascular Surgery

## 2020-10-03 ENCOUNTER — Ambulatory Visit (HOSPITAL_COMMUNITY)
Admission: RE | Admit: 2020-10-03 | Discharge: 2020-10-03 | Disposition: A | Discharge status: Home / Self Care | Payer: 59 | Source: Ambulatory Visit | Attending: Vascular Surgery | Admitting: Vascular Surgery

## 2020-10-03 ENCOUNTER — Ambulatory Visit (INDEPENDENT_AMBULATORY_CARE_PROVIDER_SITE_OTHER)
Admission: RE | Admit: 2020-10-03 | Discharge: 2020-10-03 | Disposition: A | Discharge status: Home / Self Care | Payer: 59 | Source: Ambulatory Visit | Attending: Physician Assistant | Admitting: Physician Assistant

## 2020-10-03 VITALS — BP 142/70 | HR 56 | Temp 97.9°F | Resp 14 | Ht 61.0 in | Wt 148.0 lb

## 2020-10-03 DIAGNOSIS — I739 Peripheral vascular disease, unspecified: Secondary | ICD-10-CM | POA: Insufficient documentation

## 2020-10-03 DIAGNOSIS — Z95828 Presence of other vascular implants and grafts: Secondary | ICD-10-CM

## 2020-10-03 DIAGNOSIS — F411 Generalized anxiety disorder: Secondary | ICD-10-CM

## 2020-10-03 DIAGNOSIS — F331 Major depressive disorder, recurrent, moderate: Secondary | ICD-10-CM

## 2020-10-03 NOTE — H&P (View-Only) (Signed)
REASON FOR VISIT:   Follow-up of peripheral vascular disease.  MEDICAL ISSUES:   PERIPHERAL VASCULAR DISEASE: This patient has developed recurrent claudication of the right hip and has evidence of recurrent stenosis in her right common iliac artery and right external iliac artery stent.  I think she is at risk for occlusion and I recommended that we proceed with arteriography.  I reviewed her previous study back in June 2020 and she had a fairly long segment occlusion of the common iliac artery which we opened with a VBX stent.  We then used the balloon expandable stent down into the external iliac artery in order to preserve flow into the hypogastric artery.  She is developed recurrent stenosis in both areas.  I have discussed the indications for arteriography and the potential complications and this has been scheduled for this Friday.  All of her questions were answered she is agreeable to proceed.  HPI:   Hannah Mcdaniel is a pleasant 55 y.o. female who comes in for follow-up of her peripheral vascular disease.  In 2014 she had a right common iliac artery stent.  In 2017 she had angioplasty of a recurrent right common iliac stent.  On 07/16/2018 she had a fairly long segment occlusion of the right common iliac artery.  I addressed this with angioplasty and stenting using a 7 X 39 VBX stent.  In addition she had angioplasty and stenting of the right common and external iliac artery using a 7 X 29 balloon expandable stent trying to preserve flow into the hypogastric artery which we did.  She comes in for routine follow-up visit.  Since I saw her last she has developed some right hip claudication.  This is brought on by ambulation and relieved with rest.  He denies any symptoms on the left side.  She denies any history of rest pain or nonhealing ulcers.  She is almost plegic with tobacco.  She smokes a cigarette every 1 or 2 days.  She is on aspirin, Plavix, and a statin.  There have been no  other significant changes to her medical history.  Past Medical History:  Diagnosis Date   Anxiety    Arterial occlusive disease Nov. 2014   Arthritis    Claudication of lower extremity ([place]) Nov. 2014   Right Lower Extremity rest pain   Colon polyps    Depression    Diabetes mellitus without complication ([place])    GERD (gastroesophageal reflux disease)    Hyperlipidemia    Migraines    Vertigo     Family History  Problem Relation Age of Onset   Diabetes Mother    Hyperlipidemia Mother    Hypertension Mother    Varicose Veins Mother    COPD Mother    Stroke Mother    Anxiety disorder Mother    Depression Mother    Ovarian cancer Mother    Acute myelogenous leukemia Father    Diabetes Brother    Hypertension Brother    Hyperlipidemia Brother    Stroke Maternal Grandmother    Anxiety disorder Maternal Grandmother    Depression Maternal Grandmother    Breast cancer Maternal Grandmother    Diabetes Maternal Grandmother    Heart disease Maternal Grandfather    Lung cancer Paternal Grandfather    Other Brother        Blood disease   Anxiety disorder Daughter    Migraines Daughter    Migraines Daughter    Migraines Son  Seizures Son     SOCIAL HISTORY: Social History   Tobacco Use   Smoking status: Every Day    Packs/day: 0.25    Types: Cigarettes    Start date: 05/16/1977   Smokeless tobacco: Never   Tobacco comments:    1/2 cigarette per day  Substance Use Topics   Alcohol use: Yes    Alcohol/week: 1.0 standard drink    Types: 1 Shots of liquor per week    Comment: occ    Allergies  Allergen Reactions   Asa [Aspirin] Rash    Can take low-dose aspirin.   Codeine Rash   Penicillins Rash    Has patient had a PCN reaction causing immediate rash, facial/tongue/throat swelling, SOB or lightheadedness with hypotension:Yes Has patient had a PCN reaction causing severe rash involving mucus membranes or skin necrosis:Yes Has patient had a PCN reaction that  required hospitalization:No Has patient had a PCN reaction occurring within the last 10 years:No If all of the above answers are "NO", then may proceed with Cephalosporin use.     Current Outpatient Medications  Medication Sig Dispense Refill   aspirin EC 81 MG tablet Take 81 mg by mouth daily.     Bempedoic Acid (NEXLETOL) 180 MG TABS Take 1 tablet by mouth daily. 30 tablet 3   buPROPion (WELLBUTRIN XL) 150 MG 24 hr tablet TAKE 1 TABLET (150 MG TOTAL) DAILY. TAKE IN ADDITION TO THE 300 MG FOR A TOTAL OF 450 MG DAILY. 90 tablet 0   Choline Fenofibrate (FENOFIBRIC ACID) 135 MG CPDR Take 135 mg by mouth at bedtime.      clopidogrel (PLAVIX) 75 MG tablet Take 1 tablet (75 mg total) by mouth daily. 30 tablet 11   ezetimibe (ZETIA) 10 MG tablet TAKE 1 TABLET BY MOUTH EVERY DAY 90 tablet 0   flunisolide (NASALIDE) 25 MCG/ACT (0.025%) SOLN PLACE 2 SPRAYS INTO THE NOSE 2 (TWO) TIMES DAILY. 75 mL 0   FLUoxetine (PROZAC) 40 MG capsule Take 1 capsule (40 mg total) by mouth daily. 90 capsule 1   levocetirizine (XYZAL) 5 MG tablet TAKE 1 TABLET BY MOUTH EVERY DAY IN THE EVENING 90 tablet 0   meclizine (ANTIVERT) 25 MG tablet Take 1 tablet (25 mg total) by mouth 3 (three) times daily as needed for dizziness. 30 tablet 5   metFORMIN (GLUCOPHAGE) 500 MG tablet TAKE 2 TABLETS DAILY WITH BREAKFAST AND 1 TABLET DAILY WITH SUPPER. 270 tablet 0   montelukast (SINGULAIR) 10 MG tablet TAKE 1 TABLET BY MOUTH EVERYDAY AT BEDTIME 90 tablet 1   rosuvastatin (CRESTOR) 40 MG tablet TAKE 1 TABLET BY MOUTH EVERY DAY 90 tablet 0   SUMAtriptan (IMITREX) 100 MG tablet Take 1 tablet (100 mg total) by mouth every 2 (two) hours as needed for migraine. May repeat in 2 hours if headache persists or recurs. 10 tablet 2   triamcinolone cream (KENALOG) 0.1 % APPLY TO AFFECTED AREA TWICE A DAY 60 g 1   buPROPion (WELLBUTRIN XL) 300 MG 24 hr tablet TAKE 1 TABLET (300 MG TOTAL) BY MOUTH DAILY. TAKE IN ADDITION TO 150 MG FOR A TOTAL OF  450 MG DAILY. (Patient not taking: Reported on 10/03/2020) 90 tablet 0   No current facility-administered medications for this visit.    REVIEW OF SYSTEMS:  '[X]'$  denotes positive finding, '[ ]'$  denotes negative finding Cardiac  Comments:  Chest pain or chest pressure:    Shortness of breath upon exertion:    Short of breath when  lying flat:    Irregular heart rhythm:        Vascular    Pain in calf, thigh, or hip brought on by ambulation: x   Pain in feet at night that wakes you up from your sleep:     Blood clot in your veins:    Leg swelling:         Pulmonary    Oxygen at home:    Productive cough:     Wheezing:         Neurologic    Sudden weakness in arms or legs:     Sudden numbness in arms or legs:     Sudden onset of difficulty speaking or slurred speech:    Temporary loss of vision in one eye:     Problems with dizziness:         Gastrointestinal    Blood in stool:     Vomited blood:         Genitourinary    Burning when urinating:     Blood in urine:        Psychiatric    Major depression:         Hematologic    Bleeding problems:    Problems with blood clotting too easily:        Skin    Rashes or ulcers:        Constitutional    Fever or chills:     PHYSICAL EXAM:   Vitals:   10/03/20 0947  BP: (!) 142/70  Pulse: (!) 56  Resp: 14  Temp: 97.9 F (36.6 C)  TempSrc: Temporal  SpO2: 97%  Weight: 148 lb (67.1 kg)  Height: '5\' 1"'$  (1.549 m)    GENERAL: The patient is a well-nourished female, in no acute distress. The vital signs are documented above. CARDIAC: There is a regular rate and rhythm.  VASCULAR: I do not detect carotid bruits. On the right side she has a diminished femoral pulse.  I cannot palpate popliteal or pedal pulses. On the left side she has a palpable femoral, popliteal, and posterior tibial pulse. PULMONARY: There is good air exchange bilaterally without wheezing or rales. ABDOMEN: Soft and non-tender with normal pitched  bowel sounds.  MUSCULOSKELETAL: There are no major deformities or cyanosis. NEUROLOGIC: No focal weakness or paresthesias are detected. SKIN: There are no ulcers or rashes noted. PSYCHIATRIC: The patient has a normal affect.  DATA:    ARTERIAL DOPPLER STUDY: I have independently interpreted her arterial Doppler study today.  On the right side is a biphasic dorsalis pedis and posterior tibial signal.  ABI is 81%.  Toe pressure 69 mmHg.  On the left side there is a biphasic dorsalis pedis and posterior tibial signal.  ABI is 100%.  Toe pressure is 73 mmHg.  ARTERIAL DUPLEX: I have independently interpreted her arterial duplex scan today.  The stent is not well visualized on the right.  There are some elevated velocities in the mid common iliac artery up to 285 cm/s.  There are elevated velocities in the right external iliac artery also up to 575 cm/s.  [name] [facility] [phone]

## 2020-10-03 NOTE — Progress Notes (Signed)
REASON FOR VISIT:   Follow-up of peripheral vascular disease.  MEDICAL ISSUES:   PERIPHERAL VASCULAR DISEASE: This patient has developed recurrent claudication of the right hip and has evidence of recurrent stenosis in her right common iliac artery and right external iliac artery stent.  I think she is at risk for occlusion and I recommended that we proceed with arteriography.  I reviewed her previous study back in June 2020 and she had a fairly long segment occlusion of the common iliac artery which we opened with a VBX stent.  We then used the balloon expandable stent down into the external iliac artery in order to preserve flow into the hypogastric artery.  She is developed recurrent stenosis in both areas.  I have discussed the indications for arteriography and the potential complications and this has been scheduled for this Friday.  All of her questions were answered she is agreeable to proceed.  HPI:   Hannah Mcdaniel is a pleasant 55 y.o. female who comes in for follow-up of her peripheral vascular disease.  In 2014 she had a right common iliac artery stent.  In 2017 she had angioplasty of a recurrent right common iliac stent.  On 07/16/2018 she had a fairly long segment occlusion of the right common iliac artery.  I addressed this with angioplasty and stenting using a 7 X 39 VBX stent.  In addition she had angioplasty and stenting of the right common and external iliac artery using a 7 X 29 balloon expandable stent trying to preserve flow into the hypogastric artery which we did.  She comes in for routine follow-up visit.  Since I saw her last she has developed some right hip claudication.  This is brought on by ambulation and relieved with rest.  He denies any symptoms on the left side.  She denies any history of rest pain or nonhealing ulcers.  She is almost plegic with tobacco.  She smokes a cigarette every 1 or 2 days.  She is on aspirin, Plavix, and a statin.  There have been no  other significant changes to her medical history.  Past Medical History:  Diagnosis Date   Anxiety    Arterial occlusive disease Nov. 2014   Arthritis    Claudication of lower extremity ([place]) Nov. 2014   Right Lower Extremity rest pain   Colon polyps    Depression    Diabetes mellitus without complication ([place])    GERD (gastroesophageal reflux disease)    Hyperlipidemia    Migraines    Vertigo     Family History  Problem Relation Age of Onset   Diabetes Mother    Hyperlipidemia Mother    Hypertension Mother    Varicose Veins Mother    COPD Mother    Stroke Mother    Anxiety disorder Mother    Depression Mother    Ovarian cancer Mother    Acute myelogenous leukemia Father    Diabetes Brother    Hypertension Brother    Hyperlipidemia Brother    Stroke Maternal Grandmother    Anxiety disorder Maternal Grandmother    Depression Maternal Grandmother    Breast cancer Maternal Grandmother    Diabetes Maternal Grandmother    Heart disease Maternal Grandfather    Lung cancer Paternal Grandfather    Other Brother        Blood disease   Anxiety disorder Daughter    Migraines Daughter    Migraines Daughter    Migraines Son  Seizures Son     SOCIAL HISTORY: Social History   Tobacco Use   Smoking status: Every Day    Packs/day: 0.25    Types: Cigarettes    Start date: 05/16/1977   Smokeless tobacco: Never   Tobacco comments:    1/2 cigarette per day  Substance Use Topics   Alcohol use: Yes    Alcohol/week: 1.0 standard drink    Types: 1 Shots of liquor per week    Comment: occ    Allergies  Allergen Reactions   Asa [Aspirin] Rash    Can take low-dose aspirin.   Codeine Rash   Penicillins Rash    Has patient had a PCN reaction causing immediate rash, facial/tongue/throat swelling, SOB or lightheadedness with hypotension:Yes Has patient had a PCN reaction causing severe rash involving mucus membranes or skin necrosis:Yes Has patient had a PCN reaction that  required hospitalization:No Has patient had a PCN reaction occurring within the last 10 years:No If all of the above answers are "NO", then may proceed with Cephalosporin use.     Current Outpatient Medications  Medication Sig Dispense Refill   aspirin EC 81 MG tablet Take 81 mg by mouth daily.     Bempedoic Acid (NEXLETOL) 180 MG TABS Take 1 tablet by mouth daily. 30 tablet 3   buPROPion (WELLBUTRIN XL) 150 MG 24 hr tablet TAKE 1 TABLET (150 MG TOTAL) DAILY. TAKE IN ADDITION TO THE 300 MG FOR A TOTAL OF 450 MG DAILY. 90 tablet 0   Choline Fenofibrate (FENOFIBRIC ACID) 135 MG CPDR Take 135 mg by mouth at bedtime.      clopidogrel (PLAVIX) 75 MG tablet Take 1 tablet (75 mg total) by mouth daily. 30 tablet 11   ezetimibe (ZETIA) 10 MG tablet TAKE 1 TABLET BY MOUTH EVERY DAY 90 tablet 0   flunisolide (NASALIDE) 25 MCG/ACT (0.025%) SOLN PLACE 2 SPRAYS INTO THE NOSE 2 (TWO) TIMES DAILY. 75 mL 0   FLUoxetine (PROZAC) 40 MG capsule Take 1 capsule (40 mg total) by mouth daily. 90 capsule 1   levocetirizine (XYZAL) 5 MG tablet TAKE 1 TABLET BY MOUTH EVERY DAY IN THE EVENING 90 tablet 0   meclizine (ANTIVERT) 25 MG tablet Take 1 tablet (25 mg total) by mouth 3 (three) times daily as needed for dizziness. 30 tablet 5   metFORMIN (GLUCOPHAGE) 500 MG tablet TAKE 2 TABLETS DAILY WITH BREAKFAST AND 1 TABLET DAILY WITH SUPPER. 270 tablet 0   montelukast (SINGULAIR) 10 MG tablet TAKE 1 TABLET BY MOUTH EVERYDAY AT BEDTIME 90 tablet 1   rosuvastatin (CRESTOR) 40 MG tablet TAKE 1 TABLET BY MOUTH EVERY DAY 90 tablet 0   SUMAtriptan (IMITREX) 100 MG tablet Take 1 tablet (100 mg total) by mouth every 2 (two) hours as needed for migraine. May repeat in 2 hours if headache persists or recurs. 10 tablet 2   triamcinolone cream (KENALOG) 0.1 % APPLY TO AFFECTED AREA TWICE A DAY 60 g 1   buPROPion (WELLBUTRIN XL) 300 MG 24 hr tablet TAKE 1 TABLET (300 MG TOTAL) BY MOUTH DAILY. TAKE IN ADDITION TO 150 MG FOR A TOTAL OF  450 MG DAILY. (Patient not taking: Reported on 10/03/2020) 90 tablet 0   No current facility-administered medications for this visit.    REVIEW OF SYSTEMS:  '[X]'$  denotes positive finding, '[ ]'$  denotes negative finding Cardiac  Comments:  Chest pain or chest pressure:    Shortness of breath upon exertion:    Short of breath when  lying flat:    Irregular heart rhythm:        Vascular    Pain in calf, thigh, or hip brought on by ambulation: x   Pain in feet at night that wakes you up from your sleep:     Blood clot in your veins:    Leg swelling:         Pulmonary    Oxygen at home:    Productive cough:     Wheezing:         Neurologic    Sudden weakness in arms or legs:     Sudden numbness in arms or legs:     Sudden onset of difficulty speaking or slurred speech:    Temporary loss of vision in one eye:     Problems with dizziness:         Gastrointestinal    Blood in stool:     Vomited blood:         Genitourinary    Burning when urinating:     Blood in urine:        Psychiatric    Major depression:         Hematologic    Bleeding problems:    Problems with blood clotting too easily:        Skin    Rashes or ulcers:        Constitutional    Fever or chills:     PHYSICAL EXAM:   Vitals:   10/03/20 0947  BP: (!) 142/70  Pulse: (!) 56  Resp: 14  Temp: 97.9 F (36.6 C)  TempSrc: Temporal  SpO2: 97%  Weight: 148 lb (67.1 kg)  Height: '5\' 1"'$  (1.549 m)    GENERAL: The patient is a well-nourished female, in no acute distress. The vital signs are documented above. CARDIAC: There is a regular rate and rhythm.  VASCULAR: I do not detect carotid bruits. On the right side she has a diminished femoral pulse.  I cannot palpate popliteal or pedal pulses. On the left side she has a palpable femoral, popliteal, and posterior tibial pulse. PULMONARY: There is good air exchange bilaterally without wheezing or rales. ABDOMEN: Soft and non-tender with normal pitched  bowel sounds.  MUSCULOSKELETAL: There are no major deformities or cyanosis. NEUROLOGIC: No focal weakness or paresthesias are detected. SKIN: There are no ulcers or rashes noted. PSYCHIATRIC: The patient has a normal affect.  DATA:    ARTERIAL DOPPLER STUDY: I have independently interpreted her arterial Doppler study today.  On the right side is a biphasic dorsalis pedis and posterior tibial signal.  ABI is 81%.  Toe pressure 69 mmHg.  On the left side there is a biphasic dorsalis pedis and posterior tibial signal.  ABI is 100%.  Toe pressure is 73 mmHg.  ARTERIAL DUPLEX: I have independently interpreted her arterial duplex scan today.  The stent is not well visualized on the right.  There are some elevated velocities in the mid common iliac artery up to 285 cm/s.  There are elevated velocities in the right external iliac artery also up to 575 cm/s.  [name] [facility] [phone]

## 2020-10-05 ENCOUNTER — Other Ambulatory Visit: Payer: Self-pay

## 2020-10-05 ENCOUNTER — Ambulatory Visit (HOSPITAL_COMMUNITY)
Admission: RE | Admit: 2020-10-05 | Discharge: 2020-10-05 | Disposition: A | Discharge status: Home / Self Care | Payer: 59 | Attending: Vascular Surgery | Admitting: Vascular Surgery

## 2020-10-05 ENCOUNTER — Encounter (HOSPITAL_COMMUNITY)
Admission: RE | Disposition: A | Discharge status: Home / Self Care | Payer: Self-pay | Source: Home / Self Care | Attending: Vascular Surgery

## 2020-10-05 DIAGNOSIS — Z7982 Long term (current) use of aspirin: Secondary | ICD-10-CM | POA: Insufficient documentation

## 2020-10-05 DIAGNOSIS — Z885 Allergy status to narcotic agent status: Secondary | ICD-10-CM | POA: Insufficient documentation

## 2020-10-05 DIAGNOSIS — I70201 Unspecified atherosclerosis of native arteries of extremities, right leg: Secondary | ICD-10-CM

## 2020-10-05 DIAGNOSIS — Z7902 Long term (current) use of antithrombotics/antiplatelets: Secondary | ICD-10-CM | POA: Diagnosis not present

## 2020-10-05 DIAGNOSIS — Z79899 Other long term (current) drug therapy: Secondary | ICD-10-CM | POA: Insufficient documentation

## 2020-10-05 DIAGNOSIS — I708 Atherosclerosis of other arteries: Secondary | ICD-10-CM | POA: Diagnosis not present

## 2020-10-05 DIAGNOSIS — Z7984 Long term (current) use of oral hypoglycemic drugs: Secondary | ICD-10-CM | POA: Diagnosis not present

## 2020-10-05 DIAGNOSIS — F17210 Nicotine dependence, cigarettes, uncomplicated: Secondary | ICD-10-CM | POA: Diagnosis not present

## 2020-10-05 DIAGNOSIS — I70219 Atherosclerosis of native arteries of extremities with intermittent claudication, unspecified extremity: Secondary | ICD-10-CM | POA: Diagnosis present

## 2020-10-05 DIAGNOSIS — E1151 Type 2 diabetes mellitus with diabetic peripheral angiopathy without gangrene: Secondary | ICD-10-CM | POA: Diagnosis present

## 2020-10-05 DIAGNOSIS — Z833 Family history of diabetes mellitus: Secondary | ICD-10-CM | POA: Insufficient documentation

## 2020-10-05 DIAGNOSIS — Z886 Allergy status to analgesic agent status: Secondary | ICD-10-CM | POA: Insufficient documentation

## 2020-10-05 DIAGNOSIS — Z880 Allergy status to penicillin: Secondary | ICD-10-CM | POA: Diagnosis not present

## 2020-10-05 HISTORY — PX: PERIPHERAL VASCULAR INTERVENTION: CATH118257

## 2020-10-05 HISTORY — PX: ABDOMINAL AORTOGRAM W/LOWER EXTREMITY: CATH118223

## 2020-10-05 LAB — POCT I-STAT, CHEM 8
BUN: 16 mg/dL (ref 6–20)
Calcium, Ion: 1.05 mmol/L — ABNORMAL LOW (ref 1.15–1.40)
Chloride: 105 mmol/L (ref 98–111)
Creatinine, Ser: 0.6 mg/dL (ref 0.44–1.00)
Glucose, Bld: 140 mg/dL — ABNORMAL HIGH (ref 70–99)
HCT: 43 % (ref 36.0–46.0)
Hemoglobin: 14.6 g/dL (ref 12.0–15.0)
Potassium: 5.1 mmol/L (ref 3.5–5.1)
Sodium: 137 mmol/L (ref 135–145)
TCO2: 27 mmol/L (ref 22–32)

## 2020-10-05 LAB — POCT ACTIVATED CLOTTING TIME
Activated Clotting Time: 254 seconds
Activated Clotting Time: 225 seconds
Activated Clotting Time: 196 s
Activated Clotting Time: 161 seconds

## 2020-10-05 LAB — GLUCOSE, CAPILLARY
Glucose-Capillary: 143 mg/dL — ABNORMAL HIGH (ref 70–99)
Glucose-Capillary: 113 mg/dL — ABNORMAL HIGH (ref 70–99)

## 2020-10-05 SURGERY — ABDOMINAL AORTOGRAM W/LOWER EXTREMITY
Anesthesia: LOCAL | Laterality: Right

## 2020-10-05 MED ORDER — IODIXANOL 320 MG/ML IV SOLN
INTRAVENOUS | Status: DC | PRN
  Administered 2020-10-05: 110 mL via INTRA_ARTERIAL

## 2020-10-05 MED ORDER — HEPARIN (PORCINE) IN NACL 1000-0.9 UT/500ML-% IV SOLN
INTRAVENOUS | Status: AC
  Filled 2020-10-05: qty 500

## 2020-10-05 MED ORDER — ONDANSETRON HCL 4 MG/2ML IJ SOLN: 4.0000 mg | Freq: Four times a day (QID) | INTRAMUSCULAR | Status: DC | PRN

## 2020-10-05 MED ORDER — LIDOCAINE HCL (PF) 1 % IJ SOLN
INTRAMUSCULAR | Status: DC | PRN
  Administered 2020-10-05: 15 mL via INTRADERMAL

## 2020-10-05 MED ORDER — HEPARIN (PORCINE) IN NACL 1000-0.9 UT/500ML-% IV SOLN
INTRAVENOUS | Status: DC | PRN
  Administered 2020-10-05 (×2): 500 mL

## 2020-10-05 MED ORDER — FENTANYL CITRATE (PF) 100 MCG/2ML IJ SOLN
INTRAMUSCULAR | Status: AC
  Filled 2020-10-05: qty 2

## 2020-10-05 MED ORDER — SODIUM CHLORIDE 0.9 % WEIGHT BASED INFUSION
1.0000 mL/kg/h | INTRAVENOUS | Status: DC
  Administered 2020-10-05: 1 mL/kg/h via INTRAVENOUS

## 2020-10-05 MED ORDER — HEPARIN SODIUM (PORCINE) 1000 UNIT/ML IJ SOLN
INTRAMUSCULAR | Status: DC | PRN
  Administered 2020-10-05: 7000 [IU] via INTRAVENOUS

## 2020-10-05 MED ORDER — SODIUM CHLORIDE 0.9 % IV SOLN: INTRAVENOUS | Status: DC

## 2020-10-05 MED ORDER — HYDRALAZINE HCL 20 MG/ML IJ SOLN
5.0000 mg | INTRAMUSCULAR | Status: DC | PRN
Start: 2020-10-05 — End: 2020-10-05

## 2020-10-05 MED ORDER — MIDAZOLAM HCL 2 MG/2ML IJ SOLN
INTRAMUSCULAR | Status: DC | PRN
  Administered 2020-10-05 (×2): 1 mg via INTRAVENOUS

## 2020-10-05 MED ORDER — ACETAMINOPHEN 325 MG PO TABS: 650.0000 mg | ORAL_TABLET | ORAL | Status: DC | PRN

## 2020-10-05 MED ORDER — SODIUM CHLORIDE 0.9% FLUSH: 3.0000 mL | Freq: Two times a day (BID) | INTRAVENOUS | Status: DC

## 2020-10-05 MED ORDER — SODIUM CHLORIDE 0.9% FLUSH: 3.0000 mL | INTRAVENOUS | Status: DC | PRN

## 2020-10-05 MED ORDER — HEPARIN SODIUM (PORCINE) 1000 UNIT/ML IJ SOLN
INTRAMUSCULAR | Status: AC
  Filled 2020-10-05: qty 1

## 2020-10-05 MED ORDER — MIDAZOLAM HCL 2 MG/2ML IJ SOLN
INTRAMUSCULAR | Status: AC
  Filled 2020-10-05: qty 2

## 2020-10-05 MED ORDER — SODIUM CHLORIDE 0.9 % IV SOLN: 250.0000 mL | INTRAVENOUS | Status: DC | PRN

## 2020-10-05 MED ORDER — LABETALOL HCL 5 MG/ML IV SOLN: 10.0000 mg | INTRAVENOUS | Status: DC | PRN

## 2020-10-05 MED ORDER — FENTANYL CITRATE (PF) 100 MCG/2ML IJ SOLN
INTRAMUSCULAR | Status: DC | PRN
  Administered 2020-10-05 (×2): 50 ug via INTRAVENOUS

## 2020-10-05 MED ORDER — HEPARIN (PORCINE) IN NACL 2000-0.9 UNIT/L-% IV SOLN
INTRAVENOUS | Status: AC
  Filled 2020-10-05: qty 1000

## 2020-10-05 MED ORDER — LIDOCAINE HCL (PF) 1 % IJ SOLN
INTRAMUSCULAR | Status: AC
  Filled 2020-10-05: qty 30

## 2020-10-05 SURGICAL SUPPLY — 19 items
BALLN MUSTANG 6.0X40 135 (BALLOONS) ×3
BALLOON MUSTANG 6.0X40 135 (BALLOONS) IMPLANT
CATH ANGIO 5F PIGTAIL 65CM (CATHETERS) ×1 IMPLANT
CATH STRAIGHT 5FR 65CM (CATHETERS) ×1 IMPLANT
DEVICE CONTINUOUS FLUSH (MISCELLANEOUS) ×1 IMPLANT
KIT ENCORE 26 ADVANTAGE (KITS) ×1 IMPLANT
KIT MICROPUNCTURE NIT STIFF (SHEATH) ×1 IMPLANT
KIT PV (KITS) ×3 IMPLANT
SHEATH BRITE TIP 7FR 35CM (SHEATH) ×1 IMPLANT
SHEATH PINNACLE 5F 10CM (SHEATH) ×1 IMPLANT
SHEATH PROBE COVER 6X72 (BAG) ×1 IMPLANT
STENT ELUVIA 7X40X130 (Permanent Stent) IMPLANT
STENT INNOVA 7X40X130 (Permanent Stent) ×1 IMPLANT
STENT VIABAHN 7X29X80 VBX (Permanent Stent) ×1 IMPLANT
SYR MEDRAD MARK V 150ML (SYRINGE) ×1 IMPLANT
TRANSDUCER W/STOPCOCK (MISCELLANEOUS) ×3 IMPLANT
TRAY PV CATH (CUSTOM PROCEDURE TRAY) ×3 IMPLANT
WIRE HI TORQ VERSACORE J 260CM (WIRE) ×2 IMPLANT
WIRE STARTER BENTSON 035X150 (WIRE) ×1 IMPLANT

## 2020-10-05 NOTE — Interval H&P Note (Signed)
History and Physical Interval Note:  10/05/2020 8:24 AM  Hannah Mcdaniel  has presented today for surgery, with the diagnosis of pad.  The various methods of treatment have been discussed with the patient and family. After consideration of risks, benefits and other options for treatment, the patient has consented to  Procedure(s): ABDOMINAL AORTOGRAM W/LOWER EXTREMITY (N/A) as a surgical intervention.  The patient's history has been reviewed, patient examined, no change in status, stable for surgery.  I have reviewed the patient's chart and labs.  Questions were answered to the patient's satisfaction.     [name]

## 2020-10-05 NOTE — Progress Notes (Signed)
Site area: rt groin fa sheath Site Prior to Removal:  Level 0 Pressure Applied For: 25 minutes Manual:   yes Patient Status During Pull:  stable Post Pull Site:  Level 0 Post Pull Instructions Given:  yes Post Pull Pulses Present: rt dp dopplered Dressing Applied:  gauze and tegaderm Bedrest begins @ [id number] Comments:

## 2020-10-05 NOTE — Op Note (Signed)
PATIENT: Hannah Mcdaniel      MRN: [id number] DOB: [birth year]    DATE OF PROCEDURE: 10/05/2020  INDICATIONS:    Hannah Mcdaniel is a 55 y.o. female who has had multiple interventions on the right common and external iliac arteries.  Her last intervention was in 2020 when she had a 7 x 28 VBX stent placed in her common iliac artery and a self-expanding 7 x 29 stent placed in the external iliac artery.  She developed a recurrent stenosis in the external iliac artery and also in the common iliac artery.  She presents for arteriography.  PROCEDURE:    Conscious sedation Ultrasound-guided access to the right common femoral artery Aortogram with bilateral iliac arteriogram Angioplasty and stenting of the right common iliac artery (7 x 39 VBX stent) Angioplasty and stenting of the right external iliac artery (7 x 40 Innova self-expanding stent)  Retrograde right femoral arteriogram with right lower extremity runoff  SURGEON: Judeth Cornfield. Scot Dock, MD, FACS  ANESTHESIA: Local with sedation  EBL: Minimal  TECHNIQUE: The patient was brought to the peripheral vascular lab and was sedated. The period of conscious sedation was 81 minutes.  During that time period, I was present face-to-face 100% of the time.  The patient was administered 1 mg of Versed and 50 mcg of fentanyl.. The patient's heart rate, blood pressure, and oxygen saturation were monitored by the nurse continuously during the procedure.  Both groins were prepped and draped in the usual sterile fashion.  Under ultrasound guidance, after the skin was anesthetized, I cannulated the right common femoral artery with a micropuncture needle and a micropuncture sheath was introduced over a wire.  This was exchanged for a 5 [place] sheath over a Bentson wire.  By ultrasound the femoral artery was patent.  Of note the artery was somewhat small and there was not much room between the inguinal ligament and the bifurcation.  A real-time image was  obtained and sent to the server.  The pigtail catheter was positioned at the L1 vertebral body and flush aortogram obtained.  The catheter was in position above the aortic bifurcation and an oblique iliac projection was obtained.  There was a slightly mobile plaque noted in the proximal right common iliac artery at the very top of the stent.  There was also a 80% stenosis in the right external iliac artery below the previously placed stents.  I elected to address these areas with angioplasty and stenting.  I exchanged the 5 [place] sheath for a 7 [place] sheath.  The patient was heparinized and ACT was monitored throughout the procedure.  I initially selected a 7 x 39 VBX stent which was positioned just beyond the top of the stent on the right common iliac artery to encompass the area of disease and the mobile plaque.  This was deployed to nominal pressure.  I then retracted the sheath further to allow angioplasty and stenting of the right external iliac artery stenosis.  I selected an a Innova 7 x 40 stent which was deployed across the stenosis.  Postdilatation was done with a 6 mm x 40 mm balloon.  Completion film showed an excellent result with no stenosis in the common or external iliac arteries.  There was preserved flow into the hypogastric artery.  Next a retrograde right femoral arteriogram was obtained with right lower extremity runoff to the completion of the procedure the patient was transferred to the holding area for removal of the sheath.  No immediate  complications were noted.  FINDINGS:   There are single renal arteries bilaterally with no significant renal artery stenosis identified.  The infrarenal aorta is patent.  There are some slight ectasia of the distal aorta. On the right side, which is the symptomatic side, there was some recurrent stenosis within the right common iliac artery stent which was successfully addressed with angioplasty and stenting as described above with no residual  stenosis.  There was a stenosis in the external iliac artery below the previously placed stent of approximately 80% which was successfully addressed with angioplasty and stenting as described above.  The hypogastric was preserved.  Below that on the right the common femoral, deep femoral, superficial femoral, popliteal, anterior tibial, tibial peroneal trunk, and peroneal arteries are patent.  The posterior tibial artery appears to be occluded. On the left side there is 40% stenosis in the common iliac artery.  The external and hypogastric arteries are patent.  The common femoral, proximal superficial femoral, and deep femoral arteries are patent.  TASC Classification  Largest Sheath Size: 7  Target vessel: Common iliac artery and external iliac artery  % Stenosis: Pre 70%. Post 0% for both lesions.  Lesion length: Right common iliac artery 1-1/2 cm.  Right external iliac artery 2 cm.  Calcification: No  Most impactful devices used (Up to 3): 7 x 39 VBX stent and 7 x 40 Innova stent  Outflow: Disease present or not distal to the lesion treated and the  Flow in the distal vessel: Not  [name], MD, FACS [facility] [place] DICTATION:   10/05/2020

## 2020-10-09 ENCOUNTER — Encounter (HOSPITAL_COMMUNITY): Payer: Self-pay | Admitting: Vascular Surgery

## 2020-11-07 ENCOUNTER — Other Ambulatory Visit: Payer: Self-pay

## 2020-11-07 ENCOUNTER — Encounter: Payer: Self-pay | Admitting: Family Medicine

## 2020-11-07 ENCOUNTER — Ambulatory Visit (INDEPENDENT_AMBULATORY_CARE_PROVIDER_SITE_OTHER): Payer: 59 | Admitting: Family Medicine

## 2020-11-07 VITALS — BP 135/78 | HR 85 | Temp 98.2°F | Ht 61.0 in | Wt 150.2 lb

## 2020-11-07 DIAGNOSIS — Z23 Encounter for immunization: Secondary | ICD-10-CM | POA: Diagnosis not present

## 2020-11-07 DIAGNOSIS — F331 Major depressive disorder, recurrent, moderate: Secondary | ICD-10-CM

## 2020-11-07 DIAGNOSIS — Z0000 Encounter for general adult medical examination without abnormal findings: Secondary | ICD-10-CM

## 2020-11-07 DIAGNOSIS — E782 Mixed hyperlipidemia: Secondary | ICD-10-CM

## 2020-11-07 DIAGNOSIS — E785 Hyperlipidemia, unspecified: Secondary | ICD-10-CM

## 2020-11-07 DIAGNOSIS — E1169 Type 2 diabetes mellitus with other specified complication: Secondary | ICD-10-CM | POA: Diagnosis not present

## 2020-11-07 DIAGNOSIS — J302 Other seasonal allergic rhinitis: Secondary | ICD-10-CM

## 2020-11-07 DIAGNOSIS — F411 Generalized anxiety disorder: Secondary | ICD-10-CM

## 2020-11-07 DIAGNOSIS — Z0001 Encounter for general adult medical examination with abnormal findings: Secondary | ICD-10-CM

## 2020-11-07 LAB — BAYER DCA HB A1C WAIVED: HB A1C (BAYER DCA - WAIVED): 6.3 % — ABNORMAL HIGH (ref 4.8–5.6)

## 2020-11-07 MED ORDER — FLUOXETINE HCL 40 MG PO CAPS: 40.0000 mg | ORAL_CAPSULE | Freq: Every day | ORAL | 1 refills | Status: DC

## 2020-11-07 MED ORDER — ROSUVASTATIN CALCIUM 40 MG PO TABS: 40.0000 mg | ORAL_TABLET | Freq: Every day | ORAL | 1 refills | Status: DC

## 2020-11-07 MED ORDER — MONTELUKAST SODIUM 10 MG PO TABS: 10.0000 mg | ORAL_TABLET | Freq: Every day | ORAL | 1 refills | Status: DC

## 2020-11-07 MED ORDER — EZETIMIBE 10 MG PO TABS: 10.0000 mg | ORAL_TABLET | Freq: Every day | ORAL | 1 refills | Status: DC

## 2020-11-07 MED ORDER — LEVOCETIRIZINE DIHYDROCHLORIDE 5 MG PO TABS: ORAL_TABLET | ORAL | 1 refills | Status: DC

## 2020-11-07 MED ORDER — METFORMIN HCL 500 MG PO TABS: ORAL_TABLET | ORAL | 1 refills | Status: DC

## 2020-11-07 NOTE — Progress Notes (Signed)
Assessment & Plan:  Well adult exam - preventive health education provided - Lipid panel - CBC with Differential/Platelet - CMP14+EGFR  2. DM type 2 with diabetic dyslipidemia ([place]) Lab Results  Component Value Date   HGBA1C 6.3 (H) 11/07/2020   HGBA1C 6.9 08/09/2020   HGBA1C 7.1 (H) 04/10/2020    - Diabetes is at goal of A1c < 7. - Medications: continue current medications - Home glucose monitoring: continue monitoring. Freestyle libre ordered to see if insurance will cover.  - Patient is currently taking a statin. Patient is not taking an ACE-inhibitor/ARB.   Diabetes Health Maintenance Due  Topic Date Due   FOOT EXAM  04/10/2021   OPHTHALMOLOGY EXAM  04/14/2021   HEMOGLOBIN A1C  05/08/2021   URINE MICROALBUMIN  07/04/2021    Lab Results  Component Value Date   LABMICR 8.9 07/04/2020   LABMICR 7.4 06/29/2019   - Lipid panel - CBC with Differential/Platelet - CMP14+EGFR - [facility]ayer DCA Hb A1c Waived - metFORMIN (GLUCOPHAGE) 500 MG tablet; TAKE 2 TABLETS DAILY WITH BREAKFAST AND 1 TABLET DAILY WITH SUPPER.  Dispense: 270 tablet; Refill: 1 - rosuvastatin (CRESTOR) 40 MG tablet; Take 1 tablet (40 mg total) by mouth daily.  Dispense: 90 tablet; Refill: 1  3. Mixed hyperlipidemia - uncontrolled, advised to schedule appointment with clinical pharmacist to evaluate eligibility for Repatha  - did not tolerate Nexletol due to pain in neck and shoulders, and cost - continue fenofibric acid, Zetia, and Crestor - ezetimibe (ZETIA) 10 MG tablet; Take 1 tablet (10 mg total) by mouth daily.  Dispense: 90 tablet; Refill: 1 - rosuvastatin (CRESTOR) 40 MG tablet; Take 1 tablet (40 mg total) by mouth daily.  Dispense: 90 tablet; Refill: 1  4. Major depressive disorder, recurrent episode, moderate (HCC) - Well controlled on current regimen - FLUoxetine (PROZAC) 40 MG capsule; Take 1 capsule (40 mg total) by mouth daily.  Dispense: 90 capsule; Refill: 1  5. Anxiety, generalized - Well  controlled on current regimen - FLUoxetine (PROZAC) 40 MG capsule; Take 1 capsule (40 mg total) by mouth daily.  Dispense: 90 capsule; Refill: 1  6. Seasonal allergies - levocetirizine (XYZAL) 5 MG tablet; TAKE 1 TABLET BY MOUTH EVERY DAY IN THE EVENING  Dispense: 90 tablet; Refill: 1 - montelukast (SINGULAIR) 10 MG tablet; Take 1 tablet (10 mg total) by mouth at bedtime.  Dispense: 90 tablet; Refill: 1  7. Need for immunization against influenza - Flu Vaccine QUAD 76moIM (Fluarix, Fluzone & Alfiuria Quad PF) - given in office  8. Need for vaccination - Pneumococcal conjugate vaccine 20-valent (Prevnar 20) - given in office   Follow-up: Return in about 4 months (around 03/10/2021) for follow-up of chronic medication conditions.   ALucile Crater NP Student  I personally was present during the history, physical exam, and medical decision-making activities of this service and have verified that the service and findings are accurately documented in the nurse practitioner student's note.  BHendricks Limes MSN, APRN, FNP-C [facility]   Subjective:  Patient ID: Hannah Mcdaniel female    DOB: 1Dec 16, 1967 Age: 55y.o. MRN: [id number] Patient Care Team: JLoman Brooklyn FNP as PCP - General (Family Medicine) DAngelia Mould MD as Consulting Physician (Vascular Surgery) LHarlen Labs MD as Referring Physician (Optometry)   CC:  Chief Complaint  Patient presents with   Annual Exam    HPI Hannah LINEBAUGHpresents for annual physical.   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 Marin Comment Last dental exam: never Last colonoscopy: 07/22/2019 Last mammogram: appointment for later this month FRAX: major osteoporotic risk 5.5%, hip  fracture risk 0.4% Hepatitis C Screening: declined Immunizations: Flu Vaccine:  will get today Tdap Vaccine: declined  Shingrix Vaccine: declined  COVID-19 Vaccine: up to date Pneumonia Vaccine: has received one dose of 23, will get 20 today  Diabetes: Patient presents for follow up of diabetes. Current symptoms include: hyperglycemia. Known diabetic complications: cardiovascular disease and peripheral vascular disease. Medication compliance: she takes metformin 1000 mg in the morning and 500 mg in the evening. Current diet: in general, a "healthy" diet  . Current exercise: walking. Home blood sugar records:  postprandial 170's . Is she  on ACE inhibitor or angiotensin II receptor blocker? No. Is she on a statin? Yes. She is interested in the [employer].   Hyperlipidemia: She is on rosuvastatin, zetia, and fenofibric acid. She did not tolerate the Nexletol, stating it caused muscle and joint pain in her shoulder and neck.   DEPRESSION SCREENING Depression screen [facility] 2/9 11/07/2020 07/04/2020 05/16/2020  Decreased Interest 1 0 2  Down, Depressed, Hopeless _0 PHQ - 2 Score _1 Altered sleeping _2 Tired, decreased energy _3 Change in appetite 1 0 0  Feeling bad or failure about yourself  _4 Trouble concentrating 0 0 0  Moving slowly or fidgety/restless 0 0 0  Suicidal thoughts 0 0 0  PHQ-9 Score _5 Difficult doing work/chores Not difficult at all Somewhat difficult Somewhat difficult   GAD 7 : Generalized Anxiety Score 11/07/2020 07/04/2020 05/16/2020 04/10/2020  Nervous, Anxious, on Edge _6 Control/stop worrying _7 Worry too much - different things 1 0 3 2  Trouble relaxing _8 Restless _9 Easily annoyed or irritable _10 Afraid - awful might happen 0 _11 Total GAD 7 Score _12 Anxiety Difficulty Not difficult at all Somewhat difficult Somewhat difficult Very difficult    Review of Systems  Constitutional: Negative.   Negative for chills, fever, malaise/fatigue and weight loss.  HENT:  Positive for sore throat. Negative for congestion, ear discharge, hearing loss and sinus pain.        Ear itchiness and postnasal drip with allergies  Eyes: Negative.  Negative for blurred vision, pain, discharge and redness.  Respiratory: Negative.  Negative for cough, shortness of breath and wheezing.   Cardiovascular: Negative.  Negative for chest pain, palpitations, orthopnea and leg swelling.  Gastrointestinal: Negative.  Negative for abdominal pain, constipation, diarrhea, heartburn, nausea and vomiting.  Genitourinary: Negative.  Negative for dysuria, frequency and urgency.  Musculoskeletal: Negative.  Negative for back pain, falls, joint pain, myalgias and neck pain.  Skin: Negative.  Negative for itching and rash.  Neurological: Negative.  Negative for dizziness, tremors, weakness and headaches.  Endo/Heme/Allergies: Negative.  Negative for environmental allergies and polydipsia. Does not bruise/bleed easily.  Psychiatric/Behavioral: Negative.  Negative for depression, memory loss and substance abuse. The patient is not nervous/anxious and does not have insomnia.     Current  Outpatient Medications:    aspirin EC 81 MG tablet, Take 81 mg by mouth daily., Disp: , Rfl:    buPROPion (WELLBUTRIN XL) 300 MG 24 hr tablet, TAKE 1 TABLET (300 MG TOTAL) BY MOUTH DAILY. TAKE IN ADDITION TO 150 MG FOR A TOTAL OF 450 MG DAILY. (Patient taking differently: Take 300 mg by mouth daily.), Disp: 90 tablet, Rfl: 0   Choline Fenofibrate (FENOFIBRIC ACID) 135 MG CPDR, Take 135 mg by mouth at bedtime. , Disp: , Rfl:    clopidogrel (PLAVIX) 75 MG tablet, Take 1 tablet (75 mg total) by mouth daily., Disp: 30 tablet, Rfl: 11   flunisolide (NASALIDE) 25 MCG/ACT (0.025%) SOLN, PLACE 2 SPRAYS INTO THE NOSE 2 (TWO) TIMES DAILY., Disp: 75 mL, Rfl: 0   meclizine (ANTIVERT) 25 MG tablet, Take 1 tablet (25 mg total) by mouth 3 (three) times daily as  needed for dizziness., Disp: 30 tablet, Rfl: 5   Multiple Vitamin (MULTIVITAMIN WITH MINERALS) TABS tablet, Take 1 tablet by mouth daily., Disp: , Rfl:    SUMAtriptan (IMITREX) 100 MG tablet, Take 1 tablet (100 mg total) by mouth every 2 (two) hours as needed for migraine. May repeat in 2 hours if headache persists or recurs., Disp: 10 tablet, Rfl: 2   triamcinolone cream (KENALOG) 0.1 %, APPLY TO AFFECTED AREA TWICE A DAY (Patient taking differently: Apply 1 application topically 2 (two) times daily as needed (rash).), Disp: 60 g, Rfl: 1   ezetimibe (ZETIA) 10 MG tablet, Take 1 tablet (10 mg total) by mouth daily., Disp: 90 tablet, Rfl: 1   FLUoxetine (PROZAC) 40 MG capsule, Take 1 capsule (40 mg total) by mouth daily., Disp: 90 capsule, Rfl: 1   levocetirizine (XYZAL) 5 MG tablet, TAKE 1 TABLET BY MOUTH EVERY DAY IN THE EVENING, Disp: 90 tablet, Rfl: 1   metFORMIN (GLUCOPHAGE) 500 MG tablet, TAKE 2 TABLETS DAILY WITH BREAKFAST AND 1 TABLET DAILY WITH SUPPER., Disp: 270 tablet, Rfl: 1   montelukast (SINGULAIR) 10 MG tablet, Take 1 tablet (10 mg total) by mouth at bedtime., Disp: 90 tablet, Rfl: 1   rosuvastatin (CRESTOR) 40 MG tablet, Take 1 tablet (40 mg total) by mouth daily., Disp: 90 tablet, Rfl: 1  Allergies  Allergen Reactions   Asa [Aspirin] Rash    Can take low-dose aspirin.   Codeine Rash   Penicillins Rash    Has patient had a PCN reaction causing immediate rash, facial/tongue/throat swelling, SOB or lightheadedness with hypotension:Yes Has patient had a PCN reaction causing severe rash involving mucus membranes or skin necrosis:Yes Has patient had a PCN reaction that required hospitalization:No Has patient had a PCN reaction occurring within the last 10 years:No If all of the above answers are "NO", then may proceed with Cephalosporin use.     Past Medical History:  Diagnosis Date   Anxiety    Arterial occlusive disease Nov. 2014   Arthritis    Claudication of lower  extremity [facility]) Nov. 2014   Right Lower Extremity rest pain   Colon polyps    Depression    Diabetes mellitus without complication ([place])    GERD (gastroesophageal reflux disease)    Hyperlipidemia    Migraines    Vertigo     Past Surgical History:  Procedure Laterality Date   ABDOMINAL AORTAGRAM N/A 01/10/2013   Procedure: ABDOMINAL AORTAGRAM;  Surgeon: Rosetta Posner, MD;  Location: [facility] CATH LAB;  Service: Cardiovascular;  Laterality: N/A;   ABDOMINAL AORTOGRAM W/LOWER EXTREMITY Bilateral 07/16/2018  Procedure: ABDOMINAL AORTOGRAM W/LOWER EXTREMITY;  Surgeon: Angelia Mould, MD;  Location: [place] CV LAB;  Service: Cardiovascular;  Laterality: Bilateral;   ABDOMINAL AORTOGRAM W/LOWER EXTREMITY N/A 10/05/2020   Procedure: ABDOMINAL AORTOGRAM W/LOWER EXTREMITY;  Surgeon: Angelia Mould, MD;  Location: [place] CV LAB;  Service: Cardiovascular;  Laterality: N/A;   CHOLECYSTECTOMY     Gall Bladder   iliac artery angioplasty and stent placement  01/10/13   LOWER EXTREMITY ANGIOGRAM Bilateral 01/10/2013   Procedure: LOWER EXTREMITY ANGIOGRAM;  Surgeon: Rosetta Posner, MD;  Location: [facility] CATH LAB;  Service: Cardiovascular;  Laterality: Bilateral;   PERCUTANEOUS STENT INTERVENTION Right 01/10/2013   Procedure: PERCUTANEOUS STENT INTERVENTION;  Surgeon: Rosetta Posner, MD;  Location: [facility] CATH LAB;  Service: Cardiovascular;  Laterality: Right;  rt common iliac stent   PERIPHERAL VASCULAR CATHETERIZATION N/A 11/26/2015   Procedure: Abdominal Aortogram w/Lower Extremity;  Surgeon: Angelia Mould, MD;  Location: [place] CV LAB;  Service: Cardiovascular;  Laterality: N/A;   PERIPHERAL VASCULAR CATHETERIZATION Right 11/26/2015   Procedure: Peripheral Vascular Balloon Angioplasty;  Surgeon: Angelia Mould, MD;  Location: [place] CV LAB;  Service: Cardiovascular;  Laterality: Right;  rt common iliac   PERIPHERAL VASCULAR INTERVENTION  07/16/2018   Procedure: PERIPHERAL  VASCULAR INTERVENTION;  Surgeon: Angelia Mould, MD;  Location: [place] CV LAB;  Service: Cardiovascular;;   PERIPHERAL VASCULAR INTERVENTION Right 10/05/2020   Procedure: PERIPHERAL VASCULAR INTERVENTION;  Surgeon: Angelia Mould, MD;  Location: [place] CV LAB;  Service: Cardiovascular;  Laterality: Right;  Common and exteral iliac artery   TOTAL ABDOMINAL HYSTERECTOMY      Family History  Problem Relation Age of Onset   Diabetes Mother    Hyperlipidemia Mother    Hypertension Mother    Varicose Veins Mother    COPD Mother    Stroke Mother    Anxiety disorder Mother    Depression Mother    Ovarian cancer Mother    Acute myelogenous leukemia Father    Diabetes Brother    Hypertension Brother    Hyperlipidemia Brother    Stroke Maternal Grandmother    Anxiety disorder Maternal Grandmother    Depression Maternal Grandmother    Breast cancer Maternal Grandmother    Diabetes Maternal Grandmother    Heart disease Maternal Grandfather    Lung cancer Paternal Grandfather    Other Brother        Blood disease   Anxiety disorder Daughter    Migraines Daughter    Migraines Daughter    Migraines Son    Seizures Son     Social History   Socioeconomic History   Marital status: Married    Spouse name: Not on file   Number of children: 3   Years of education: Not on file   Highest education level: Not on file  Occupational History   Not on file  Tobacco Use   Smoking status: Every Day    Packs/day: 0.25    Types: Cigarettes    Start date: 05/16/1977   Smokeless tobacco: Never   Tobacco comments:    1/2 cigarette per day  Vaping Use   Vaping Use: Never used  Substance and Sexual Activity   Alcohol use: Yes    Alcohol/week: 1.0 standard drink    Types: 1 Shots of liquor per week    Comment: occ   Drug use: No   Sexual activity: Yes    Partners: Male    Birth  control/protection: Surgical  Other Topics Concern   Not on file  Social History  Narrative   Not on file   Social Determinants of Health   Financial Resource Strain: Not on file  Food Insecurity: Not on file  Transportation Needs: Not on file  Physical Activity: Not on file  Stress: Not on file  Social Connections: Not on file  Intimate Partner Violence: Not on file      Objective:    BP 135/78   Pulse 85   Temp 98.2 F (36.8 C) (Temporal)   Ht _0  (1.549 m)   Wt 68.1 kg   SpO2 94%   BMI 28.38 kg/m   Wt Readings from Last 3 Encounters:  11/07/20 150 lb 3.2 oz (68.1 kg)  10/05/20 149 lb (67.6 kg)  10/03/20 148 lb (67.1 kg)    Physical Exam Vitals reviewed.  Constitutional:      General: She is not in acute distress.    Appearance: Normal appearance. She is overweight. She is not ill-appearing, toxic-appearing or diaphoretic.  HENT:     Head: Normocephalic and atraumatic.     Right Ear: Tympanic membrane, ear canal and external ear normal.     Left Ear: Tympanic membrane, ear canal and external ear normal.     Nose: Nose normal. No congestion.     Mouth/Throat:     Mouth: Mucous membranes are moist.     Pharynx: Oropharynx is clear. Posterior oropharyngeal erythema present. No oropharyngeal exudate.  Eyes:     Extraocular Movements: Extraocular movements intact.     Conjunctiva/sclera: Conjunctivae normal.     Pupils: Pupils are equal, round, and reactive to light.  Cardiovascular:     Rate and Rhythm: Normal rate and regular rhythm.     Pulses: Normal pulses.     Heart sounds: Normal heart sounds. No murmur heard. Pulmonary:     Effort: Pulmonary effort is normal. No respiratory distress.     Breath sounds: Normal breath sounds. No wheezing or rhonchi.  Abdominal:     General: Bowel sounds are normal. There is no distension.     Palpations: Abdomen is soft. There is no mass.  Musculoskeletal:        General: Normal range of motion.     Cervical back: Normal range of motion.  Skin:    General: Skin is warm and dry.     Capillary  Refill: Capillary refill takes less than 2 seconds.  Neurological:     General: No focal deficit present.     Mental Status: She is alert and oriented to person, place, and time.     Motor: No weakness.     Gait: Gait normal.  Psychiatric:        Mood and Affect: Mood normal.        Behavior: Behavior normal.        Thought Content: Thought content normal.        Judgment: Judgment normal.    No results found for: TSH Lab Results  Component Value Date   WBC 8.7 08/09/2020   HGB 14.6 10/05/2020   HCT 43.0 10/05/2020   MCV 91 08/09/2020   PLT 253 08/09/2020   Lab Results  Component Value Date   NA 137 10/05/2020   K 5.1 10/05/2020   CO2 23 08/09/2020   GLUCOSE 140 (H) 10/05/2020   BUN 16 10/05/2020   CREATININE 0.60 10/05/2020   BILITOT 0.3 08/09/2020   ALKPHOS 143 (H) 08/09/2020  AST 21 08/09/2020   ALT 20 08/09/2020   PROT 6.4 08/09/2020   ALBUMIN 4.5 08/09/2020   CALCIUM 9.8 08/09/2020   EGFR 100 08/09/2020   Lab Results  Component Value Date   CHOL 280 (H) 08/09/2020   Lab Results  Component Value Date   HDL 38 (L) 08/09/2020   Lab Results  Component Value Date   LDLCALC 176 (H) 08/09/2020   Lab Results  Component Value Date   TRIG 339 (H) 08/09/2020   Lab Results  Component Value Date   CHOLHDL 7.4 (H) 08/09/2020   Lab Results  Component Value Date   HGBA1C 6.3 (H) 11/07/2020

## 2020-11-08 ENCOUNTER — Ambulatory Visit (INDEPENDENT_AMBULATORY_CARE_PROVIDER_SITE_OTHER): Payer: 59 | Admitting: Pharmacist

## 2020-11-08 DIAGNOSIS — E785 Hyperlipidemia, unspecified: Secondary | ICD-10-CM

## 2020-11-08 DIAGNOSIS — E1169 Type 2 diabetes mellitus with other specified complication: Secondary | ICD-10-CM | POA: Diagnosis not present

## 2020-11-08 DIAGNOSIS — I70219 Atherosclerosis of native arteries of extremities with intermittent claudication, unspecified extremity: Secondary | ICD-10-CM

## 2020-11-08 LAB — CMP14+EGFR
ALT: 10 IU/L (ref 0–32)
AST: 14 IU/L (ref 0–40)
Albumin/Globulin Ratio: 2 (ref 1.2–2.2)
Albumin: 4.4 g/dL (ref 3.8–4.9)
Alkaline Phosphatase: 123 IU/L — ABNORMAL HIGH (ref 44–121)
BUN/Creatinine Ratio: 19 (ref 9–23)
BUN: 14 mg/dL (ref 6–24)
Bilirubin Total: 0.3 mg/dL (ref 0.0–1.2)
CO2: 23 mmol/L (ref 20–29)
Calcium: 9.1 mg/dL (ref 8.7–10.2)
Chloride: 102 mmol/L (ref 96–106)
Creatinine, Ser: 0.73 mg/dL (ref 0.57–1.00)
Globulin, Total: 2.2 g/dL (ref 1.5–4.5)
Glucose: 161 mg/dL — ABNORMAL HIGH (ref 70–99)
Potassium: 4.2 mmol/L (ref 3.5–5.2)
Sodium: 138 mmol/L (ref 134–144)
Total Protein: 6.6 g/dL (ref 6.0–8.5)
eGFR: 97 mL/min/{1.73_m2} (ref >59)

## 2020-11-08 LAB — CBC WITH DIFFERENTIAL/PLATELET
Basophils Absolute: 0.1 10*3/uL (ref 0.0–0.2)
Basos: 1 %
EOS (ABSOLUTE): 0.1 10*3/uL (ref 0.0–0.4)
Eos: 1 %
Hematocrit: 41.5 % (ref 34.0–46.6)
Hemoglobin: 14.7 g/dL (ref 11.1–15.9)
Immature Grans (Abs): 0 10*3/uL (ref 0.0–0.1)
Immature Granulocytes: 0 %
Lymphocytes Absolute: 4 10*3/uL — ABNORMAL HIGH (ref 0.7–3.1)
Lymphs: 39 %
MCH: 31.9 pg (ref 26.6–33.0)
MCHC: 35.4 g/dL (ref 31.5–35.7)
MCV: 90 fL (ref 79–97)
Monocytes Absolute: 0.5 10*3/uL (ref 0.1–0.9)
Monocytes: 5 %
Neutrophils Absolute: 5.7 10*3/uL (ref 1.4–7.0)
Neutrophils: 54 %
Platelets: 245 10*3/uL (ref 150–450)
RBC: 4.61 x10E6/uL (ref 3.77–5.28)
RDW: 12.4 % (ref 11.7–15.4)
WBC: 10.4 10*3/uL (ref 3.4–10.8)

## 2020-11-08 LAB — LIPID PANEL
Chol/HDL Ratio: 8.5 ratio — ABNORMAL HIGH (ref 0.0–4.4)
Cholesterol, Total: 263 mg/dL — ABNORMAL HIGH (ref 100–199)
HDL: 31 mg/dL — ABNORMAL LOW (ref >39)
LDL Chol Calc (NIH): 154 mg/dL — ABNORMAL HIGH (ref 0–99)
Triglycerides: 409 mg/dL — ABNORMAL HIGH (ref 0–149)
VLDL Cholesterol Cal: 78 mg/dL — ABNORMAL HIGH (ref 5–40)

## 2020-11-08 MED ORDER — REPATHA SURECLICK 140 MG/ML ~~LOC~~ SOAJ: 140.0000 mg | SUBCUTANEOUS | 6 refills | Status: DC

## 2020-11-08 NOTE — Progress Notes (Signed)
11/08/2020 Name: Hannah Mcdaniel MRN: [id number] DOB: Mar 20, 1965  Hannah Mcdaniel is a 55 y.o. female patient referred to lipid clinic by PCP. PMH is significant for Past Medical History:  Diagnosis Date   Anxiety    Arterial occlusive disease Nov. 2014   Arthritis    Claudication of lower extremity ([place]) Nov. 2014   Right Lower Extremity rest pain   Colon polyps    Depression    Diabetes mellitus without complication ([place])    GERD (gastroesophageal reflux disease)    Hyperlipidemia    Migraines    Vertigo    Intolerances: n/a--tolerating statin, zetia--LDL still elevated  Risk Factors: significiatnt family history  LDL goal: <70 T2DM  Diet: recommend Mediterranean; handout given on anti-HLD diet  Exercise: N/A  Family History: significant cardiac  Social History: current 1/2 ppd smoker    Current Outpatient Medications on File Prior to Visit  Medication Sig Dispense Refill   aspirin EC 81 MG tablet Take 81 mg by mouth daily.     buPROPion (WELLBUTRIN XL) 300 MG 24 hr tablet TAKE 1 TABLET (300 MG TOTAL) BY MOUTH DAILY. TAKE IN ADDITION TO 150 MG FOR A TOTAL OF 450 MG DAILY. (Patient taking differently: Take 300 mg by mouth daily.) 90 tablet 0   Choline Fenofibrate (FENOFIBRIC ACID) 135 MG CPDR Take 135 mg by mouth at bedtime.      clopidogrel (PLAVIX) 75 MG tablet Take 1 tablet (75 mg total) by mouth daily. 30 tablet 11   ezetimibe (ZETIA) 10 MG tablet Take 1 tablet (10 mg total) by mouth daily. 90 tablet 1   flunisolide (NASALIDE) 25 MCG/ACT (0.025%) SOLN PLACE 2 SPRAYS INTO THE NOSE 2 (TWO) TIMES DAILY. 75 mL 0   FLUoxetine (PROZAC) 40 MG capsule Take 1 capsule (40 mg total) by mouth daily. 90 capsule 1   levocetirizine (XYZAL) 5 MG tablet TAKE 1 TABLET BY MOUTH EVERY DAY IN THE EVENING 90 tablet 1   meclizine (ANTIVERT) 25 MG tablet Take 1 tablet (25 mg total) by mouth 3 (three) times daily as needed for dizziness. 30 tablet 5   metFORMIN (GLUCOPHAGE) 500 MG  tablet TAKE 2 TABLETS DAILY WITH BREAKFAST AND 1 TABLET DAILY WITH SUPPER. 270 tablet 1   montelukast (SINGULAIR) 10 MG tablet Take 1 tablet (10 mg total) by mouth at bedtime. 90 tablet 1   Multiple Vitamin (MULTIVITAMIN WITH MINERALS) TABS tablet Take 1 tablet by mouth daily.     rosuvastatin (CRESTOR) 40 MG tablet Take 1 tablet (40 mg total) by mouth daily. 90 tablet 1   SUMAtriptan (IMITREX) 100 MG tablet Take 1 tablet (100 mg total) by mouth every 2 (two) hours as needed for migraine. May repeat in 2 hours if headache persists or recurs. 10 tablet 2   triamcinolone cream (KENALOG) 0.1 % APPLY TO AFFECTED AREA TWICE A DAY (Patient taking differently: Apply 1 application topically 2 (two) times daily as needed (rash).) 60 g 1   No current facility-administered medications on file prior to visit.    Lipid Panel     Component Value Date/Time   CHOL 263 (H) 11/07/2020 1508   TRIG 409 (H) 11/07/2020 1508   HDL 31 (L) 11/07/2020 1508   CHOLHDL 8.5 (H) 11/07/2020 1508   LDLCALC 154 (H) 11/07/2020 1508    Clinical Atherosclerotic Cardiovascular Disease (ASCVD): No   The 10-year ASCVD risk score (Arnett DK, et al., 2019) is: 22.3%   Values used to calculate the score:  Age: 49 years     Sex: Female     Is Non-Hispanic African American: No     Diabetic: Yes     Tobacco smoker: Yes     Systolic Blood Pressure: 225 mmHg     Is BP treated: No     HDL Cholesterol: 31 mg/dL     Total Cholesterol: 263 mg/dL  Assessment/Plan:     1. Hyperlipidemia -  CONTINUE ROSUVASTATIN, FENOBRATE AND ZETIA START REPATHA 140mg  sq q14 days   Written patient instructions provided.  Total time in face to face counseling 25 minutes.   Regina Eck, PharmD, BCPS Clinical Pharmacist, [place]  II Phone [phone]

## 2020-11-11 ENCOUNTER — Encounter: Payer: Self-pay | Admitting: Family Medicine

## 2020-11-11 MED ORDER — FREESTYLE LIBRE 2 READER DEVI: 1.0000 | 0 refills | Status: DC

## 2020-11-11 MED ORDER — FREESTYLE LIBRE 2 SENSOR MISC: 1.0000 | 2 refills | Status: DC

## 2020-11-12 ENCOUNTER — Telehealth: Payer: Self-pay | Admitting: Family Medicine

## 2020-11-12 NOTE — Telephone Encounter (Signed)
PA started on patient for Repatha.

## 2020-11-13 NOTE — Telephone Encounter (Signed)
PA sent-please follow Mixed HLD Atheroscelorosis of native (Added both ICDs to problem list)

## 2020-11-19 ENCOUNTER — Other Ambulatory Visit: Payer: Self-pay | Admitting: Family Medicine

## 2020-11-19 DIAGNOSIS — G43001 Migraine without aura, not intractable, with status migrainosus: Secondary | ICD-10-CM

## 2020-11-22 NOTE — Telephone Encounter (Signed)
Can we check on the status of this PA?  Thank you!

## 2020-11-23 NOTE — Telephone Encounter (Signed)
I have received further questions for this PA.  Would like you to look over before faxing.  Can you discuss with me when you are back in office?

## 2020-11-29 NOTE — Telephone Encounter (Signed)
More information needed  Papers faxed

## 2020-12-04 NOTE — Telephone Encounter (Signed)
PA for repatha approved  PA# [id number] [employer] [facility] Approved from 11/30/20 - 11/29/21  Faxed to [facility]

## 2020-12-11 ENCOUNTER — Other Ambulatory Visit: Payer: Self-pay

## 2020-12-11 DIAGNOSIS — I739 Peripheral vascular disease, unspecified: Secondary | ICD-10-CM

## 2020-12-11 DIAGNOSIS — I70219 Atherosclerosis of native arteries of extremities with intermittent claudication, unspecified extremity: Secondary | ICD-10-CM

## 2020-12-12 ENCOUNTER — Ambulatory Visit (INDEPENDENT_AMBULATORY_CARE_PROVIDER_SITE_OTHER): Payer: 59 | Admitting: Family Medicine

## 2020-12-12 ENCOUNTER — Encounter: Payer: Self-pay | Admitting: Family Medicine

## 2020-12-12 DIAGNOSIS — R110 Nausea: Secondary | ICD-10-CM | POA: Diagnosis not present

## 2020-12-12 DIAGNOSIS — Z8619 Personal history of other infectious and parasitic diseases: Secondary | ICD-10-CM | POA: Diagnosis not present

## 2020-12-12 DIAGNOSIS — J0100 Acute maxillary sinusitis, unspecified: Secondary | ICD-10-CM | POA: Diagnosis not present

## 2020-12-12 MED ORDER — FLUCONAZOLE 150 MG PO TABS: 150.0000 mg | ORAL_TABLET | Freq: Once | ORAL | 0 refills | Status: AC

## 2020-12-12 MED ORDER — ONDANSETRON HCL 4 MG PO TABS: 4.0000 mg | ORAL_TABLET | Freq: Three times a day (TID) | ORAL | 0 refills | Status: DC | PRN

## 2020-12-12 MED ORDER — DOXYCYCLINE HYCLATE 100 MG PO TABS: 100.0000 mg | ORAL_TABLET | Freq: Two times a day (BID) | ORAL | 0 refills | Status: AC

## 2020-12-12 NOTE — Progress Notes (Signed)
Virtual Visit via telephone Note Due to COVID-19 pandemic this visit was conducted virtually. This visit type was conducted due to national recommendations for restrictions regarding the COVID-19 Pandemic (e.g. social distancing, sheltering in place) in an effort to limit this patient's exposure and mitigate transmission in our community. All issues noted in this document were discussed and addressed.  A physical exam was not performed with this format.   I connected with Hannah Mcdaniel on 12/12/2020 at 1145 by telephone and verified that I am speaking with the correct person using two identifiers. Hannah Mcdaniel is currently located at home and family is currently with them during visit. The provider, Monia Pouch, FNP is located in their office at time of visit.  I discussed the limitations, risks, security and privacy concerns of performing an evaluation and management service by telephone and the availability of in person appointments. I also discussed with the patient that there may be a patient responsible charge related to this service. The patient expressed understanding and agreed to proceed.  Subjective:  Patient ID: Hannah Mcdaniel, female    DOB: 1965/05/28, 55 y.o.   MRN: [id number]  Chief Complaint:  Sinus Problem   HPI: Hannah Mcdaniel is a 55 y.o. female presenting on 12/12/2020 for Sinus Problem   Patient reports ongoing cough, congestion, postnasal drip, maxillary sinus pressure, and malaise. States she has been symptomatic for about 3 weeks and treating symptoms with DayQuil cold and cough, Flonase, and tylenol without relief of symptoms. States the pressure below and behind her eyes is worse when she bends over. Reports she has so much drainage that it is making her nauseated. Denies known sick contacts.   Sinus Problem This is a recurrent problem. The current episode started 1 to 4 weeks ago. The problem has been gradually worsening since onset. Her pain is at a  severity of 5/10. The pain is moderate. Associated symptoms include congestion, coughing, ear pain, headaches, sinus pressure and a sore throat. Pertinent negatives include no chills, diaphoresis, hoarse voice, neck pain, shortness of breath, sneezing or swollen glands. Past treatments include acetaminophen, spray decongestants and oral decongestants. The treatment provided no relief.    Relevant past medical, surgical, family, and social history reviewed and updated as indicated.  Allergies and medications reviewed and updated.   Past Medical History:  Diagnosis Date   Anxiety    Arterial occlusive disease Nov. 2014   Arthritis    Claudication of lower extremity [facility]) Nov. 2014   Right Lower Extremity rest pain   Colon polyps    Depression    Diabetes mellitus without complication ([place])    GERD (gastroesophageal reflux disease)    Hyperlipidemia    Migraines    Vertigo     Past Surgical History:  Procedure Laterality Date   ABDOMINAL AORTAGRAM N/A 01/10/2013   Procedure: ABDOMINAL AORTAGRAM;  Surgeon: Rosetta Posner, MD;  Location: [facility] CATH LAB;  Service: Cardiovascular;  Laterality: N/A;   ABDOMINAL AORTOGRAM W/LOWER EXTREMITY Bilateral 07/16/2018   Procedure: ABDOMINAL AORTOGRAM W/LOWER EXTREMITY;  Surgeon: Angelia Mould, MD;  Location: [place] CV LAB;  Service: Cardiovascular;  Laterality: Bilateral;   ABDOMINAL AORTOGRAM W/LOWER EXTREMITY N/A 10/05/2020   Procedure: ABDOMINAL AORTOGRAM W/LOWER EXTREMITY;  Surgeon: Angelia Mould, MD;  Location: [place] CV LAB;  Service: Cardiovascular;  Laterality: N/A;   CHOLECYSTECTOMY     Gall Bladder   iliac artery angioplasty and stent placement  01/10/13   LOWER EXTREMITY ANGIOGRAM  Bilateral 01/10/2013   Procedure: LOWER EXTREMITY ANGIOGRAM;  Surgeon: Rosetta Posner, MD;  Location: [facility] CATH LAB;  Service: Cardiovascular;  Laterality: Bilateral;   PERCUTANEOUS STENT INTERVENTION Right 01/10/2013   Procedure: PERCUTANEOUS  STENT INTERVENTION;  Surgeon: Rosetta Posner, MD;  Location: [facility] CATH LAB;  Service: Cardiovascular;  Laterality: Right;  rt common iliac stent   PERIPHERAL VASCULAR CATHETERIZATION N/A 11/26/2015   Procedure: Abdominal Aortogram w/Lower Extremity;  Surgeon: Angelia Mould, MD;  Location: [place] CV LAB;  Service: Cardiovascular;  Laterality: N/A;   PERIPHERAL VASCULAR CATHETERIZATION Right 11/26/2015   Procedure: Peripheral Vascular Balloon Angioplasty;  Surgeon: Angelia Mould, MD;  Location: [facility] CV LAB;  Service: Cardiovascular;  Laterality: Right;  rt common iliac   PERIPHERAL VASCULAR INTERVENTION  07/16/2018   Procedure: PERIPHERAL VASCULAR INTERVENTION;  Surgeon: Angelia Mould, MD;  Location: [place] CV LAB;  Service: Cardiovascular;;   PERIPHERAL VASCULAR INTERVENTION Right 10/05/2020   Procedure: PERIPHERAL VASCULAR INTERVENTION;  Surgeon: Angelia Mould, MD;  Location: [place] CV LAB;  Service: Cardiovascular;  Laterality: Right;  Common and exteral iliac artery   TOTAL ABDOMINAL HYSTERECTOMY      Social History   Socioeconomic History   Marital status: Married    Spouse name: Not on file   Number of children: 3   Years of education: Not on file   Highest education level: Not on file  Occupational History   Not on file  Tobacco Use   Smoking status: Every Day    Packs/day: 0.25    Types: Cigarettes    Start date: 05/16/1977   Smokeless tobacco: Never   Tobacco comments:    1/2 cigarette per day  Vaping Use   Vaping Use: Never used  Substance and Sexual Activity   Alcohol use: Yes    Alcohol/week: 1.0 standard drink    Types: 1 Shots of liquor per week    Comment: occ   Drug use: No   Sexual activity: Yes    Partners: Male    Birth control/protection: Surgical  Other Topics Concern   Not on file  Social History Narrative   Not on file   Social Determinants of Health   Financial Resource Strain: Not on file  Food  Insecurity: Not on file  Transportation Needs: Not on file  Physical Activity: Not on file  Stress: Not on file  Social Connections: Not on file  Intimate Partner Violence: Not on file    Outpatient Encounter Medications as of 12/12/2020  Medication Sig   doxycycline (VIBRA-TABS) 100 MG tablet Take 1 tablet (100 mg total) by mouth 2 (two) times daily for 10 days. 1 po bid   fluconazole (DIFLUCAN) 150 MG tablet Take 1 tablet (150 mg total) by mouth once for 1 dose.   ondansetron (ZOFRAN) 4 MG tablet Take 1 tablet (4 mg total) by mouth every 8 (eight) hours as needed for nausea or vomiting.   aspirin EC 81 MG tablet Take 81 mg by mouth daily.   buPROPion (WELLBUTRIN XL) 300 MG 24 hr tablet TAKE 1 TABLET (300 MG TOTAL) BY MOUTH DAILY. TAKE IN ADDITION TO 150 MG FOR A TOTAL OF 450 MG DAILY. (Patient taking differently: Take 300 mg by mouth daily.)   Choline Fenofibrate (FENOFIBRIC ACID) 135 MG CPDR Take 135 mg by mouth at bedtime.    clopidogrel (PLAVIX) 75 MG tablet Take 1 tablet (75 mg total) by mouth daily.   Continuous Blood Gluc Receiver (FREESTYLE  LIBRE 2 READER) DEVI 1 Device by Does not apply route continuous.   Continuous Blood Gluc Sensor (FREESTYLE LIBRE 2 SENSOR) MISC 1 Device by Does not apply route every 14 (fourteen) days.   Evolocumab (REPATHA SURECLICK) 921 MG/ML SOAJ Inject 140 mg into the skin every 14 (fourteen) days.   ezetimibe (ZETIA) 10 MG tablet Take 1 tablet (10 mg total) by mouth daily.   flunisolide (NASALIDE) 25 MCG/ACT (0.025%) SOLN PLACE 2 SPRAYS INTO THE NOSE 2 (TWO) TIMES DAILY.   FLUoxetine (PROZAC) 40 MG capsule Take 1 capsule (40 mg total) by mouth daily.   levocetirizine (XYZAL) 5 MG tablet TAKE 1 TABLET BY MOUTH EVERY DAY IN THE EVENING   meclizine (ANTIVERT) 25 MG tablet Take 1 tablet (25 mg total) by mouth 3 (three) times daily as needed for dizziness.   metFORMIN (GLUCOPHAGE) 500 MG tablet TAKE 2 TABLETS DAILY WITH BREAKFAST AND 1 TABLET DAILY WITH SUPPER.    montelukast (SINGULAIR) 10 MG tablet Take 1 tablet (10 mg total) by mouth at bedtime.   Multiple Vitamin (MULTIVITAMIN WITH MINERALS) TABS tablet Take 1 tablet by mouth daily.   rosuvastatin (CRESTOR) 40 MG tablet Take 1 tablet (40 mg total) by mouth daily.   SUMAtriptan (IMITREX) 100 MG tablet TAKE 1 TABLET BY MOUTH AS NEEDED FOR MIGRAINE. MAY REPEAT IN 2 HOURS IF HEADACHE PERSISTS OR RECURS.   triamcinolone cream (KENALOG) 0.1 % APPLY TO AFFECTED AREA TWICE A DAY (Patient taking differently: Apply 1 application topically 2 (two) times daily as needed (rash).)   No facility-administered encounter medications on file as of 12/12/2020.    Allergies  Allergen Reactions   Asa [Aspirin] Rash    Can take low-dose aspirin.   Bempedoic Acid Other (See Comments)    Bilateral shoulder pain.   Codeine Rash   Penicillins Rash    Has patient had a PCN reaction causing immediate rash, facial/tongue/throat swelling, SOB or lightheadedness with hypotension:Yes Has patient had a PCN reaction causing severe rash involving mucus membranes or skin necrosis:Yes Has patient had a PCN reaction that required hospitalization:No Has patient had a PCN reaction occurring within the last 10 years:No If all of the above answers are "NO", then may proceed with Cephalosporin use.     Review of Systems  Constitutional:  Positive for activity change and appetite change. Negative for chills, diaphoresis, fatigue, fever and unexpected weight change.  HENT:  Positive for congestion, ear pain, postnasal drip, rhinorrhea, sinus pressure, sinus pain and sore throat. Negative for dental problem, drooling, ear discharge, facial swelling, hearing loss, hoarse voice, mouth sores, nosebleeds, sneezing, tinnitus, trouble swallowing and voice change.   Respiratory:  Positive for cough. Negative for shortness of breath.   Gastrointestinal:  Positive for nausea. Negative for abdominal pain, constipation, diarrhea and vomiting.   Genitourinary:  Negative for decreased urine volume and difficulty urinating.  Musculoskeletal:  Negative for arthralgias, myalgias and neck pain.  Neurological:  Positive for headaches. Negative for dizziness, tremors, seizures, syncope, facial asymmetry, speech difficulty, weakness, light-headedness and numbness.  Psychiatric/Behavioral:  Negative for confusion.   All other systems reviewed and are negative.       Observations/Objective: No vital signs or physical exam, this was a telephone or virtual health encounter.  Pt alert and oriented, answers all questions appropriately, and able to speak in full sentences.    Assessment and Plan: Lina was seen today for sinus problem.  Diagnoses and all orders for this visit:  Acute non-recurrent maxillary sinusitis Symptoms  consistent with maxillary sinusitis.  Symptoms ongoing over 14 days with failed conservative therapy at home.  We will add doxycycline to current regimen of Flonase and Mucinex.  Continue Tylenol as needed for fever and pain control. -     doxycycline (VIBRA-TABS) 100 MG tablet; Take 1 tablet (100 mg total) by mouth 2 (two) times daily for 10 days. 1 po bid  History of candidal vulvovaginitis Does get yeast with antibiotic use, will empirically treat with Diflucan. -     fluconazole (DIFLUCAN) 150 MG tablet; Take 1 tablet (150 mg total) by mouth once for 1 dose.  Nausea in adult Nausea due to postnasal drip and drainage.  Will provide as needed Zofran.  Patient aware to report any new or worsening symptoms. -     ondansetron (ZOFRAN) 4 MG tablet; Take 1 tablet (4 mg total) by mouth every 8 (eight) hours as needed for nausea or vomiting.     Follow Up Instructions: Return if symptoms worsen or fail to improve.    I discussed the assessment and treatment plan with the patient. The patient was provided an opportunity to ask questions and all were answered. The patient agreed with the plan and demonstrated an  understanding of the instructions.   The patient was advised to call back or seek an in-person evaluation if the symptoms worsen or if the condition fails to improve as anticipated.  The above assessment and management plan was discussed with the patient. The patient verbalized understanding of and has agreed to the management plan. Patient is aware to call the clinic if they develop any new symptoms or if symptoms persist or worsen. Patient is aware when to return to the clinic for a follow-up visit. Patient educated on when it is appropriate to go to the emergency department.    I provided 12 minutes of non-face-to-face time during this encounter. The call started at 1145. The call ended at 1155. The other time was used for coordination of care.    Monia Pouch, FNP-C [facility] [address] [place], [place] [address] [phone] 12/12/2020

## 2020-12-28 ENCOUNTER — Other Ambulatory Visit: Payer: Self-pay | Admitting: Family Medicine

## 2020-12-28 DIAGNOSIS — F411 Generalized anxiety disorder: Secondary | ICD-10-CM

## 2020-12-28 DIAGNOSIS — F331 Major depressive disorder, recurrent, moderate: Secondary | ICD-10-CM

## 2021-01-03 ENCOUNTER — Ambulatory Visit (HOSPITAL_COMMUNITY)
Admission: RE | Admit: 2021-01-03 | Discharge: 2021-01-03 | Disposition: A | Discharge status: Home / Self Care | Payer: 59 | Source: Ambulatory Visit | Attending: Vascular Surgery | Admitting: Vascular Surgery

## 2021-01-03 ENCOUNTER — Encounter: Payer: Self-pay | Admitting: Vascular Surgery

## 2021-01-03 ENCOUNTER — Other Ambulatory Visit: Payer: Self-pay

## 2021-01-03 ENCOUNTER — Ambulatory Visit (INDEPENDENT_AMBULATORY_CARE_PROVIDER_SITE_OTHER)
Admission: RE | Admit: 2021-01-03 | Discharge: 2021-01-03 | Disposition: A | Discharge status: Home / Self Care | Payer: 59 | Source: Ambulatory Visit | Attending: Vascular Surgery | Admitting: Vascular Surgery

## 2021-01-03 ENCOUNTER — Ambulatory Visit (INDEPENDENT_AMBULATORY_CARE_PROVIDER_SITE_OTHER): Payer: 59 | Admitting: Vascular Surgery

## 2021-01-03 VITALS — BP 187/82 | HR 80 | Temp 98.4°F | Resp 20 | Ht 61.0 in | Wt 142.0 lb

## 2021-01-03 DIAGNOSIS — I70219 Atherosclerosis of native arteries of extremities with intermittent claudication, unspecified extremity: Secondary | ICD-10-CM

## 2021-01-03 DIAGNOSIS — I739 Peripheral vascular disease, unspecified: Secondary | ICD-10-CM

## 2021-01-03 NOTE — Progress Notes (Signed)
REASON FOR VISIT:   Follow-up after angioplasty and stenting of the right common iliac artery and right external iliac artery  MEDICAL ISSUES:   PERIPHERAL ARTERIAL DISEASE: This patient is doing well after her most recent intervention on the right.  She had angioplasty and stenting of the right common iliac artery and right external iliac artery.  She seems highly motivated now and is cut back to 2 cigarettes a week.  She is walking 3 miles a day.  I encouraged her to continue with this and try to get off the cigarettes completely.  I will plan on seeing her back in 1 year.  I ordered follow-up ABIs and a follow-up aortoiliac duplex at that time.  She knows to call sooner if she has problems.  HPI:   Hannah Mcdaniel is a pleasant 55 y.o. female who has had multiple previous interventions on the right common and external iliac arteries.  She previously had a VBX stent placed in 2020 on the right and also a self-expanding stent in the external iliac artery on the right.  She developed recurrent stenosis.  On 10/05/2020 she underwent an arteriogram which showed some recurrent stenosis in the right common iliac artery stent.  On the left side there was a 40% stenosis in the left common iliac artery.  She underwent angioplasty and stenting of the right common iliac artery with a 7 x 39 VBX stent and also angioplasty and stenting of the right external iliac artery with a 7 x 40 and Novo self-expanding stent.  Since I saw her last, she has cut back to 2 cigarettes a week.  She walks 3 miles a day.  I do not get any history of claudication, or rest pain.  She is on aspirin, Plavix, and a statin.  Past Medical History:  Diagnosis Date   Anxiety    Arterial occlusive disease Nov. 2014   Arthritis    Claudication of lower extremity ([place]) Nov. 2014   Right Lower Extremity rest pain   Colon polyps    Depression    Diabetes mellitus without complication ([place])    GERD (gastroesophageal reflux disease)     Hyperlipidemia    Migraines    Vertigo     Family History  Problem Relation Age of Onset   Diabetes Mother    Hyperlipidemia Mother    Hypertension Mother    Varicose Veins Mother    COPD Mother    Stroke Mother    Anxiety disorder Mother    Depression Mother    Ovarian cancer Mother    Acute myelogenous leukemia Father    Diabetes Brother    Hypertension Brother    Hyperlipidemia Brother    Stroke Maternal Grandmother    Anxiety disorder Maternal Grandmother    Depression Maternal Grandmother    Breast cancer Maternal Grandmother    Diabetes Maternal Grandmother    Heart disease Maternal Grandfather    Lung cancer Paternal Grandfather    Other Brother        Blood disease   Anxiety disorder Daughter    Migraines Daughter    Migraines Daughter    Migraines Son    Seizures Son     SOCIAL HISTORY: Social History   Tobacco Use   Smoking status: Every Day    Packs/day: 0.25    Types: Cigarettes    Start date: 05/16/1977   Smokeless tobacco: Never   Tobacco comments:    1/2 cigarette per day  Substance Use Topics   Alcohol use: Yes    Alcohol/week: 1.0 standard drink    Types: 1 Shots of liquor per week    Comment: occ    Allergies  Allergen Reactions   Asa [Aspirin] Rash    Can take low-dose aspirin.   Bempedoic Acid Other (See Comments)    Bilateral shoulder pain.   Codeine Rash   Penicillins Rash    Has patient had a PCN reaction causing immediate rash, facial/tongue/throat swelling, SOB or lightheadedness with hypotension:Yes Has patient had a PCN reaction causing severe rash involving mucus membranes or skin necrosis:Yes Has patient had a PCN reaction that required hospitalization:No Has patient had a PCN reaction occurring within the last 10 years:No If all of the above answers are "NO", then may proceed with Cephalosporin use.     Current Outpatient Medications  Medication Sig Dispense Refill   aspirin EC 81 MG tablet Take 81 mg by mouth  daily.     buPROPion (WELLBUTRIN XL) 300 MG 24 hr tablet TAKE 1 TABLET (300 MG TOTAL) BY MOUTH DAILY. TAKE IN ADDITION TO 150 MG FOR A TOTAL OF 450 MG DAILY. 90 tablet 0   Choline Fenofibrate (FENOFIBRIC ACID) 135 MG CPDR Take 135 mg by mouth at bedtime.      clopidogrel (PLAVIX) 75 MG tablet Take 1 tablet (75 mg total) by mouth daily. 30 tablet 11   Continuous Blood Gluc Receiver (FREESTYLE LIBRE 2 READER) DEVI 1 Device by Does not apply route continuous. 1 each 0   Continuous Blood Gluc Sensor (FREESTYLE LIBRE 2 SENSOR) MISC 1 Device by Does not apply route every 14 (fourteen) days. 2 each 2   Evolocumab (REPATHA SURECLICK) 791 MG/ML SOAJ Inject 140 mg into the skin every 14 (fourteen) days. 2 mL 6   ezetimibe (ZETIA) 10 MG tablet Take 1 tablet (10 mg total) by mouth daily. 90 tablet 1   flunisolide (NASALIDE) 25 MCG/ACT (0.025%) SOLN PLACE 2 SPRAYS INTO THE NOSE 2 (TWO) TIMES DAILY. 75 mL 0   FLUoxetine (PROZAC) 40 MG capsule Take 1 capsule (40 mg total) by mouth daily. 90 capsule 1   levocetirizine (XYZAL) 5 MG tablet TAKE 1 TABLET BY MOUTH EVERY DAY IN THE EVENING 90 tablet 1   meclizine (ANTIVERT) 25 MG tablet Take 1 tablet (25 mg total) by mouth 3 (three) times daily as needed for dizziness. 30 tablet 5   metFORMIN (GLUCOPHAGE) 500 MG tablet TAKE 2 TABLETS DAILY WITH BREAKFAST AND 1 TABLET DAILY WITH SUPPER. 270 tablet 1   montelukast (SINGULAIR) 10 MG tablet Take 1 tablet (10 mg total) by mouth at bedtime. 90 tablet 1   Multiple Vitamin (MULTIVITAMIN WITH MINERALS) TABS tablet Take 1 tablet by mouth daily.     ondansetron (ZOFRAN) 4 MG tablet Take 1 tablet (4 mg total) by mouth every 8 (eight) hours as needed for nausea or vomiting. 20 tablet 0   rosuvastatin (CRESTOR) 40 MG tablet Take 1 tablet (40 mg total) by mouth daily. 90 tablet 1   SUMAtriptan (IMITREX) 100 MG tablet TAKE 1 TABLET BY MOUTH AS NEEDED FOR MIGRAINE. MAY REPEAT IN 2 HOURS IF HEADACHE PERSISTS OR RECURS. 9 tablet 2    triamcinolone cream (KENALOG) 0.1 % APPLY TO AFFECTED AREA TWICE A DAY (Patient taking differently: Apply 1 application topically 2 (two) times daily as needed (rash).) 60 g 1   No current facility-administered medications for this visit.    REVIEW OF SYSTEMS:  [X]  denotes positive finding, [ ]   denotes negative finding Cardiac  Comments:  Chest pain or chest pressure:    Shortness of breath upon exertion:    Short of breath when lying flat:    Irregular heart rhythm:        Vascular    Pain in calf, thigh, or hip brought on by ambulation:    Pain in feet at night that wakes you up from your sleep:     Blood clot in your veins:    Leg swelling:         Pulmonary    Oxygen at home:    Productive cough:     Wheezing:         Neurologic    Sudden weakness in arms or legs:     Sudden numbness in arms or legs:     Sudden onset of difficulty speaking or slurred speech:    Temporary loss of vision in one eye:     Problems with dizziness:         Gastrointestinal    Blood in stool:     Vomited blood:         Genitourinary    Burning when urinating:     Blood in urine:        Psychiatric    Major depression:         Hematologic    Bleeding problems:    Problems with blood clotting too easily:        Skin    Rashes or ulcers:        Constitutional    Fever or chills:     PHYSICAL EXAM:   Vitals:   01/03/21 0954  BP: (!) 187/82  Pulse: 80  Resp: 20  Temp: 98.4 F (36.9 C)  SpO2: 95%  Weight: 142 lb (64.4 kg)  Height: 5\' 1"  (1.549 m)    GENERAL: The patient is a well-nourished female, in no acute distress. The vital signs are documented above. CARDIAC: There is a regular rate and rhythm.  VASCULAR: I do not detect carotid bruits. On the right side she has a palpable femoral pulse and a palpable posterior tibial pulse. On the left side she has a palpable femoral, dorsalis pedis, and posterior tibial pulse. She has no significant lower extremity  swelling. PULMONARY: There is good air exchange bilaterally without wheezing or rales. ABDOMEN: Soft and non-tender with normal pitched bowel sounds.  MUSCULOSKELETAL: There are no major deformities or cyanosis. NEUROLOGIC: No focal weakness or paresthesias are detected. SKIN: There are no ulcers or rashes noted. PSYCHIATRIC: The patient has a normal affect.  DATA:    ARTERIAL DOPPLER STUDY: I have independently interpreted her arterial Doppler studies today.  On the right side there is a triphasic posterior tibial signal with a monophasic dorsalis pedis signal.  ABI is 100%.  Toe pressures 167 mmHg.  On the left side there is a biphasic posterior tibial signal with a triphasic dorsalis pedis signal.  ABIs 100%.  Toe pressure is 148 mmHg  Duplex Right Common and External Iliac Arteries: There is biphasic flow throughout the stents in the right common and external iliac arteries.  Peak systolic velocity in the mid portion is 255 cm/s suggesting some mild stenosis of approximately 50% here.  [name] [facility] [phone]

## 2021-02-01 ENCOUNTER — Ambulatory Visit (INDEPENDENT_AMBULATORY_CARE_PROVIDER_SITE_OTHER): Payer: 59 | Admitting: Nurse Practitioner

## 2021-02-01 ENCOUNTER — Encounter: Payer: Self-pay | Admitting: Nurse Practitioner

## 2021-02-01 VITALS — BP 189/96 | HR 88 | Temp 98.0°F | Ht 61.0 in | Wt 139.2 lb

## 2021-02-01 DIAGNOSIS — J0100 Acute maxillary sinusitis, unspecified: Secondary | ICD-10-CM | POA: Insufficient documentation

## 2021-02-01 DIAGNOSIS — J069 Acute upper respiratory infection, unspecified: Secondary | ICD-10-CM | POA: Insufficient documentation

## 2021-02-01 DIAGNOSIS — R110 Nausea: Secondary | ICD-10-CM | POA: Insufficient documentation

## 2021-02-01 MED ORDER — LOPERAMIDE HCL 2 MG PO TABS: 2.0000 mg | ORAL_TABLET | Freq: Four times a day (QID) | ORAL | 0 refills | Status: DC | PRN

## 2021-02-01 MED ORDER — AZITHROMYCIN 250 MG PO TABS: ORAL_TABLET | ORAL | 0 refills | Status: AC

## 2021-02-01 MED ORDER — ONDANSETRON HCL 4 MG PO TABS: 4.0000 mg | ORAL_TABLET | Freq: Three times a day (TID) | ORAL | 0 refills | Status: DC | PRN

## 2021-02-01 NOTE — Assessment & Plan Note (Signed)
Zofran 4 mg tablet as needed for nausea

## 2021-02-01 NOTE — Assessment & Plan Note (Signed)
Take meds as prescribed - Use a cool mist humidifier  -Use saline nose sprays frequently -Force fluids -For fever or aches or pains- take Tylenol or ibuprofen. -azithromycin 500 mg day 1, 250 mg day 2-5 At home covid-19 test negative. -If symptoms do not improve, she may need to be COVID tested to rule this out Follow up with worsening unresolved symptoms

## 2021-02-01 NOTE — Patient Instructions (Signed)

## 2021-02-01 NOTE — Progress Notes (Signed)
Acute Office Visit  Subjective:    Patient ID: Hannah Mcdaniel, female    DOB: [birth year], 55 y.o.   MRN: [id number]  Chief Complaint  Patient presents with   Nausea   Sinus Problem   Diarrhea   Ear Pain    Sinus Problem This is a new problem. Episode onset: The past 3 days. The problem has been gradually worsening since onset. There has been no fever. The pain is moderate. Associated symptoms include chills, congestion, coughing, headaches and sinus pressure. Pertinent negatives include no sore throat.  Diarrhea  This is a new problem. The current episode started yesterday. The problem occurs 2 to 4 times per day. The problem has been unchanged. The stool consistency is described as Watery. The patient states that diarrhea does not awaken her from sleep. Associated symptoms include chills, coughing and headaches. Nothing aggravates the symptoms. She has tried nothing for the symptoms.     Past Medical History:  Diagnosis Date   Anxiety    Arterial occlusive disease Nov. 2014   Arthritis    Claudication of lower extremity [facility]) Nov. 2014   Right Lower Extremity rest pain   Colon polyps    Depression    Diabetes mellitus without complication ([place])    GERD (gastroesophageal reflux disease)    Hyperlipidemia    Migraines    Vertigo     Past Surgical History:  Procedure Laterality Date   ABDOMINAL AORTAGRAM N/A 01/10/2013   Procedure: ABDOMINAL AORTAGRAM;  Surgeon: Rosetta Posner, MD;  Location: [facility] CATH LAB;  Service: Cardiovascular;  Laterality: N/A;   ABDOMINAL AORTOGRAM W/LOWER EXTREMITY Bilateral 07/16/2018   Procedure: ABDOMINAL AORTOGRAM W/LOWER EXTREMITY;  Surgeon: Angelia Mould, MD;  Location: [place] CV LAB;  Service: Cardiovascular;  Laterality: Bilateral;   ABDOMINAL AORTOGRAM W/LOWER EXTREMITY N/A 10/05/2020   Procedure: ABDOMINAL AORTOGRAM W/LOWER EXTREMITY;  Surgeon: Angelia Mould, MD;  Location: [place] CV LAB;  Service: Cardiovascular;   Laterality: N/A;   CHOLECYSTECTOMY     Gall Bladder   iliac artery angioplasty and stent placement  01/10/13   LOWER EXTREMITY ANGIOGRAM Bilateral 01/10/2013   Procedure: LOWER EXTREMITY ANGIOGRAM;  Surgeon: Rosetta Posner, MD;  Location: [facility] CATH LAB;  Service: Cardiovascular;  Laterality: Bilateral;   PERCUTANEOUS STENT INTERVENTION Right 01/10/2013   Procedure: PERCUTANEOUS STENT INTERVENTION;  Surgeon: Rosetta Posner, MD;  Location: [facility] CATH LAB;  Service: Cardiovascular;  Laterality: Right;  rt common iliac stent   PERIPHERAL VASCULAR CATHETERIZATION N/A 11/26/2015   Procedure: Abdominal Aortogram w/Lower Extremity;  Surgeon: Angelia Mould, MD;  Location: [place] CV LAB;  Service: Cardiovascular;  Laterality: N/A;   PERIPHERAL VASCULAR CATHETERIZATION Right 11/26/2015   Procedure: Peripheral Vascular Balloon Angioplasty;  Surgeon: Angelia Mould, MD;  Location: [place] CV LAB;  Service: Cardiovascular;  Laterality: Right;  rt common iliac   PERIPHERAL VASCULAR INTERVENTION  07/16/2018   Procedure: PERIPHERAL VASCULAR INTERVENTION;  Surgeon: Angelia Mould, MD;  Location: [place] CV LAB;  Service: Cardiovascular;;   PERIPHERAL VASCULAR INTERVENTION Right 10/05/2020   Procedure: PERIPHERAL VASCULAR INTERVENTION;  Surgeon: Angelia Mould, MD;  Location: [place] CV LAB;  Service: Cardiovascular;  Laterality: Right;  Common and exteral iliac artery   TOTAL ABDOMINAL HYSTERECTOMY      Family History  Problem Relation Age of Onset   Diabetes Mother    Hyperlipidemia Mother    Hypertension Mother    Varicose Veins Mother    COPD  Mother    Stroke Mother    Anxiety disorder Mother    Depression Mother    Ovarian cancer Mother    Acute myelogenous leukemia Father    Diabetes Brother    Hypertension Brother    Hyperlipidemia Brother    Stroke Maternal Grandmother    Anxiety disorder Maternal Grandmother    Depression Maternal Grandmother    Breast  cancer Maternal Grandmother    Diabetes Maternal Grandmother    Heart disease Maternal Grandfather    Lung cancer Paternal Grandfather    Other Brother        Blood disease   Anxiety disorder Daughter    Migraines Daughter    Migraines Daughter    Migraines Son    Seizures Son     Social History   Socioeconomic History   Marital status: Married    Spouse name: Not on file   Number of children: 3   Years of education: Not on file   Highest education level: Not on file  Occupational History   Not on file  Tobacco Use   Smoking status: Every Day    Packs/day: 0.25    Types: Cigarettes    Start date: 05/16/1977   Smokeless tobacco: Never   Tobacco comments:    1/2 cigarette per day  Vaping Use   Vaping Use: Never used  Substance and Sexual Activity   Alcohol use: Yes    Alcohol/week: 1.0 standard drink    Types: 1 Shots of liquor per week    Comment: occ   Drug use: No   Sexual activity: Yes    Partners: Male    Birth control/protection: Surgical  Other Topics Concern   Not on file  Social History Narrative   Not on file   Social Determinants of Health   Financial Resource Strain: Not on file  Food Insecurity: Not on file  Transportation Needs: Not on file  Physical Activity: Not on file  Stress: Not on file  Social Connections: Not on file  Intimate Partner Violence: Not on file    Outpatient Medications Prior to Visit  Medication Sig Dispense Refill   aspirin EC 81 MG tablet Take 81 mg by mouth daily.     buPROPion (WELLBUTRIN XL) 300 MG 24 hr tablet TAKE 1 TABLET (300 MG TOTAL) BY MOUTH DAILY. TAKE IN ADDITION TO 150 MG FOR A TOTAL OF 450 MG DAILY. 90 tablet 0   Choline Fenofibrate (FENOFIBRIC ACID) 135 MG CPDR Take 135 mg by mouth at bedtime.      clopidogrel (PLAVIX) 75 MG tablet Take 1 tablet (75 mg total) by mouth daily. 30 tablet 11   Continuous Blood Gluc Receiver (FREESTYLE LIBRE 2 READER) DEVI 1 Device by Does not apply route continuous. 1 each 0    Continuous Blood Gluc Sensor (FREESTYLE LIBRE 2 SENSOR) MISC 1 Device by Does not apply route every 14 (fourteen) days. 2 each 2   Evolocumab (REPATHA SURECLICK) 696 MG/ML SOAJ Inject 140 mg into the skin every 14 (fourteen) days. 2 mL 6   ezetimibe (ZETIA) 10 MG tablet Take 1 tablet (10 mg total) by mouth daily. 90 tablet 1   flunisolide (NASALIDE) 25 MCG/ACT (0.025%) SOLN PLACE 2 SPRAYS INTO THE NOSE 2 (TWO) TIMES DAILY. 75 mL 0   FLUoxetine (PROZAC) 40 MG capsule Take 1 capsule (40 mg total) by mouth daily. 90 capsule 1   levocetirizine (XYZAL) 5 MG tablet TAKE 1 TABLET BY MOUTH EVERY DAY IN THE  EVENING 90 tablet 1   meclizine (ANTIVERT) 25 MG tablet Take 1 tablet (25 mg total) by mouth 3 (three) times daily as needed for dizziness. 30 tablet 5   metFORMIN (GLUCOPHAGE) 500 MG tablet TAKE 2 TABLETS DAILY WITH BREAKFAST AND 1 TABLET DAILY WITH SUPPER. 270 tablet 1   montelukast (SINGULAIR) 10 MG tablet Take 1 tablet (10 mg total) by mouth at bedtime. 90 tablet 1   Multiple Vitamin (MULTIVITAMIN WITH MINERALS) TABS tablet Take 1 tablet by mouth daily.     rosuvastatin (CRESTOR) 40 MG tablet Take 1 tablet (40 mg total) by mouth daily. 90 tablet 1   SUMAtriptan (IMITREX) 100 MG tablet TAKE 1 TABLET BY MOUTH AS NEEDED FOR MIGRAINE. MAY REPEAT IN 2 HOURS IF HEADACHE PERSISTS OR RECURS. 9 tablet 2   triamcinolone cream (KENALOG) 0.1 % APPLY TO AFFECTED AREA TWICE A DAY (Patient taking differently: Apply 1 application topically 2 (two) times daily as needed (rash).) 60 g 1   ondansetron (ZOFRAN) 4 MG tablet Take 1 tablet (4 mg total) by mouth every 8 (eight) hours as needed for nausea or vomiting. 20 tablet 0   No facility-administered medications prior to visit.    Allergies  Allergen Reactions   Asa [Aspirin] Rash    Can take low-dose aspirin.   Bempedoic Acid Other (See Comments)    Bilateral shoulder pain.   Codeine Rash   Penicillins Rash    Has patient had a PCN reaction causing  immediate rash, facial/tongue/throat swelling, SOB or lightheadedness with hypotension:Yes Has patient had a PCN reaction causing severe rash involving mucus membranes or skin necrosis:Yes Has patient had a PCN reaction that required hospitalization:No Has patient had a PCN reaction occurring within the last 10 years:No If all of the above answers are "NO", then may proceed with Cephalosporin use.     Review of Systems  Constitutional:  Positive for chills.  HENT:  Positive for congestion and sinus pressure. Negative for sore throat.   Respiratory:  Positive for cough.   Gastrointestinal:  Positive for diarrhea.  Neurological:  Positive for headaches.  All other systems reviewed and are negative.     Objective:    Physical Exam Vitals and nursing note reviewed.  Constitutional:      Appearance: Normal appearance.  HENT:     Head: Normocephalic.     Nose: Nose normal.  Eyes:     Conjunctiva/sclera: Conjunctivae normal.  Cardiovascular:     Rate and Rhythm: Normal rate and regular rhythm.     Pulses: Normal pulses.     Heart sounds: Normal heart sounds.  Pulmonary:     Effort: Pulmonary effort is normal.     Breath sounds: Normal breath sounds.  Abdominal:     General: Bowel sounds are normal.     Tenderness: There is abdominal tenderness.  Skin:    General: Skin is warm.  Neurological:     Mental Status: She is alert and oriented to person, place, and time.  Psychiatric:        Behavior: Behavior normal.    BP (!) 189/96    Pulse 88    Temp 98 F (36.7 C)    Ht 5' 1"  (1.549 m)    Wt 139 lb 3.2 oz (63.1 kg)    SpO2 93%    BMI 26.30 kg/m  Wt Readings from Last 3 Encounters:  02/01/21 139 lb 3.2 oz (63.1 kg)  01/03/21 142 lb (64.4 kg)  11/07/20 150 lb 3.2  oz (68.1 kg)    Health Maintenance Due  Topic Date Due   MAMMOGRAM  09/25/2018   COVID-19 Vaccine (3 - Booster for Moderna series) 09/06/2019    There are no preventive care reminders to display for this  patient.   No results found for: TSH Lab Results  Component Value Date   WBC 10.4 11/07/2020   HGB 14.7 11/07/2020   HCT 41.5 11/07/2020   MCV 90 11/07/2020   PLT 245 11/07/2020   Lab Results  Component Value Date   NA 138 11/07/2020   K 4.2 11/07/2020   CO2 23 11/07/2020   GLUCOSE 161 (H) 11/07/2020   BUN 14 11/07/2020   CREATININE 0.73 11/07/2020   BILITOT 0.3 11/07/2020   ALKPHOS 123 (H) 11/07/2020   AST 14 11/07/2020   ALT 10 11/07/2020   PROT 6.6 11/07/2020   ALBUMIN 4.4 11/07/2020   CALCIUM 9.1 11/07/2020   EGFR 97 11/07/2020   Lab Results  Component Value Date   CHOL 263 (H) 11/07/2020   Lab Results  Component Value Date   HDL 31 (L) 11/07/2020   Lab Results  Component Value Date   LDLCALC 154 (H) 11/07/2020   Lab Results  Component Value Date   TRIG 409 (H) 11/07/2020   Lab Results  Component Value Date   CHOLHDL 8.5 (H) 11/07/2020   Lab Results  Component Value Date   HGBA1C 6.3 (H) 11/07/2020       Assessment & Plan:   Problem List Items Addressed This Visit       Respiratory   Acute non-recurrent maxillary sinusitis    Take meds as prescribed - Use a cool mist humidifier  -Use saline nose sprays frequently -Force fluids -For fever or aches or pains- take Tylenol or ibuprofen. -azithromycin 500 mg day 1, 250 mg day 2-5 At home covid-19 test negative. -If symptoms do not improve, she may need to be COVID tested to rule this out Follow up with worsening unresolved symptoms      Relevant Medications   azithromycin (ZITHROMAX) 250 MG tablet     Other   Nausea in adult - Primary    Zofran 4 mg tablet as needed for nausea      Relevant Medications   ondansetron (ZOFRAN) 4 MG tablet     Meds ordered this encounter  Medications   azithromycin (ZITHROMAX) 250 MG tablet    Sig: Take 2 tablets on day 1, then 1 tablet daily on days 2 through 5    Dispense:  6 tablet    Refill:  0    Order Specific Question:   Supervising  Provider    Answer:   Claretta Fraise [[id number]]   ondansetron (ZOFRAN) 4 MG tablet    Sig: Take 1 tablet (4 mg total) by mouth every 8 (eight) hours as needed for nausea or vomiting.    Dispense:  20 tablet    Refill:  0    Order Specific Question:   Supervising Provider    Answer:   Jeneen Rinks   loperamide (IMODIUM A-D) 2 MG tablet    Sig: Take 1 tablet (2 mg total) by mouth 4 (four) times daily as needed for diarrhea or loose stools.    Dispense:  30 tablet    Refill:  0    Order Specific Question:   Supervising Provider    Answer:   Claretta Fraise [[id number]]     Ivy Lynn, NP

## 2021-02-14 ENCOUNTER — Other Ambulatory Visit: Payer: Self-pay

## 2021-02-14 DIAGNOSIS — E1169 Type 2 diabetes mellitus with other specified complication: Secondary | ICD-10-CM

## 2021-02-14 MED ORDER — FREESTYLE LIBRE 2 SENSOR MISC: 1.0000 | 0 refills | Status: DC

## 2021-02-25 ENCOUNTER — Telehealth: Payer: Self-pay | Admitting: *Deleted

## 2021-02-25 DIAGNOSIS — I70219 Atherosclerosis of native arteries of extremities with intermittent claudication, unspecified extremity: Secondary | ICD-10-CM

## 2021-02-25 DIAGNOSIS — E782 Mixed hyperlipidemia: Secondary | ICD-10-CM

## 2021-02-25 NOTE — Telephone Encounter (Signed)
Hannah Mcdaniel (Key: [id number]) Rx #: [id number] Repatha SureClick 140MG /ML auto-injectors Sent to plan

## 2021-02-26 NOTE — Telephone Encounter (Signed)
Yes - new insurance for 02/2021

## 2021-02-26 NOTE — Telephone Encounter (Signed)
This was documented on 12/04/2020: PA for repatha approved  PA# [id number] [employer] Approved from 11/30/20 - 11/29/21  Is it because she has a different insurance? We obviously need to do something different to get it approved.

## 2021-02-26 NOTE — Telephone Encounter (Signed)
Your prior authorization request has been denied. COMPLETE APPEAL Your request for prior authorization was denied

## 2021-02-26 NOTE — Telephone Encounter (Signed)
Hannah Mcdaniel - any suggestions on this?

## 2021-02-27 ENCOUNTER — Other Ambulatory Visit: Payer: Self-pay | Admitting: Family Medicine

## 2021-02-27 NOTE — Telephone Encounter (Signed)
Hannah Mcdaniel (Key: [id number]) Repatha SureClick 140MG /ML auto-injectors  Re-newed PA  Sent to plan with more documentation

## 2021-02-27 NOTE — Telephone Encounter (Signed)
Hannah Mcdaniel (Key: [id number]) Repatha SureClick 140MG /ML auto-injectors   Form [employer] control surveyor Form (CB) Appeal Created 4 minutes ago  Sent to plan

## 2021-03-01 NOTE — Telephone Encounter (Signed)
I called [employer] and discussed PA with pharmacy coverage dept  She states that even though the PA looks cancelled in [facility] - it is still PENDING on their end.  We sent additional info in on 02/27/21 and it is now under Provider Courtesy  Review.  There will be a final answer to coverage on/by 03/06/2021.

## 2021-03-05 MED ORDER — REPATHA SURECLICK 140 MG/ML ~~LOC~~ SOAJ: 140.0000 mg | SUBCUTANEOUS | 6 refills | Status: DC

## 2021-03-05 NOTE — Telephone Encounter (Signed)
PA approved for repatha 02/27/21-02/27/22. Pharmacy and patient aware.

## 2021-03-05 NOTE — Telephone Encounter (Signed)
Refill failed. resent °

## 2021-03-05 NOTE — Addendum Note (Signed)
Addended by: Antonietta Barcelona D on: 03/05/2021 02:06 PM   Modules accepted: Orders

## 2021-03-11 ENCOUNTER — Other Ambulatory Visit: Payer: Self-pay | Admitting: Family Medicine

## 2021-03-11 NOTE — Telephone Encounter (Signed)
I'm confused. It looks like Repatha was just approved after PA last week.

## 2021-03-12 ENCOUNTER — Ambulatory Visit: Payer: 59 | Admitting: Family Medicine

## 2021-03-21 ENCOUNTER — Inpatient Hospital Stay (HOSPITAL_COMMUNITY)
Admission: EM | Admit: 2021-03-21 | Discharge: 2021-03-23 | DRG: 062 | Disposition: A | Discharge status: Home / Self Care | Payer: 59 | Attending: Neurology | Admitting: Neurology

## 2021-03-21 ENCOUNTER — Emergency Department (HOSPITAL_COMMUNITY): Payer: 59

## 2021-03-21 ENCOUNTER — Other Ambulatory Visit: Payer: Self-pay

## 2021-03-21 DIAGNOSIS — K219 Gastro-esophageal reflux disease without esophagitis: Secondary | ICD-10-CM | POA: Diagnosis present

## 2021-03-21 DIAGNOSIS — Z20822 Contact with and (suspected) exposure to covid-19: Secondary | ICD-10-CM | POA: Diagnosis not present

## 2021-03-21 DIAGNOSIS — Z8249 Family history of ischemic heart disease and other diseases of the circulatory system: Secondary | ICD-10-CM | POA: Diagnosis not present

## 2021-03-21 DIAGNOSIS — Z7984 Long term (current) use of oral hypoglycemic drugs: Secondary | ICD-10-CM

## 2021-03-21 DIAGNOSIS — Z823 Family history of stroke: Secondary | ICD-10-CM

## 2021-03-21 DIAGNOSIS — I63511 Cerebral infarction due to unspecified occlusion or stenosis of right middle cerebral artery: Principal | ICD-10-CM | POA: Diagnosis present

## 2021-03-21 DIAGNOSIS — I6389 Other cerebral infarction: Secondary | ICD-10-CM | POA: Diagnosis not present

## 2021-03-21 DIAGNOSIS — Z885 Allergy status to narcotic agent status: Secondary | ICD-10-CM | POA: Diagnosis not present

## 2021-03-21 DIAGNOSIS — Z79899 Other long term (current) drug therapy: Secondary | ICD-10-CM

## 2021-03-21 DIAGNOSIS — E785 Hyperlipidemia, unspecified: Secondary | ICD-10-CM

## 2021-03-21 DIAGNOSIS — Z7982 Long term (current) use of aspirin: Secondary | ICD-10-CM

## 2021-03-21 DIAGNOSIS — Z880 Allergy status to penicillin: Secondary | ICD-10-CM | POA: Diagnosis not present

## 2021-03-21 DIAGNOSIS — Z7902 Long term (current) use of antithrombotics/antiplatelets: Secondary | ICD-10-CM

## 2021-03-21 DIAGNOSIS — Z6826 Body mass index (BMI) 26.0-26.9, adult: Secondary | ICD-10-CM | POA: Diagnosis not present

## 2021-03-21 DIAGNOSIS — E669 Obesity, unspecified: Secondary | ICD-10-CM | POA: Diagnosis present

## 2021-03-21 DIAGNOSIS — I6782 Cerebral ischemia: Secondary | ICD-10-CM | POA: Diagnosis not present

## 2021-03-21 DIAGNOSIS — Z83438 Family history of other disorder of lipoprotein metabolism and other lipidemia: Secondary | ICD-10-CM

## 2021-03-21 DIAGNOSIS — R29701 NIHSS score 1: Secondary | ICD-10-CM | POA: Diagnosis present

## 2021-03-21 DIAGNOSIS — Z833 Family history of diabetes mellitus: Secondary | ICD-10-CM | POA: Diagnosis not present

## 2021-03-21 DIAGNOSIS — R531 Weakness: Secondary | ICD-10-CM | POA: Diagnosis not present

## 2021-03-21 DIAGNOSIS — F17210 Nicotine dependence, cigarettes, uncomplicated: Secondary | ICD-10-CM | POA: Diagnosis present

## 2021-03-21 DIAGNOSIS — G8194 Hemiplegia, unspecified affecting left nondominant side: Secondary | ICD-10-CM | POA: Diagnosis not present

## 2021-03-21 DIAGNOSIS — E1169 Type 2 diabetes mellitus with other specified complication: Secondary | ICD-10-CM | POA: Diagnosis not present

## 2021-03-21 DIAGNOSIS — Z9282 Status post administration of tPA (rtPA) in a different facility within the last 24 hours prior to admission to current facility: Secondary | ICD-10-CM | POA: Diagnosis not present

## 2021-03-21 DIAGNOSIS — R202 Paresthesia of skin: Secondary | ICD-10-CM | POA: Diagnosis not present

## 2021-03-21 DIAGNOSIS — G43909 Migraine, unspecified, not intractable, without status migrainosus: Secondary | ICD-10-CM | POA: Diagnosis not present

## 2021-03-21 DIAGNOSIS — R930 Abnormal findings on diagnostic imaging of skull and head, not elsewhere classified: Secondary | ICD-10-CM | POA: Diagnosis not present

## 2021-03-21 DIAGNOSIS — E782 Mixed hyperlipidemia: Secondary | ICD-10-CM | POA: Diagnosis not present

## 2021-03-21 DIAGNOSIS — Z825 Family history of asthma and other chronic lower respiratory diseases: Secondary | ICD-10-CM | POA: Diagnosis not present

## 2021-03-21 DIAGNOSIS — R2689 Other abnormalities of gait and mobility: Secondary | ICD-10-CM | POA: Diagnosis not present

## 2021-03-21 DIAGNOSIS — Z888 Allergy status to other drugs, medicaments and biological substances status: Secondary | ICD-10-CM | POA: Diagnosis not present

## 2021-03-21 DIAGNOSIS — I639 Cerebral infarction, unspecified: Secondary | ICD-10-CM | POA: Diagnosis present

## 2021-03-21 DIAGNOSIS — I10 Essential (primary) hypertension: Secondary | ICD-10-CM | POA: Diagnosis not present

## 2021-03-21 DIAGNOSIS — Z0389 Encounter for observation for other suspected diseases and conditions ruled out: Secondary | ICD-10-CM | POA: Diagnosis not present

## 2021-03-21 DIAGNOSIS — E1151 Type 2 diabetes mellitus with diabetic peripheral angiopathy without gangrene: Secondary | ICD-10-CM | POA: Diagnosis not present

## 2021-03-21 LAB — I-STAT BETA HCG BLOOD, ED (MC, WL, AP ONLY): I-stat hCG, quantitative: <5 m[IU]/mL (ref <5)

## 2021-03-21 LAB — I-STAT CHEM 8, ED
BUN: 18 mg/dL (ref 6–20)
Calcium, Ion: 1.11 mmol/L — ABNORMAL LOW (ref 1.15–1.40)
Chloride: 100 mmol/L (ref 98–111)
Creatinine, Ser: 0.8 mg/dL (ref 0.44–1.00)
Glucose, Bld: 142 mg/dL — ABNORMAL HIGH (ref 70–99)
HCT: 42 % (ref 36.0–46.0)
Hemoglobin: 14.3 g/dL (ref 12.0–15.0)
Potassium: 3.5 mmol/L (ref 3.5–5.1)
Sodium: 137 mmol/L (ref 135–145)
TCO2: 26 mmol/L (ref 22–32)

## 2021-03-21 LAB — COMPREHENSIVE METABOLIC PANEL
ALT: 10 U/L (ref 0–44)
AST: 16 U/L (ref 15–41)
Albumin: 3.7 g/dL (ref 3.5–5.0)
Alkaline Phosphatase: 96 U/L (ref 38–126)
Anion gap: 10 (ref 5–15)
BUN: 17 mg/dL (ref 6–20)
CO2: 24 mmol/L (ref 22–32)
Calcium: 9.3 mg/dL (ref 8.9–10.3)
Chloride: 101 mmol/L (ref 98–111)
Creatinine, Ser: 0.83 mg/dL (ref 0.44–1.00)
GFR, Estimated: >60 mL/min (ref >60)
Glucose, Bld: 145 mg/dL — ABNORMAL HIGH (ref 70–99)
Potassium: 3.4 mmol/L — ABNORMAL LOW (ref 3.5–5.1)
Sodium: 135 mmol/L (ref 135–145)
Total Bilirubin: 0.4 mg/dL (ref 0.3–1.2)
Total Protein: 6.6 g/dL (ref 6.5–8.1)

## 2021-03-21 LAB — PROTIME-INR
INR: 1 (ref 0.8–1.2)
Prothrombin Time: 12.8 seconds (ref 11.4–15.2)

## 2021-03-21 LAB — ETHANOL: Alcohol, Ethyl (B): <10 mg/dL (ref <10)

## 2021-03-21 LAB — APTT: aPTT: 29 seconds (ref 24–36)

## 2021-03-21 LAB — GLUCOSE, CAPILLARY: Glucose-Capillary: 184 mg/dL — ABNORMAL HIGH (ref 70–99)

## 2021-03-21 MED ORDER — LABETALOL HCL 5 MG/ML IV SOLN
10.0000 mg | Freq: Once | INTRAVENOUS | Status: AC
  Administered 2021-03-21: 10 mg via INTRAVENOUS

## 2021-03-21 MED ORDER — EZETIMIBE 10 MG PO TABS
10.0000 mg | ORAL_TABLET | Freq: Every day | ORAL | Status: DC
  Administered 2021-03-22 – 2021-03-23 (×2): 10 mg via ORAL
  Filled 2021-03-21 (×2): qty 1

## 2021-03-21 MED ORDER — FENOFIBRATE 160 MG PO TABS
160.0000 mg | ORAL_TABLET | Freq: Every day | ORAL | Status: DC
  Administered 2021-03-22: 160 mg via ORAL
  Filled 2021-03-21 (×2): qty 1

## 2021-03-21 MED ORDER — ROSUVASTATIN CALCIUM 20 MG PO TABS
40.0000 mg | ORAL_TABLET | Freq: Every day | ORAL | Status: DC
  Administered 2021-03-22 – 2021-03-23 (×2): 40 mg via ORAL
  Filled 2021-03-21 (×2): qty 2

## 2021-03-21 MED ORDER — SENNOSIDES-DOCUSATE SODIUM 8.6-50 MG PO TABS: 1.0000 | ORAL_TABLET | Freq: Every evening | ORAL | Status: DC | PRN

## 2021-03-21 MED ORDER — ACETAMINOPHEN 325 MG PO TABS
650.0000 mg | ORAL_TABLET | ORAL | Status: DC | PRN
  Administered 2021-03-22: 650 mg via ORAL
  Filled 2021-03-21: qty 2

## 2021-03-21 MED ORDER — STROKE: EARLY STAGES OF RECOVERY BOOK
Freq: Once | Status: AC
  Filled 2021-03-21: qty 1

## 2021-03-21 MED ORDER — TENECTEPLASE FOR STROKE
0.2500 mg/kg | PACK | Freq: Once | INTRAVENOUS | Status: AC
  Administered 2021-03-21: 16 mg via INTRAVENOUS
  Filled 2021-03-21: qty 10

## 2021-03-21 MED ORDER — PANTOPRAZOLE SODIUM 40 MG IV SOLR
40.0000 mg | Freq: Every day | INTRAVENOUS | Status: DC
  Administered 2021-03-22 (×2): 40 mg via INTRAVENOUS
  Filled 2021-03-21 (×2): qty 10

## 2021-03-21 MED ORDER — CHLORHEXIDINE GLUCONATE CLOTH 2 % EX PADS: 6.0000 | MEDICATED_PAD | Freq: Every day | CUTANEOUS | Status: DC

## 2021-03-21 MED ORDER — CLEVIDIPINE BUTYRATE 0.5 MG/ML IV EMUL
0.0000 mg/h | INTRAVENOUS | Status: DC
  Administered 2021-03-21: 1 mg/h via INTRAVENOUS

## 2021-03-21 MED ORDER — ACETAMINOPHEN 160 MG/5ML PO SOLN: 650.0000 mg | ORAL | Status: DC | PRN

## 2021-03-21 MED ORDER — ACETAMINOPHEN 650 MG RE SUPP: 650.0000 mg | RECTAL | Status: DC | PRN

## 2021-03-21 MED ORDER — BUPROPION HCL ER (XL) 300 MG PO TB24
300.0000 mg | ORAL_TABLET | Freq: Every day | ORAL | Status: DC
  Administered 2021-03-22 – 2021-03-23 (×2): 300 mg via ORAL
  Filled 2021-03-21 (×2): qty 1

## 2021-03-21 MED ORDER — IOHEXOL 350 MG/ML SOLN
60.0000 mL | Freq: Once | INTRAVENOUS | Status: AC | PRN
  Administered 2021-03-21: 60 mL via INTRAVENOUS

## 2021-03-21 MED ORDER — ADULT MULTIVITAMIN W/MINERALS CH
1.0000 | ORAL_TABLET | Freq: Every day | ORAL | Status: DC
  Administered 2021-03-22 – 2021-03-23 (×2): 1 via ORAL
  Filled 2021-03-21 (×2): qty 1

## 2021-03-21 MED ORDER — MONTELUKAST SODIUM 10 MG PO TABS
10.0000 mg | ORAL_TABLET | Freq: Every day | ORAL | Status: DC
  Administered 2021-03-22: 10 mg via ORAL
  Filled 2021-03-21 (×4): qty 1

## 2021-03-21 NOTE — Code Documentation (Signed)
Stroke Response Nurse Documentation Code Documentation  KLOE OATES is a 56 y.o. female arriving to [facility] ED via  [facility]  on 2/16 with past medical hx of HLD, DM2, tob. On aspirin 81 mg daily and clopidogrel 75 mg daily. Code stroke was activated by EMS.   Patient from home where she was LKW at 1800 and now complaining of left sided weakness.   Stroke team at the bedside on patient arrival. Labs drawn and patient cleared for CT by Dr. Ronnald Nian. Patient to CT with team. NIHSS 5, see documentation for details and code stroke times. Patient with left leg weakness and left decreased sensation on exam. The following imaging was completed:  CT, CTA head and neck. Patient is a candidate for IV Thrombolytic due to fixed deficit. Patient is not a candidate for IR due to No LVO.   Care/Plan: TNK and q15 min VS/Neuro checks x 2 hours.   Bedside handoff with ED RN Mel Almond.    Madelynn Done  Rapid Response RN

## 2021-03-21 NOTE — ED Triage Notes (Signed)
Pt BIB EMS for stroke like symptoms with a left sided lean, weakness and decrease in sensation in the left leg and arm. Pt fell in the shower and did not hit her head.   BP 210/100

## 2021-03-21 NOTE — Progress Notes (Signed)
PHARMACIST CODE STROKE RESPONSE  Notified to mix TNK at 2150 by Dr. Donnetta Simpers, MD Delivered TNK to RN at 2151  TNK dose = 16 mg IV over 5 seconds  Issues/delays encountered (if applicable): hypertensive requiring IV labetolol  Joseph Art, Pharm.D. PGY-1 Pharmacy Resident [phone] 03/21/2021 9:57 PM

## 2021-03-21 NOTE — H&P (Addendum)
NEUROLOGY CONSULTATION NOTE   Date of service: March 21, 2021 Patient Name: Hannah Mcdaniel MRN:  [id number] DOB:  1965-04-20 _ _ _   _ __   _ __ _ _  __ __   _ __   __ _  History of Present Illness  Hannah Mcdaniel is a 56 y.o. female with PMH significant for diabetes, hyperlipidemia, migraine, GERD, smoker with attempting to cut down, peripheral vascular disease on Plavix who presents with sudden onset left leg weakness and decreased sensation in left arm and left leg.  Patient reports that she was sitting in the bathtub when this started.  EMS arrived and noted that patient had decreased left hand grip strength and was dragging her left leg.  She also has numbness in her entire left arm and her left leg. She denies any pain, did not feel like she pinched a nerve.  No prior history of similar symptoms.  LKW: 1900 on 03/21/21 mRS: 0 tNKASE: yes. I discussed risks and benefits of tNKASE with her husband listening in over the phone. There was some delay in tNKASE administration as she was hypertensive and had to be treated with Labetalol IV. No prior history of ICH or significant GI bleed, no recent surgery, no recent trauma, no recent MI, she is not on anticoagulation, no history of CNS met. Thrombectomy: Not offered due to no LVO.  NIHSS obtained prior to tNKASE administration: NIHSS components Score: Comment  1a Level of Conscious 0[x]  1[]  2[]  3[]      1b LOC Questions 0[x]  1[]  2[]       1c LOC Commands 0[x]  1[]  2[]       2 Best Gaze 0[x]  1[]  2[]       3 Visual 0[x]  1[]  2[]  3[]      4 Facial Palsy 0[x]  1[]  2[]  3[]      5a Motor Arm - left 0[x]  1[]  2[]  3[]  4[]  UN[]    5b Motor Arm - Right 0[x]  1[]  2[]  3[]  4[]  UN[]    6a Motor Leg - Left 0[]  1[]  2[]  3[x]  4[]  UN[]    6b Motor Leg - Right 0[x]  1[]  2[]  3[]  4[]  UN[]    7 Limb Ataxia 0[x]  1[]  2[]  3[]  UN[]     8 Sensory 0[]  1[]  2[x]  UN[]      9 Best Language 0[x]  1[]  2[]  3[]      10 Dysarthria 0[x]  1[]  2[]  UN[]      11 Extinct. and Inattention 0[x]   1[]  2[]       TOTAL: 5     ROS   Constitutional Denies weight loss, fever and chills.   HEENT Denies changes in vision and hearing.   Respiratory Denies SOB and cough.   CV Denies palpitations and CP   GI Denies abdominal pain, nausea, vomiting and diarrhea.   GU Denies dysuria and urinary frequency.   MSK Denies myalgia and joint pain.   Skin Denies rash and pruritus.   Neurological Denies headache and syncope.   Psychiatric Denies recent changes in mood. Denies anxiety and depression.    Past History   Past Medical History:  Diagnosis Date   Anxiety    Arterial occlusive disease Nov. 2014   Arthritis    Claudication of lower extremity ([facility]) Nov. 2014   Right Lower Extremity rest pain   Colon polyps    Depression    Diabetes mellitus without complication (HCC)    GERD (gastroesophageal reflux disease)    Hyperlipidemia    Migraines    Vertigo    Past Surgical History:  Procedure Laterality Date   ABDOMINAL AORTAGRAM N/A 01/10/2013   Procedure: ABDOMINAL AORTAGRAM;  Surgeon: Rosetta Posner, MD;  Location: [facility] CATH LAB;  Service: Cardiovascular;  Laterality: N/A;   ABDOMINAL AORTOGRAM W/LOWER EXTREMITY Bilateral 07/16/2018   Procedure: ABDOMINAL AORTOGRAM W/LOWER EXTREMITY;  Surgeon: Angelia Mould, MD;  Location: [place] CV LAB;  Service: Cardiovascular;  Laterality: Bilateral;   ABDOMINAL AORTOGRAM W/LOWER EXTREMITY N/A 10/05/2020   Procedure: ABDOMINAL AORTOGRAM W/LOWER EXTREMITY;  Surgeon: Angelia Mould, MD;  Location: [place] CV LAB;  Service: Cardiovascular;  Laterality: N/A;   CHOLECYSTECTOMY     Gall Bladder   iliac artery angioplasty and stent placement  01/10/13   LOWER EXTREMITY ANGIOGRAM Bilateral 01/10/2013   Procedure: LOWER EXTREMITY ANGIOGRAM;  Surgeon: Rosetta Posner, MD;  Location: [facility] CATH LAB;  Service: Cardiovascular;  Laterality: Bilateral;   PERCUTANEOUS STENT INTERVENTION Right 01/10/2013   Procedure: PERCUTANEOUS  STENT INTERVENTION;  Surgeon: Rosetta Posner, MD;  Location: [facility] CATH LAB;  Service: Cardiovascular;  Laterality: Right;  rt common iliac stent   PERIPHERAL VASCULAR CATHETERIZATION N/A 11/26/2015   Procedure: Abdominal Aortogram w/Lower Extremity;  Surgeon: Angelia Mould, MD;  Location: [place] CV LAB;  Service: Cardiovascular;  Laterality: N/A;   PERIPHERAL VASCULAR CATHETERIZATION Right 11/26/2015   Procedure: Peripheral Vascular Balloon Angioplasty;  Surgeon: Angelia Mould, MD;  Location: [place] CV LAB;  Service: Cardiovascular;  Laterality: Right;  rt common iliac   PERIPHERAL VASCULAR INTERVENTION  07/16/2018   Procedure: PERIPHERAL VASCULAR INTERVENTION;  Surgeon: Angelia Mould, MD;  Location: [place] CV LAB;  Service: Cardiovascular;;   PERIPHERAL VASCULAR INTERVENTION Right 10/05/2020   Procedure: PERIPHERAL VASCULAR INTERVENTION;  Surgeon: Angelia Mould, MD;  Location: [place] CV LAB;  Service: Cardiovascular;  Laterality: Right;  Common and exteral iliac artery   TOTAL ABDOMINAL HYSTERECTOMY     Family History  Problem Relation Age of Onset   Diabetes Mother    Hyperlipidemia Mother    Hypertension Mother    Varicose Veins Mother    COPD Mother    Stroke Mother    Anxiety disorder Mother    Depression Mother    Ovarian cancer Mother    Acute myelogenous leukemia Father    Diabetes Brother    Hypertension Brother    Hyperlipidemia Brother    Stroke Maternal Grandmother    Anxiety disorder Maternal Grandmother    Depression Maternal Grandmother    Breast cancer Maternal Grandmother    Diabetes Maternal Grandmother    Heart disease Maternal Grandfather    Lung cancer Paternal Grandfather    Other Brother        Blood disease   Anxiety disorder Daughter    Migraines Daughter    Migraines Daughter    Migraines Son    Seizures Son    Social History   Socioeconomic History   Marital status:  Married    Spouse name: Not on file   Number of children: 3   Years of education: Not on file   Highest education level: Not on file  Occupational History   Not on file  Tobacco Use   Smoking status: Every Day    Packs/day: 0.25    Types: Cigarettes    Start date: 05/16/1977   Smokeless tobacco: Never   Tobacco comments:    1/2 cigarette per day  Vaping Use   Vaping Use: Never used  Substance and Sexual Activity   Alcohol use: Yes  Alcohol/week: 1.0 standard drink    Types: 1 Shots of liquor per week    Comment: occ   Drug use: No   Sexual activity: Yes    Partners: Male    Birth control/protection: Surgical  Other Topics Concern   Not on file  Social History Narrative   Not on file   Social Determinants of Health   Financial Resource Strain: Not on file  Food Insecurity: Not on file  Transportation Needs: Not on file  Physical Activity: Not on file  Stress: Not on file  Social Connections: Not on file   Allergies  Allergen Reactions   Asa [Aspirin] Rash    Can take low-dose aspirin.   Bempedoic Acid Other (See Comments)    Bilateral shoulder pain.   Codeine Rash   Penicillins Rash    Has patient had a PCN reaction causing immediate rash, facial/tongue/throat swelling, SOB or lightheadedness with hypotension:Yes Has patient had a PCN reaction causing severe rash involving mucus membranes or skin necrosis:Yes Has patient had a PCN reaction that required hospitalization:No Has patient had a PCN reaction occurring within the last 10 years:No If all of the above answers are "NO", then may proceed with Cephalosporin use.     Medications  (Not in a hospital admission)    Vitals   Vitals:   03/21/21 2240 03/21/21 2245 03/21/21 2300 03/21/21 2305  BP: (!) 107/93 (!) 163/80 (!) 164/115 (!) 179/79  Pulse: 69 67 71 72  Resp: (!) 25 10 (!) 21 (!) 21  Temp:      SpO2: 95% 96% 95% 95%  Weight:         Body mass index is 26.83  kg/m.  Physical Exam   General: Laying comfortably in bed; in no acute distress.  HENT: Normal oropharynx and mucosa. Normal external appearance of ears and nose.  Neck: Supple, no pain or tenderness  CV: No JVD. No peripheral edema.  Pulmonary: Symmetric Chest rise. Normal respiratory effort.  Abdomen: Soft to touch, non-tender.  Ext: No cyanosis, edema, or deformity  Skin: No rash. Normal palpation of skin.   Musculoskeletal: Normal digits and nails by inspection. No clubbing.   Neurologic Examination  Mental status/Cognition: Alert, oriented to self, place, month and year, good attention.  Speech/language: Fluent, comprehension intact, object naming intact, repetition intact.  Cranial nerves:   CN II Pupils equal and reactive to light, no VF deficits    CN III,IV,VI EOM intact, no gaze preference or deviation, no nystagmus    CN V normal sensation in V1, V2, and V3 segments bilaterally    CN VII no asymmetry, no nasolabial fold flattening    CN VIII normal hearing to speech    CN IX & X normal palatal elevation, no uvular deviation    CN XI 5/5 head turn and 5/5 shoulder shrug bilaterally    CN XII midline tongue protrusion    Motor:  Muscle bulk: normal, tone flaccid in LLE Mvmt Root Nerve  Muscle Right Left Comments  SA C5/6 Ax Deltoid 5    EF C5/6 Mc Biceps 5 4   EE C6/7/8 Rad Triceps 5 4   WF C6/7 Med FCR     WE C7/8 PIN ECU     F Ab C8/T1 U ADM/FDI 5 4   HF L1/2/3 Fem Illopsoas 5 2   KE L2/3/4 Fem Quad 5 2   DF L4/5 D Peron Tib Ant 5 3   PF S1/2 Tibial Grc/Sol 5 3  Reflexes:  Right Left Comments  Pectoralis      Biceps (C5/6) 2 2   Brachioradialis (C5/6) 2 2    Triceps (C6/7) 1 1    Patellar (L3/4) 1 1    Achilles (S1) 1 1    Hoffman      Plantar mute mute   Jaw jerk    Sensation:  Light touch Decreased to touch in entire L arm and L leg   Pin prick    Temperature    Vibration   Proprioception    Coordination/Complex Motor:  - Finger to Nose  intact BL - Heel to shin unable to do - Rapid alternating movement are slowed in LUE - Gait: Deferred for patient safety.  Labs   CBC:  Recent Labs  Lab 03/21/21 2148  HGB 14.3  HCT 69.7    Basic Metabolic Panel:  Lab Results  Component Value Date   NA 137 03/21/2021   K 3.5 03/21/2021   CO2 24 03/21/2021   GLUCOSE 142 (H) 03/21/2021   BUN 18 03/21/2021   CREATININE 0.80 03/21/2021   CALCIUM 9.3 03/21/2021   GFRNONAA >60 03/21/2021   GFRAA 112 06/29/2019   Lipid Panel:  Lab Results  Component Value Date   LDLCALC 154 (H) 11/07/2020   HgbA1c:  Lab Results  Component Value Date   HGBA1C 6.3 (H) 11/07/2020   Urine Drug Screen: No results found for: LABOPIA, COCAINSCRNUR, LABBENZ, AMPHETMU, THCU, LABBARB  Alcohol Level     Component Value Date/Time   ETH <10 03/21/2021 2132    CT Head without contrast(Personally reviewed): CTH was negative for a large hypodensity concerning for a large territory infarct or hyperdensity concerning for an ICH. Concern for early R MCA small stroke. ASPECTs of 9. She also has significant ventriculomegaly involving both laterals and third ventricle. MRI will be able to clarify this better.  CT angio Head and Neck with contrast(Personally reviewed): No LVO on my review.  MRI Brain: Pending  Impression   SHAHANA CAPES is a 56 y.o. female with PMH significant for diabetes, hyperlipidemia, migraine, GERD, smoker with attempting to cut down, peripheral vascular disease on Plavix who presents with sudden onset left leg weakness and decreased sensation in left arm and left leg. Symptoms felt to be concerning for a stroke and she was given tNKASE with marked improvement in her symptoms. Post tNKASE NIHSS was 1. She did not have an LVO on CT Angio and thus not a candidate for thrombectomy.  Primary Diagnosis:  Concern for R MCA stroke:  Secondary Diagnosis: Essential (primary) hypertension, Type 2 diabetes mellitus w/o complications,  and Obesity  Recommendations   Probable R MCA stroke s/p tNKASE: Plan: - Frequent NeuroChecks for post tNK care per stroke unit protocol: - Initial CTH demonstrated no acute hemorrhage or mass - MRI Brain - pending. - CTA - no LVO - TTE - pending - Lipid Panel: LDL - pending  - Statin: continue home rosuvastatin and Ezetimibe for now. - HbA1c: pending. - Antithrombotic: Resume home Plavix 85m daily if 24 h CTH does not show acute hemorrhage - DVT prophylaxis: SCDs. Pharmacologic prophylaxis if 24 h CTH does not demonstrate acute hemorrhage - Smoking cessation: encouraged her to continue cutting down on smoking. She is down to about 1 pack a week. - Systolic Blood Pressure goal: < 180 mm Hg - Telemetry monitoring for arrhythmia: 72 hours - Swallow screen - ordered - PT/OT/SLP consults  Hypertension: We will hold home medication, as BP  goal is above.  We will use Cleviprex as needed for blood pressure control in the setting of tNKASE.  Diabetes: - Sliding scale insulin with carb modified diet.  Hyperlipidemia: - LDL pending - continue home Rosuvastatin and Zetia.  Smoking: - nicotine patch and continue home Wellbutrin - encouraged her to continue to cut down smoking.  Migraine headaches: - Discussed with patient that she should not take Sumatriptan going forward given concern for stroke.  ______________________________________________________________________  This patient is critically ill and at significant risk of neurological worsening, death and care requires constant monitoring of vital signs, hemodynamics,respiratory and cardiac monitoring, neurological assessment, discussion with family, other specialists and medical decision making of high complexity. I spent 60 minutes of neurocritical care time  in the care of  this patient. This was time spent independent of any time provided by nurse practitioner or PA.  Donnetta Simpers [facility] Pager Number  [phone] 03/21/2021  11:37 PM   Thank you for the opportunity to take part in the care of this patient. If you have any further questions, please contact the neurology consultation attending.  Signed,  Heil Pager Number [phone] _ _ _   _ __   _ __ _ _  __ __   _ __   __ _

## 2021-03-21 NOTE — ED Provider Notes (Addendum)
[facility] EMERGENCY DEPARTMENT Provider Note   CSN: [id number] Arrival date & time: 03/21/21  2130  An emergency department physician performed an initial assessment on this suspected stroke patient at 2131.  History  Chief Complaint  Patient presents with   Code Stroke    Hannah Mcdaniel is a 56 y.o. female.  Patient arrives as a code stroke with left-sided weakness, decreased sensation in the left leg and arm.  Patient possibly fell in the shower.  Did not hit her head.  Blood pressure 210/100 with EMS.  Last known normal around 7:30 PM.  History of diabetes.  Not on blood thinners.  No obvious facial weakness, speech problems.  Symptoms just occurred prior to arrival here.  Denies any chest pain or shortness of breath.  The history is provided by the patient, a caregiver and the EMS personnel.      Home Medications Prior to Admission medications   Medication Sig Start Date End Date Taking? Authorizing Provider  aspirin EC 81 MG tablet Take 81 mg by mouth daily.   Yes [provider]  Blood Glucose Monitoring Suppl KIT by Does not apply route daily.   Yes [provider]  Choline Fenofibrate (FENOFIBRIC ACID) 135 MG CPDR Take 135 mg by mouth at bedtime.  11/20/15  Yes [provider]  clopidogrel (PLAVIX) 75 MG tablet Take 1 tablet (75 mg total) by mouth daily. 06/01/20  Yes Collins, Emma M, PA-C  Evolocumab (REPATHA SURECLICK) 540 MG/ML SOAJ Inject 140 mg into the skin every 14 (fourteen) days. 03/05/21  Yes Hendricks Limes F, FNP  ezetimibe (ZETIA) 10 MG tablet Take 1 tablet (10 mg total) by mouth daily. 11/07/20  Yes Hendricks Limes F, FNP  FLUoxetine (PROZAC) 40 MG capsule Take 1 capsule (40 mg total) by mouth daily. 11/07/20  Yes Hendricks Limes F, FNP  levocetirizine (XYZAL) 5 MG tablet TAKE 1 TABLET BY MOUTH EVERY DAY IN THE EVENING Patient taking differently: Take 5 mg by mouth every evening. 11/07/20  Yes Hendricks Limes F, FNP   loperamide (IMODIUM A-D) 2 MG tablet Take 1 tablet (2 mg total) by mouth 4 (four) times daily as needed for diarrhea or loose stools. 02/01/21  Yes Ivy Lynn, NP  meclizine (ANTIVERT) 25 MG tablet Take 1 tablet (25 mg total) by mouth 3 (three) times daily as needed for dizziness. 04/10/20  Yes Hendricks Limes F, FNP  metFORMIN (GLUCOPHAGE) 500 MG tablet TAKE 2 TABLETS DAILY WITH BREAKFAST AND 1 TABLET DAILY WITH SUPPER. Patient taking differently: Take 500-1,000 mg by mouth See admin instructions. 1000 mg in the morning 500 mg at dinner 11/07/20  Yes Hendricks Limes F, FNP  montelukast (SINGULAIR) 10 MG tablet Take 1 tablet (10 mg total) by mouth at bedtime. 11/07/20  Yes Hendricks Limes F, FNP  Multiple Vitamin (MULTIVITAMIN WITH MINERALS) TABS tablet Take 1 tablet by mouth daily.   Yes [provider]  ondansetron (ZOFRAN) 4 MG tablet Take 1 tablet (4 mg total) by mouth every 8 (eight) hours as needed for nausea or vomiting. 02/01/21  Yes Ivy Lynn, NP  rosuvastatin (CRESTOR) 40 MG tablet Take 1 tablet (40 mg total) by mouth daily. 11/07/20  Yes Hendricks Limes F, FNP  SUMAtriptan (IMITREX) 100 MG tablet TAKE 1 TABLET BY MOUTH AS NEEDED FOR MIGRAINE. MAY REPEAT IN 2 HOURS IF HEADACHE PERSISTS OR RECURS. Patient taking differently: Take 100 mg by mouth every 2 (two) hours as needed for migraine. 11/19/20  Yes Hendricks Limes F, FNP  triamcinolone cream (KENALOG) 0.1 % APPLY TO AFFECTED AREA TWICE A DAY Patient taking differently: Apply 1 application topically 2 (two) times daily as needed (rash). 08/28/20  Yes Hendricks Limes F, FNP  buPROPion (WELLBUTRIN XL) 300 MG 24 hr tablet TAKE 1 TABLET (300 MG TOTAL) BY MOUTH DAILY. TAKE IN ADDITION TO 150 MG FOR A TOTAL OF 450 MG DAILY. Patient not taking: Reported on 03/21/2021 12/29/20   [name], FNP  Continuous Blood Gluc Receiver (FREESTYLE LIBRE 2 READER) DEVI 1 Device by Does not apply route continuous. 11/11/20   [name],  FNP  Continuous Blood Gluc Sensor (FREESTYLE LIBRE 2 SENSOR) MISC 1 Device by Does not apply route every 14 (fourteen) days. 02/14/21   [name], FNP  flunisolide (NASALIDE) 25 MCG/ACT (0.025%) SOLN PLACE 2 SPRAYS INTO THE NOSE 2 (TWO) TIMES DAILY. Patient not taking: Reported on 03/21/2021 08/28/20   [name], FNP      Allergies    Asa [aspirin], Bempedoic acid, Codeine, and Penicillins    Review of Systems   Review of Systems  Physical Exam Updated Vital Signs BP (!) 179/79    Pulse 72    Temp 97.8 F (36.6 C)    Resp (!) 21    Wt 64.4 kg    SpO2 95%    BMI 26.83 kg/m  Physical Exam Vitals and nursing note reviewed.  Constitutional:      General: She is not in acute distress.    Appearance: She is well-developed.  HENT:     Head: Normocephalic and atraumatic.     Nose: Nose normal.     Mouth/Throat:     Mouth: Mucous membranes are moist.  Eyes:     Extraocular Movements: Extraocular movements intact.     Conjunctiva/sclera: Conjunctivae normal.     Pupils: Pupils are equal, round, and reactive to light.  Cardiovascular:     Rate and Rhythm: Normal rate and regular rhythm.     Pulses: Normal pulses.     Heart sounds: Normal heart sounds. No murmur heard. Pulmonary:     Effort: Pulmonary effort is normal. No respiratory distress.     Breath sounds: Normal breath sounds.  Abdominal:     Palpations: Abdomen is soft.     Tenderness: There is no abdominal tenderness.  Musculoskeletal:        General: No swelling.     Cervical back: Neck supple.  Skin:    General: Skin is warm and dry.     Capillary Refill: Capillary refill takes less than 2 seconds.  Neurological:     Mental Status: She is alert.     Sensory: Sensory deficit present.     Motor: Weakness present.     Comments: Left-sided weakness with decreased sensation of the left side, cranial nerves appear intact, no obvious facial droop, no obvious speech issues  Psychiatric:        Mood and Affect:  Mood normal.    ED Results / Procedures / Treatments   Labs (all labs ordered are listed, but only abnormal results are displayed) Labs Reviewed  COMPREHENSIVE METABOLIC PANEL - Abnormal; Notable for the following components:      Result Value   Potassium 3.4 (*)    Glucose, Bld 145 (*)    All other components within normal limits  GLUCOSE, CAPILLARY - Abnormal; Notable for the following components:   Glucose-Capillary 184 (*)    All other  components within normal limits  I-STAT CHEM 8, ED - Abnormal; Notable for the following components:   Glucose, Bld 142 (*)    Calcium, Ion 1.11 (*)    All other components within normal limits  RESP PANEL BY RT-PCR (FLU A&B, COVID) ARPGX2  MRSA NEXT GEN BY PCR, NASAL  ETHANOL  PROTIME-INR  APTT  CBC  DIFFERENTIAL  RAPID URINE DRUG SCREEN, HOSP PERFORMED  URINALYSIS, ROUTINE W REFLEX MICROSCOPIC  HIV ANTIBODY (ROUTINE TESTING W REFLEX)  HEMOGLOBIN A1C  LIPID PANEL  I-STAT BETA HCG BLOOD, ED ([facility], [facility], [facility] ONLY)    EKG EKG Interpretation  Date/Time:  Thursday March 21 2021 22:26:32 EST Ventricular Rate:  68 PR Interval:  182 QRS Duration: 98 QT Interval:  423 QTC Calculation: 450 R Axis:   71 Text Interpretation: Sinus rhythm Anterior infarct, old Nonspecific T abnormalities, inferior leads Confirmed by Lennice Sites [phone]) on 03/22/2021 12:00:42 AM  Radiology CT HEAD CODE STROKE WO CONTRAST  Result Date: 03/21/2021 CLINICAL DATA:  Code stroke.  Acute neurologic deficit EXAM: CT HEAD WITHOUT CONTRAST TECHNIQUE: Contiguous axial images were obtained from the base of the skull through the vertex without intravenous contrast. RADIATION DOSE REDUCTION: This exam was performed according to the departmental dose-optimization program which includes automated exposure control, adjustment of the mA and/or kV according to patient size and/or use of iterative reconstruction technique. COMPARISON:  None. FINDINGS: Brain: There is no mass,  hemorrhage or extra-axial collection. Markedly enlarged lateral and third ventricles without a clear source of obstruction. There is cortical hypoattenuation in the right MCA territory. Vascular: No abnormal hyperdensity of the major intracranial arteries or dural venous sinuses. No intracranial atherosclerosis. Skull: The visualized skull base, calvarium and extracranial soft tissues are normal. Sinuses/Orbits: No fluid levels or advanced mucosal thickening of the visualized paranasal sinuses. No mastoid or middle ear effusion. The orbits are normal. ASPECTS [facility] Stroke Program Early CT Score) - Ganglionic level infarction (caudate, lentiform nuclei, internal capsule, insula, M1-M3 cortex): 7 - Supraganglionic infarction (M4-M6 cortex): 2 Total score (0-10 with 10 being normal): 9 IMPRESSION: 1. Early changes of likely right MCA small acute infarct. 2. Markedly enlarged lateral and third ventricles without a clear source of obstruction. This could indicate aqua ductal stenosis. 3. ASPECTS is 9. These results were called by telephone at the time of interpretation on 03/21/2021 at 9:47 pm to provider [facility], who verbally acknowledged these results. Electronically Signed   By: Ulyses Jarred M.D.   On: 03/21/2021 21:48   CT ANGIO HEAD NECK W WO CM (CODE STROKE)  Result Date: 03/21/2021 CLINICAL DATA:  Code stroke EXAM: CT ANGIOGRAPHY HEAD AND NECK TECHNIQUE: Multidetector CT imaging of the head and neck was performed using the standard protocol during bolus administration of intravenous contrast. Multiplanar CT image reconstructions and MIPs were obtained to evaluate the vascular anatomy. Carotid stenosis measurements (when applicable) are obtained utilizing NASCET criteria, using the distal internal carotid diameter as the denominator. RADIATION DOSE REDUCTION: This exam was performed according to the departmental dose-optimization program which includes automated exposure control, adjustment of the  mA and/or kV according to patient size and/or use of iterative reconstruction technique. CONTRAST:  84m OMNIPAQUE IOHEXOL 350 MG/ML SOLN COMPARISON:  None. FINDINGS: CTA NECK FINDINGS SKELETON: There is no bony spinal canal stenosis. No lytic or blastic lesion. OTHER NECK: Normal pharynx, larynx and major salivary glands. No cervical lymphadenopathy. Unremarkable thyroid gland. UPPER CHEST: No pneumothorax or pleural effusion. No nodules or masses. AORTIC ARCH:  There is calcific atherosclerosis of the aortic arch. There is no aneurysm, dissection or hemodynamically significant stenosis of the visualized portion of the aorta. Conventional 3 vessel aortic branching pattern. The visualized proximal subclavian arteries are widely patent. RIGHT CAROTID SYSTEM: Normal without aneurysm, dissection or stenosis. LEFT CAROTID SYSTEM: Normal without aneurysm, dissection or stenosis. VERTEBRAL ARTERIES: Left dominant configuration. Both origins are clearly patent. There is no dissection, occlusion or flow-limiting stenosis to the skull base (V1-V3 segments). CTA HEAD FINDINGS POSTERIOR CIRCULATION: --Vertebral arteries: Normal V4 segments. --Inferior cerebellar arteries: Normal. --Basilar artery: Normal. --Superior cerebellar arteries: Normal. --Posterior cerebral arteries (PCA): Normal. ANTERIOR CIRCULATION: --Intracranial internal carotid arteries: Normal. --Anterior cerebral arteries (ACA): Normal. Both A1 segments are present. Patent anterior communicating artery (a-comm). --Middle cerebral arteries (MCA): Normal. VENOUS SINUSES: As permitted by contrast timing, patent. ANATOMIC VARIANTS: None Review of the MIP images confirms the above findings. IMPRESSION: 1. No emergent large vessel occlusion or high-grade stenosis of the intracranial arteries. Aortic Atherosclerosis (ICD10-I70.0). Electronically Signed   By: Ulyses Jarred M.D.   On: 03/21/2021 22:27    Procedures .Critical Care Performed by: Lennice Sites,  DO Authorized by: Lennice Sites, DO   Critical care provider statement:    Critical care time (minutes):  35   Critical care was necessary to treat or prevent imminent or life-threatening deterioration of the following conditions:  CNS failure or compromise   Critical care was time spent personally by me on the following activities:  Blood draw for specimens, development of treatment plan with patient or surrogate, discussions with primary provider, evaluation of patient's response to treatment, examination of patient, obtaining history from patient or surrogate, ordering and performing treatments and interventions, ordering and review of laboratory studies, ordering and review of radiographic studies, pulse oximetry, re-evaluation of patient's condition and review of old charts   Care discussed with: admitting provider      Medications Ordered in ED Medications  clevidipine (CLEVIPREX) infusion 0.5 mg/mL (1 mg/hr Intravenous Restarted 03/21/21 2316)   stroke: mapping our early stages of recovery book (has no administration in time range)  acetaminophen (TYLENOL) tablet 650 mg (has no administration in time range)    Or  acetaminophen (TYLENOL) 160 MG/5ML solution 650 mg (has no administration in time range)    Or  acetaminophen (TYLENOL) suppository 650 mg (has no administration in time range)  senna-docusate (Senokot-S) tablet 1 tablet (has no administration in time range)  pantoprazole (PROTONIX) injection 40 mg (has no administration in time range)  buPROPion (WELLBUTRIN XL) 24 hr tablet 300 mg (has no administration in time range)  fenofibrate tablet 160 mg (has no administration in time range)  ezetimibe (ZETIA) tablet 10 mg (has no administration in time range)  montelukast (SINGULAIR) tablet 10 mg (has no administration in time range)  multivitamin with minerals tablet 1 tablet (has no administration in time range)  rosuvastatin (CRESTOR) tablet 40 mg (has no administration in time  range)  Chlorhexidine Gluconate Cloth 2 % PADS 6 each (has no administration in time range)  tenecteplase (TNKASE) injection for Stroke 16 mg (16 mg Intravenous Given 03/21/21 2156)  iohexol (OMNIPAQUE) 350 MG/ML injection 60 mL (60 mLs Intravenous Contrast Given 03/21/21 2208)  labetalol (NORMODYNE) injection 10 mg (10 mg Intravenous Given 03/21/21 2154)  labetalol (NORMODYNE) injection 10 mg (10 mg Intravenous Given 03/21/21 2150)    ED Course/ Medical Decision Making/ A&P  Medical Decision Making Amount and/or Complexity of Data Reviewed Labs: ordered. Radiology: ordered.  Risk Decision regarding hospitalization.   Hannah Mcdaniel is here with left-sided weakness and numbness.  Code stroke with EMS.  Neurology at the bedside upon arrival to the ED.  Blood pressure is elevated in the 160V systolic.  Last known normal about an hour ago.  CT scan per my review interpretation shows no obvious head bleed.  Per radiology report however early changes of likely a right MCA acute infarct.  She also has a large ventricles but no evidence of obstruction.  CT perfusion study shows no large vessel occlusion.  Neurology talked with family and patient was given tPA.  She was also given labetalol and blood pressure improved prior to patient getting tPA.  Lab work was ordered including CBC, BMP.  Per my review and interpretation these lab work are unremarkable.  EKG per my interpretation shows sinus rhythm.  Nonspecific T wave changes.  Given that patient had tPA she will be admitted to neurology service at neurological ICU.  Stable throughout my care.  Started on Cleviprex infusion for blood pressure as well.  This chart was dictated using voice recognition software.  Despite best efforts to proofread,  errors can occur which can change the documentation meaning.          Final Clinical Impression(s) / ED Diagnoses Final diagnoses:  Cerebrovascular accident (CVA),  unspecified mechanism [facility])    Rx / DC Orders ED Discharge Orders     None         Lennice Sites, DO 03/22/21 0001    Lennice Sites, DO 03/22/21 0015

## 2021-03-22 ENCOUNTER — Inpatient Hospital Stay (HOSPITAL_COMMUNITY): Payer: 59

## 2021-03-22 DIAGNOSIS — E782 Mixed hyperlipidemia: Secondary | ICD-10-CM

## 2021-03-22 DIAGNOSIS — I6389 Other cerebral infarction: Secondary | ICD-10-CM

## 2021-03-22 DIAGNOSIS — Z9282 Status post administration of tPA (rtPA) in a different facility within the last 24 hours prior to admission to current facility: Secondary | ICD-10-CM

## 2021-03-22 LAB — CBC
HCT: 41.9 % (ref 36.0–46.0)
HCT: 41 % (ref 36.0–46.0)
Hemoglobin: 14.6 g/dL (ref 12.0–15.0)
Hemoglobin: 14.6 g/dL (ref 12.0–15.0)
MCH: 31.1 pg (ref 26.0–34.0)
MCH: 31.1 pg (ref 26.0–34.0)
MCHC: 34.8 g/dL (ref 30.0–36.0)
MCHC: 35.6 g/dL (ref 30.0–36.0)
MCV: 89.3 fL (ref 80.0–100.0)
MCV: 87.2 fL (ref 80.0–100.0)
Platelets: 258 10*3/uL (ref 150–400)
Platelets: 250 10*3/uL (ref 150–400)
RBC: 4.69 MIL/uL (ref 3.87–5.11)
RBC: 4.7 MIL/uL (ref 3.87–5.11)
RDW: 11.9 % (ref 11.5–15.5)
RDW: 11.8 % (ref 11.5–15.5)
WBC: 10.4 10*3/uL (ref 4.0–10.5)
WBC: 10.6 10*3/uL — ABNORMAL HIGH (ref 4.0–10.5)
nRBC: 0 % (ref 0.0–0.2)
nRBC: 0 % (ref 0.0–0.2)

## 2021-03-22 LAB — DIFFERENTIAL
Abs Immature Granulocytes: 0.02 10*3/uL (ref 0.00–0.07)
Basophils Absolute: 0.1 10*3/uL (ref 0.0–0.1)
Basophils Relative: 1 %
Eosinophils Absolute: 0.1 10*3/uL (ref 0.0–0.5)
Eosinophils Relative: 1 %
Immature Granulocytes: 0 %
Lymphocytes Relative: 42 %
Lymphs Abs: 4.4 10*3/uL — ABNORMAL HIGH (ref 0.7–4.0)
Monocytes Absolute: 0.5 10*3/uL (ref 0.1–1.0)
Monocytes Relative: 5 %
Neutro Abs: 5.3 10*3/uL (ref 1.7–7.7)
Neutrophils Relative %: 51 %

## 2021-03-22 LAB — HEMOGLOBIN A1C
Hgb A1c MFr Bld: 6.1 % — ABNORMAL HIGH (ref 4.8–5.6)
Mean Plasma Glucose: 128.37 mg/dL

## 2021-03-22 LAB — BASIC METABOLIC PANEL
Anion gap: 9 (ref 5–15)
BUN: 18 mg/dL (ref 6–20)
CO2: 26 mmol/L (ref 22–32)
Calcium: 9.3 mg/dL (ref 8.9–10.3)
Chloride: 101 mmol/L (ref 98–111)
Creatinine, Ser: 0.83 mg/dL (ref 0.44–1.00)
GFR, Estimated: >60 mL/min (ref >60)
Glucose, Bld: 148 mg/dL — ABNORMAL HIGH (ref 70–99)
Potassium: 3.1 mmol/L — ABNORMAL LOW (ref 3.5–5.1)
Sodium: 136 mmol/L (ref 135–145)

## 2021-03-22 LAB — LIPID PANEL
Cholesterol: 261 mg/dL — ABNORMAL HIGH (ref 0–200)
HDL: 41 mg/dL (ref >40)
LDL Cholesterol: 162 mg/dL — ABNORMAL HIGH (ref 0–99)
Total CHOL/HDL Ratio: 6.4 RATIO
Triglycerides: 288 mg/dL — ABNORMAL HIGH (ref <150)
VLDL: 58 mg/dL — ABNORMAL HIGH (ref 0–40)

## 2021-03-22 LAB — ECHOCARDIOGRAM COMPLETE
AR max vel: 2.32 cm2
AV Area VTI: 1.99 cm2
AV Area mean vel: 1.89 cm2
AV Mean grad: 4 mmHg
AV Peak grad: 7.5 mmHg
Ao pk vel: 1.37 m/s
Area-P 1/2: 2.74 cm2
S' Lateral: 2.8 cm
Weight: 2271.62 oz

## 2021-03-22 LAB — RESP PANEL BY RT-PCR (FLU A&B, COVID) ARPGX2
Influenza A by PCR: NEGATIVE
Influenza B by PCR: NEGATIVE
SARS Coronavirus 2 by RT PCR: NEGATIVE

## 2021-03-22 LAB — VITAMIN B12: Vitamin B-12: 221 pg/mL (ref 180–914)

## 2021-03-22 LAB — GLUCOSE, CAPILLARY
Glucose-Capillary: 179 mg/dL — ABNORMAL HIGH (ref 70–99)
Glucose-Capillary: 141 mg/dL — ABNORMAL HIGH (ref 70–99)
Glucose-Capillary: 255 mg/dL — ABNORMAL HIGH (ref 70–99)
Glucose-Capillary: 75 mg/dL (ref 70–99)
Glucose-Capillary: 266 mg/dL — ABNORMAL HIGH (ref 70–99)

## 2021-03-22 LAB — RPR: RPR Ser Ql: NONREACTIVE

## 2021-03-22 LAB — TSH: TSH: 2.647 u[IU]/mL (ref 0.350–4.500)

## 2021-03-22 LAB — MRSA NEXT GEN BY PCR, NASAL: MRSA by PCR Next Gen: NOT DETECTED

## 2021-03-22 MED ORDER — ASPIRIN EC 81 MG PO TBEC
81.0000 mg | DELAYED_RELEASE_TABLET | Freq: Every day | ORAL | Status: DC
  Administered 2021-03-23: 81 mg via ORAL
  Filled 2021-03-22: qty 1

## 2021-03-22 MED ORDER — PANTOPRAZOLE SODIUM 40 MG PO TBEC
40.0000 mg | DELAYED_RELEASE_TABLET | Freq: Every day | ORAL | Status: DC
  Administered 2021-03-23: 40 mg via ORAL
  Filled 2021-03-22: qty 1

## 2021-03-22 MED ORDER — FLUOXETINE HCL 20 MG PO CAPS
40.0000 mg | ORAL_CAPSULE | Freq: Every day | ORAL | Status: DC
  Administered 2021-03-23: 40 mg via ORAL
  Filled 2021-03-22: qty 2

## 2021-03-22 MED ORDER — POTASSIUM CHLORIDE CRYS ER 20 MEQ PO TBCR
40.0000 meq | EXTENDED_RELEASE_TABLET | ORAL | Status: AC
  Administered 2021-03-22 (×2): 40 meq via ORAL
  Filled 2021-03-22 (×2): qty 2

## 2021-03-22 MED ORDER — CLOPIDOGREL BISULFATE 75 MG PO TABS
75.0000 mg | ORAL_TABLET | Freq: Every day | ORAL | Status: DC
  Administered 2021-03-23: 75 mg via ORAL
  Filled 2021-03-22: qty 1

## 2021-03-22 MED ORDER — INSULIN ASPART 100 UNIT/ML IJ SOLN
0.0000 [IU] | Freq: Three times a day (TID) | INTRAMUSCULAR | Status: DC
  Administered 2021-03-22: 8 [IU] via SUBCUTANEOUS
  Administered 2021-03-22: 2 [IU] via SUBCUTANEOUS

## 2021-03-22 NOTE — Progress Notes (Signed)
Echocardiogram 2D Echocardiogram has been performed.  Arlyss Gandy 03/22/2021, 9:52 AM

## 2021-03-22 NOTE — Progress Notes (Signed)
Per MD Pt may travel to MRI without RN

## 2021-03-22 NOTE — Progress Notes (Signed)
SLP Cancellation Note  Patient Details Name: Hannah Mcdaniel MRN: [id number] DOB: 15-May-1965   Cancelled treatment:       Reason Eval/Treat Not Completed: SLP screened, no needs identified, will sign off.    Evy Lutterman, Katherene Ponto 03/22/2021, 10:00 AM

## 2021-03-22 NOTE — TOC CAGE-AID Note (Signed)
Transition of Care [facility]) - CAGE-AID Screening   Patient Details  Name: Hannah Mcdaniel MRN: [id number] Date of Birth: 1965-11-28  Transition of Care [facility]) CM/SW Contact:    Paxton Binns C Tarpley-Carter, [place] Phone Number: 03/22/2021, 10:18 AM   Clinical Narrative: Pt participated in [place].  Pt stated she does not use substance or ETOH.  Pt was not offered resources, due to no usage of substance or ETOH.    Dyamond Tolosa Tarpley-Carter, MSW, LCSW-A Pronouns:  She/Her/[place] Transitions of Care Clinical Social Worker Direct Number:  [phone] [email or web address]  CAGE-AID Screening:    Have You Ever Felt You Ought to Cut Down on Your Drinking or Drug Use?: No Have People Annoyed You By [employer] Your Drinking Or Drug Use?: No Have You Felt Bad Or Guilty About Your Drinking Or Drug Use?: No Have You Ever Had a Drink or Used Drugs First Thing In The Morning to Steady Your Nerves or to Get Rid of a Hangover?: No CAGE-AID Score: 0  Substance Abuse Education Offered: No

## 2021-03-22 NOTE — Evaluation (Signed)
Physical Therapy Evaluation Patient Details Name: Hannah Mcdaniel MRN: [id number] DOB: 24-Mar-1965 Today's Date: 03/22/2021  History of Present Illness  56 y.o. female presents to [place] on 03/21/2021 with sudden onset LLE weakness and L sided sensation changes. Pt received tNK. PMH includes DMII, HLD, migraine, GERD, PVD.  Clinical Impression  Pt presents to PT with deficits in L-sided strength, balance, and gait. Pt with lateral drift when ambulating, utilizing stepping strategy to correct for mild losses of balance. PT encourages the pt to ambulate multiple times daily during this admission with staff or family supervision. PT anticipates LLE strength will continue to improve with progressive mobility. Acute PT will continue to follow.       Recommendations for follow up therapy are one component of a multi-disciplinary discharge planning process, led by the attending physician.  Recommendations may be updated based on patient status, additional functional criteria and insurance authorization.  Follow Up Recommendations No PT follow up    Assistance Recommended at Discharge PRN  Patient can return home with the following  A little help with walking and/or transfers;A little help with bathing/dressing/bathroom;Assistance with cooking/housework;Assist for transportation;Help with stairs or ramp for entrance    Equipment Recommendations None recommended by PT  Recommendations for Other Services       Functional Status Assessment Patient has had a recent decline in their functional status and demonstrates the ability to make significant improvements in function in a reasonable and predictable amount of time.     Precautions / Restrictions Precautions Precautions: Fall Restrictions Weight Bearing Restrictions: No      Mobility  Bed Mobility                    Transfers Overall transfer level: Independent                       Ambulation/Gait Ambulation/Gait assistance: Supervision Gait Distance (Feet): 200 Feet Assistive device: None Gait Pattern/deviations: Step-through pattern, Drifts right/left Gait velocity: functional Gait velocity interpretation: 1.31 - 2.62 ft/sec, indicative of limited community ambulator   General Gait Details: pt with lateral drift but able to correct for losses of balance with stepping strategy  Stairs            Wheelchair Mobility    Modified Rankin (Stroke Patients Only) Modified Rankin (Stroke Patients Only) Pre-Morbid Rankin Score: No symptoms Modified Rankin: Moderately severe disability     Balance Overall balance assessment: Needs assistance Sitting-balance support: No upper extremity supported, Feet supported Sitting balance-Leahy Scale: Good     Standing balance support: No upper extremity supported, During functional activity Standing balance-Leahy Scale: Good                               Pertinent Vitals/Pain Pain Assessment Pain Assessment: No/denies pain    Home Living Family/patient expects to be discharged to:: Private residence Living Arrangements: Spouse/significant other;Other relatives (mother in-law) Available Help at Discharge: Family;Available PRN/intermittently Type of Home: Other(Comment) (double wide trailor) Home Access: Ramped entrance       Home Layout: One level Home Equipment: Cane - single point;Shower seat;BSC/3in1 Additional Comments: 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  MMT 5/5 to L UE. LUE Sensation: WNL LUE Coordination: WNL    Lower Extremity Assessment Lower Extremity Assessment: LLE deficits/detail LLE Deficits / Details: grossly 4+/5    Cervical / Trunk Assessment Cervical / Trunk Assessment: Normal  Communication   Communication: No difficulties  Cognition Arousal/Alertness: Awake/alert Behavior During Therapy: WFL for tasks assessed/performed Overall Cognitive Status: Within Functional Limits for tasks assessed                                          General Comments General comments (skin integrity, edema, etc.): VSS on RA    Exercises     Assessment/Plan    PT Assessment Patient needs continued PT services  PT Problem List Decreased strength;Decreased balance;Decreased mobility       PT Treatment Interventions Gait training;DME instruction;Stair training;Balance training;Neuromuscular re-education;Patient/family education;Therapeutic activities    PT Goals (Current goals can be found in the Care Plan section)  Acute Rehab PT Goals Patient Stated Goal: to return to prior level of function PT Goal Formulation: With patient Time For Goal Achievement: 04/05/21 Potential to Achieve Goals: Good Additional Goals Additional Goal #1: Pt will score >19/24 on the DGI to indicate a reduced risk for falls    Frequency Min 4X/week     Co-evaluation               AM-PAC PT "6 Clicks" Mobility  Outcome Measure Help needed turning from your back to your side while in a flat bed without using bedrails?: None Help needed moving from lying on your back to sitting on the side of a flat bed without using bedrails?: None Help needed moving to and from a bed to a chair (including a wheelchair)?: None Help needed standing up from a chair using your arms (e.g., wheelchair or bedside chair)?: None Help needed to walk in hospital room?: A Little Help needed climbing 3-5 steps with a railing? : A  Little 6 Click Score: 22    End of Session   Activity Tolerance: Patient tolerated treatment well Patient left: in chair;with call bell/phone within reach;with family/visitor present Nurse Communication: Mobility status PT Visit Diagnosis: Other abnormalities of gait and mobility (R26.89)    Time: 7517-0017 PT Time Calculation (min) (ACUTE ONLY): 17 min   Charges:   PT Evaluation $PT Eval Low Complexity: [place], PT, DPT Acute Rehabilitation Pager: [phone] Office [phone]   Zenaida Niece 03/22/2021, 12:50 PM

## 2021-03-22 NOTE — Evaluation (Signed)
Occupational Therapy Evaluation Patient Details Name: Hannah Mcdaniel MRN: [id number] DOB: [birth year] Today's Date: 03/22/2021   History of Present Illness 56 y.o. female with PMH significant for diabetes, hyperlipidemia, migraine, GERD, smoker with attempting to cut down, peripheral vascular disease on Plavix who presents with sudden onset left leg weakness and decreased sensation in left arm and left leg.  CT Head:Ganglionic level infarction (caudate, lentiform nuclei, internal  capsule, insula, M1-M3 cortex. Markedly enlarged lateral and third ventricles.  F/u MRI pending some time 2/17.   Clinical Impression   Patient admitted for the diagnosis above.  PTA she lives at home with her spouse.  She drove and needed no assist with mobility, ADL/IADL.  Patient stating sensory changes and left hemibody weakness have resolved.  OT with generalized supervision for in room mobility and ADL completion.  No assist needed.  OT will follow in the acute setting pending follow up MRI.  She is hoping to return home, and has family that can provide assist as needed initially.          Recommendations for follow up therapy are one component of a multi-disciplinary discharge planning process, led by the attending physician.  Recommendations may be updated based on patient status, additional functional criteria and insurance authorization.   Follow Up Recommendations  Other (comment) (Initial 24 hour assist as needed.)    Assistance Recommended at Discharge PRN  Patient can return home with the following      Functional Status Assessment  Patient has had a recent decline in their functional status and demonstrates the ability to make significant improvements in function in a reasonable and predictable amount of time.  Equipment Recommendations  None recommended by OT    Recommendations for Other Services       Precautions / Restrictions Precautions Precautions: Fall Restrictions Weight Bearing  Restrictions: No      Mobility Bed Mobility Overal bed mobility: Independent                  Transfers Overall transfer level: Needs assistance Equipment used: None Transfers: Sit to/from Stand, Bed to chair/wheelchair/BSC Sit to Stand: Supervision     Step pivot transfers: Supervision     General transfer comment: No assist and no LOB noted      Balance Overall balance assessment: No apparent balance deficits (not formally assessed)                                         ADL either performed or assessed with clinical judgement   ADL                                         General ADL Comments: Generalized supervision for line management.  No assist needed.     Vision Baseline Vision/History: 1 Wears glasses Patient Visual Report: No change from baseline       Perception Perception Perception: Within Functional Limits   Praxis Praxis Praxis: Intact    Pertinent Vitals/Pain Pain Assessment Pain Assessment: No/denies pain     Hand Dominance Right   Extremity/Trunk Assessment Upper Extremity Assessment Upper Extremity Assessment: Overall WFL for tasks assessed;LUE deficits/detail LUE Deficits / Details: Patient stating no numbness or strength deficts.  MMT 5/5 to L UE. LUE Sensation: WNL LUE Coordination: WNL  Lower Extremity Assessment Lower Extremity Assessment: Defer to PT evaluation   Cervical / Trunk Assessment Cervical / Trunk Assessment: Normal   Communication Communication Communication: No difficulties   Cognition Arousal/Alertness: Awake/alert Behavior During Therapy: WFL for tasks assessed/performed Overall Cognitive Status: Within Functional Limits for tasks assessed                                       General Comments  PT eval pending.  VSS    Exercises     Shoulder Instructions      Home Living Family/patient expects to be discharged to:: Private residence Living  Arrangements: Spouse/significant other Available Help at Discharge: Family;Available 24 hours/day Type of Home: House Home Access: Ramped entrance     Home Layout: One level     Bathroom Shower/Tub: Occupational psychologist: Standard Bathroom Accessibility: Yes How Accessible: Accessible via walker Home Equipment: [place] - single point;Shower seat   Additional Comments: Patient does not use SPC.  States she needed it for a short while after her last TIA.      Prior Functioning/Environment Prior Level of Function : Independent/Modified Independent;Driving               ADLs Comments: No assist with ADL or IADL        OT Problem List: Decreased safety awareness;Impaired UE functional use      OT Treatment/Interventions: Self-care/ADL training;Patient/family education;Therapeutic activities    OT Goals(Current goals can be found in the care plan section) Acute Rehab OT Goals Patient Stated Goal: Want to get out of the bed OT Goal Formulation: With patient Time For Goal Achievement: 04/05/21 Potential to Achieve Goals: Good ADL Goals Pt Will Perform Grooming: Independently;standing Pt Will Perform Lower Body Dressing: Independently;sit to/from stand Pt Will Transfer to Toilet: Independently;ambulating;regular height toilet  OT Frequency: Min 2X/week    Co-evaluation              AM-PAC OT "6 Clicks" Daily Activity     Outcome Measure Help from another person eating meals?: None Help from another person taking care of personal grooming?: None Help from another person toileting, which includes using toliet, bedpan, or urinal?: A Little Help from another person bathing (including washing, rinsing, drying)?: A Little Help from another person to put on and taking off regular upper body clothing?: None Help from another person to put on and taking off regular lower body clothing?: A Little 6 Click Score: 21   End of Session    Activity Tolerance:  Patient tolerated treatment well Patient left: in chair;with call bell/phone within reach  OT Visit Diagnosis: Muscle weakness (generalized) (M62.81)                Time: 0712-1975 OT Time Calculation (min): 19 min Charges:  OT General Charges $OT Visit: 1 Visit OT Evaluation $OT Eval Moderate Complexity: 1 Mod  03/22/2021  RP, OTR/L  Acute Rehabilitation Services  Office:  [phone]   Hannah Mcdaniel 03/22/2021, 11:30 AM

## 2021-03-22 NOTE — Progress Notes (Addendum)
STROKE TEAM PROGRESS NOTE   SUBJECTIVE (INTERVAL HISTORY) Her mother and stepfather are at the bedside.  Overall her condition is rapidly improving. Reports that she had L sided weakness that has resolved since TNK. She reports feeling back to normal this AM.  Reports hx of migraines for as long as she can remember. Denies N/V, aura, photophobia. Reports gait instability yesterday, but otherwise normal, no HA when bending over. Denies urinary and fecal incontinence. Patient reports family hx of strokes in mom (2 strokes) and maternal GMA, as well as a history of hypercholesterolemia.   CTH with early changes of likely acute R MCA infarct. Markedly enlarged lateral and third ventricles. ASPECTS 9.  OBJECTIVE CBC:  Recent Labs  Lab 03/21/21 2132 03/21/21 2148 03/22/21 0107  WBC 10.4  --  10.6*  NEUTROABS 5.3  --   --   HGB 14.6 14.3 14.6  HCT 41.9 42.0 41.0  MCV 89.3  --  87.2  PLT 258  --  194   Basic Metabolic Panel:  Recent Labs  Lab 03/21/21 2132 03/21/21 2148 03/22/21 0107  NA 135 137 136  K 3.4* 3.5 3.1*  CL 101 100 101  CO2 24  --  26  GLUCOSE 145* 142* 148*  BUN 17 18 18   CREATININE 0.83 0.80 0.83  CALCIUM 9.3  --  9.3   Lipid Panel:  Recent Labs  Lab 03/22/21 0107  CHOL 261*  TRIG 288*  HDL 41  CHOLHDL 6.4  VLDL 58*  LDLCALC 162*   HgbA1c:  Recent Labs  Lab 03/22/21 0107  HGBA1C 6.1*   Urine Drug Screen: No results for input(s): LABOPIA, COCAINSCRNUR, LABBENZ, AMPHETMU, THCU, LABBARB in the last 168 hours.  Alcohol Level  Recent Labs  Lab 03/21/21 2132  ETH <10    IMAGING past 24 hours CT HEAD CODE STROKE WO CONTRAST  Result Date: 03/21/2021 CLINICAL DATA:  Code stroke.  Acute neurologic deficit EXAM: CT HEAD WITHOUT CONTRAST TECHNIQUE: Contiguous axial images were obtained from the base of the skull through the vertex without intravenous contrast. RADIATION DOSE REDUCTION: This exam was performed according to the departmental dose-optimization  program which includes automated exposure control, adjustment of the mA and/or kV according to patient size and/or use of iterative reconstruction technique. COMPARISON:  None. FINDINGS: Brain: There is no mass, hemorrhage or extra-axial collection. Markedly enlarged lateral and third ventricles without a clear source of obstruction. There is cortical hypoattenuation in the right MCA territory. Vascular: No abnormal hyperdensity of the major intracranial arteries or dural venous sinuses. No intracranial atherosclerosis. Skull: The visualized skull base, calvarium and extracranial soft tissues are normal. Sinuses/Orbits: No fluid levels or advanced mucosal thickening of the visualized paranasal sinuses. No mastoid or middle ear effusion. The orbits are normal. ASPECTS [facility] Stroke Program Early CT Score) - Ganglionic level infarction (caudate, lentiform nuclei, internal capsule, insula, M1-M3 cortex): 7 - Supraganglionic infarction (M4-M6 cortex): 2 Total score (0-10 with 10 being normal): 9 IMPRESSION: 1. Early changes of likely right MCA small acute infarct. 2. Markedly enlarged lateral and third ventricles without a clear source of obstruction. This could indicate aqua ductal stenosis. 3. ASPECTS is 9. These results were called by telephone at the time of interpretation on 03/21/2021 at 9:47 pm to provider [facility], who verbally acknowledged these results. Electronically Signed   By: Ulyses Jarred M.D.   On: 03/21/2021 21:48   CT ANGIO HEAD NECK W WO CM (CODE STROKE)  Result Date: 03/21/2021 CLINICAL DATA:  Code stroke EXAM: CT ANGIOGRAPHY HEAD AND NECK TECHNIQUE: Multidetector CT imaging of the head and neck was performed using the standard protocol during bolus administration of intravenous contrast. Multiplanar CT image reconstructions and MIPs were obtained to evaluate the vascular anatomy. Carotid stenosis measurements (when applicable) are obtained utilizing NASCET criteria, using the distal  internal carotid diameter as the denominator. RADIATION DOSE REDUCTION: This exam was performed according to the departmental dose-optimization program which includes automated exposure control, adjustment of the mA and/or kV according to patient size and/or use of iterative reconstruction technique. CONTRAST:  103mL OMNIPAQUE IOHEXOL 350 MG/ML SOLN COMPARISON:  None. FINDINGS: CTA NECK FINDINGS SKELETON: There is no bony spinal canal stenosis. No lytic or blastic lesion. OTHER NECK: Normal pharynx, larynx and major salivary glands. No cervical lymphadenopathy. Unremarkable thyroid gland. UPPER CHEST: No pneumothorax or pleural effusion. No nodules or masses. AORTIC ARCH: There is calcific atherosclerosis of the aortic arch. There is no aneurysm, dissection or hemodynamically significant stenosis of the visualized portion of the aorta. Conventional 3 vessel aortic branching pattern. The visualized proximal subclavian arteries are widely patent. RIGHT CAROTID SYSTEM: Normal without aneurysm, dissection or stenosis. LEFT CAROTID SYSTEM: Normal without aneurysm, dissection or stenosis. VERTEBRAL ARTERIES: Left dominant configuration. Both origins are clearly patent. There is no dissection, occlusion or flow-limiting stenosis to the skull base (V1-V3 segments). CTA HEAD FINDINGS POSTERIOR CIRCULATION: --Vertebral arteries: Normal V4 segments. --Inferior cerebellar arteries: Normal. --Basilar artery: Normal. --Superior cerebellar arteries: Normal. --Posterior cerebral arteries (PCA): Normal. ANTERIOR CIRCULATION: --Intracranial internal carotid arteries: Normal. --Anterior cerebral arteries (ACA): Normal. Both A1 segments are present. Patent anterior communicating artery (a-comm). --Middle cerebral arteries (MCA): Normal. VENOUS SINUSES: As permitted by contrast timing, patent. ANATOMIC VARIANTS: None Review of the MIP images confirms the above findings. IMPRESSION: 1. No emergent large vessel occlusion or high-grade  stenosis of the intracranial arteries. Aortic Atherosclerosis (ICD10-I70.0). Electronically Signed   By: Ulyses Jarred M.D.   On: 03/21/2021 22:27     PHYSICAL EXAM Temp:  [97.8 F (36.6 C)-98.3 F (36.8 C)] 98.3 F (36.8 C) (02/17 0400) Pulse Rate:  [59-75] 62 (02/17 0800) Resp:  [10-25] 18 (02/17 0800) BP: (107-208)/(59-146) 176/69 (02/17 0800) SpO2:  [87 %-96 %] 92 % (02/17 0800) Weight:  [64.4 kg] 64.4 kg (02/16 1900)  General - Well nourished, well developed, age congruent woman in no apparent distress.  Cardiovascular - Regular rhythm and rate on telemetry.  Mental Status -  Level of arousal and orientation to time, place, and person were intact. Language including expression, naming, repetition, comprehension was assessed and found intact. Attention span and concentration were normal. Recent and remote memory were intact. 2/3 word recall Fund of Knowledge was assessed and was intact. Able to name 12 animals that walk on 4 legs.   Cranial Nerves II - XII - II - Visual field intact OU. III, IV, VI - Extraocular movements intact. V - Facial sensation intact bilaterally. VII - Facial movement intact bilaterally. VIII - Hearing & vestibular intact bilaterally. X - Palate elevates symmetrically. XI - Chin turning & shoulder shrug intact bilaterally. XII - Tongue protrusion intact.  Motor Strength - The patients strength was normal in all extremities and pronator drift was absent.  Bulk was normal and fasciculations were absent.   Motor Tone - Muscle tone was assessed at the neck and appendages and was normal.  Sensory - Light touch was  assessed and symmetrical.    Coordination - The patient had some incoordination of rapid  movements and FNF in L hand. Intact R hand. Tremor was absent.  Gait and Station - Gait slow and unbalanced/ataxic with L lean.    ASSESSMENT/PLAN Ms. CORDIE BEAZLEY is a 56 y.o. female with history of diabetes, hyperlipidemia, migraine, GERD,  smoker with attempting to cut down, peripheral vascular disease on Plavix  presenting with sudden onset left leg weakness and decreased sensation in left arm and left leg s/p tNKASE  Stroke:  of right middle cerebral artery infarct likely secondary to due to embolism Code Stroke CT head early changes of likely acute R MCA infarct. Markedly enlarged lateral and third ventricles. ASPECTS 9. CTA head & neck No large vessel occlusion or stenosis. MRI  pending 2D Echo read pending LDL 162 HgbA1c 6.1 Telemetry monitoring for arrhythmia x 72 hours VTE prophylaxis - SCDs aspirin 81 mg daily and clopidogrel 75 mg daily prior to admission, now on No antithrombotic. Will consider resuming anticoagulation after assessing repeat imaging  Therapy recommendations:  No PT, PRN OT, with initial 24 hour assist PRN Disposition:  Pending; plan to transfer out of ICU if MRI stable.  Ventriculomegaly of lateral and third ventricles Asx, no sx of NPH reported Will assess MRI for signs of aqueduct stenosis.  Follow-up with outpatient neurology  Hypertension Home meds:  N/A Goal SBP <180/105 post TNK Long-term BP goal normotensive  Hyperlipidemia Home meds:  Fenofibrate 135 mg, Zetia 10 mg, Rosuvastatin 40 mg, resumed in hospital LDL 162, goal < 70 High intensity statin resumed Continue statin at discharge  Diabetes type II Controlled Home meds:  Metformin 500 mg 2 tablets AM and 1 tablet PM HgbA1c 6.1, goal < 7.0 CBGs SSI, carb modified diet  Other Stroke Risk Factors Cigarette smoker. 0.25 PPD. Advised to stop smoking; patient willing ETOH use, alcohol level <10, advised to drink no more than 1-2 drink(s) a day Family hx stroke (Mother, Maternal grandmother) Arterial occlusive disease and claudication of RLE Migraines  Other Active Problems Migraines  Home med: Sumatriptan 100 mg Advised to discontinue home med in the future with concern for stroke.  Tobacco Use Disorder- moderate,  dependent Continue nicotine patch and home [facility] day # 1   Rosezetta Schlatter, MD Stroke Neurology- Neuro Psych Resident 03/22/2021 8:24 AM  ATTENDING NOTE: I reviewed above note and agree with the assessment and plan. Pt was seen and examined.   56 year old female with history of diabetes, hyperlipidemia, migraine, smoker, PVD admitted for left leg and hand weakness numbness.  CT questionable right MCA small infarct, third ventricle and lateral ventricle enlargement.  Status post TNK.  CTA head and neck unremarkable.  MRI pending.  EF 55%.  LDL 162, A1c 6.1.  UDS pending.  Creatinine 0.8.  BP elevated.  On exam, patient neurologically intact.  Patient stated that she has history of migraine since she was young, 1 to 2/months, on Imitrex, denies nausea vomiting, photophobia or phonophobia.  With this episode, patient also had a headache which not quite resemble her migraine.  She has significant hyperlipidemia with family history, on Repatha, Zetia, Crestor 40 and fenofibrate at home.  PT/OT no recommendation.  Currently she is on Zetia, fenofibrate and Crestor 40.  BP stable.  Consider transition to resume aspirin and Plavix after MRI if no bleeding.  Continue her home regimen for hyperlipidemia.  For detailed assessment and plan, please refer to above as I have made changes wherever appropriate.   Rosalin Hawking, MD PhD Stroke Neurology 03/22/2021 6:42 PM  This patient is critically ill due to strokelike symptoms status post TNK and at significant risk of neurological worsening, death form stroke, hemorrhagic transformation, bleeding from TN K. This patient's care requires constant monitoring of vital signs, hemodynamics, respiratory and cardiac monitoring, review of multiple databases, neurological assessment, discussion with family, other specialists and medical decision making of high complexity. I spent 35 minutes of neurocritical care time in the care of this patient.    To  contact Stroke Continuity provider, please refer to [email or web address] After hours, contact General Neurology

## 2021-03-22 NOTE — Progress Notes (Signed)
°  Transition of Care [facility]) Screening Note   Patient Details  Name: ELIS RAWLINSON Date of Birth: 1965/11/02   Transition of Care [facility]) CM/SW Contact:    Benard Halsted, LCSW Phone Number: 03/22/2021, 8:51 AM    Transition of Care Department [facility]) has reviewed patient and no TOC needs have been identified at this time. We will continue to monitor patient advancement through interdisciplinary progression rounds. If new patient transition needs arise, please place a TOC consult.

## 2021-03-22 NOTE — Progress Notes (Signed)
PT Cancellation Note  Patient Details Name: Hannah Mcdaniel MRN: [id number] DOB: 27-Mar-1965   Cancelled Treatment:    Reason Eval/Treat Not Completed: Active bedrest order. Pt on bedrest until 03/22/2021 at 2233 per orders. Please contact PT if bedrest orders are removed earlier and pt becomes appropriate to mobilize.   Zenaida Niece 03/22/2021, 8:08 AM

## 2021-03-22 NOTE — Progress Notes (Signed)
Dr. Lorrin Goodell updated that patient passed bedside [facility] Screen.

## 2021-03-23 DIAGNOSIS — R531 Weakness: Secondary | ICD-10-CM | POA: Diagnosis not present

## 2021-03-23 LAB — BASIC METABOLIC PANEL
Anion gap: 9 (ref 5–15)
BUN: 15 mg/dL (ref 6–20)
CO2: 24 mmol/L (ref 22–32)
Calcium: 8.9 mg/dL (ref 8.9–10.3)
Chloride: 104 mmol/L (ref 98–111)
Creatinine, Ser: 0.92 mg/dL (ref 0.44–1.00)
GFR, Estimated: >60 mL/min (ref >60)
Glucose, Bld: 152 mg/dL — ABNORMAL HIGH (ref 70–99)
Potassium: 4.4 mmol/L (ref 3.5–5.1)
Sodium: 137 mmol/L (ref 135–145)

## 2021-03-23 LAB — GLUCOSE, CAPILLARY
Glucose-Capillary: 177 mg/dL — ABNORMAL HIGH (ref 70–99)
Glucose-Capillary: 178 mg/dL — ABNORMAL HIGH (ref 70–99)

## 2021-03-23 LAB — HIV ANTIBODY (ROUTINE TESTING W REFLEX): HIV Screen 4th Generation wRfx: NONREACTIVE

## 2021-03-23 LAB — CBC
HCT: 37.8 % (ref 36.0–46.0)
Hemoglobin: 13 g/dL (ref 12.0–15.0)
MCH: 30.8 pg (ref 26.0–34.0)
MCHC: 34.4 g/dL (ref 30.0–36.0)
MCV: 89.6 fL (ref 80.0–100.0)
Platelets: 244 10*3/uL (ref 150–400)
RBC: 4.22 MIL/uL (ref 3.87–5.11)
RDW: 12 % (ref 11.5–15.5)
WBC: 10.2 10*3/uL (ref 4.0–10.5)
nRBC: 0 % (ref 0.0–0.2)

## 2021-03-23 NOTE — Discharge Summary (Addendum)
Stroke Discharge Summary  Patient ID: Hannah Mcdaniel   MRN: [id number]      DOB: 09/08/1965  Date of Admission: 03/21/2021 Date of Discharge: 03/23/2021  Attending Physician:  Stroke, Md, MD, Stroke MD Consultant(s):    None  Patient's PCP:  [name], FNP  DISCHARGE DIAGNOSIS:  Principal Problem:   Bilateral BG small infarcts s/p TNK  Active issue Cerebral ventricle enlargement HTN HLD DM Smoker  Migraine PVD on DAPT   Allergies as of 03/23/2021       Reactions   Asa [aspirin] Rash   Can take low-dose aspirin.   Bempedoic Acid Other (See Comments)   Bilateral shoulder pain.   Codeine Rash   Penicillins Rash   Has patient had a PCN reaction causing immediate rash, facial/tongue/throat swelling, SOB or lightheadedness with hypotension:Yes Has patient had a PCN reaction causing severe rash involving mucus membranes or skin necrosis:Yes Has patient had a PCN reaction that required hospitalization:No Has patient had a PCN reaction occurring within the last 10 years:No If all of the above answers are "NO", then may proceed with Cephalosporin use.        Medication List     STOP taking these medications    flunisolide 25 MCG/ACT (0.025%) Soln Commonly known as: NASALIDE   SUMAtriptan 100 MG tablet Commonly known as: IMITREX       TAKE these medications    aspirin EC 81 MG tablet Take 81 mg by mouth daily.   Blood Glucose Monitoring Suppl Kit by Does not apply route daily.   buPROPion 300 MG 24 hr tablet Commonly known as: WELLBUTRIN XL TAKE 1 TABLET (300 MG TOTAL) BY MOUTH DAILY. TAKE IN ADDITION TO 150 MG FOR A TOTAL OF 450 MG DAILY.   clopidogrel 75 MG tablet Commonly known as: Plavix Take 1 tablet (75 mg total) by mouth daily.   ezetimibe 10 MG tablet Commonly known as: ZETIA Take 1 tablet (10 mg total) by mouth daily.   Fenofibric Acid 135 MG Cpdr Take 135 mg by mouth at bedtime.   FLUoxetine 40 MG capsule Commonly known as:  PROZAC Take 1 capsule (40 mg total) by mouth daily.   FreeStyle Libre 2 Reader Devi 1 Device by Does not apply route continuous.   FreeStyle Libre 2 Sensor Misc 1 Device by Does not apply route every 14 (fourteen) days.   levocetirizine 5 MG tablet Commonly known as: XYZAL TAKE 1 TABLET BY MOUTH EVERY DAY IN THE EVENING What changed:  how much to take how to take this when to take this additional instructions   loperamide 2 MG tablet Commonly known as: Imodium A-D Take 1 tablet (2 mg total) by mouth 4 (four) times daily as needed for diarrhea or loose stools.   meclizine 25 MG tablet Commonly known as: ANTIVERT Take 1 tablet (25 mg total) by mouth 3 (three) times daily as needed for dizziness.   metFORMIN 500 MG tablet Commonly known as: GLUCOPHAGE TAKE 2 TABLETS DAILY WITH BREAKFAST AND 1 TABLET DAILY WITH SUPPER. What changed:  how much to take how to take this when to take this additional instructions   montelukast 10 MG tablet Commonly known as: SINGULAIR Take 1 tablet (10 mg total) by mouth at bedtime.   multivitamin with minerals Tabs tablet Take 1 tablet by mouth daily.   ondansetron 4 MG tablet Commonly known as: Zofran Take 1 tablet (4 mg total) by mouth every 8 (eight) hours as needed  for nausea or vomiting.   Repatha SureClick 970 MG/ML Soaj Generic drug: Evolocumab Inject 140 mg into the skin every 14 (fourteen) days.   rosuvastatin 40 MG tablet Commonly known as: CRESTOR Take 1 tablet (40 mg total) by mouth daily.   triamcinolone cream 0.1 % Commonly known as: KENALOG APPLY TO AFFECTED AREA TWICE A DAY What changed: See the new instructions.               Durable Medical Equipment  (From admission, onward)           Start     Ordered   03/23/21 0906  For home use only DME Walker rolling  Once       Question Answer Comment  Walker: With 5 Inch Wheels   Patient needs a walker to treat with the following condition Weakness       03/23/21 0911            LABORATORY STUDIES CBC    Component Value Date/Time   WBC 10.2 03/23/2021 0343   RBC 4.22 03/23/2021 0343   HGB 13.0 03/23/2021 0343   HGB 14.7 11/07/2020 1508   HCT 37.8 03/23/2021 0343   HCT 41.5 11/07/2020 1508   PLT 244 03/23/2021 0343   PLT 245 11/07/2020 1508   MCV 89.6 03/23/2021 0343   MCV 90 11/07/2020 1508   MCH 30.8 03/23/2021 0343   MCHC 34.4 03/23/2021 0343   RDW 12.0 03/23/2021 0343   RDW 12.4 11/07/2020 1508   LYMPHSABS 4.4 (H) 03/21/2021 2132   LYMPHSABS 4.0 (H) 11/07/2020 1508   MONOABS 0.5 03/21/2021 2132   EOSABS 0.1 03/21/2021 2132   EOSABS 0.1 11/07/2020 1508   BASOSABS 0.1 03/21/2021 2132   BASOSABS 0.1 11/07/2020 1508   CMP    Component Value Date/Time   NA 137 03/23/2021 0343   NA 138 11/07/2020 1508   K 4.4 03/23/2021 0343   CL 104 03/23/2021 0343   CO2 24 03/23/2021 0343   GLUCOSE 152 (H) 03/23/2021 0343   BUN 15 03/23/2021 0343   BUN 14 11/07/2020 1508   CREATININE 0.92 03/23/2021 0343   CREATININE 0.63 02/04/2013 1200   CALCIUM 8.9 03/23/2021 0343   PROT 6.6 03/21/2021 2132   PROT 6.6 11/07/2020 1508   ALBUMIN 3.7 03/21/2021 2132   ALBUMIN 4.4 11/07/2020 1508   AST 16 03/21/2021 2132   ALT 10 03/21/2021 2132   ALKPHOS 96 03/21/2021 2132   BILITOT 0.4 03/21/2021 2132   BILITOT 0.3 11/07/2020 1508   GFRNONAA >60 03/23/2021 0343   GFRAA 112 06/29/2019 1404   COAGS Lab Results  Component Value Date   INR 1.0 03/21/2021   Lipid Panel    Component Value Date/Time   CHOL 261 (H) 03/22/2021 0107   CHOL 263 (H) 11/07/2020 1508   TRIG 288 (H) 03/22/2021 0107   HDL 41 03/22/2021 0107   HDL 31 (L) 11/07/2020 1508   CHOLHDL 6.4 03/22/2021 0107   VLDL 58 (H) 03/22/2021 0107   LDLCALC 162 (H) 03/22/2021 0107   LDLCALC 154 (H) 11/07/2020 1508   HgbA1C  Lab Results  Component Value Date   HGBA1C 6.1 (H) 03/22/2021   Urinalysis No results found for: COLORURINE, APPEARANCEUR, LABSPEC, PHURINE,  GLUCOSEU, HGBUR, BILIRUBINUR, KETONESUR, PROTEINUR, UROBILINOGEN, NITRITE, LEUKOCYTESUR Urine Drug Screen No results found for: LABOPIA, COCAINSCRNUR, LABBENZ, AMPHETMU, THCU, LABBARB  Alcohol Level    Component Value Date/Time   [facility] <10 03/21/2021 2132     SIGNIFICANT DIAGNOSTIC STUDIES MR  BRAIN WO CONTRAST  Result Date: 03/23/2021 CLINICAL DATA:  Initial evaluation for neuro deficit, stroke suspected. EXAM: MRI HEAD WITHOUT CONTRAST TECHNIQUE: Multiplanar, multiecho pulse sequences of the brain and surrounding structures were obtained without intravenous contrast. COMPARISON:  Prior CT and CTA from 03/21/2021. FINDINGS: Brain: Examination degraded by motion artifact. Cerebral volume within normal limits. Few small remote right cerebellar infarcts noted. Mild scattered patchy T2/FLAIR signal abnormality seen involving the periventricular and deep white matter both cerebral hemispheres, nonspecific, but most commonly related to chronic microvascular ischemic disease. Mild patchy involvement of the pons. Overall, appearance is mild in nature. Curvilinear focus of restricted diffusion seen involving the posterior right basal ganglia, extending from the right lentiform nucleus towards the right periatrial white matter, consistent with an acute ischemic infarct (series 3, image 25). This measures up to approximately 2 cm in length. In additional smaller curvilinear focus of diffusion abnormality involving the lateral margin of the contralateral left lentiform nucleus measures up to 1.2 cm, also consistent with an acute ischemic infarct (series 3, image 28). No associated hemorrhage or mass effect. Gray-white matter differentiation otherwise maintained. No other areas of chronic cortical infarction. No acute or chronic intracranial blood products. No mass lesion or midline shift. Diffuse ventricular prominence, most pronounced at the lateral and third ventricles, out of proportion to cortical sulcation.  Finding is nonspecific, but could reflect a degree of NPH. Periventricular FLAIR signal intensity may in part reflect associated transependymal flow of CSF. No extra-axial fluid collection. Pituitary gland suprasellar region within normal limits. Midline structures intact. Vascular: Major intracranial vascular flow voids are maintained. Skull and upper cervical spine: Craniocervical junction within normal limits. Bone marrow signal intensity normal. No scalp soft tissue abnormality. Sinuses/Orbits: Globes and orbital soft tissues within normal limits. Paranasal sinuses are largely clear. Trace left mastoid effusion noted, of doubtful significance. Other: None. IMPRESSION: 1. Acute ischemic infarcts involving the bilateral basal ganglia as above, right larger than left. No associated hemorrhage or mass effect. 2. Diffuse ventricular prominence, out of proportion to cortical sulcation. Finding is nonspecific, but could reflect changes of NPH in the correct clinical setting. No visible structural abnormality about the cerebral aqueduct. 3. Few small remote right cerebellar infarcts. 4. Underlying mild chronic microvascular ischemic disease. Electronically Signed   By: Jeannine Boga M.D.   On: 03/23/2021 01:48   ECHOCARDIOGRAM COMPLETE  Result Date: 03/22/2021    ECHOCARDIOGRAM REPORT   Patient Name:   Hannah Mcdaniel Date of Exam: 03/22/2021 Medical Rec #:  [id number]        Height:       61.0 in Accession #:    [id number]       Weight:       142.0 lb Date of Birth:  1965-11-13        BSA:          1.633 m Patient Age:    17 years         BP:           170/78 mmHg Patient Gender: F                HR:           66 bpm. Exam Location:  Inpatient Procedure: 2D Echo Indications:    Stroke  History:        Patient has no prior history of Echocardiogram examinations.                 Risk Factors:Dyslipidemia and  Diabetes.  Sonographer:    Arlyss Gandy Referring Phys: [id number] [place]  1. Left  ventricular ejection fraction, by estimation, is 55%. The left ventricle has normal function. The left ventricle has no regional wall motion abnormalities. There is mild left ventricular hypertrophy. Left ventricular diastolic parameters are consistent with Grade I diastolic dysfunction (impaired relaxation).  2. Right ventricular systolic function is normal. The right ventricular size is normal. Tricuspid regurgitation signal is inadequate for assessing PA pressure.  3. Left atrial size was mildly dilated.  4. The mitral valve is normal in structure. Trivial mitral valve regurgitation. No evidence of mitral stenosis.  5. The aortic valve is tricuspid. Aortic valve regurgitation is not visualized. No aortic stenosis is present.  6. The inferior vena cava is normal in size with greater than 50% respiratory variability, suggesting right atrial pressure of 3 mmHg. FINDINGS  Left Ventricle: Left ventricular ejection fraction, by estimation, is 55%. The left ventricle has normal function. The left ventricle has no regional wall motion abnormalities. The left ventricular internal cavity size was normal in size. There is mild left ventricular hypertrophy. Left ventricular diastolic parameters are consistent with Grade I diastolic dysfunction (impaired relaxation). Right Ventricle: The right ventricular size is normal. No increase in right ventricular wall thickness. Right ventricular systolic function is normal. Tricuspid regurgitation signal is inadequate for assessing PA pressure. Left Atrium: Left atrial size was mildly dilated. Right Atrium: Right atrial size was normal in size. Pericardium: There is no evidence of pericardial effusion. Mitral Valve: The mitral valve is normal in structure. Trivial mitral valve regurgitation. No evidence of mitral valve stenosis. Tricuspid Valve: The tricuspid valve is normal in structure. Tricuspid valve regurgitation is trivial. Aortic Valve: The aortic valve is tricuspid. Aortic  valve regurgitation is not visualized. No aortic stenosis is present. Aortic valve mean gradient measures 4.0 mmHg. Aortic valve peak gradient measures 7.5 mmHg. Aortic valve area, by VTI measures 1.99 cm. Pulmonic Valve: The pulmonic valve was normal in structure. Pulmonic valve regurgitation is not visualized. Aorta: The aortic root is normal in size and structure. Venous: The inferior vena cava is normal in size with greater than 50% respiratory variability, suggesting right atrial pressure of 3 mmHg. IAS/Shunts: No atrial level shunt detected by color flow Doppler.  LEFT VENTRICLE PLAX 2D LVIDd:         3.90 cm   Diastology LVIDs:         2.80 cm   LV e' medial:    5.22 cm/s LV PW:         0.90 cm   LV E/e' medial:  12.6 LV IVS:        0.90 cm   LV e' lateral:   5.33 cm/s LVOT diam:     2.00 cm   LV E/e' lateral: 12.3 LV SV:         67 LV SV Index:   41 LVOT Area:     3.14 cm  RIGHT VENTRICLE            IVC RV Basal diam:  3.40 cm    IVC diam: 1.70 cm RV Mid diam:    2.80 cm RV S prime:     9.36 cm/s TAPSE (M-mode): 1.8 cm LEFT ATRIUM             Index        RIGHT ATRIUM           Index LA diam:  4.10 cm 2.51 cm/m   RA Area:     12.60 cm LA Vol (A2C):   62.1 ml 38.03 ml/m  RA Volume:   28.20 ml  17.27 ml/m LA Vol (A4C):   45.1 ml 27.62 ml/m LA Biplane Vol: 54.9 ml 33.62 ml/m  AORTIC VALVE AV Area (Vmax):    2.32 cm AV Area (Vmean):   1.89 cm AV Area (VTI):     1.99 cm AV Vmax:           137.00 cm/s AV Vmean:          97.300 cm/s AV VTI:            0.337 m AV Peak Grad:      7.5 mmHg AV Mean Grad:      4.0 mmHg LVOT Vmax:         101.00 cm/s LVOT Vmean:        58.600 cm/s LVOT VTI:          0.213 m LVOT/AV VTI ratio: 0.63  AORTA Ao Root diam: 2.20 cm Ao Asc diam:  2.90 cm MITRAL VALVE MV Area (PHT): 2.74 cm    SHUNTS MV Decel Time: 277 msec    Systemic VTI:  0.21 m MV E velocity: 65.60 cm/s  Systemic Diam: 2.00 cm MV A velocity: 86.10 cm/s MV E/A ratio:  0.76 Hannah McleanMD Electronically  signed by Franki Monte Signature Date/Time: 03/22/2021/2:38:33 PM    Final    CT HEAD CODE STROKE WO CONTRAST  Result Date: 03/21/2021 CLINICAL DATA:  Code stroke.  Acute neurologic deficit EXAM: CT HEAD WITHOUT CONTRAST TECHNIQUE: Contiguous axial images were obtained from the base of the skull through the vertex without intravenous contrast. RADIATION DOSE REDUCTION: This exam was performed according to the departmental dose-optimization program which includes automated exposure control, adjustment of the mA and/or kV according to patient size and/or use of iterative reconstruction technique. COMPARISON:  None. FINDINGS: Brain: There is no mass, hemorrhage or extra-axial collection. Markedly enlarged lateral and third ventricles without a clear source of obstruction. There is cortical hypoattenuation in the right MCA territory. Vascular: No abnormal hyperdensity of the major intracranial arteries or dural venous sinuses. No intracranial atherosclerosis. Skull: The visualized skull base, calvarium and extracranial soft tissues are normal. Sinuses/Orbits: No fluid levels or advanced mucosal thickening of the visualized paranasal sinuses. No mastoid or middle ear effusion. The orbits are normal. ASPECTS [facility] Stroke Program Early CT Score) - Ganglionic level infarction (caudate, lentiform nuclei, internal capsule, insula, M1-M3 cortex): 7 - Supraganglionic infarction (M4-M6 cortex): 2 Total score (0-10 with 10 being normal): 9 IMPRESSION: 1. Early changes of likely right MCA small acute infarct. 2. Markedly enlarged lateral and third ventricles without a clear source of obstruction. This could indicate aqua ductal stenosis. 3. ASPECTS is 9. These results were called by telephone at the time of interpretation on 03/21/2021 at 9:47 pm to provider [facility], who verbally acknowledged these results. Electronically Signed   By: Ulyses Jarred M.D.   On: 03/21/2021 21:48   CT ANGIO HEAD NECK W WO CM (CODE  STROKE)  Result Date: 03/21/2021 CLINICAL DATA:  Code stroke EXAM: CT ANGIOGRAPHY HEAD AND NECK TECHNIQUE: Multidetector CT imaging of the head and neck was performed using the standard protocol during bolus administration of intravenous contrast. Multiplanar CT image reconstructions and MIPs were obtained to evaluate the vascular anatomy. Carotid stenosis measurements (when applicable) are obtained utilizing NASCET criteria, using the distal internal carotid diameter as  the denominator. RADIATION DOSE REDUCTION: This exam was performed according to the departmental dose-optimization program which includes automated exposure control, adjustment of the mA and/or kV according to patient size and/or use of iterative reconstruction technique. CONTRAST:  71m OMNIPAQUE IOHEXOL 350 MG/ML SOLN COMPARISON:  None. FINDINGS: CTA NECK FINDINGS SKELETON: There is no bony spinal canal stenosis. No lytic or blastic lesion. OTHER NECK: Normal pharynx, larynx and major salivary glands. No cervical lymphadenopathy. Unremarkable thyroid gland. UPPER CHEST: No pneumothorax or pleural effusion. No nodules or masses. AORTIC ARCH: There is calcific atherosclerosis of the aortic arch. There is no aneurysm, dissection or hemodynamically significant stenosis of the visualized portion of the aorta. Conventional 3 vessel aortic branching pattern. The visualized proximal subclavian arteries are widely patent. RIGHT CAROTID SYSTEM: Normal without aneurysm, dissection or stenosis. LEFT CAROTID SYSTEM: Normal without aneurysm, dissection or stenosis. VERTEBRAL ARTERIES: Left dominant configuration. Both origins are clearly patent. There is no dissection, occlusion or flow-limiting stenosis to the skull base (V1-V3 segments). CTA HEAD FINDINGS POSTERIOR CIRCULATION: --Vertebral arteries: Normal V4 segments. --Inferior cerebellar arteries: Normal. --Basilar artery: Normal. --Superior cerebellar arteries: Normal. --Posterior cerebral arteries  (PCA): Normal. ANTERIOR CIRCULATION: --Intracranial internal carotid arteries: Normal. --Anterior cerebral arteries (ACA): Normal. Both A1 segments are present. Patent anterior communicating artery (a-comm). --Middle cerebral arteries (MCA): Normal. VENOUS SINUSES: As permitted by contrast timing, patent. ANATOMIC VARIANTS: None Review of the MIP images confirms the above findings. IMPRESSION: 1. No emergent large vessel occlusion or high-grade stenosis of the intracranial arteries. Aortic Atherosclerosis (ICD10-I70.0). Electronically Signed   By: KUlyses JarredM.D.   On: 03/21/2021 22:27      HISTORY OF PRESENT ILLNESS Hannah GEDDISis a 56y.o. female with PMH significant for diabetes, hyperlipidemia, migraine, GERD, smoker with attempting to cut down, peripheral vascular disease on Plavix who presents with sudden onset left leg weakness and decreased sensation in left arm and left leg.  Patient reports that she was sitting in the bathtub when this started.  EMS arrived and noted that patient had decreased left hand grip strength and was dragging her left leg.  She also has numbness in her entire left arm and her left leg. She denies any pain, did not feel like she pinched a nerve.  No prior history of similar symptoms.  LKW: 1900 on 03/21/21 mRS: 0 tNKASE: yes. I discussed risks and benefits of tNKASE with her husband listening in over the phone. There was some delay in tNKASE administration as she was hypertensive and had to be treated with Labetalol IV. No prior history of ICH or significant GI bleed, no recent surgery, no recent trauma, no recent MI, she is not on anticoagulation, no history of CNS met. Thrombectomy: Not offered due to no LVO.   HOSPITAL COURSE Ms. Hannah BRYSis a 56y.o. female with history of diabetes, hyperlipidemia, migraine, GERD, smoker with attempting to cut down, peripheral vascular disease on Plavix  presenting with sudden onset left leg weakness and decreased  sensation in left arm and left leg s/p tNKASE   Stroke:  b/l BG small infarcts likely secondary to synchronized small vessel disease given multiple uncontrolled risk factors Code Stroke CT head early changes of likely acute R MCA infarct. Markedly enlarged lateral and third ventricles. ASPECTS 9. CTA head & neck No large vessel occlusion or stenosis. MRI b/l BG small infarcts 2D Echo EF 55% Given b/l location, will do 30 day cardiac event monitoring to rule out afb as outpt.  LDL 162 HgbA1c 6.1 VTE prophylaxis - SCDs aspirin 81 mg daily and clopidogrel 75 mg daily prior to admission, now resumed home DAPT. Continue on discharge.  Therapy recommendations:  outpt PT Disposition:  home today   Ventriculomegaly of lateral and third ventricles Asx, no sx of NPH reported MRI no signs of aqueduct stenosis.  Follow-up with outpatient neurology   Hypertension Home meds:  N/A On the high end. However pt reported BP at home normal range Long-term BP goal normotensive Close PCP follow up   Hyperlipidemia Home meds:  Fenofibrate 135 mg, Zetia 10 mg, Rosuvastatin 40 mg, and repatha LDL 162, goal < 70 On fenofibrate, zetial and crestor 40 High intensity statin resumed Continue statin at discharge Consider to refer to lipid clinic, pt is considering and will talk to PCP   Diabetes type II Controlled Home meds:  Metformin 500 mg 2 tablets AM and 1 tablet PM HgbA1c 6.1, goal < 7.0 CBGs SSI carb modified diet Resume home metformin on d/c  Tobacco abuse Current smoker Smoking cessation counseling provided Pt is willing to quit    Other Stroke Risk Factors Family hx stroke (Mother, Maternal grandmother) Arterial occlusive disease and claudication of RLE Migraines   Other Active Problems Migraines  Home med: Sumatriptan 100 mg Advised to discontinue imitrax in the future with concern for stroke.    DISCHARGE EXAM Blood pressure 96/75, pulse 72, temperature 98.1 F (36.7 C),  temperature source Oral, resp. rate 18, weight 64.4 kg, SpO2 97 %.  General - Well nourished, well developed, age congruent woman in no apparent distress.   Cardiovascular - Regular rhythm and rate on telemetry.   Mental Status -  Level of arousal and orientation to time, place, and person were intact. Language including expression, naming, repetition, comprehension was assessed and found intact. Attention span and concentration were normal. Recent and remote memory were intact. 2/3 word recall Fund of Knowledge was assessed and was intact. Able to name 12 animals that walk on 4 legs.    Cranial Nerves II - XII - II - Visual field intact OU. III, IV, VI - Extraocular movements intact. V - Facial sensation intact bilaterally. VII - Facial movement intact bilaterally. VIII - Hearing & vestibular intact bilaterally. X - Palate elevates symmetrically. XI - Chin turning & shoulder shrug intact bilaterally. XII - Tongue protrusion intact.   Motor Strength - The patients strength was normal in all extremities and pronator drift was absent.  Bulk was normal and fasciculations were absent.   Motor Tone - Muscle tone was assessed at the neck and appendages and was normal.   Sensory - Light touch was  assessed and symmetrical.     Coordination - intact bilaterally. Tremor was absent.   Gait and Station - not tested  Discharge Diet       Diet   Diet Carb Modified Fluid consistency: Thin; Room service appropriate? Yes   liquids  DISCHARGE PLAN Disposition:  home today aspirin 81 mg daily and clopidogrel 75 mg daily for secondary PVD and stroke prevention. Ongoing stroke risk factor control by Primary Care Physician at time of discharge Follow-up PCP [name], FNP in 2 weeks. Follow-up in [facility] in 4 weeks, office to schedule an appointment.   35 minutes were spent preparing discharge.  Rosalin Hawking, MD PhD Stroke  Neurology 03/23/2021 11:55 AM

## 2021-03-23 NOTE — Progress Notes (Signed)
Occupational Therapy Treatment and Discharge Patient Details Name: Hannah Mcdaniel MRN: [id number] DOB: Jan 14, 1966 Today's Date: 03/23/2021   History of present illness 56 y.o. female presents to [place] on 03/21/2021 with sudden onset LLE weakness and L sided sensation changes. MRI: acute ischemic infarcts involving the bilateral basal ganglia, right larger than left. No associated hemorrhage or mass  effect. Few small remote right cerebellar infarcts.Pt received tNK. PMH includes DMII, HLD, migraine, GERD, PVD.   OT comments  This 56 yo female seen today to address safety when up and about. Today she is "catching" her left foot intermittently when she ambulates without an AD--does much better with RW. Made CM aware of the need for RW and pt would benefit from OPOT due to this deficit since this is not her baseline. Pt and husband report extended family can be with patient during the day while husband works. Pt is able to complete basic ADLs at an overall S level from RW level. No further OT needs, we will sign off.   Recommendations for follow up therapy are one component of a multi-disciplinary discharge planning process, led by the attending physician.  Recommendations may be updated based on patient status, additional functional criteria and insurance authorization.    Follow Up Recommendations  No OT follow up    Assistance Recommended at Discharge Set up Supervision/Assistance  Patient can return home with the following  Assistance with cooking/housework (S with stepping into and out of shower stall)   Equipment Recommendations  Other (comment) (RW)       Precautions / Restrictions Precautions Precautions: Fall Restrictions Weight Bearing Restrictions: No       Mobility Bed Mobility Overal bed mobility: Independent                  Transfers Overall transfer level: Needs assistance Equipment used: Rolling walker (2 wheels) Transfers: Sit to/from Stand Sit to  Stand: Supervision     Step pivot transfers: Supervision     General transfer comment: As long as she has RW she is at a S level, without it she was min guard A     Balance Overall balance assessment: Needs assistance Sitting-balance support: No upper extremity supported Sitting balance-Leahy Scale: Good     Standing balance support: No upper extremity supported, During functional activity Standing balance-Leahy Scale: Good                             ADL either performed or assessed with clinical judgement   ADL                                         General ADL Comments: Overall at a S level due to LLE wanting to catch on floor when she ambulates (better with RW). We discussed her concentrating on stepping with her LLE espcially in relation to stepping over into walk in shower.    Extremity/Trunk Assessment Upper Extremity Assessment Upper Extremity Assessment: Overall WFL for tasks assessed            Vision Baseline Vision/History: 1 Wears glasses Ability to See in Adequate Light: 0 Adequate Patient Visual Report: No change from baseline Additional Comments: peripherial fields tested due to a couple of times she slightly ran into items on her left with RW--with gross testing no issues seen with perpherial vision  Cognition Arousal/Alertness: Awake/alert Behavior During Therapy: WFL for tasks assessed/performed Overall Cognitive Status: Within Functional Limits for tasks assessed                                                     Pertinent Vitals/ Pain       Pain Assessment Pain Assessment: No/denies pain         Frequency  Min 2X/week        Progress Toward Goals  OT Goals(current goals can now be found in the care plan section)  Progress towards OT goals:  (all education completed)  Acute Rehab OT Goals Patient Stated Goal: to go home today OT Goal Formulation: With patient Time For  Goal Achievement: 04/05/21 Potential to Achieve Goals: Good  Plan Discharge plan remains appropriate;Equipment recommendations need to be updated       AM-PAC OT "6 Clicks" Daily Activity     Outcome Measure   Help from another person eating meals?: None Help from another person taking care of personal grooming?: A Little (S) Help from another person toileting, which includes using toliet, bedpan, or urinal?: A Little (S) Help from another person bathing (including washing, rinsing, drying)?: A Little (S) Help from another person to put on and taking off regular upper body clothing?: A Little (S) Help from another person to put on and taking off regular lower body clothing?: A Little (S) 6 Click Score: 19    End of Session    OT Visit Diagnosis: Muscle weakness (generalized) (M62.81)   Activity Tolerance Patient tolerated treatment well   Patient Left in chair;with call bell/phone within reach;with chair alarm set   Nurse Communication Mobility status        Time: 1601-0932 OT Time Calculation (min): 22 min  Charges: OT General Charges $OT Visit: 1 Visit OT Treatments $Self Care/Home Management : 8-22 mins  [place], OTR/L Acute Rehab Services Pager [phone] Office [phone]    Almon Register 03/23/2021, 9:08 AM

## 2021-03-23 NOTE — Plan of Care (Signed)
Pt to be d/c home today

## 2021-03-23 NOTE — Progress Notes (Signed)
Pt reports not valuables here at hospital.

## 2021-03-23 NOTE — TOC CM/SW Note (Signed)
Made aware by PT that patient will need RW and outpatient PT. Referral made for outpatient PT and RW requested to be delivered to room. Spoke with [place] with [employer].   Spoke with patient's husband to make aware.    Marthenia Rolling, MSN, RN,BSN Inpatient [facility] Case Manager [phone]

## 2021-03-23 NOTE — Plan of Care (Signed)
D/C HOME

## 2021-03-25 ENCOUNTER — Telehealth: Payer: Self-pay | Admitting: Family Medicine

## 2021-03-25 ENCOUNTER — Other Ambulatory Visit: Payer: Self-pay

## 2021-03-25 ENCOUNTER — Emergency Department (HOSPITAL_COMMUNITY): Payer: 59

## 2021-03-25 ENCOUNTER — Telehealth: Payer: Self-pay

## 2021-03-25 ENCOUNTER — Other Ambulatory Visit: Payer: Self-pay | Admitting: Student

## 2021-03-25 ENCOUNTER — Inpatient Hospital Stay (HOSPITAL_COMMUNITY)
Admission: EM | Admit: 2021-03-25 | Discharge: 2021-03-27 | DRG: 065 | Disposition: A | Discharge status: Home / Self Care | Payer: 59 | Attending: Internal Medicine | Admitting: Internal Medicine

## 2021-03-25 ENCOUNTER — Encounter (HOSPITAL_COMMUNITY): Payer: Self-pay | Admitting: Emergency Medicine

## 2021-03-25 DIAGNOSIS — F418 Other specified anxiety disorders: Secondary | ICD-10-CM | POA: Diagnosis present

## 2021-03-25 DIAGNOSIS — E1151 Type 2 diabetes mellitus with diabetic peripheral angiopathy without gangrene: Secondary | ICD-10-CM | POA: Diagnosis present

## 2021-03-25 DIAGNOSIS — I639 Cerebral infarction, unspecified: Secondary | ICD-10-CM

## 2021-03-25 DIAGNOSIS — R29898 Other symptoms and signs involving the musculoskeletal system: Secondary | ICD-10-CM | POA: Diagnosis not present

## 2021-03-25 DIAGNOSIS — Z833 Family history of diabetes mellitus: Secondary | ICD-10-CM

## 2021-03-25 DIAGNOSIS — Z886 Allergy status to analgesic agent status: Secondary | ICD-10-CM

## 2021-03-25 DIAGNOSIS — G8194 Hemiplegia, unspecified affecting left nondominant side: Secondary | ICD-10-CM | POA: Diagnosis present

## 2021-03-25 DIAGNOSIS — Z7902 Long term (current) use of antithrombotics/antiplatelets: Secondary | ICD-10-CM

## 2021-03-25 DIAGNOSIS — Z880 Allergy status to penicillin: Secondary | ICD-10-CM

## 2021-03-25 DIAGNOSIS — Z888 Allergy status to other drugs, medicaments and biological substances status: Secondary | ICD-10-CM

## 2021-03-25 DIAGNOSIS — M1990 Unspecified osteoarthritis, unspecified site: Secondary | ICD-10-CM | POA: Diagnosis present

## 2021-03-25 DIAGNOSIS — Z8249 Family history of ischemic heart disease and other diseases of the circulatory system: Secondary | ICD-10-CM

## 2021-03-25 DIAGNOSIS — R29705 NIHSS score 5: Secondary | ICD-10-CM | POA: Diagnosis present

## 2021-03-25 DIAGNOSIS — E1169 Type 2 diabetes mellitus with other specified complication: Secondary | ICD-10-CM | POA: Diagnosis present

## 2021-03-25 DIAGNOSIS — R200 Anesthesia of skin: Secondary | ICD-10-CM | POA: Diagnosis present

## 2021-03-25 DIAGNOSIS — G43909 Migraine, unspecified, not intractable, without status migrainosus: Secondary | ICD-10-CM | POA: Diagnosis present

## 2021-03-25 DIAGNOSIS — Z8041 Family history of malignant neoplasm of ovary: Secondary | ICD-10-CM

## 2021-03-25 DIAGNOSIS — I10 Essential (primary) hypertension: Secondary | ICD-10-CM | POA: Diagnosis present

## 2021-03-25 DIAGNOSIS — Z79899 Other long term (current) drug therapy: Secondary | ICD-10-CM

## 2021-03-25 DIAGNOSIS — Z7984 Long term (current) use of oral hypoglycemic drugs: Secondary | ICD-10-CM

## 2021-03-25 DIAGNOSIS — Z8349 Family history of other endocrine, nutritional and metabolic diseases: Secondary | ICD-10-CM

## 2021-03-25 DIAGNOSIS — R531 Weakness: Secondary | ICD-10-CM | POA: Diagnosis not present

## 2021-03-25 DIAGNOSIS — Z20822 Contact with and (suspected) exposure to covid-19: Secondary | ICD-10-CM | POA: Diagnosis present

## 2021-03-25 DIAGNOSIS — I6350 Cerebral infarction due to unspecified occlusion or stenosis of unspecified cerebral artery: Principal | ICD-10-CM | POA: Diagnosis present

## 2021-03-25 DIAGNOSIS — Z803 Family history of malignant neoplasm of breast: Secondary | ICD-10-CM

## 2021-03-25 DIAGNOSIS — Z818 Family history of other mental and behavioral disorders: Secondary | ICD-10-CM

## 2021-03-25 DIAGNOSIS — Z825 Family history of asthma and other chronic lower respiratory diseases: Secondary | ICD-10-CM

## 2021-03-25 DIAGNOSIS — Z95820 Peripheral vascular angioplasty status with implants and grafts: Secondary | ICD-10-CM

## 2021-03-25 DIAGNOSIS — F32A Depression, unspecified: Secondary | ICD-10-CM | POA: Diagnosis present

## 2021-03-25 DIAGNOSIS — F17210 Nicotine dependence, cigarettes, uncomplicated: Secondary | ICD-10-CM | POA: Diagnosis present

## 2021-03-25 DIAGNOSIS — M6281 Muscle weakness (generalized): Secondary | ICD-10-CM | POA: Diagnosis not present

## 2021-03-25 DIAGNOSIS — Z801 Family history of malignant neoplasm of trachea, bronchus and lung: Secondary | ICD-10-CM

## 2021-03-25 DIAGNOSIS — I739 Peripheral vascular disease, unspecified: Secondary | ICD-10-CM | POA: Diagnosis present

## 2021-03-25 DIAGNOSIS — F419 Anxiety disorder, unspecified: Secondary | ICD-10-CM | POA: Diagnosis present

## 2021-03-25 DIAGNOSIS — Z806 Family history of leukemia: Secondary | ICD-10-CM

## 2021-03-25 DIAGNOSIS — E785 Hyperlipidemia, unspecified: Secondary | ICD-10-CM | POA: Diagnosis present

## 2021-03-25 DIAGNOSIS — K219 Gastro-esophageal reflux disease without esophagitis: Secondary | ICD-10-CM | POA: Diagnosis present

## 2021-03-25 DIAGNOSIS — Z86010 Personal history of colonic polyps: Secondary | ICD-10-CM

## 2021-03-25 DIAGNOSIS — I70221 Atherosclerosis of native arteries of extremities with rest pain, right leg: Secondary | ICD-10-CM | POA: Diagnosis present

## 2021-03-25 DIAGNOSIS — Z7982 Long term (current) use of aspirin: Secondary | ICD-10-CM

## 2021-03-25 DIAGNOSIS — Z823 Family history of stroke: Secondary | ICD-10-CM

## 2021-03-25 DIAGNOSIS — Z885 Allergy status to narcotic agent status: Secondary | ICD-10-CM

## 2021-03-25 LAB — URINALYSIS, ROUTINE W REFLEX MICROSCOPIC
Bilirubin Urine: NEGATIVE
Glucose, UA: NEGATIVE mg/dL
Hgb urine dipstick: NEGATIVE
Ketones, ur: NEGATIVE mg/dL
Leukocytes,Ua: NEGATIVE
Nitrite: NEGATIVE
Protein, ur: NEGATIVE mg/dL
Specific Gravity, Urine: 1.019 (ref 1.005–1.030)
pH: 6 (ref 5.0–8.0)

## 2021-03-25 LAB — CBC WITH DIFFERENTIAL/PLATELET
Abs Immature Granulocytes: 0.02 10*3/uL (ref 0.00–0.07)
Basophils Absolute: 0.1 10*3/uL (ref 0.0–0.1)
Basophils Relative: 1 %
Eosinophils Absolute: 0.1 10*3/uL (ref 0.0–0.5)
Eosinophils Relative: 1 %
HCT: 39.6 % (ref 36.0–46.0)
Hemoglobin: 13.1 g/dL (ref 12.0–15.0)
Immature Granulocytes: 0 %
Lymphocytes Relative: 34 %
Lymphs Abs: 3 10*3/uL (ref 0.7–4.0)
MCH: 30.1 pg (ref 26.0–34.0)
MCHC: 33.1 g/dL (ref 30.0–36.0)
MCV: 91 fL (ref 80.0–100.0)
Monocytes Absolute: 0.7 10*3/uL (ref 0.1–1.0)
Monocytes Relative: 7 %
Neutro Abs: 5 10*3/uL (ref 1.7–7.7)
Neutrophils Relative %: 57 %
Platelets: 262 10*3/uL (ref 150–400)
RBC: 4.35 MIL/uL (ref 3.87–5.11)
RDW: 11.9 % (ref 11.5–15.5)
WBC: 8.9 10*3/uL (ref 4.0–10.5)
nRBC: 0 % (ref 0.0–0.2)

## 2021-03-25 LAB — GLUCOSE, CAPILLARY: Glucose-Capillary: 172 mg/dL — ABNORMAL HIGH (ref 70–99)

## 2021-03-25 LAB — COMPREHENSIVE METABOLIC PANEL
ALT: 10 U/L (ref 0–44)
AST: 14 U/L — ABNORMAL LOW (ref 15–41)
Albumin: 3.4 g/dL — ABNORMAL LOW (ref 3.5–5.0)
Alkaline Phosphatase: 95 U/L (ref 38–126)
Anion gap: 8 (ref 5–15)
BUN: 15 mg/dL (ref 6–20)
CO2: 28 mmol/L (ref 22–32)
Calcium: 9.2 mg/dL (ref 8.9–10.3)
Chloride: 103 mmol/L (ref 98–111)
Creatinine, Ser: 0.98 mg/dL (ref 0.44–1.00)
GFR, Estimated: >60 mL/min (ref >60)
Glucose, Bld: 101 mg/dL — ABNORMAL HIGH (ref 70–99)
Potassium: 3.9 mmol/L (ref 3.5–5.1)
Sodium: 139 mmol/L (ref 135–145)
Total Bilirubin: 0.5 mg/dL (ref 0.3–1.2)
Total Protein: 6.6 g/dL (ref 6.5–8.1)

## 2021-03-25 LAB — RESP PANEL BY RT-PCR (FLU A&B, COVID) ARPGX2
Influenza A by PCR: NEGATIVE
Influenza B by PCR: NEGATIVE
SARS Coronavirus 2 by RT PCR: NEGATIVE

## 2021-03-25 LAB — RAPID URINE DRUG SCREEN, HOSP PERFORMED
Amphetamines: NOT DETECTED
Barbiturates: NOT DETECTED
Benzodiazepines: NOT DETECTED
Cocaine: NOT DETECTED
Opiates: NOT DETECTED
Tetrahydrocannabinol: NOT DETECTED

## 2021-03-25 LAB — PROTIME-INR
INR: 1 (ref 0.8–1.2)
Prothrombin Time: 13.6 seconds (ref 11.4–15.2)

## 2021-03-25 MED ORDER — MONTELUKAST SODIUM 10 MG PO TABS
10.0000 mg | ORAL_TABLET | Freq: Every day | ORAL | Status: DC
  Administered 2021-03-25 – 2021-03-26 (×2): 10 mg via ORAL
  Filled 2021-03-25 (×2): qty 1

## 2021-03-25 MED ORDER — ROSUVASTATIN CALCIUM 20 MG PO TABS
40.0000 mg | ORAL_TABLET | Freq: Every day | ORAL | Status: DC
  Administered 2021-03-25 – 2021-03-27 (×3): 40 mg via ORAL
  Filled 2021-03-25 (×3): qty 2

## 2021-03-25 MED ORDER — CLOPIDOGREL BISULFATE 75 MG PO TABS
75.0000 mg | ORAL_TABLET | Freq: Every day | ORAL | Status: DC
  Administered 2021-03-26: 75 mg via ORAL
  Filled 2021-03-25: qty 1

## 2021-03-25 MED ORDER — ACETAMINOPHEN 325 MG PO TABS: 650.0000 mg | ORAL_TABLET | ORAL | Status: DC | PRN

## 2021-03-25 MED ORDER — ACETAMINOPHEN 650 MG RE SUPP: 650.0000 mg | RECTAL | Status: DC | PRN

## 2021-03-25 MED ORDER — ACETAMINOPHEN 160 MG/5ML PO SOLN: 650.0000 mg | ORAL | Status: DC | PRN

## 2021-03-25 MED ORDER — INSULIN ASPART 100 UNIT/ML IJ SOLN: 0.0000 [IU] | Freq: Three times a day (TID) | INTRAMUSCULAR | Status: DC

## 2021-03-25 MED ORDER — EZETIMIBE 10 MG PO TABS
10.0000 mg | ORAL_TABLET | Freq: Every day | ORAL | Status: DC
  Administered 2021-03-25 – 2021-03-27 (×3): 10 mg via ORAL
  Filled 2021-03-25 (×3): qty 1

## 2021-03-25 MED ORDER — FLUOXETINE HCL 20 MG PO CAPS
40.0000 mg | ORAL_CAPSULE | Freq: Every day | ORAL | Status: DC
  Administered 2021-03-25 – 2021-03-27 (×3): 40 mg via ORAL
  Filled 2021-03-25 (×3): qty 2

## 2021-03-25 MED ORDER — SENNOSIDES-DOCUSATE SODIUM 8.6-50 MG PO TABS: 1.0000 | ORAL_TABLET | Freq: Every evening | ORAL | Status: DC | PRN

## 2021-03-25 MED ORDER — ASPIRIN EC 81 MG PO TBEC
81.0000 mg | DELAYED_RELEASE_TABLET | Freq: Every day | ORAL | Status: DC
  Administered 2021-03-25 – 2021-03-27 (×3): 81 mg via ORAL
  Filled 2021-03-25 (×3): qty 1

## 2021-03-25 MED ORDER — LABETALOL HCL 5 MG/ML IV SOLN: 10.0000 mg | INTRAVENOUS | Status: DC | PRN

## 2021-03-25 MED ORDER — STROKE: EARLY STAGES OF RECOVERY BOOK
Freq: Once | Status: AC
  Filled 2021-03-25 (×2): qty 1

## 2021-03-25 NOTE — Assessment & Plan Note (Signed)
Continue Prozac

## 2021-03-25 NOTE — Consult Note (Addendum)
Ne[facility] [place] MR# [id number] 03/25/2021  Reason for Consult: Left sided weakness Consulting Provider: Marcello Fennel MD  CC: Left sided numbness and weakness  History is obtained from: patient and chart.  HPI: Hannah Mcdaniel is a 56 y.o. female with a PMHx of recent stroke s/p TNK (admitted 03/21/21, d/c 03/23/21 on ASA and Plavix), T2DM, HLD, migraine, GERD, smoking, PVD presents to [facility] due to sudden onset left sided numbness and weakness. Patient reports symptoms began when she woke up this morning when she fell out of her bed trying to get up. Patient's grandson had to help patient up. Patient reports she was still able to somewhat ambulate with walker even with weakness and numbness but did fall a couple more times. Patient reports these symptoms were identical to the ones she had during her 1st stroke on 2/16. Patient denies any LOC, headache or prodromal symptoms. Patient denies any chest palpitations, chest pain, nausea, vomiting, or dizziness. Patient reports being compliant with ASA+Plavix and her other prescribed medications. Patient was prompted by daughter to go to the ED for concerns of stroke recurrence.  Patient has not had a chance to get a 30 day event monitor and was told she would only be able to get one in end of March.   LKW: 03/25/21 6:00 AM tNK given: No outside of window and recent CVA IR Thrombectomy No, no LVO symptoms Modified Rankin Scale: 3-Moderate disability-requires help but walks WITHOUT assistance NIHSS: 5, 2 Left Arm, 2 Left Leg, 1 sensory  ROS: A complete ROS was performed and is negative except as noted in the HPI.   Past Medical History:  Diagnosis Date   Anxiety    Arterial occlusive disease Nov. 2014   Arthritis    Claudication of lower extremity ([place]) Nov. 2014   Right Lower Extremity rest pain   Colon polyps    Depression    Diabetes mellitus without complication ([place])    GERD (gastroesophageal reflux disease)     Hyperlipidemia    Migraines    Vertigo      Family History  Problem Relation Age of Onset   Diabetes Mother    Hyperlipidemia Mother    Hypertension Mother    Varicose Veins Mother    COPD Mother    Stroke Mother    Anxiety disorder Mother    Depression Mother    Ovarian cancer Mother    Acute myelogenous leukemia Father    Diabetes Brother    Hypertension Brother    Hyperlipidemia Brother    Stroke Maternal Grandmother    Anxiety disorder Maternal Grandmother    Depression Maternal Grandmother    Breast cancer Maternal Grandmother    Diabetes Maternal Grandmother    Heart disease Maternal Grandfather    Lung cancer Paternal Grandfather    Other Brother        Blood disease   Anxiety disorder Daughter    Migraines Daughter    Migraines Daughter    Migraines Son    Seizures Son     Social History:  reports that she has been smoking cigarettes. She started smoking about 43 years ago. She has been smoking an average of .25 packs per day. She has never used smokeless tobacco. She reports current alcohol use of about 1.0 standard drink per week. She reports that she does not use drugs.   Prior to Admission medications   Medication Sig Start Date End Date Taking? Authorizing Provider  aspirin EC  81 MG tablet Take 81 mg by mouth daily.   Yes [provider]  clopidogrel (PLAVIX) 75 MG tablet Take 1 tablet (75 mg total) by mouth daily. 06/01/20  Yes Collins, Emma M, PA-C  Evolocumab (REPATHA SURECLICK) 786 MG/ML SOAJ Inject 140 mg into the skin every 14 (fourteen) days. 03/05/21  Yes Hendricks Limes F, FNP  ezetimibe (ZETIA) 10 MG tablet Take 1 tablet (10 mg total) by mouth daily. 11/07/20  Yes Hendricks Limes F, FNP  FLUoxetine (PROZAC) 40 MG capsule Take 1 capsule (40 mg total) by mouth daily. 11/07/20  Yes Hendricks Limes F, FNP  levocetirizine (XYZAL) 5 MG tablet TAKE 1 TABLET BY MOUTH EVERY DAY IN THE EVENING Patient taking differently: Take 5 mg by mouth every evening.  11/07/20  Yes Hendricks Limes F, FNP  loperamide (IMODIUM A-D) 2 MG tablet Take 1 tablet (2 mg total) by mouth 4 (four) times daily as needed for diarrhea or loose stools. 02/01/21  Yes Ivy Lynn, NP  meclizine (ANTIVERT) 25 MG tablet Take 1 tablet (25 mg total) by mouth 3 (three) times daily as needed for dizziness. 04/10/20  Yes Hendricks Limes F, FNP  metFORMIN (GLUCOPHAGE) 500 MG tablet TAKE 2 TABLETS DAILY WITH BREAKFAST AND 1 TABLET DAILY WITH SUPPER. Patient taking differently: Take 500-1,000 mg by mouth See admin instructions. 1000 mg in the morning 500 mg at dinner 11/07/20  Yes Hendricks Limes F, FNP  montelukast (SINGULAIR) 10 MG tablet Take 1 tablet (10 mg total) by mouth at bedtime. 11/07/20  Yes [name], FNP  ondansetron (ZOFRAN) 4 MG tablet Take 1 tablet (4 mg total) by mouth every 8 (eight) hours as needed for nausea or vomiting. 02/01/21  Yes Ivy Lynn, NP  rosuvastatin (CRESTOR) 40 MG tablet Take 1 tablet (40 mg total) by mouth daily. 11/07/20  Yes Hendricks Limes F, FNP  triamcinolone cream (KENALOG) 0.1 % APPLY TO AFFECTED AREA TWICE A DAY Patient taking differently: Apply 1 application topically 2 (two) times daily as needed (rash). 08/28/20  Yes Hendricks Limes F, FNP  buPROPion (WELLBUTRIN XL) 300 MG 24 hr tablet TAKE 1 TABLET (300 MG TOTAL) BY MOUTH DAILY. TAKE IN ADDITION TO 150 MG FOR A TOTAL OF 450 MG DAILY. Patient not taking: Reported on 03/25/2021 12/29/20   [name], FNP  Continuous Blood Gluc Receiver (FREESTYLE LIBRE 2 READER) DEVI 1 Device by Does not apply route continuous. 11/11/20   [name], FNP  Continuous Blood Gluc Sensor (FREESTYLE LIBRE 2 SENSOR) MISC 1 Device by Does not apply route every 14 (fourteen) days. 02/14/21   [name], FNP    Exam: Current vital signs: BP (!) 149/127    Pulse (!) 53    Temp 98.2 F (36.8 C) (Oral)    Resp 12    Ht 5\' 1"  (1.549 m)    Wt 64.4 kg    SpO2 96%    BMI 26.83 kg/m   Physical Exam   Constitutional: Appears well-developed and well-nourished.  Psych: Affect appropriate to situation Eyes: No scleral injection HENT: No OP obstruction. Head: Normocephalic.  Cardiovascular: Normal rate and regular rhythm.  Respiratory: Effort normal, symmetric excursions bilaterally, no audible wheezing. GI: Soft.  No distension. There is no tenderness.  Skin: WDI  Neuro: Mental Status: Patient is awake, alert, oriented to person, place, month, year, and situation. Patient is able to give a clear and coherent history. Speech fluent, intact comprehension and repetition. No signs of aphasia  or neglect. Visual Fields are full. Pupils are equal, round, and reactive to light. EOMI without ptosis or diplopia.  Left sided facial numbness. Facial movement is symmetric.  Hearing is intact to voice. Uvula midline and palate elevates symmetrically. Shoulder shrug is symmetric. Tongue is midline without atrophy or fasciculations.  Tone is normal. Bulk is normal. 5/5 RUE. 3/5 LUE. 4/5 RLE. 3/5 LLE. Drift in LUE and LLE.  LUE and LLE numbness compared to RUE and RLE respectively.  Deep Tendon Reflexes: 2+ and symmetric in the biceps and patellae. Toes are downgoing bilaterally. FTN intact. Gait - Deferred  I have reviewed labs in epic and the pertinent results are: CBC Latest Ref Rng & Units 03/25/2021 03/23/2021 03/22/2021  WBC 4.0 - 10.5 K/uL 8.9 10.2 10.6(H)  Hemoglobin 12.0 - 15.0 g/dL 13.1 13.0 14.6  Hematocrit 36.0 - 46.0 % 39.6 37.8 41.0  Platelets 150 - 400 K/uL 262 244 250   CMP Latest Ref Rng & Units 03/25/2021 03/23/2021 03/22/2021  Glucose 70 - 99 mg/dL 101(H) 152(H) 148(H)  BUN 6 - 20 mg/dL 15 15 18   Creatinine 0.44 - 1.00 mg/dL 0.98 0.92 0.83  Sodium 135 - 145 mmol/L 139 137 136  Potassium 3.5 - 5.1 mmol/L 3.9 4.4 3.1(L)  Chloride 98 - 111 mmol/L 103 104 101  CO2 22 - 32 mmol/L 28 24 26   Calcium 8.9 - 10.3 mg/dL 9.2 8.9 9.3  Total Protein 6.5 - 8.1 g/dL 6.6 - -  Total  Bilirubin 0.3 - 1.2 mg/dL 0.5 - -  Alkaline Phos 38 - 126 U/L 95 - -  AST 15 - 41 U/L 14(L) - -  ALT 0 - 44 U/L 10 - -    I have reviewed the images obtained: NCT: Small new foci of decreased density are noted in the right basal ganglia and right internal capsule  Assessment: Hannah Mcdaniel is a 56 y.o. female PMHx of recent stroke s/p TNK (admitted 03/21/21, d/c 03/23/21 on ASA and Plavix), T2DM, HLD, migraine, GERD, smoking, PVD presents to [facility] due to sudden onset left sided numbness and weakness. MRI ordered. Patient will need to be admitted for further stroke workup. Stroke team will follow.  Impression:  DDx recurrent stroke vs psychogenic exacerbation of underlying weakness from prior stroke. MRI pending. Given similarities to recent stroke presentation, highly suspicious for recurrence of stroke. Psychogenic weakness is possible given hx of anxiety but less likely given overall patient symptoms and objective exam findings.  Recommendations: - Recommend inpatient admission for further stroke workup - MRI brain without contrast. - If MRI shows acute infarct, recommend TEE and loop recorder. - Continue ASA+Plavix - Permissive hypertension first 24 h < 220/110.  - Telemetry monitoring for arrhythmia. - Recommend bedside Swallow screen. - Recommend Stroke education. - Recommend PT/OT/SLP consult. - IVF   Electronically signed by:  France Ravens, MD PGY1 Resident 03/25/2021, 4:40 PM  If 7pm- 7am, please page neurology on call as listed in [place].  I have seen and examined the patient. I have discussed the assessment and recommendations with the Neurology resident and made amendations as needed. 56 year old female presenting with worsened left sided weakness symptomatically and verified by exam. Most likely component of the DDx is extension of her recent stroke. Recommendations as above.  Electronically signed: Dr. Kerney Elbe

## 2021-03-25 NOTE — Assessment & Plan Note (Addendum)
Continue rosuvastatin and Zetia. 

## 2021-03-25 NOTE — ED Provider Notes (Signed)
[facility] EMERGENCY DEPARTMENT Provider Note   CSN: [id number] Arrival date & time: 03/25/21  1301     History  Chief Complaint  Patient presents with   Weakness    Hannah Mcdaniel is a 56 y.o. female.  HPI  Patient with medical history including diabetes, hyperlipidemia, GERD, smoker, PAD, bibasilar ganglier stroke presents to the emergency department with complaints of worsening left-sided weakness.  Patient states that she had a stroke on Thursday but she noted that this morning around 6:05 AM she had worsening left leg weakness as well as arm weakness and paresthesias on that side.  She states that after her stroke she was having some weakness in that left leg up and was ambulating with a cane but this morning she states that she was unable to even move her leg, she states that this is slightly improved but she is still some bit weaker than it was before.  She denies any headaches, change in vision, no recent head trauma, not on anticoag, states she has been compliant with her medications.  She has no other complaints at this time.  Reviewed patient's chart patient was admitted on 02/16 for acute stroke involving the left side, she had acute ischemic infarct of on the bilateral basal ganglia limb.  She was started on dual antiplatelet therapy, as well as high intensity antilipid therapy.  Home Medications Prior to Admission medications   Medication Sig Start Date End Date Taking? Authorizing Provider  aspirin EC 81 MG tablet Take 81 mg by mouth daily.   Yes [provider]  clopidogrel (PLAVIX) 75 MG tablet Take 1 tablet (75 mg total) by mouth daily. 06/01/20  Yes Collins, Emma M, PA-C  Evolocumab (REPATHA SURECLICK) 650 MG/ML SOAJ Inject 140 mg into the skin every 14 (fourteen) days. 03/05/21  Yes Hendricks Limes F, FNP  ezetimibe (ZETIA) 10 MG tablet Take 1 tablet (10 mg total) by mouth daily. 11/07/20  Yes Hendricks Limes F, FNP  FLUoxetine (PROZAC) 40 MG  capsule Take 1 capsule (40 mg total) by mouth daily. 11/07/20  Yes Hendricks Limes F, FNP  levocetirizine (XYZAL) 5 MG tablet TAKE 1 TABLET BY MOUTH EVERY DAY IN THE EVENING Patient taking differently: Take 5 mg by mouth every evening. 11/07/20  Yes Hendricks Limes F, FNP  loperamide (IMODIUM A-D) 2 MG tablet Take 1 tablet (2 mg total) by mouth 4 (four) times daily as needed for diarrhea or loose stools. 02/01/21  Yes Ivy Lynn, NP  meclizine (ANTIVERT) 25 MG tablet Take 1 tablet (25 mg total) by mouth 3 (three) times daily as needed for dizziness. 04/10/20  Yes Hendricks Limes F, FNP  metFORMIN (GLUCOPHAGE) 500 MG tablet TAKE 2 TABLETS DAILY WITH BREAKFAST AND 1 TABLET DAILY WITH SUPPER. Patient taking differently: Take 500-1,000 mg by mouth See admin instructions. 1000 mg in the morning 500 mg at dinner 11/07/20  Yes Hendricks Limes F, FNP  montelukast (SINGULAIR) 10 MG tablet Take 1 tablet (10 mg total) by mouth at bedtime. 11/07/20  Yes [name], FNP  ondansetron (ZOFRAN) 4 MG tablet Take 1 tablet (4 mg total) by mouth every 8 (eight) hours as needed for nausea or vomiting. 02/01/21  Yes Ivy Lynn, NP  rosuvastatin (CRESTOR) 40 MG tablet Take 1 tablet (40 mg total) by mouth daily. 11/07/20  Yes Hendricks Limes F, FNP  triamcinolone cream (KENALOG) 0.1 % APPLY TO AFFECTED AREA TWICE A DAY Patient taking differently: Apply 1 application topically  2 (two) times daily as needed (rash). 08/28/20  Yes Hendricks Limes F, FNP  buPROPion (WELLBUTRIN XL) 300 MG 24 hr tablet TAKE 1 TABLET (300 MG TOTAL) BY MOUTH DAILY. TAKE IN ADDITION TO 150 MG FOR A TOTAL OF 450 MG DAILY. Patient not taking: Reported on 03/25/2021 12/29/20   [name], FNP  Continuous Blood Gluc Receiver (FREESTYLE LIBRE 2 READER) DEVI 1 Device by Does not apply route continuous. 11/11/20   [name], FNP  Continuous Blood Gluc Sensor (FREESTYLE LIBRE 2 SENSOR) MISC 1 Device by Does not apply route every 14 (fourteen)  days. 02/14/21   [name], FNP      Allergies    Asa [aspirin], Bempedoic acid, Codeine, and Penicillins    Review of Systems   Review of Systems  Constitutional:  Negative for chills and fever.  Respiratory:  Negative for shortness of breath.   Cardiovascular:  Negative for chest pain.  Gastrointestinal:  Negative for abdominal pain.  Neurological:  Positive for weakness and numbness. Negative for headaches.   Physical Exam Updated Vital Signs BP (!) 175/75    Pulse 71    Temp 98.2 F (36.8 C) (Oral)    Resp (!) 22    Ht 5\' 1"  (1.549 m)    Wt 64.4 kg    SpO2 98%    BMI 26.83 kg/m  Physical Exam Vitals and nursing note reviewed.  Constitutional:      General: She is not in acute distress.    Appearance: She is not ill-appearing.  HENT:     Head: Normocephalic and atraumatic.     Comments: No deformity of the head present, no raccoon eyes or battle sign noted.    Nose: No congestion.  Eyes:     Extraocular Movements: Extraocular movements intact.     Conjunctiva/sclera: Conjunctivae normal.     Pupils: Pupils are equal, round, and reactive to light.  Cardiovascular:     Rate and Rhythm: Normal rate and regular rhythm.     Pulses: Normal pulses.     Heart sounds: No murmur heard.   No friction rub. No gallop.  Pulmonary:     Effort: No respiratory distress.     Breath sounds: No wheezing, rhonchi or rales.  Abdominal:     Palpations: Abdomen is soft.     Tenderness: There is no abdominal tenderness. There is no right CVA tenderness or left CVA tenderness.  Skin:    General: Skin is warm and dry.  Neurological:     Mental Status: She is alert.     GCS: GCS eye subscore is 4. GCS verbal subscore is 5. GCS motor subscore is 6.     Cranial Nerves: Cranial nerves 2-12 are intact.     Sensory: Sensory deficit present.     Motor: Weakness present.     Coordination: Coordination is intact. Finger-Nose-Finger Test and Heel to [place] Test normal.     Comments: Cranial  nerves II through XII grossly intact, no difficult word finding, no slurring of words, able follow two-step commands, she had noted left-sided weakness, she has 5-5 strength in the upper extremities but has 4 out of 5 strength the left lower extremity as well as subjective paresthesias on the left side.  Psychiatric:        Mood and Affect: Mood normal.    ED Results / Procedures / Treatments   Labs (all labs ordered are listed, but only abnormal results are displayed) Labs Reviewed  COMPREHENSIVE METABOLIC PANEL - Abnormal; Notable for the following components:      Result Value   Glucose, Bld 101 (*)    Albumin 3.4 (*)    AST 14 (*)    All other components within normal limits  RESP PANEL BY RT-PCR (FLU A&B, COVID) ARPGX2  CBC WITH DIFFERENTIAL/PLATELET  PROTIME-INR  URINALYSIS, ROUTINE W REFLEX MICROSCOPIC  RAPID URINE DRUG SCREEN, HOSP PERFORMED    EKG EKG Interpretation  Date/Time:  Monday March 25 2021 13:10:45 EST Ventricular Rate:  66 PR Interval:  156 QRS Duration: 98 QT Interval:  386 QTC Calculation: 405 R Axis:   74 Text Interpretation: Sinus rhythm Anterior infarct, old Non specific T wave changes in the inferior leads Confirmed by Regan Lemming ([id number]) on 03/25/2021 2:36:15 PM  Radiology CT Head Wo Contrast  Result Date: 03/25/2021 CLINICAL DATA:  TIA, worsening left-sided weakness EXAM: CT HEAD WITHOUT CONTRAST TECHNIQUE: Contiguous axial images were obtained from the base of the skull through the vertex without intravenous contrast. RADIATION DOSE REDUCTION: This exam was performed according to the departmental dose-optimization program which includes automated exposure control, adjustment of the mA and/or kV according to patient size and/or use of iterative reconstruction technique. COMPARISON:  03/21/2021 FINDINGS: Brain: There are no signs of bleeding within the cranium. There is abnormal prominence of third and both lateral ventricles. Fourth ventricle is not  dilated. Findings may suggest aqueduct stenosis or normal pressure hydrocephalus. Subtle decreased density in right basal ganglia/posterior limb of internal capsule which was not evident in the previous study. There is subtle decreased density in the right frontal cortex with no significant change. Vascular: Unremarkable Skull: Unremarkable. Sinuses/Orbits: Unremarkable. Other: None IMPRESSION: There are no signs of bleeding within the cranium. Small new foci of decreased density are noted in the right basal ganglia and right internal capsule, possibly new areas of ischemia in the right MCA distribution. Follow-up MRI as clinically warranted should be considered. There is dilation of third and both lateral ventricles which may suggest aqueduct stenosis or normal pressure hydrocephalus. Electronically Signed   By: Elmer Picker M.D.   On: 03/25/2021 15:39   MR BRAIN WO CONTRAST  Result Date: 03/25/2021 CLINICAL DATA:  Neuro deficit, acute, stroke suspected. Left-sided body numbness. EXAM: MRI HEAD WITHOUT CONTRAST TECHNIQUE: Multiplanar, multiecho pulse sequences of the brain and surrounding structures were obtained without intravenous contrast. COMPARISON:  Head CT 03/25/2021 and MRI 03/22/2021 FINDINGS: Multiple sequences are mildly to moderately motion degraded. Brain: An acute infarct involving the posterior limb of the right internal capsule and right temporal stem has enlarged from the prior MRI. The small acute infarct in the left lentiform nucleus on the prior MRI is no longer visible on diffusion-weighted imaging. T2 hyperintensities elsewhere in the cerebral white matter bilaterally and in the pons are unchanged and are nonspecific but compatible with mild chronic small vessel ischemic disease. Moderate lateral and third ventriculomegaly is unchanged. The cerebral sulci are normal in size. No intracranial hemorrhage, mass, midline shift, or extra-axial fluid collection is identified. Small chronic  right cerebellar infarcts are again noted. Vascular: Major intracranial vascular flow voids are preserved. Skull and upper cervical spine: Unremarkable bone marrow signal. Sinuses/Orbits: Unremarkable orbits. Clear paranasal sinuses. Trace left mastoid fluid. Other: None. IMPRESSION: 1. Enlargement of an acute infarct involving the posterior limb of the right internal capsule and right temporal stem since the prior MRI. 2. Mild chronic small vessel ischemic disease with small chronic cerebellar infarcts. 3. Unchanged ventriculomegaly which is  nonspecific but could reflect normal pressure hydrocephalus in the appropriate clinical setting. Electronically Signed   By: Logan Bores M.D.   On: 03/25/2021 18:24    Procedures Procedures    Medications Ordered in ED Medications - No data to display  ED Course/ Medical Decision Making/ A&P                           Medical Decision Making Amount and/or Complexity of Data Reviewed Labs: ordered. Radiology: ordered.  Risk Decision regarding hospitalization.   This patient presents to the ED for concern of left-sided weakness, this involves an extensive number of treatment options, and is a complaint that carries with it a high risk of complications and morbidity.  The differential diagnosis includes CVA, intracranial head bleed, metabolic abnormality    Additional history obtained:  Additional history obtained from electronic medical record External records from outside source obtained and reviewed including please see HPI for further detail   Co morbidities that complicate the patient evaluation  Diabetes, hypertension, CVA  Social Determinants of Health:  N/A    Lab Tests:  I Ordered, and personally interpreted labs.  The pertinent results include: CBC unremarkable, CMP shows glucose of 101, albumin 3.4 AST 14 INR unremarkable prothrombin time unremarkable   Imaging Studies ordered:  I ordered imaging studies including CT head,  MRI brain I independently visualized and interpreted imaging which showed CT head showing small new foci of decreased density within the right basal ganglia and right internal capsule likely new area of ischemia, MRI brain shows enlargement of acute infarct involving the right internal capsule right temporal stem. I agree with the radiologist interpretation   Cardiac Monitoring:  The patient was maintained on a cardiac monitor.  I personally viewed and interpreted the cardiac monitored which showed an underlying rhythm of: EKG sinus without signs of ischemia   Medicines ordered and prescription drug management:  I ordered medication including N/A I have reviewed the patients home medicines and have made adjustments as needed patient is currently on dual antiplatelet therapy as well as antilipid therapy we will continue   Reevaluation:  Patient has worsening left lower leg weakness, I am concerned for possible worsening CVA, will obtain CT head for further evaluation.  Imaging consistent with likely new infarct, will consult with neurology for further recommendations.  MRI is consistent with new infarct, neurology has consult please see their consultation note on the patient, recommend hospital admission.  Updated patient on recommendations they agreed in this plan will consult hospitalist team for admission.  Consultations Obtained:  I requested consultation with the neurology Dr. Cheral Marker,  and discussed lab and imaging findings as well as pertinent plan - they recommend: Recommends obtaining MRI of brain, if new infarcts presents recommends hospital admission obtain TEE, loop recorder.   Spoke with Dr. Posey Pronto who will admit the patient.  Rule out Low suspicion for systemic infection patient is nontoxic-appearing, she does not meet sepsis or SIRS criteria.  I have low suspicion for spinal cord abnormality she is no midline tenderness, no traumatic back injury, no urinary incontinency or  retention, presentation atypical of etiology more consistent with likely acute infarct.  Low suspicion for lower limb ischemia no mottling, good pedal pulses bilaterally.   Dispostion and problem list  After consideration of the diagnostic results and the patients response to treatment, I feel that the patent would benefit from admission.   1.  Left-sided weakness-secondary due to  worsening CVA, continue with current treatment, will need TEE and loop recorder and further management.            Final Clinical Impression(s) / ED Diagnoses Final diagnoses:  Cerebrovascular accident (CVA), unspecified mechanism [facility])    Rx / DC Orders ED Discharge Orders     None         Aron Baba 03/25/21 Drema Halon    Regan Lemming, MD 03/25/21 [phone]

## 2021-03-25 NOTE — Telephone Encounter (Signed)
Thayer Headings called in stating patient was recently discharged from the hospital due to a stroke. She states that patient has fallen twice this morning and just is not acting right. They were advised to call 911 and have her transported to ER because she possibly could be having another stroke.

## 2021-03-25 NOTE — Telephone Encounter (Signed)
Appt scheduled/ patient aware.

## 2021-03-25 NOTE — Assessment & Plan Note (Signed)
S/p stenting to right common and external iliac arteries 10/2020.  Continue aspirin, Plavix, and rosuvastatin.

## 2021-03-25 NOTE — Assessment & Plan Note (Signed)
Recent admission for bilateral basal ganglia ischemic stroke s/p TNK 03/21/2021 discharged on DAPT 03/23/2021 presenting with recurrent left-sided numbness and weakness 0600 on 2/20.  MRI brain shows enlargement of acute infarct involving posterior limb of the right internal capsule and right temporal stem. -Neurology following -Continue aspirin 81 mg daily and Plavix 75 mg daily -Continue rosuvastatin 40 mg daily -Allow permissive hypertension for now per neurology -Keep on telemetry, continue neurochecks -Hemoglobin A1c 6.1% on 03/22/2021 -Lipid panel 03/22/2021 showed total cholesterol 261, LDL 162, triglycerides 288, HDL 41 -TTE 03/22/21 showed EF 55%, G1DD

## 2021-03-25 NOTE — Progress Notes (Signed)
Ordered 30 day Event Monitor for further evaluation of stroke at the request of Neurology. Patient has never been seen by our group so we will also arrange a New Patient visit with Dr. Radford Pax (Doctor of the Day).  Darreld Mclean, PA-C 03/25/2021 2:04 PM

## 2021-03-25 NOTE — Assessment & Plan Note (Signed)
Hold metformin, placed on SSI. 

## 2021-03-25 NOTE — Hospital Course (Signed)
Hannah Mcdaniel is a 56 y.o. female with medical history significant for recent bilateral basal ganglia ischemic stroke s/p TNK (03/21/21), T2DM, HLD, PAD (s/p stenting right common and external iliac arteries 10/2020), depression/anxiety, tobacco use who is admitted with recurrent left-sided weakness and numbness found to have enlargement of acute infarct involving posterior limb of right internal capsule and right temporal stem.  Admitted for further work-up per neurology.

## 2021-03-25 NOTE — H&P (Signed)
History and Physical    MODEST DRAEGER MOQ:[id number] DOB: 20-Sep-1965 DOA: 03/25/2021  PCP: [name], FNP  Patient coming from: Home via EMS  I have personally briefly reviewed patient's old medical records in [place]  Chief Complaint: Left-sided weakness  HPI: Hannah Mcdaniel is a 56 y.o. female with medical history significant for recent bilateral basal ganglia ischemic stroke s/p TNK (03/21/21), T2DM, HLD, PAD (s/p stenting right common and external iliac arteries 10/2020), depression/anxiety, tobacco use who presents to the ED for evaluation of recurrent left-sided weakness.  Patient recently admitted 03/21/2021-03/23/2021 for left-sided weakness with decreased sensation.  She was found to have acute ischemic infarcts involving the bilateral basal ganglia, right larger than left.  She was given TNK and admitted under the neurology service.  She was restarted on home DAPT with aspirin and Plavix and discharged to home with plan for 30-day cardiac event monitor.  Discharge exam noted normal strength in all extremities.  Patient states she was feeling well at time of discharge.  She developed left-sided weakness and numbness beginning around 6 AM this morning (2/20) when she was getting out of bed to go to the bathroom.  She had some left facial numbness which has since resolved.  She denied any associated headache, change in vision, nausea, vomiting, chest pain, palpitations, dyspnea.  She says she has been taking her medications as prescribed since discharge.  She states that she has not smoked since she was last admitted.  ED Course   Labs/Imaging on admission: I have personally reviewed following labs and imaging studies.  Initial vitals showed BP 163/76, pulse 66, RR 15, temp 98.2 F, SPO2 98% on room air.  Labs show sodium 139, potassium 3.9, bicarb 28, BUN 15, creatinine 0.98, serum glucose 101, WBC 8.9, hemoglobin 13.1, platelets 262,000.  SARS-CoV-2 and influenza PCR  negative.  CT head without contrast shows small new foci of decreased density in the right basal ganglia and right internal capsule.  No signs of bleeding within the cranium.  MRI brain shows enlargement of an acute infarct involving the posterior limb of the right internal capsule and right temporal stem compared to prior.  Unchanged moderate lateral and third ventriculomegaly noted.  Neurology were consulted and recommended medical admission for further work-up.  The hospitalist service was consulted to admit for further evaluation and management.  Review of Systems: All systems reviewed and are negative except as documented in history of present illness above.   Past Medical History:  Diagnosis Date   Anxiety    Arterial occlusive disease Nov. 2014   Arthritis    Claudication of lower extremity [facility]) Nov. 2014   Right Lower Extremity rest pain   Colon polyps    Depression    Diabetes mellitus without complication ([place])    GERD (gastroesophageal reflux disease)    Hyperlipidemia    Migraines    Vertigo     Past Surgical History:  Procedure Laterality Date   ABDOMINAL AORTAGRAM N/A 01/10/2013   Procedure: ABDOMINAL AORTAGRAM;  Surgeon: Rosetta Posner, MD;  Location: [facility] CATH LAB;  Service: Cardiovascular;  Laterality: N/A;   ABDOMINAL AORTOGRAM W/LOWER EXTREMITY Bilateral 07/16/2018   Procedure: ABDOMINAL AORTOGRAM W/LOWER EXTREMITY;  Surgeon: Angelia Mould, MD;  Location: [place] CV LAB;  Service: Cardiovascular;  Laterality: Bilateral;   ABDOMINAL AORTOGRAM W/LOWER EXTREMITY N/A 10/05/2020   Procedure: ABDOMINAL AORTOGRAM W/LOWER EXTREMITY;  Surgeon: Angelia Mould, MD;  Location: [place] CV LAB;  Service:  Cardiovascular;  Laterality: N/A;   CHOLECYSTECTOMY     Gall Bladder   iliac artery angioplasty and stent placement  01/10/13   LOWER EXTREMITY ANGIOGRAM Bilateral 01/10/2013   Procedure: LOWER EXTREMITY ANGIOGRAM;  Surgeon: Rosetta Posner, MD;  Location: [facility]  CATH LAB;  Service: Cardiovascular;  Laterality: Bilateral;   PERCUTANEOUS STENT INTERVENTION Right 01/10/2013   Procedure: PERCUTANEOUS STENT INTERVENTION;  Surgeon: Rosetta Posner, MD;  Location: [facility] CATH LAB;  Service: Cardiovascular;  Laterality: Right;  rt common iliac stent   PERIPHERAL VASCULAR CATHETERIZATION N/A 11/26/2015   Procedure: Abdominal Aortogram w/Lower Extremity;  Surgeon: Angelia Mould, MD;  Location: [place] CV LAB;  Service: Cardiovascular;  Laterality: N/A;   PERIPHERAL VASCULAR CATHETERIZATION Right 11/26/2015   Procedure: Peripheral Vascular Balloon Angioplasty;  Surgeon: Angelia Mould, MD;  Location: [place] CV LAB;  Service: Cardiovascular;  Laterality: Right;  rt common iliac   PERIPHERAL VASCULAR INTERVENTION  07/16/2018   Procedure: PERIPHERAL VASCULAR INTERVENTION;  Surgeon: Angelia Mould, MD;  Location: [place] CV LAB;  Service: Cardiovascular;;   PERIPHERAL VASCULAR INTERVENTION Right 10/05/2020   Procedure: PERIPHERAL VASCULAR INTERVENTION;  Surgeon: Angelia Mould, MD;  Location: [place] CV LAB;  Service: Cardiovascular;  Laterality: Right;  Common and exteral iliac artery   TOTAL ABDOMINAL HYSTERECTOMY      Social History:  reports that she has been smoking cigarettes. She started smoking about 43 years ago. She has been smoking an average of .25 packs per day. She has never used smokeless tobacco. She reports current alcohol use of about 1.0 standard drink per week. She reports that she does not use drugs.  Allergies  Allergen Reactions   Asa [Aspirin] Rash    Can take low-dose aspirin.   Bempedoic Acid Other (See Comments)    Bilateral shoulder pain.   Codeine Rash   Penicillins Rash    Has patient had a PCN reaction causing immediate rash, facial/tongue/throat swelling, SOB or lightheadedness with hypotension:Yes Has patient had a PCN reaction causing severe rash involving mucus membranes or skin  necrosis:Yes Has patient had a PCN reaction that required hospitalization:No Has patient had a PCN reaction occurring within the last 10 years:No If all of the above answers are "NO", then may proceed with Cephalosporin use.     Family History  Problem Relation Age of Onset   Diabetes Mother    Hyperlipidemia Mother    Hypertension Mother    Varicose Veins Mother    COPD Mother    Stroke Mother    Anxiety disorder Mother    Depression Mother    Ovarian cancer Mother    Acute myelogenous leukemia Father    Diabetes Brother    Hypertension Brother    Hyperlipidemia Brother    Stroke Maternal Grandmother    Anxiety disorder Maternal Grandmother    Depression Maternal Grandmother    Breast cancer Maternal Grandmother    Diabetes Maternal Grandmother    Heart disease Maternal Grandfather    Lung cancer Paternal Grandfather    Other Brother        Blood disease   Anxiety disorder Daughter    Migraines Daughter    Migraines Daughter    Migraines Son    Seizures Son      Prior to Admission medications   Medication Sig Start Date End Date Taking? Authorizing Provider  aspirin EC 81 MG tablet Take 81 mg by mouth daily.   Yes [provider]  clopidogrel (  PLAVIX) 75 MG tablet Take 1 tablet (75 mg total) by mouth daily. 06/01/20  Yes Collins, Emma M, PA-C  Evolocumab (REPATHA SURECLICK) 604 MG/ML SOAJ Inject 140 mg into the skin every 14 (fourteen) days. 03/05/21  Yes Hendricks Limes F, FNP  ezetimibe (ZETIA) 10 MG tablet Take 1 tablet (10 mg total) by mouth daily. 11/07/20  Yes Hendricks Limes F, FNP  FLUoxetine (PROZAC) 40 MG capsule Take 1 capsule (40 mg total) by mouth daily. 11/07/20  Yes Hendricks Limes F, FNP  levocetirizine (XYZAL) 5 MG tablet TAKE 1 TABLET BY MOUTH EVERY DAY IN THE EVENING Patient taking differently: Take 5 mg by mouth every evening. 11/07/20  Yes Hendricks Limes F, FNP  loperamide (IMODIUM A-D) 2 MG tablet Take 1 tablet (2 mg total) by mouth 4 (four)  times daily as needed for diarrhea or loose stools. 02/01/21  Yes Ivy Lynn, NP  meclizine (ANTIVERT) 25 MG tablet Take 1 tablet (25 mg total) by mouth 3 (three) times daily as needed for dizziness. 04/10/20  Yes Hendricks Limes F, FNP  metFORMIN (GLUCOPHAGE) 500 MG tablet TAKE 2 TABLETS DAILY WITH BREAKFAST AND 1 TABLET DAILY WITH SUPPER. Patient taking differently: Take 500-1,000 mg by mouth See admin instructions. 1000 mg in the morning 500 mg at dinner 11/07/20  Yes Hendricks Limes F, FNP  montelukast (SINGULAIR) 10 MG tablet Take 1 tablet (10 mg total) by mouth at bedtime. 11/07/20  Yes [name], FNP  ondansetron (ZOFRAN) 4 MG tablet Take 1 tablet (4 mg total) by mouth every 8 (eight) hours as needed for nausea or vomiting. 02/01/21  Yes Ivy Lynn, NP  rosuvastatin (CRESTOR) 40 MG tablet Take 1 tablet (40 mg total) by mouth daily. 11/07/20  Yes Hendricks Limes F, FNP  triamcinolone cream (KENALOG) 0.1 % APPLY TO AFFECTED AREA TWICE A DAY Patient taking differently: Apply 1 application topically 2 (two) times daily as needed (rash). 08/28/20  Yes Hendricks Limes F, FNP  buPROPion (WELLBUTRIN XL) 300 MG 24 hr tablet TAKE 1 TABLET (300 MG TOTAL) BY MOUTH DAILY. TAKE IN ADDITION TO 150 MG FOR A TOTAL OF 450 MG DAILY. Patient not taking: Reported on 03/25/2021 12/29/20   [name], FNP  Continuous Blood Gluc Receiver (FREESTYLE LIBRE 2 READER) DEVI 1 Device by Does not apply route continuous. 11/11/20   [name], FNP  Continuous Blood Gluc Sensor (FREESTYLE LIBRE 2 SENSOR) MISC 1 Device by Does not apply route every 14 (fourteen) days. 02/14/21   [name], FNP    Physical Exam: Vitals:   03/25/21 1630 03/25/21 1700 03/25/21 1800 03/25/21 1845  BP: (!) 149/127 (!) 182/73 (!) 168/85 (!) 175/75  Pulse: (!) 53 64 64 71  Resp: 12 (!) 22 15 (!) 22  Temp:      TempSrc:      SpO2: 96% 96% 98% 98%  Weight:      Height:       Constitutional: Resting supine in bed,  NAD, calm, comfortable Eyes: PERRL, lids and conjunctivae normal ENMT: Mucous membranes are moist. Posterior pharynx clear of any exudate or lesions.poor dentition.  Neck: normal, supple, no masses. Respiratory: clear to auscultation bilaterally, no wheezing, no crackles. Normal respiratory effort. No accessory muscle use.  Cardiovascular: Regular rate and rhythm, no murmurs / rubs / gallops. No extremity edema. 2+ pedal pulses. Abdomen: no tenderness, no masses palpated. No hepatosplenomegaly. Bowel sounds positive.  Musculoskeletal: no clubbing / cyanosis. No joint deformity upper and  lower extremities. Good ROM, no contractures. Normal muscle tone.  Skin: no rashes, lesions, ulcers. No induration Neurologic: CN 2-12 grossly intact, speech fluent. Sensation intact. Strength 5/5 right upper and lower extremity, 4/5 LUE, 3/5 LLE. Psychiatric: Normal judgment and insight. Alert and oriented x 3. Normal mood.   EKG: Personally reviewed. Normal sinus rhythm, nonspecific T wave changes inferior leads.  Similar to prior.  Assessment/Plan Principal Problem:   Acute CVA (cerebrovascular accident) ([place]) Active Problems:   DM type 2 with diabetic dyslipidemia ([place])   PAD (peripheral artery disease) ([place])   Hyperlipidemia   Depression with anxiety   ANABIA WEATHERWAX is a 56 y.o. female with medical history significant for recent bilateral basal ganglia ischemic stroke s/p TNK (03/21/21), T2DM, HLD, PAD (s/p stenting right common and external iliac arteries 10/2020), depression/anxiety, tobacco use who is admitted with recurrent left-sided weakness and numbness found to have enlargement of acute infarct involving posterior limb of right internal capsule and right temporal stem.  Admitted for further work-up per neurology.  Assessment and Plan: * Acute CVA (cerebrovascular accident) ([place])- (present on admission) Recent admission for bilateral basal ganglia ischemic stroke s/p TNK 03/21/2021 discharged on  DAPT 03/23/2021 presenting with recurrent left-sided numbness and weakness 0600 on 2/20.  MRI brain shows enlargement of acute infarct involving posterior limb of the right internal capsule and right temporal stem. -Neurology following -Continue aspirin 81 mg daily and Plavix 75 mg daily -Continue rosuvastatin 40 mg daily -Allow permissive hypertension for now per neurology -Keep on telemetry, continue neurochecks -Hemoglobin A1c 6.1% on 03/22/2021 -Lipid panel 03/22/2021 showed total cholesterol 261, LDL 162, triglycerides 288, HDL 41 -TTE 03/22/21 showed EF 55%, G1DD  DM type 2 with diabetic dyslipidemia ([place])- (present on admission) Hold metformin, placed on SSI.  PAD (peripheral artery disease) ([place])- (present on admission) S/p stenting to right common and external iliac arteries 10/2020.  Continue aspirin, Plavix, and rosuvastatin.  Hyperlipidemia- (present on admission) Continue rosuvastatin and Zetia.  Depression with anxiety- (present on admission) Continue Prozac.  DVT prophylaxis: SCD's Start: 03/25/21 2000 Code Status: Full code, confirmed with patient on admission Family Communication: Discussed with patient's husband at bedside Disposition Plan: From home and likely discharge to home pending clinical progress Consults called: Neurology Severity of Illness: The appropriate patient status for this patient is OBSERVATION. Observation status is judged to be reasonable and necessary in order to provide the required intensity of service to ensure the patient's safety. The patient's presenting symptoms, physical exam findings, and initial radiographic and laboratory data in the context of their medical condition is felt to place them at decreased risk for further clinical deterioration. Furthermore, it is anticipated that the patient will be medically stable for discharge from the hospital within 2 midnights of admission.   Zada Finders MD [facility]  If 7PM-7AM, please  contact night-coverage [email or web address]  03/25/2021, 8:10 PM

## 2021-03-25 NOTE — Telephone Encounter (Signed)
Transition Care Management Unsuccessful Follow-up Telephone Call  Date of discharge and from where:  Hannah Mcdaniel 03/23/21 - stroke  Attempts:  1st Attempt  Reason for unsuccessful TCM follow-up call:  Left voice message    Transition Care Management Unsuccessful Follow-up Telephone Call  Date of discharge and from where:  03/23/21 - Hannah Mcdaniel - stroke  Attempts:  2nd Attempt  Reason for unsuccessful TCM follow-up call:  No answer/busy - no voice mail picked up

## 2021-03-25 NOTE — ED Notes (Signed)
Patient transported to MRI 

## 2021-03-25 NOTE — ED Triage Notes (Signed)
Per [facility] EMS pt reports worsening left sided weakness since 6am today. Patient was just in hospital this past week for stroke and given TNK.

## 2021-03-26 ENCOUNTER — Encounter (HOSPITAL_COMMUNITY): Payer: Self-pay | Admitting: Internal Medicine

## 2021-03-26 ENCOUNTER — Encounter (HOSPITAL_COMMUNITY)
Admission: EM | Disposition: A | Discharge status: Home / Self Care | Payer: Self-pay | Source: Home / Self Care | Attending: Internal Medicine

## 2021-03-26 ENCOUNTER — Observation Stay (HOSPITAL_BASED_OUTPATIENT_CLINIC_OR_DEPARTMENT_OTHER): Payer: 59 | Admitting: Certified Registered Nurse Anesthetist

## 2021-03-26 ENCOUNTER — Observation Stay (HOSPITAL_COMMUNITY): Payer: 59 | Admitting: Certified Registered Nurse Anesthetist

## 2021-03-26 ENCOUNTER — Observation Stay (HOSPITAL_COMMUNITY): Payer: 59

## 2021-03-26 DIAGNOSIS — I70221 Atherosclerosis of native arteries of extremities with rest pain, right leg: Secondary | ICD-10-CM | POA: Diagnosis not present

## 2021-03-26 DIAGNOSIS — E1169 Type 2 diabetes mellitus with other specified complication: Secondary | ICD-10-CM | POA: Diagnosis not present

## 2021-03-26 DIAGNOSIS — F17200 Nicotine dependence, unspecified, uncomplicated: Secondary | ICD-10-CM

## 2021-03-26 DIAGNOSIS — I340 Nonrheumatic mitral (valve) insufficiency: Secondary | ICD-10-CM | POA: Diagnosis not present

## 2021-03-26 DIAGNOSIS — E1151 Type 2 diabetes mellitus with diabetic peripheral angiopathy without gangrene: Secondary | ICD-10-CM

## 2021-03-26 DIAGNOSIS — I6990 Unspecified sequelae of unspecified cerebrovascular disease: Secondary | ICD-10-CM

## 2021-03-26 DIAGNOSIS — K219 Gastro-esophageal reflux disease without esophagitis: Secondary | ICD-10-CM

## 2021-03-26 DIAGNOSIS — I6350 Cerebral infarction due to unspecified occlusion or stenosis of unspecified cerebral artery: Secondary | ICD-10-CM | POA: Diagnosis not present

## 2021-03-26 DIAGNOSIS — Z886 Allergy status to analgesic agent status: Secondary | ICD-10-CM | POA: Diagnosis not present

## 2021-03-26 DIAGNOSIS — F419 Anxiety disorder, unspecified: Secondary | ICD-10-CM | POA: Diagnosis present

## 2021-03-26 DIAGNOSIS — E785 Hyperlipidemia, unspecified: Secondary | ICD-10-CM

## 2021-03-26 DIAGNOSIS — Z8249 Family history of ischemic heart disease and other diseases of the circulatory system: Secondary | ICD-10-CM | POA: Diagnosis not present

## 2021-03-26 DIAGNOSIS — I10 Essential (primary) hypertension: Secondary | ICD-10-CM | POA: Diagnosis not present

## 2021-03-26 DIAGNOSIS — Z8349 Family history of other endocrine, nutritional and metabolic diseases: Secondary | ICD-10-CM | POA: Diagnosis not present

## 2021-03-26 DIAGNOSIS — I739 Peripheral vascular disease, unspecified: Secondary | ICD-10-CM

## 2021-03-26 DIAGNOSIS — E782 Mixed hyperlipidemia: Secondary | ICD-10-CM

## 2021-03-26 DIAGNOSIS — Z833 Family history of diabetes mellitus: Secondary | ICD-10-CM | POA: Diagnosis not present

## 2021-03-26 DIAGNOSIS — Z20822 Contact with and (suspected) exposure to covid-19: Secondary | ICD-10-CM | POA: Diagnosis not present

## 2021-03-26 DIAGNOSIS — I081 Rheumatic disorders of both mitral and tricuspid valves: Secondary | ICD-10-CM | POA: Diagnosis not present

## 2021-03-26 DIAGNOSIS — M1990 Unspecified osteoarthritis, unspecified site: Secondary | ICD-10-CM | POA: Diagnosis present

## 2021-03-26 DIAGNOSIS — I639 Cerebral infarction, unspecified: Secondary | ICD-10-CM | POA: Diagnosis not present

## 2021-03-26 DIAGNOSIS — G8194 Hemiplegia, unspecified affecting left nondominant side: Secondary | ICD-10-CM | POA: Diagnosis not present

## 2021-03-26 DIAGNOSIS — F418 Other specified anxiety disorders: Secondary | ICD-10-CM

## 2021-03-26 DIAGNOSIS — Z880 Allergy status to penicillin: Secondary | ICD-10-CM | POA: Diagnosis not present

## 2021-03-26 DIAGNOSIS — F17210 Nicotine dependence, cigarettes, uncomplicated: Secondary | ICD-10-CM | POA: Diagnosis present

## 2021-03-26 DIAGNOSIS — G43909 Migraine, unspecified, not intractable, without status migrainosus: Secondary | ICD-10-CM | POA: Diagnosis present

## 2021-03-26 DIAGNOSIS — Z86010 Personal history of colonic polyps: Secondary | ICD-10-CM | POA: Diagnosis not present

## 2021-03-26 DIAGNOSIS — Z888 Allergy status to other drugs, medicaments and biological substances status: Secondary | ICD-10-CM | POA: Diagnosis not present

## 2021-03-26 DIAGNOSIS — Z885 Allergy status to narcotic agent status: Secondary | ICD-10-CM | POA: Diagnosis not present

## 2021-03-26 DIAGNOSIS — Z825 Family history of asthma and other chronic lower respiratory diseases: Secondary | ICD-10-CM | POA: Diagnosis not present

## 2021-03-26 DIAGNOSIS — Z823 Family history of stroke: Secondary | ICD-10-CM | POA: Diagnosis not present

## 2021-03-26 DIAGNOSIS — R69 Illness, unspecified: Secondary | ICD-10-CM | POA: Diagnosis not present

## 2021-03-26 DIAGNOSIS — F32A Depression, unspecified: Secondary | ICD-10-CM | POA: Diagnosis present

## 2021-03-26 HISTORY — PX: TEE WITHOUT CARDIOVERSION: SHX5443

## 2021-03-26 HISTORY — PX: BUBBLE STUDY: SHX6837

## 2021-03-26 LAB — CBC
HCT: 39.1 % (ref 36.0–46.0)
Hemoglobin: 13.6 g/dL (ref 12.0–15.0)
MCH: 31.1 pg (ref 26.0–34.0)
MCHC: 34.8 g/dL (ref 30.0–36.0)
MCV: 89.5 fL (ref 80.0–100.0)
Platelets: 251 10*3/uL (ref 150–400)
RBC: 4.37 MIL/uL (ref 3.87–5.11)
RDW: 11.8 % (ref 11.5–15.5)
WBC: 9.8 10*3/uL (ref 4.0–10.5)
nRBC: 0 % (ref 0.0–0.2)

## 2021-03-26 LAB — BASIC METABOLIC PANEL
Anion gap: 8 (ref 5–15)
BUN: 17 mg/dL (ref 6–20)
CO2: 27 mmol/L (ref 22–32)
Calcium: 9.1 mg/dL (ref 8.9–10.3)
Chloride: 102 mmol/L (ref 98–111)
Creatinine, Ser: 0.82 mg/dL (ref 0.44–1.00)
GFR, Estimated: >60 mL/min (ref >60)
Glucose, Bld: 139 mg/dL — ABNORMAL HIGH (ref 70–99)
Potassium: 3.7 mmol/L (ref 3.5–5.1)
Sodium: 137 mmol/L (ref 135–145)

## 2021-03-26 LAB — GLUCOSE, CAPILLARY
Glucose-Capillary: 128 mg/dL — ABNORMAL HIGH (ref 70–99)
Glucose-Capillary: 265 mg/dL — ABNORMAL HIGH (ref 70–99)
Glucose-Capillary: 151 mg/dL — ABNORMAL HIGH (ref 70–99)

## 2021-03-26 SURGERY — ECHOCARDIOGRAM, TRANSESOPHAGEAL
Anesthesia: Monitor Anesthesia Care

## 2021-03-26 MED ORDER — PROPOFOL 10 MG/ML IV BOLUS
INTRAVENOUS | Status: DC | PRN
  Administered 2021-03-26: 20 mg via INTRAVENOUS
  Administered 2021-03-26 (×3): 10 mg via INTRAVENOUS

## 2021-03-26 MED ORDER — SODIUM CHLORIDE 0.9 % IV SOLN: INTRAVENOUS | Status: DC

## 2021-03-26 MED ORDER — PROPOFOL 500 MG/50ML IV EMUL
INTRAVENOUS | Status: DC | PRN
Start: 2021-03-26 — End: 2021-03-26
  Administered 2021-03-26: 100 ug/kg/min via INTRAVENOUS

## 2021-03-26 MED ORDER — TICAGRELOR 90 MG PO TABS
90.0000 mg | ORAL_TABLET | Freq: Two times a day (BID) | ORAL | Status: DC
  Administered 2021-03-27: 90 mg via ORAL
  Filled 2021-03-26: qty 1

## 2021-03-26 MED ORDER — PANTOPRAZOLE SODIUM 40 MG PO TBEC
40.0000 mg | DELAYED_RELEASE_TABLET | Freq: Every day | ORAL | Status: DC
  Administered 2021-03-26: 40 mg via ORAL
  Filled 2021-03-26: qty 1

## 2021-03-26 MED ORDER — SODIUM CHLORIDE 0.9 % IV SOLN: INTRAVENOUS | Status: DC | PRN

## 2021-03-26 NOTE — Evaluation (Signed)
Occupational Therapy Evaluation Patient Details Name: Hannah Mcdaniel MRN: [id number] DOB: 16-Oct-1965 Today's Date: 03/26/2021   History of Present Illness Hannah Mcdaniel is a 56 y.o. female with medical history significant for recent bilateral basal ganglia ischemic stroke s/p TNK (03/21/21), T2DM, HLD, PAD (s/p stenting right common and external iliac arteries 10/2020), depression/anxiety, tobacco use who presents to the ED for evaluation of recurrent left-sided weakness. Imaging revealed new foci of decresaed density in R basal ganglia and internal capsule. MRI revealed enlargement of acute infarct involving the posteior limb of R internal capsule and temporal stem   Clinical Impression   Hannah was evaluated s/p the above CVA, she was recently discharged home and reports she was generally mod I for mobility and ADLs with use of RW, but also reports two falls. She lives at home with her husband and daughter who provide 24/7 assist. Upon evaluation Hannah Mcdaniel demonstrated set up/supervision UB ADLs while sitting and standing at the sink, and close min guard for LB tasks and functional mobility with RW. She is limited by LLE weakness, LUE mild weakness, impaired coordination and paresthesia. Hannah Mcdaniel will benefit from OT to address the limitations listed below. Anticipate Hannah Mcdaniel d/c home without need for follow up OT.      Recommendations for follow up therapy are one component of a multi-disciplinary discharge planning process, led by the attending physician.  Recommendations may be updated based on patient status, additional functional criteria and insurance authorization.   Follow Up Recommendations  No OT follow up    Assistance Recommended at Discharge Set up Supervision/Assistance  Patient can return home with the following Assistance with cooking/housework    Functional Status Assessment  Patient has had a recent decline in their functional status and demonstrates the ability to make significant  improvements in function in a reasonable and predictable amount of time.  Equipment Recommendations  None recommended by OT       Precautions / Restrictions Precautions Precautions: Fall Restrictions Weight Bearing Restrictions: No      Mobility Bed Mobility Overal bed mobility: Needs Assistance Bed Mobility: Sit to Supine     Supine to sit: Supervision     General bed mobility comments: incr time and use of bed rails    Transfers Overall transfer level: Needs assistance Equipment used: Rolling walker (2 wheels) Transfers: Sit to/from Stand Sit to Stand: Min guard           General transfer comment: verbal cues for safe positioning. Hannah Mcdaniel seemingly ignoring cues      Balance Overall balance assessment: Needs assistance Sitting-balance support: No upper extremity supported Sitting balance-Leahy Scale: Good Sitting balance - Comments: can move dynamically without LOB   Standing balance support: Single extremity supported, During functional activity Standing balance-Leahy Scale: Fair           ADL either performed or assessed with clinical judgement   ADL Overall ADL's : Needs assistance/impaired Eating/Feeding: Independent;Sitting   Grooming: Supervision/safety;Standing Grooming Details (indicate cue type and reason): mildly incr time for bimanual tasks Upper Body Bathing: Set up;Sitting   Lower Body Bathing: Min guard;Sit to/from stand   Upper Body Dressing : Set up;Sitting   Lower Body Dressing: Min guard;Sit to/from stand Lower Body Dressing Details (indicate cue type and reason): incr time Toilet Transfer: Min guard;Rolling walker (2 wheels);Ambulation   Toileting- Clothing Manipulation and Hygiene: Supervision/safety;Sitting/lateral lean       Functional mobility during ADLs: Min guard;Rolling walker (2 wheels) General ADL Comments: min guard for  safety 2/2 Hannah Mcdaniel LLE impairments and increased fall risk     Vision Baseline Vision/History: 1 Wears  glasses Ability to See in Adequate Light: 0 Adequate Patient Visual Report: No change from baseline Vision Assessment?: No apparent visual deficits            Pertinent Vitals/Pain Pain Assessment Pain Assessment: No/denies pain     Hand Dominance Right   Extremity/Trunk Assessment Upper Extremity Assessment Upper Extremity Assessment: LUE deficits/detail LUE Deficits / Details: globally 4+/5 MMT, full ROM, reports tingling "pins & needles" paresthesia. Mildly slow/deliberate coordination, more imapire with eyes closed coordination testing. LUE Coordination: decreased gross motor   Lower Extremity Assessment Lower Extremity Assessment: Defer to Hannah Mcdaniel evaluation LLE Deficits / Details: grossly 4+/5 LLE Sensation: decreased light touch (reports 60% deficit) LLE Coordination: decreased gross motor (difficulty bringing L foot up to don sock)   Cervical / Trunk Assessment Cervical / Trunk Assessment: Normal   Communication Communication Communication: No difficulties   Cognition Arousal/Alertness: Awake/alert Behavior During Therapy: WFL for tasks assessed/performed, Flat affect Overall Cognitive Status: Within Functional Limits for tasks assessed           General Comments  VSS on RA            Home Living Family/patient expects to be discharged to:: Private residence Living Arrangements: Spouse/significant other Available Help at Discharge: Family;Available 24 hours/day Type of Home: Other(Comment) Home Access: Ramped entrance     Home Layout: One level     Bathroom Shower/Tub: Occupational psychologist: Standard Bathroom Accessibility: Yes   Home Equipment: Cane - single point;Shower seat;BSC/3in1;Rolling Environmental consultant (2 wheels)   Additional Comments: daughter and spouse switching on/off of work      Prior Functioning/Environment Prior Level of Function : Needs assist             Mobility Comments: was using RW ADLs Comments: supervision, set up  for ADLs        OT Problem List: Decreased safety awareness;Impaired UE functional use      OT Treatment/Interventions: Self-care/ADL training;Patient/family education;Therapeutic activities    OT Goals(Current goals can be found in the care plan section) Acute Rehab OT Goals Patient Stated Goal: back home OT Goal Formulation: With patient Time For Goal Achievement: 04/09/21 Potential to Achieve Goals: Good ADL Goals Hannah Mcdaniel Will Perform Lower Body Dressing: Independently;sit to/from stand Hannah Mcdaniel Will Transfer to Toilet: Independently;ambulating Hannah Mcdaniel Will Perform Tub/Shower Transfer: Shower transfer;Independently Additional ADL Goal #1: Hannah Mcdaniel will indep recall BE FAST stroke education for safety at d/c Additional ADL Goal #2: Hannah Mcdaniel will indep complete LUE exercises with HEP to increase strength and coordination  OT Frequency: Min 2X/week       AM-PAC OT "6 Clicks" Daily Activity     Outcome Measure Help from another person eating meals?: None Help from another person taking care of personal grooming?: A Little Help from another person toileting, which includes using toliet, bedpan, or urinal?: A Little Help from another person bathing (including washing, rinsing, drying)?: A Little Help from another person to put on and taking off regular upper body clothing?: None Help from another person to put on and taking off regular lower body clothing?: A Little 6 Click Score: 20   End of Session Equipment Utilized During Treatment: Gait belt;Rolling walker (2 wheels) Nurse Communication: Mobility status  Activity Tolerance: Patient tolerated treatment well Patient left: in bed;with call bell/phone within reach;with bed alarm set;with family/visitor present  OT Visit Diagnosis: Muscle weakness (generalized) (M62.81);Hemiplegia  and hemiparesis Hemiplegia - Right/Left: Left Hemiplegia - dominant/non-dominant: Non-Dominant Hemiplegia - caused by: Other cerebrovascular disease                Time:  1015-1028 OT Time Calculation (min): 13 min Charges:  OT General Charges $OT Visit: 1 Visit OT Evaluation $OT Eval Moderate Complexity: 1 Mod   Memory Heinrichs A Tyrene Nader 03/26/2021, 11:35 AM

## 2021-03-26 NOTE — Anesthesia Procedure Notes (Signed)
Procedure Name: MAC Date/Time: 03/26/2021 12:45 PM Performed by: Harden Mo, CRNA Pre-anesthesia Checklist: Patient identified, Emergency Drugs available, Suction available and Patient being monitored Patient Re-evaluated:Patient Re-evaluated prior to induction Oxygen Delivery Method: Nasal cannula Preoxygenation: Pre-oxygenation with 100% oxygen Induction Type: IV induction Placement Confirmation: positive ETCO2 and breath sounds checked- equal and bilateral Dental Injury: Teeth and Oropharynx as per pre-operative assessment

## 2021-03-26 NOTE — Progress Notes (Signed)
° ° °  [facility] has been requested to perform a transesophageal echocardiogram on Elizah T Strength for CVA. Second CVA this month.  After careful review of history and examination, the risks and benefits of transesophageal echocardiogram have been explained including risks of esophageal damage, perforation (1:10,000 risk), bleeding, pharyngeal hematoma as well as other potential complications associated with conscious sedation including aspiration, arrhythmia, respiratory failure and death. Alternatives to treatment were discussed, questions were answered. Patient is willing to proceed.   Cecilie Kicks, NP  03/26/2021 9:04 AM

## 2021-03-26 NOTE — Transfer of Care (Signed)
Immediate Anesthesia Transfer of Care Note  Patient: Hannah Mcdaniel  Procedure(s) Performed: TRANSESOPHAGEAL ECHOCARDIOGRAM (TEE) BUBBLE STUDY  Patient Location: Endoscopy Unit  Anesthesia Type:MAC  Level of Consciousness: awake and drowsy  Airway & Oxygen Therapy: Patient Spontanous Breathing  Post-op Assessment: Report given to RN and Post -op Vital signs reviewed and stable  Post vital signs: Reviewed and stable  Last Vitals:  Vitals Value Taken Time  BP 186/78   Temp    Pulse 78   Resp 12   SpO2 94     Last Pain:  Vitals:   03/26/21 1215  TempSrc: Temporal  PainSc: 0-No pain         Complications: No notable events documented.

## 2021-03-26 NOTE — Progress Notes (Signed)
Hannah Mcdaniel  BOF:[id number] DOB: 06-22-1965 DOA: 03/25/2021 PCP: [name], FNP    Brief Narrative:  56 year old with a history of bilateral basal ganglia ischemic stroke status post TNK 03/21/2021, DM2, HLD, PAD status post stenting to the right common and external iliac arteries 2022, depression/anxiety, and tobacco abuse who returned to the ED with recurrent left-sided weakness.  At time of recent discharge 03/23/2021 patient had regained normal strength in all of her extremities.  The return of her left-sided weakness and numbness occurred at 6 AM 03/25/2021 and was first appreciated when she woke up from the bed to go to the bathroom.  She attests to compliance with aspirin and Plavix as per instructions at time of her recent discharge.  Consultants:  Neurology  Code Status: FULL CODE  Antimicrobials:  None  DVT prophylaxis: SCDs  Interim Hx: Afebrile.  Vital signs stable.  Resting comfortably in bed at time of visit.  Denies chest pain shortness of breath fevers or chills.  Tells me she feels very weak in general.  Assessment & Plan:  Acute recurrent CVA MRI brain this admission noted enlargement of infarct involving posterior limb of the right internal capsule and right temporal stem -evaluation per neurology -continue aspirin and Plavix -permissive hypertension -TEE to be accomplished today -to consider loop recorder during this admission versus 30-day event monitor as was previously planned  DM2 CBG reasonably controlled -follow trend  PAD Asymptomatic presently  HLD Continue rosuvastatin and Zetia  Depression with anxiety Continue Prozac  Family Communication: Spoke with patient and husband at bedside Disposition: From home -PT/OT to reevaluate to assist in planning disposition -TEE pending  Objective: Blood pressure (!) 179/78, pulse 61, temperature 97.6 F (36.4 C), temperature source Axillary, resp. rate 16, height 5\' 1"  (1.549 m), weight 66 kg, SpO2  96 %.  Intake/Output Summary (Last 24 hours) at 03/26/2021 0921 Last data filed at 03/26/2021 0600 Gross per 24 hour  Intake 0 ml  Output --  Net 0 ml   Filed Weights   03/25/21 1312 03/25/21 2130  Weight: 64.4 kg 66 kg    Examination: General: No acute respiratory distress Lungs: Clear to auscultation bilaterally without wheezes or crackles Cardiovascular: Regular rate and rhythm without murmur gallop or rub normal S1 and S2 Abdomen: Nontender, nondistended, soft, bowel sounds positive, no rebound, no ascites, no appreciable mass Extremities: No significant cyanosis, clubbing, or edema bilateral lower extremities  CBC: Recent Labs  Lab 03/21/21 2132 03/21/21 2148 03/23/21 0343 03/25/21 1347 03/26/21 0154  WBC 10.4   < > 10.2 8.9 9.8  NEUTROABS 5.3  --   --  5.0  --   HGB 14.6   < > 13.0 13.1 13.6  HCT 41.9   < > 37.8 39.6 39.1  MCV 89.3   < > 89.6 91.0 89.5  PLT 258   < > 244 262 251   < > = values in this interval not displayed.   Basic Metabolic Panel: Recent Labs  Lab 03/23/21 0343 03/25/21 1347 03/26/21 0154  NA 137 139 137  K 4.4 3.9 3.7  CL 104 103 102  CO2 24 28 27   GLUCOSE 152* 101* 139*  BUN 15 15 17   CREATININE 0.92 0.98 0.82  CALCIUM 8.9 9.2 9.1   GFR: Estimated Creatinine Clearance: 66.6 mL/min (by C-G formula based on SCr of 0.82 mg/dL).  Liver Function Tests: Recent Labs  Lab 03/21/21 2132 03/25/21 1347  AST 16 14*  ALT 10 10  ALKPHOS 96 95  BILITOT 0.4 0.5  PROT 6.6 6.6  ALBUMIN 3.7 3.4*    Coagulation Profile: Recent Labs  Lab 03/21/21 2132 03/25/21 1347  INR 1.0 1.0    HbA1C: HB A1C (BAYER DCA - WAIVED)  Date/Time Value Ref Range Status  11/07/2020 03:05 PM 6.3 (H) 4.8 - 5.6 % Final    Comment:             Prediabetes: 5.7 - 6.4          Diabetes: >6.4          Glycemic control for adults with diabetes: <7.0               **Please note reference interval change**   08/09/2020 12:16 PM 6.9 <7.0 % Final    Comment:                                           Diabetic Adult            <7.0                                       Healthy Adult        4.3 - 5.7                                                           (DCCT/NGSP) American Diabetes Association's Summary of Glycemic Recommendations for Adults with Diabetes: Hemoglobin A1c <7.0%. More stringent glycemic goals (A1c <6.0%) may further reduce complications at the cost of increased risk of hypoglycemia.    Hgb A1c MFr Bld  Date/Time Value Ref Range Status  03/22/2021 01:07 AM 6.1 (H) 4.8 - 5.6 % Final    Comment:    (NOTE) Pre diabetes:          5.7%-6.4%  Diabetes:              >6.4%  Glycemic control for   <7.0% adults with diabetes     CBG: Recent Labs  Lab 03/22/21 2209 03/23/21 0834 03/23/21 1113 03/25/21 2201 03/26/21 0614  GLUCAP 266* 177* 178* 172* 128*     Scheduled Meds:  aspirin EC  81 mg Oral Daily   clopidogrel  75 mg Oral Daily   ezetimibe  10 mg Oral Daily   FLUoxetine  40 mg Oral Daily   insulin aspart  0-9 Units Subcutaneous TID WC   montelukast  10 mg Oral QHS   rosuvastatin  40 mg Oral Daily      LOS: 0 days   Cherene Altes, MD [facility] Office  [phone] Pager - Text Page per Shea Evans  If 7PM-7AM, please contact night-coverage per Amion 03/26/2021, 9:21 AM

## 2021-03-26 NOTE — Anesthesia Postprocedure Evaluation (Signed)
Anesthesia Post Note  Patient: Hannah Mcdaniel  Procedure(s) Performed: TRANSESOPHAGEAL ECHOCARDIOGRAM (TEE) BUBBLE STUDY     Patient location during evaluation: Endoscopy Anesthesia Type: MAC Level of consciousness: awake and alert Pain management: pain level controlled Vital Signs Assessment: post-procedure vital signs reviewed and stable Respiratory status: spontaneous breathing, nonlabored ventilation and respiratory function stable Cardiovascular status: blood pressure returned to baseline and stable Postop Assessment: no apparent nausea or vomiting Anesthetic complications: no   No notable events documented.  Last Vitals:  Vitals:   03/26/21 1325 03/26/21 1335  BP: (!) 182/86 (!) 211/71  Pulse: 69 68  Resp: (!) 21 (!) 21  Temp:    SpO2: 95% 97%    Last Pain:  Vitals:   03/26/21 1335  TempSrc:   PainSc: 0-No pain                 Lidia Collum

## 2021-03-26 NOTE — Interval H&P Note (Signed)
History and Physical Interval Note:  03/26/2021 12:35 PM  Hannah Mcdaniel  has presented today for surgery, with the diagnosis of stroke.  The various methods of treatment have been discussed with the patient and family. After consideration of risks, benefits and other options for treatment, the patient has consented to  Procedure(s): TRANSESOPHAGEAL ECHOCARDIOGRAM (TEE) (N/A) as a surgical intervention.  The patient's history has been reviewed, patient examined, no change in status, stable for surgery.  I have reviewed the patient's chart and labs.  Questions were answered to the patient's satisfaction.     Donato Heinz

## 2021-03-26 NOTE — Progress Notes (Signed)
Patient states that she does not feel the need to urinate, husband confirms that the last urine was in the ED around 2000. No documentation noted, bladder scan for 167cc, will continue to monitor.

## 2021-03-26 NOTE — Anesthesia Preprocedure Evaluation (Signed)
Anesthesia Evaluation  Patient identified by MRN, date of birth, ID band Patient awake    Reviewed: Allergy & Precautions, NPO status , Patient's Chart, lab work & pertinent test results  History of Anesthesia Complications Negative for: history of anesthetic complications  Airway Mallampati: II  TM Distance: >3 FB Neck ROM: Full    Dental  (+) Poor Dentition, Missing, Chipped   Pulmonary Current Smoker and Patient abstained from smoking.,    Pulmonary exam normal        Cardiovascular + Peripheral Vascular Disease  Normal cardiovascular exam     Neuro/Psych  Headaches, Anxiety Depression CVA, Residual Symptoms    GI/Hepatic Neg liver ROS, GERD  ,  Endo/Other  diabetes  Renal/GU negative Renal ROS  negative genitourinary   Musculoskeletal  (+) Arthritis ,   Abdominal   Peds  Hematology negative hematology ROS (+)   Anesthesia Other Findings   Reproductive/Obstetrics                            Anesthesia Physical Anesthesia Plan  ASA: 4  Anesthesia Plan: MAC   Post-op Pain Management: Minimal or no pain anticipated   Induction: Intravenous  PONV Risk Score and Plan: 2 and Propofol infusion, TIVA and Treatment may vary due to age or medical condition  Airway Management Planned: Natural Airway, Nasal Cannula and Simple Face Mask  Additional Equipment: None  Intra-op Plan:   Post-operative Plan:   Informed Consent: I have reviewed the patients History and Physical, chart, labs and discussed the procedure including the risks, benefits and alternatives for the proposed anesthesia with the patient or authorized representative who has indicated his/her understanding and acceptance.       Plan Discussed with:   Anesthesia Plan Comments:         Anesthesia Quick Evaluation

## 2021-03-26 NOTE — Progress Notes (Addendum)
STROKE TEAM PROGRESS NOTE   SUBJECTIVE (INTERVAL HISTORY) Her partner is at the bedside. Patient reports that since discharge, she has had had worsening L sided weakness and difficulty with foot placement while walking with her walker, sometimes dragging her left leg, leading to 2 falls.   This morning, she reports continued L sided weakness as well as numbness and tingling.   Imaging reveals enlargement of prior infarct, now involving posterior limb of R internal capsule and R temporal stem. Unchanged ventriculomegaly.   OBJECTIVE CBC:  Recent Labs  Lab 03/21/21 2132 03/21/21 2148 03/25/21 1347 03/26/21 0154  WBC 10.4   < > 8.9 9.8  NEUTROABS 5.3  --  5.0  --   HGB 14.6   < > 13.1 13.6  HCT 41.9   < > 39.6 39.1  MCV 89.3   < > 91.0 89.5  PLT 258   < > 262 251   < > = values in this interval not displayed.    Basic Metabolic Panel:  Recent Labs  Lab 03/25/21 1347 03/26/21 0154  NA 139 137  K 3.9 3.7  CL 103 102  CO2 28 27  GLUCOSE 101* 139*  BUN 15 17  CREATININE 0.98 0.82  CALCIUM 9.2 9.1    Lipid Panel:  Recent Labs  Lab 03/22/21 0107  CHOL 261*  TRIG 288*  HDL 41  CHOLHDL 6.4  VLDL 58*  LDLCALC 162*    HgbA1c:  Recent Labs  Lab 03/22/21 0107  HGBA1C 6.1*    Urine Drug Screen:  Recent Labs  Lab 03/25/21 2102  LABOPIA NONE DETECTED  COCAINSCRNUR NONE DETECTED  LABBENZ NONE DETECTED  AMPHETMU NONE DETECTED  THCU NONE DETECTED  LABBARB NONE DETECTED     Alcohol Level  Recent Labs  Lab 03/21/21 2132  ETH <10     IMAGING past 24 hours CT Head Wo Contrast  Result Date: 03/25/2021 CLINICAL DATA:  TIA, worsening left-sided weakness EXAM: CT HEAD WITHOUT CONTRAST TECHNIQUE: Contiguous axial images were obtained from the base of the skull through the vertex without intravenous contrast. RADIATION DOSE REDUCTION: This exam was performed according to the departmental dose-optimization program which includes automated exposure control,  adjustment of the mA and/or kV according to patient size and/or use of iterative reconstruction technique. COMPARISON:  03/21/2021 FINDINGS: Brain: There are no signs of bleeding within the cranium. There is abnormal prominence of third and both lateral ventricles. Fourth ventricle is not dilated. Findings may suggest aqueduct stenosis or normal pressure hydrocephalus. Subtle decreased density in right basal ganglia/posterior limb of internal capsule which was not evident in the previous study. There is subtle decreased density in the right frontal cortex with no significant change. Vascular: Unremarkable Skull: Unremarkable. Sinuses/Orbits: Unremarkable. Other: None IMPRESSION: There are no signs of bleeding within the cranium. Small new foci of decreased density are noted in the right basal ganglia and right internal capsule, possibly new areas of ischemia in the right MCA distribution. Follow-up MRI as clinically warranted should be considered. There is dilation of third and both lateral ventricles which may suggest aqueduct stenosis or normal pressure hydrocephalus. Electronically Signed   By: Elmer Picker M.D.   On: 03/25/2021 15:39   MR BRAIN WO CONTRAST  Result Date: 03/25/2021 CLINICAL DATA:  Neuro deficit, acute, stroke suspected. Left-sided body numbness. EXAM: MRI HEAD WITHOUT CONTRAST TECHNIQUE: Multiplanar, multiecho pulse sequences of the brain and surrounding structures were obtained without intravenous contrast. COMPARISON:  Head CT 03/25/2021 and MRI 03/22/2021 FINDINGS:  Multiple sequences are mildly to moderately motion degraded. Brain: An acute infarct involving the posterior limb of the right internal capsule and right temporal stem has enlarged from the prior MRI. The small acute infarct in the left lentiform nucleus on the prior MRI is no longer visible on diffusion-weighted imaging. T2 hyperintensities elsewhere in the cerebral white matter bilaterally and in the pons are unchanged  and are nonspecific but compatible with mild chronic small vessel ischemic disease. Moderate lateral and third ventriculomegaly is unchanged. The cerebral sulci are normal in size. No intracranial hemorrhage, mass, midline shift, or extra-axial fluid collection is identified. Small chronic right cerebellar infarcts are again noted. Vascular: Major intracranial vascular flow voids are preserved. Skull and upper cervical spine: Unremarkable bone marrow signal. Sinuses/Orbits: Unremarkable orbits. Clear paranasal sinuses. Trace left mastoid fluid. Other: None. IMPRESSION: 1. Enlargement of an acute infarct involving the posterior limb of the right internal capsule and right temporal stem since the prior MRI. 2. Mild chronic small vessel ischemic disease with small chronic cerebellar infarcts. 3. Unchanged ventriculomegaly which is nonspecific but could reflect normal pressure hydrocephalus in the appropriate clinical setting. Electronically Signed   By: Logan Bores M.D.   On: 03/25/2021 18:24     PHYSICAL EXAM Temp:  [97.4 F (36.3 C)-98.3 F (36.8 C)] 97.8 F (36.6 C) (02/21 1215) Pulse Rate:  [53-73] 59 (02/21 1320) Resp:  [12-22] 15 (02/21 1320) BP: (149-210)/(66-127) 182/86 (02/21 1320) SpO2:  [93 %-99 %] 94 % (02/21 1320) Weight:  [66 kg] 66 kg (02/20 2130)  General - Well nourished, well developed, age congruent woman in no apparent distress.  Cardiovascular - Regular rhythm and rate on telemetry.  Mental Status -  Level of arousal and orientation to time, place, and person were intact. Language including expression, naming, repetition, comprehension was assessed and found intact.  Cranial Nerves II - XII - II - Visual field intact OU. III, IV, VI - Extraocular movements intact. V - Facial sensation intact bilaterally. VII - Facial movement intact bilaterally. VIII - Hearing & vestibular intact bilaterally. X - Palate elevates symmetrically. XI - Chin turning & shoulder shrug intact  bilaterally. XII - Tongue protrusion intact.  Motor Strength - The patients strength was normal in all R sided extremities and pronator drift was absent. Strength decreased in LUE and LLE; pronator drift present in L arm. Able to bend L knee but unable to maintain in position. Bulk was normal and fasciculations were absent.   Motor Tone - Muscle tone was assessed at the neck and appendages and was normal.  Sensory - Light touch was assessed and asymmetrical; patient felt "tingly" to light touch on L side of face, UE, and LE  Coordination - The patient had some incoordination of FNF in L hand. Intact R hand. Tremor was absent.  Gait and Station - deferred    ASSESSMENT/PLAN Ms. XARIAH SILVERNAIL is a 56 y.o. female with history of diabetes, hyperlipidemia, migraine, GERD, smoker with attempting to cut down, peripheral vascular disease on Plavix re-presenting after d/c on 2/18 with worsening left sided weakness, numbness, and tingling.  Stroke: Extension right BG/CR infarct likely secondary to failed penumbra from last stroke Code Stroke CT head small new foci of dec density in R basal ganglia and R internal capsule Initial CTA head & neck No large vessel occlusion or stenosis. MRI  enlargement of prior infarct, now involving posterior limb of R internal capsule and R temporal stem. Unchanged ventriculomegaly.  2D Echo LVEF  55%, mild LVH, Grade I diastolic dysfunction, Mildly dilated LA, trivial MVR, tricuspid aortic valve TEE unremarkable, no PFO LDL 162 HgbA1c 6.1 VTE prophylaxis - SCDs aspirin 81 mg daily and clopidogrel 75 mg daily prior to initial admission, now on aspirin 81 mg daily and Brilinta (ticagrelor) 90 mg bid x 30 days then resume ASA/Plavix.  Recommend 30 day external monitor Therapy recommendations:  Outpatient PT, no OT Disposition:  Home upon discharge  Ventriculomegaly of lateral and third ventricles Patient with difficulties ambulating, but likely 2/2 stroke  extension vs. NPH Follow-up with outpatient neurology  Hypertension Home meds:  N/A Long-term BP goal normotensive  Hyperlipidemia Home meds:  Repatha, Fenofibrate 135 mg, Zetia 10 mg, Rosuvastatin 40 mg LDL 162, goal < 70 High intensity statin resumed Continue home meds at discharge  Diabetes type II Controlled Home meds:  Metformin 500 mg 2 tablets AM and 1 tablet PM HgbA1c 6.1, goal < 7.0 CBGs SSI, carb modified diet  Tobacco abuse Current smoker Smoking cessation counseling provided Pt is willing to quit  Other Stroke Risk Factors Family hx stroke (Mother, Maternal grandmother) Arterial occlusive disease and claudication of RLE Migraines  Other Active Problems Migraines  Home med: Sumatriptan 100 mg Advised to discontinue home med in the future with concern for stroke.   Hospital day # 0   Rosezetta Schlatter, MD Stroke Neurology- Neuro Psych Resident 03/26/2021 1:36 PM  ATTENDING NOTE: I reviewed above note and agree with the assessment and plan. Pt was seen and examined.   56 year old female with history of diabetes, hypertension, hyperlipidemia, migraine, smoker, PVD on DAPT, recent stroke 5 days ago admitted for worsening left-sided weakness.  On 03/21/2021 patient admitted for left sided weakness and numbness status post TNK.  CT showed enlarged ventricles but no acute abnormality.  CT head and neck unremarkable.  MRI showed bilateral BG small infarcts.  EF 55%, LDL 162 and A1c 6.1.  Discharged on DAPT and home meds for hyperlipidemia including Crestor, Zetia, fenofibrate and Repatha.  Normal neuro exam on discharge.  On this admission, patient reported getting up in the morning with left-sided weakness, and later walking with a walker at home continued left-sided weakness.  CT no ICH, MRI showed right BG->CR extension.  UDS negative.  TEE unremarkable, no PFO.  Creatinine 0.98.  On exam, patient does have mild left hemiparesis, lower extremity more than upper  extremity.  Etiology for patient new extension likely due to failed penumbra from last stroke.  Concerning for some drop of blood pressure with standing position at home.  Orthostatic vital today no orthostatic hypotension.  Would recommend aspirin 81 and Brilinta 90 twice daily for 30 days and then back to aspirin and Plavix DAPT.  Continue home regimen for hyperlipidemia.  PT/OT recommend outpatient PT.  Recommend 30-day CardioNet monitoring as outpatient to rule out A-fib.  For detailed assessment and plan, please refer to above as I have made changes wherever appropriate.   Neurology will sign off. Please call with questions. Pt will follow up with stroke clinic NP at [facility] in about 4 weeks. Thanks for the consult.   Rosalin Hawking, MD PhD Stroke Neurology 03/26/2021 4:03 PM     To contact Stroke Continuity provider, please refer to [email or web address] After hours, contact General Neurology

## 2021-03-26 NOTE — Evaluation (Signed)
Physical Therapy Evaluation Patient Details Name: Hannah Mcdaniel MRN: [id number] DOB: Mar 14, 1965 Today's Date: 03/26/2021  History of Present Illness  Hannah Mcdaniel is a 56 y.o. female with medical history significant for recent bilateral basal ganglia ischemic stroke s/p TNK (03/21/21), T2DM, HLD, PAD (s/p stenting right common and external iliac arteries 10/2020), depression/anxiety, tobacco use who presents to the ED for evaluation of recurrent left-sided weakness. Imaging revealed new foci of decresaed density in R basal ganglia and internal capsule. MRI revealed enlargement of acute infarct involving the posteior limb of R internal capsule and temporal stem   Clinical Impression  Pt with noted L sided weakness, worse in LE compared to UE. Pt with adduction of L LE when advancing L LE during ambulation requiring assist from PT. Pt also with report of impaired sensation by about 60% in L hand LE. Pt with good home set up, support, and DME. Recommend outpt PT to address below deficits. Acute PT to cont to follow.       Recommendations for follow up therapy are one component of a multi-disciplinary discharge planning process, led by the attending physician.  Recommendations may be updated based on patient status, additional functional criteria and insurance authorization.  Follow Up Recommendations Outpatient PT    Assistance Recommended at Discharge Frequent or constant Supervision/Assistance  Patient can return home with the following  A little help with walking and/or transfers;A little help with bathing/dressing/bathroom;Assistance with cooking/housework;Assist for transportation;Help with stairs or ramp for entrance    Equipment Recommendations None recommended by PT  Recommendations for Other Services       Functional Status Assessment Patient has had a recent decline in their functional status and demonstrates the ability to make significant improvements in function in a  reasonable and predictable amount of time.     Precautions / Restrictions Precautions Precautions: Fall Restrictions Weight Bearing Restrictions: No      Mobility  Bed Mobility Overal bed mobility: Needs Assistance Bed Mobility: Supine to Sit     Supine to sit: Min guard     General bed mobility comments: increased time, labored effort, used bed rail on the R, able to bring bilat LEs off EOB    Transfers Overall transfer level: Needs assistance Equipment used: Rolling walker (2 wheels) Transfers: Sit to/from Stand Sit to Stand: Min assist           General transfer comment: verbal cues to push up from bed not pull up from walker. minA to power up and steady during transition of hands from bed to RW    Ambulation/Gait Ambulation/Gait assistance: Min assist Gait Distance (Feet): 15 Feet Assistive device: None Gait Pattern/deviations: Step-through pattern, Drifts right/left, Decreased step length - left, Decreased stance time - left, Decreased dorsiflexion - left Gait velocity: functional Gait velocity interpretation: <1.31 ft/sec, indicative of household ambulator   General Gait Details: pt with noted L adduction to near crossover gait when ambulation, max verbal cues and PT toe tapping where pt's L foot should land to help prevent adduction. pt also with L foot supinating/rolling ankle, PT assist by holding foot down with PT's foot while advancing R LE. pt fatigued quickly stating "this leg" referring to the L one  Stairs            Wheelchair Mobility    Modified Rankin (Stroke Patients Only) Modified Rankin (Stroke Patients Only) Pre-Morbid Rankin Score: Moderate disability Modified Rankin: Moderately severe disability     Balance Overall balance assessment: Needs assistance  Sitting-balance support: No upper extremity supported Sitting balance-Leahy Scale: Good Sitting balance - Comments: pt able to don socks without LOB   Standing balance support:  During functional activity, Bilateral upper extremity supported Standing balance-Leahy Scale: Poor Standing balance comment: requires external support                             Pertinent Vitals/Pain Pain Assessment Pain Assessment: No/denies pain    Home Living Family/patient expects to be discharged to:: Private residence Living Arrangements: Spouse/significant other (daughter, grandkids on the weekend) Available Help at Discharge: Family;Available 24 hours/day (spouse, daughter, sister-in -law) Type of Home: Other(Comment) (double wide trailor) Home Access: Ramped entrance       Home Layout: One level Home Equipment: Cane - single point;Shower Conservator, museum/gallery (2 wheels) Additional Comments: daughter and spouse switching on/off of work    Prior Function Prior Level of Function : Needs assist             Mobility Comments: was using RW ADLs Comments: supervision, set up for ADLs     Hand Dominance   Dominant Hand: Right    Extremity/Trunk Assessment   Upper Extremity Assessment Upper Extremity Assessment: Defer to OT evaluation (reports 60% sensation deficit in hand but was able to don socks and use hands without much difficulty) LUE Coordination: decreased gross motor (mild finger to nose test impairment, delayed,required increased time)    Lower Extremity Assessment Lower Extremity Assessment: LLE deficits/detail LLE Deficits / Details: grossly 4+/5 LLE Sensation: decreased light touch (reports 60% deficit) LLE Coordination: decreased gross motor (difficulty bringing L foot up to don sock)    Cervical / Trunk Assessment Cervical / Trunk Assessment: Normal  Communication   Communication: No difficulties  Cognition Arousal/Alertness: Awake/alert Behavior During Therapy: WFL for tasks assessed/performed Overall Cognitive Status: Within Functional Limits for tasks assessed                                           General Comments General comments (skin integrity, edema, etc.): VSS on RA    Exercises     Assessment/Plan    PT Assessment Patient needs continued PT services  PT Problem List Decreased strength;Decreased balance;Decreased mobility       PT Treatment Interventions Gait training;DME instruction;Stair training;Balance training;Neuromuscular re-education;Patient/family education;Therapeutic activities    PT Goals (Current goals can be found in the Care Plan section)  Acute Rehab PT Goals Patient Stated Goal: to return to prior level of function PT Goal Formulation: With patient Time For Goal Achievement: 04/09/21 Potential to Achieve Goals: Good    Frequency Min 4X/week     Co-evaluation               AM-PAC PT "6 Clicks" Mobility  Outcome Measure Help needed turning from your back to your side while in a flat bed without using bedrails?: None Help needed moving from lying on your back to sitting on the side of a flat bed without using bedrails?: None Help needed moving to and from a bed to a chair (including a wheelchair)?: None Help needed standing up from a chair using your arms (e.g., wheelchair or bedside chair)?: None Help needed to walk in hospital room?: A Little Help needed climbing 3-5 steps with a railing? : A Little 6 Click Score: 22    End of  Session Equipment Utilized During Treatment: Gait belt Activity Tolerance: Patient tolerated treatment well Patient left: in chair;with call bell/phone within reach;with family/visitor present;with chair alarm set Nurse Communication: Mobility status PT Visit Diagnosis: Other abnormalities of gait and mobility (R26.89)    Time: 9242-6834 PT Time Calculation (min) (ACUTE ONLY): 23 min   Charges:   PT Evaluation $PT Eval Moderate Complexity: 1 Mod PT Treatments $Gait Training: 8-22 mins        Kittie Plater, PT, DPT Acute Rehabilitation Services Pager #: [phone] Office #: [phone]   Berline Lopes 03/26/2021, 10:36 AM

## 2021-03-26 NOTE — Progress Notes (Signed)
°  Echocardiogram Echocardiogram Transesophageal has been performed.  Fidel Levy 03/26/2021, 1:32 PM

## 2021-03-26 NOTE — Progress Notes (Signed)
OT impression: Assisted pt to bathroom this date, min guard with RW and mod I for hygiene with lateral leaning. Discussed DME and home set up options with pt and husband. Per husband, pt has access to raised toilet with bilat handles, step in shower with grab bars, hand held shower head and shower seat. He also confirmed that pt will have 24/7 assist at d/c as needed. Pt continues to be limited by impaired balance and LLE coordination. Continue to recommend OP OT, OT to continue to follow acutely.    03/26/21 1500  OT Visit Information  Last OT Received On 03/26/21  Assistance Needed +1  History of Present Illness Hannah Mcdaniel is a 56 y.o. female with medical history significant for recent bilateral basal ganglia ischemic stroke s/p TNK (03/21/21), T2DM, HLD, PAD (s/p stenting right common and external iliac arteries 10/2020), depression/anxiety, tobacco use who presents to the ED for evaluation of recurrent left-sided weakness. Imaging revealed new foci of decresaed density in R basal ganglia and internal capsule. MRI revealed enlargement of acute infarct involving the posteior limb of R internal capsule and temporal stem  Precautions  Precautions Fall  Restrictions  Weight Bearing Restrictions No  Pain Assessment  Pain Assessment No/denies pain  Cognition  Arousal/Alertness Awake/alert  Behavior During Therapy WFL for tasks assessed/performed;Flat affect  Overall Cognitive Status Within Functional Limits for tasks assessed  ADL  Overall ADL's  Needs assistance/impaired  Toilet Transfer Min guard;Rolling walker (2 wheels)  Toilet Transfer Details (indicate cue type and reason) decreased step length/height wtih LLE  Toileting- Clothing Manipulation and Hygiene Modified independent;Sitting/lateral lean  Functional mobility during ADLs Min guard;Rolling walker (2 wheels)   *less than 8 minutes spent with pt therefore no charge applied to session.

## 2021-03-26 NOTE — CV Procedure (Signed)
° ° ° °  TRANSESOPHAGEAL ECHOCARDIOGRAM   NAME:  Hannah Mcdaniel   MRN: [id number] DOB:  1965-07-09   ADMIT DATE: 03/25/2021  INDICATIONS: CVA  PROCEDURE:   Informed consent was obtained prior to the procedure. The risks, benefits and alternatives for the procedure were discussed and the patient comprehended these risks.  Risks include, but are not limited to, cough, sore throat, vomiting, nausea, somnolence, esophageal and stomach trauma or perforation, bleeding, low blood pressure, aspiration, pneumonia, infection, trauma to the teeth and death.    After a procedural time-out, the oropharynx was anesthetized and the patient was sedated by the anesthesia service. The transesophageal probe was inserted in the esophagus and stomach without difficulty and multiple views were obtained. Anesthesia was monitored by Garrison Columbus, CRNA.    COMPLICATIONS:    There were no immediate complications.  FINDINGS:  No cardiac source of embolism seen.  Negative bubble study.  Oswaldo Milian MD [facility]  [address], [place], [place] [address] [phone]   1:09 PM

## 2021-03-26 NOTE — TOC Initial Note (Signed)
Transition of Care [facility]) - Initial/Assessment Note    Patient Details  Name: Hannah Mcdaniel MRN: [id number] Date of Birth: 01-11-1966  Transition of Care [facility]) CM/SW Contact:    Pollie Friar, RN Phone Number: 03/26/2021, 3:22 PM  Clinical Narrative:                 Patient is from home with her spouse with intermittent supervision. She denies issues with transportation or her home medications.  Recommendations for outpatient therapy. Pt was already scheduled with Outpatient Therapy at [facility] to start Thursday. CM has reenter the orders and sent it to [facility] outpatient therapy.  Daughter states pt will be home alone at times due to spouse and daughter working. Spouse states they will make sure she has the supervision she needs.  Pt has transport home when discharged.   Expected Discharge Plan: OP Rehab Barriers to Discharge: Continued Medical Work up   Patient Goals and CMS Choice     Choice offered to / list presented to : Patient, Adult Children, Spouse  Expected Discharge Plan and Services Expected Discharge Plan: OP Rehab   Discharge Planning Services: CM Consult   Living arrangements for the past 2 months: Single Family Home                                      Prior Living Arrangements/Services Living arrangements for the past 2 months: Single Family Home Lives with:: Spouse Patient language and need for interpreter reviewed:: Yes Do you feel safe going back to the place where you live?: Yes      Need for Family Participation in Patient Care: Yes (Comment) Care giver support system in place?: Yes (comment) Current home services: DME (walker/ 3 in 1/ shower seat) Criminal Activity/Legal Involvement Pertinent to Current Situation/Hospitalization: No - Comment as needed  Activities of Daily Living      Permission Sought/Granted                  Emotional Assessment Appearance:: Appears stated age Attitude/Demeanor/Rapport: Engaged Affect  (typically observed): Accepting Orientation: : Oriented to Self, Oriented to Place, Oriented to Situation, Oriented to  Time   Psych Involvement: No (comment)  Admission diagnosis:  Acute CVA (cerebrovascular accident) [facility]) [I63.9] Cerebrovascular accident (CVA), unspecified mechanism ([place]) [I63.9] Patient Active Problem List   Diagnosis Date Noted   Acute CVA (cerebrovascular accident) ([place]) 03/25/2021   Depression with anxiety 03/25/2021   Stroke determined by clinical assessment ([place]) 03/21/2021   Nausea in adult 02/01/2021   Upper respiratory tract infection 02/01/2021   Acute non-recurrent maxillary sinusitis 02/01/2021   Seasonal allergies 04/11/2020   Smoker 05/27/2019   Hyperlipidemia    Vertigo    Migraine without aura and with status migrainosus, not intractable 04/29/2016   DM type 2 with diabetic dyslipidemia ([place]) 01/24/2016   Anxiety, generalized 07/24/2015   Major depressive disorder, recurrent episode, moderate ([employer]) 05/30/2013   PAD (peripheral artery disease) ([place]) 02/09/2013   Atherosclerosis of native artery of extremity with intermittent claudication ([place]) 01/05/2013   PCP:  [name], FNP Pharmacy:   [id number] [place], [place] [address] Phone: [phone] Fax: [phone]     Social Determinants of Health (SDOH) Interventions    Readmission Risk Interventions No flowsheet data found.

## 2021-03-26 NOTE — Plan of Care (Addendum)
Patient remains with noted weakness to LUE, LLE. NIHSS 5 upon my assessment. Will continue to monitor  Problem: Education: Goal: Knowledge of disease or condition will improve Outcome: Progressing   Problem: Coping: Goal: Will verbalize positive feelings about self Outcome: Progressing Goal: Will identify appropriate support needs Outcome: Progressing   Problem: Health Behavior/Discharge Planning: Goal: Ability to manage health-related needs will improve Outcome: Progressing   Problem: Self-Care: Goal: Ability to participate in self-care as condition permits will improve Outcome: Progressing Goal: Verbalization of feelings and concerns over difficulty with self-care will improve Outcome: Progressing Goal: Ability to communicate needs accurately will improve Outcome: Progressing   Problem: Nutrition: Goal: Risk of aspiration will decrease Outcome: Progressing   Problem: Ischemic Stroke/TIA Tissue Perfusion: Goal: Complications of ischemic stroke/TIA will be minimized Outcome: Progressing

## 2021-03-27 ENCOUNTER — Ambulatory Visit: Payer: Self-pay | Admitting: Family Medicine

## 2021-03-27 ENCOUNTER — Encounter (HOSPITAL_COMMUNITY): Payer: Self-pay | Admitting: Cardiology

## 2021-03-27 DIAGNOSIS — E1169 Type 2 diabetes mellitus with other specified complication: Secondary | ICD-10-CM

## 2021-03-27 DIAGNOSIS — E785 Hyperlipidemia, unspecified: Secondary | ICD-10-CM | POA: Diagnosis not present

## 2021-03-27 DIAGNOSIS — I639 Cerebral infarction, unspecified: Secondary | ICD-10-CM | POA: Diagnosis not present

## 2021-03-27 LAB — GLUCOSE, CAPILLARY
Glucose-Capillary: 148 mg/dL — ABNORMAL HIGH (ref 70–99)
Glucose-Capillary: 213 mg/dL — ABNORMAL HIGH (ref 70–99)
Glucose-Capillary: 147 mg/dL — ABNORMAL HIGH (ref 70–99)

## 2021-03-27 LAB — ANTINUCLEAR ANTIBODIES, IFA: ANA Ab, IFA: NEGATIVE

## 2021-03-27 MED ORDER — SENNOSIDES-DOCUSATE SODIUM 8.6-50 MG PO TABS: 1.0000 | ORAL_TABLET | Freq: Every evening | ORAL | Status: DC | PRN

## 2021-03-27 MED ORDER — TICAGRELOR 90 MG PO TABS: 90.0000 mg | ORAL_TABLET | Freq: Two times a day (BID) | ORAL | 0 refills | Status: AC

## 2021-03-27 MED ORDER — CLOPIDOGREL BISULFATE 75 MG PO TABS: 75.0000 mg | ORAL_TABLET | Freq: Every day | ORAL | 11 refills | Status: DC

## 2021-03-27 NOTE — Discharge Summary (Incomplete)
Physician Discharge Summary   Patient: Hannah Mcdaniel MRN: [id number] DOB: 02-20-1965  Admit date:     03/25/2021  Discharge date: {dischdate:26783}  Discharge Physician: Hosie Poisson   PCP: [name], FNP   Recommendations at discharge:  {Tip this will not be part of the note when signed- Example include specific recommendations for outpatient follow-up, pending tests to follow-up on. (Optional):26781}  ***  Discharge Diagnoses: Principal Problem:   Acute CVA (cerebrovascular accident) ([place]) Active Problems:   PAD (peripheral artery disease) ([place])   DM type 2 with diabetic dyslipidemia ([place])   Hyperlipidemia   Depression with anxiety   CVA (cerebral vascular accident) ([place])  Resolved Problems:   * No resolved hospital problems. *   Hospital Course: Hannah Mcdaniel is a 56 y.o. female with medical history significant for recent bilateral basal ganglia ischemic stroke s/p TNK (03/21/21), T2DM, HLD, PAD (s/p stenting right common and external iliac arteries 10/2020), depression/anxiety, tobacco use who is admitted with recurrent left-sided weakness and numbness found to have enlargement of acute infarct involving posterior limb of right internal capsule and right temporal stem.  Admitted for further work-up per neurology.  Assessment and Plan: * Acute CVA (cerebrovascular accident) ([place])- (present on admission) Recent admission for bilateral basal ganglia ischemic stroke s/p TNK 03/21/2021 discharged on DAPT 03/23/2021 presenting with recurrent left-sided numbness and weakness 0600 on 2/20.  MRI brain shows enlargement of acute infarct involving posterior limb of the right internal capsule and right temporal stem. -Neurology following -Continue aspirin 81 mg daily and Plavix 75 mg daily -Continue rosuvastatin 40 mg daily -Allow permissive hypertension for now per neurology -Keep on telemetry, continue neurochecks -Hemoglobin A1c 6.1% on 03/22/2021 -Lipid panel 03/22/2021  showed total cholesterol 261, LDL 162, triglycerides 288, HDL 41 -TTE 03/22/21 showed EF 55%, G1DD  Depression with anxiety- (present on admission) Continue Prozac.  Hyperlipidemia- (present on admission) Continue rosuvastatin and Zetia.  DM type 2 with diabetic dyslipidemia ([place])- (present on admission) Hold metformin, placed on SSI.  PAD (peripheral artery disease) ([place])- (present on admission) S/p stenting to right common and external iliac arteries 10/2020.  Continue aspirin, Plavix, and rosuvastatin.       {Tip this will not be part of the note when signed Body mass index is 27.49 kg/m. , ,  (Optional):26781}  {(NOTE) Pain control PDMP Statment (Optional):26782}  Consultants: *** Procedures performed: ***  Disposition: {Plan; Disposition:26390} Diet recommendation:  Discharge Diet Orders (From admission, onward)     Start     Ordered   03/27/21 0000  Diet - low sodium heart healthy        03/27/21 1028           {Diet_Plan:26776}  DISCHARGE MEDICATION: Allergies as of 03/27/2021       Reactions   Asa [aspirin] Rash   Can take low-dose aspirin.   Bempedoic Acid Other (See Comments)   Bilateral shoulder pain.   Codeine Rash   Penicillins Rash   Has patient had a PCN reaction causing immediate rash, facial/tongue/throat swelling, SOB or lightheadedness with hypotension:Yes Has patient had a PCN reaction causing severe rash involving mucus membranes or skin necrosis:Yes Has patient had a PCN reaction that required hospitalization:No Has patient had a PCN reaction occurring within the last 10 years:No If all of the above answers are "NO", then may proceed with Cephalosporin use.        Medication List     TAKE these medications    aspirin EC 81  MG tablet Take 81 mg by mouth daily.   clopidogrel 75 MG tablet Commonly known as: Plavix Take 1 tablet (75 mg total) by mouth daily. Start taking on: April 26, 2021 What changed: These instructions start  on April 26, 2021. If you are unsure what to do until then, ask your doctor or other care provider.   ezetimibe 10 MG tablet Commonly known as: ZETIA Take 1 tablet (10 mg total) by mouth daily.   FLUoxetine 40 MG capsule Commonly known as: PROZAC Take 1 capsule (40 mg total) by mouth daily.   FreeStyle Libre 2 Reader Devi 1 Device by Does not apply route continuous.   FreeStyle Libre 2 Sensor Misc 1 Device by Does not apply route every 14 (fourteen) days.   levocetirizine 5 MG tablet Commonly known as: XYZAL TAKE 1 TABLET BY MOUTH EVERY DAY IN THE EVENING What changed:  how much to take how to take this when to take this additional instructions   loperamide 2 MG tablet Commonly known as: Imodium A-D Take 1 tablet (2 mg total) by mouth 4 (four) times daily as needed for diarrhea or loose stools.   meclizine 25 MG tablet Commonly known as: ANTIVERT Take 1 tablet (25 mg total) by mouth 3 (three) times daily as needed for dizziness.   metFORMIN 500 MG tablet Commonly known as: GLUCOPHAGE TAKE 2 TABLETS DAILY WITH BREAKFAST AND 1 TABLET DAILY WITH SUPPER. What changed:  how much to take how to take this when to take this additional instructions   montelukast 10 MG tablet Commonly known as: SINGULAIR Take 1 tablet (10 mg total) by mouth at bedtime.   ondansetron 4 MG tablet Commonly known as: Zofran Take 1 tablet (4 mg total) by mouth every 8 (eight) hours as needed for nausea or vomiting.   Repatha SureClick 417 MG/ML So[place] Generic drug: Evolocumab Inject 140 mg into the skin every 14 (fourteen) days.   rosuvastatin 40 MG tablet Commonly known as: CRESTOR Take 1 tablet (40 mg total) by mouth daily.   senna-docusate 8.6-50 MG tablet Commonly known as: Senokot-S Take 1 tablet by mouth at bedtime as needed for mild constipation.   ticagrelor 90 MG Tabs tablet Commonly known as: BRILINTA Take 1 tablet (90 mg total) by mouth 2 (two) times daily.   triamcinolone  cream 0.1 % Commonly known as: KENALOG APPLY TO AFFECTED AREA TWICE A DAY What changed: See the new instructions.        Follow-up Information     [facility] Follow up.   Specialty: Rehabilitation Why: Call to see when your first appointment is scheduled Contact information: [place] [phone] [place] [phone]        [facility]. Schedule an appointment as soon as possible for a visit in 1 month(s).   Specialty: Neurology Why: stroke clinic Contact information: [address] [place] [phone]                Discharge Exam: Danley Danker Weights   03/25/21 1312 03/25/21 2130  Weight: 64.4 kg 66 kg   ***  Condition at discharge: {DC Condition:26389}  The results of significant diagnostics from this hospitalization (including imaging, microbiology, ancillary and laboratory) are listed below for reference.   Imaging Studies: CT Head Wo Contrast  Result Date: 03/25/2021 CLINICAL DATA:  TIA, worsening left-sided weakness EXAM: CT HEAD WITHOUT CONTRAST TECHNIQUE: Contiguous axial images were obtained from the base of the skull  through the vertex without intravenous contrast. RADIATION DOSE REDUCTION: This exam was performed according to the departmental dose-optimization program which includes automated exposure control, adjustment of the mA and/or kV according to patient size and/or use of iterative reconstruction technique. COMPARISON:  03/21/2021 FINDINGS: Brain: There are no signs of bleeding within the cranium. There is abnormal prominence of third and both lateral ventricles. Fourth ventricle is not dilated. Findings may suggest aqueduct stenosis or normal pressure hydrocephalus. Subtle decreased density in right basal ganglia/posterior limb of internal capsule which was not evident in the previous study. There is subtle decreased density in  the right frontal cortex with no significant change. Vascular: Unremarkable Skull: Unremarkable. Sinuses/Orbits: Unremarkable. Other: None IMPRESSION: There are no signs of bleeding within the cranium. Small new foci of decreased density are noted in the right basal ganglia and right internal capsule, possibly new areas of ischemia in the right MCA distribution. Follow-up MRI as clinically warranted should be considered. There is dilation of third and both lateral ventricles which may suggest aqueduct stenosis or normal pressure hydrocephalus. Electronically Signed   By: Elmer Picker M.D.   On: 03/25/2021 15:39   MR BRAIN WO CONTRAST  Result Date: 03/25/2021 CLINICAL DATA:  Neuro deficit, acute, stroke suspected. Left-sided body numbness. EXAM: MRI HEAD WITHOUT CONTRAST TECHNIQUE: Multiplanar, multiecho pulse sequences of the brain and surrounding structures were obtained without intravenous contrast. COMPARISON:  Head CT 03/25/2021 and MRI 03/22/2021 FINDINGS: Multiple sequences are mildly to moderately motion degraded. Brain: An acute infarct involving the posterior limb of the right internal capsule and right temporal stem has enlarged from the prior MRI. The small acute infarct in the left lentiform nucleus on the prior MRI is no longer visible on diffusion-weighted imaging. T2 hyperintensities elsewhere in the cerebral white matter bilaterally and in the pons are unchanged and are nonspecific but compatible with mild chronic small vessel ischemic disease. Moderate lateral and third ventriculomegaly is unchanged. The cerebral sulci are normal in size. No intracranial hemorrhage, mass, midline shift, or extra-axial fluid collection is identified. Small chronic right cerebellar infarcts are again noted. Vascular: Major intracranial vascular flow voids are preserved. Skull and upper cervical spine: Unremarkable bone marrow signal. Sinuses/Orbits: Unremarkable orbits. Clear paranasal sinuses. Trace left  mastoid fluid. Other: None. IMPRESSION: 1. Enlargement of an acute infarct involving the posterior limb of the right internal capsule and right temporal stem since the prior MRI. 2. Mild chronic small vessel ischemic disease with small chronic cerebellar infarcts. 3. Unchanged ventriculomegaly which is nonspecific but could reflect normal pressure hydrocephalus in the appropriate clinical setting. Electronically Signed   By: Logan Bores M.D.   On: 03/25/2021 18:24   MR BRAIN WO CONTRAST  Result Date: 03/23/2021 CLINICAL DATA:  Initial evaluation for neuro deficit, stroke suspected. EXAM: MRI HEAD WITHOUT CONTRAST TECHNIQUE: Multiplanar, multiecho pulse sequences of the brain and surrounding structures were obtained without intravenous contrast. COMPARISON:  Prior CT and CTA from 03/21/2021. FINDINGS: Brain: Examination degraded by motion artifact. Cerebral volume within normal limits. Few small remote right cerebellar infarcts noted. Mild scattered patchy T2/FLAIR signal abnormality seen involving the periventricular and deep white matter both cerebral hemispheres, nonspecific, but most commonly related to chronic microvascular ischemic disease. Mild patchy involvement of the pons. Overall, appearance is mild in nature. Curvilinear focus of restricted diffusion seen involving the posterior right basal ganglia, extending from the right lentiform nucleus towards the right periatrial white matter, consistent with an acute ischemic infarct (series 3, image 25). This measures  up to approximately 2 cm in length. In additional smaller curvilinear focus of diffusion abnormality involving the lateral margin of the contralateral left lentiform nucleus measures up to 1.2 cm, also consistent with an acute ischemic infarct (series 3, image 28). No associated hemorrhage or mass effect. Gray-white matter differentiation otherwise maintained. No other areas of chronic cortical infarction. No acute or chronic intracranial blood  products. No mass lesion or midline shift. Diffuse ventricular prominence, most pronounced at the lateral and third ventricles, out of proportion to cortical sulcation. Finding is nonspecific, but could reflect a degree of NPH. Periventricular FLAIR signal intensity may in part reflect associated transependymal flow of CSF. No extra-axial fluid collection. Pituitary gland suprasellar region within normal limits. Midline structures intact. Vascular: Major intracranial vascular flow voids are maintained. Skull and upper cervical spine: Craniocervical junction within normal limits. Bone marrow signal intensity normal. No scalp soft tissue abnormality. Sinuses/Orbits: Globes and orbital soft tissues within normal limits. Paranasal sinuses are largely clear. Trace left mastoid effusion noted, of doubtful significance. Other: None. IMPRESSION: 1. Acute ischemic infarcts involving the bilateral basal ganglia as above, right larger than left. No associated hemorrhage or mass effect. 2. Diffuse ventricular prominence, out of proportion to cortical sulcation. Finding is nonspecific, but could reflect changes of NPH in the correct clinical setting. No visible structural abnormality about the cerebral aqueduct. 3. Few small remote right cerebellar infarcts. 4. Underlying mild chronic microvascular ischemic disease. Electronically Signed   By: Jeannine Boga M.D.   On: 03/23/2021 01:48   ECHOCARDIOGRAM COMPLETE  Result Date: 03/22/2021    ECHOCARDIOGRAM REPORT   Patient Name:   Hannah Mcdaniel Date of Exam: 03/22/2021 Medical Rec #:  [id number]        Height:       61.0 in Accession #:    [id number]       Weight:       142.0 lb Date of Birth:  1965/09/04        BSA:          1.633 m Patient Age:    56 years         BP:           170/78 mmHg Patient Gender: F                HR:           66 bpm. Exam Location:  Inpatient Procedure: 2D Echo Indications:    Stroke  History:        Patient has no prior history of  Echocardiogram examinations.                 Risk Factors:Dyslipidemia and Diabetes.  Sonographer:    Arlyss Gandy Referring Phys: [id number] [place]  1. Left ventricular ejection fraction, by estimation, is 55%. The left ventricle has normal function. The left ventricle has no regional wall motion abnormalities. There is mild left ventricular hypertrophy. Left ventricular diastolic parameters are consistent with Grade I diastolic dysfunction (impaired relaxation).  2. Right ventricular systolic function is normal. The right ventricular size is normal. Tricuspid regurgitation signal is inadequate for assessing PA pressure.  3. Left atrial size was mildly dilated.  4. The mitral valve is normal in structure. Trivial mitral valve regurgitation. No evidence of mitral stenosis.  5. The aortic valve is tricuspid. Aortic valve regurgitation is not visualized. No aortic stenosis is present.  6. The inferior vena cava is normal in size with greater than 50%  respiratory variability, suggesting right atrial pressure of 3 mmHg. FINDINGS  Left Ventricle: Left ventricular ejection fraction, by estimation, is 55%. The left ventricle has normal function. The left ventricle has no regional wall motion abnormalities. The left ventricular internal cavity size was normal in size. There is mild left ventricular hypertrophy. Left ventricular diastolic parameters are consistent with Grade I diastolic dysfunction (impaired relaxation). Right Ventricle: The right ventricular size is normal. No increase in right ventricular wall thickness. Right ventricular systolic function is normal. Tricuspid regurgitation signal is inadequate for assessing PA pressure. Left Atrium: Left atrial size was mildly dilated. Right Atrium: Right atrial size was normal in size. Pericardium: There is no evidence of pericardial effusion. Mitral Valve: The mitral valve is normal in structure. Trivial mitral valve regurgitation. No evidence of  mitral valve stenosis. Tricuspid Valve: The tricuspid valve is normal in structure. Tricuspid valve regurgitation is trivial. Aortic Valve: The aortic valve is tricuspid. Aortic valve regurgitation is not visualized. No aortic stenosis is present. Aortic valve mean gradient measures 4.0 mmHg. Aortic valve peak gradient measures 7.5 mmHg. Aortic valve area, by VTI measures 1.99 cm. Pulmonic Valve: The pulmonic valve was normal in structure. Pulmonic valve regurgitation is not visualized. Aorta: The aortic root is normal in size and structure. Venous: The inferior vena cava is normal in size with greater than 50% respiratory variability, suggesting right atrial pressure of 3 mmHg. IAS/Shunts: No atrial level shunt detected by color flow Doppler.  LEFT VENTRICLE PLAX 2D LVIDd:         3.90 cm   Diastology LVIDs:         2.80 cm   LV e' medial:    5.22 cm/s LV PW:         0.90 cm   LV E/e' medial:  12.6 LV IVS:        0.90 cm   LV e' lateral:   5.33 cm/s LVOT diam:     2.00 cm   LV E/e' lateral: 12.3 LV SV:         67 LV SV Index:   41 LVOT Area:     3.14 cm  RIGHT VENTRICLE            IVC RV Basal diam:  3.40 cm    IVC diam: 1.70 cm RV Mid diam:    2.80 cm RV S prime:     9.36 cm/s TAPSE (M-mode): 1.8 cm LEFT ATRIUM             Index        RIGHT ATRIUM           Index LA diam:        4.10 cm 2.51 cm/m   RA Area:     12.60 cm LA Vol (A2C):   62.1 ml 38.03 ml/m  RA Volume:   28.20 ml  17.27 ml/m LA Vol (A4C):   45.1 ml 27.62 ml/m LA Biplane Vol: 54.9 ml 33.62 ml/m  AORTIC VALVE AV Area (Vmax):    2.32 cm AV Area (Vmean):   1.89 cm AV Area (VTI):     1.99 cm AV Vmax:           137.00 cm/s AV Vmean:          97.300 cm/s AV VTI:            0.337 m AV Peak Grad:      7.5 mmHg AV Mean Grad:      4.0 mmHg  LVOT Vmax:         101.00 cm/s LVOT Vmean:        58.600 cm/s LVOT VTI:          0.213 m LVOT/AV VTI ratio: 0.63  AORTA Ao Root diam: 2.20 cm Ao Asc diam:  2.90 cm MITRAL VALVE MV Area (PHT): 2.74 cm    SHUNTS MV  Decel Time: 277 msec    Systemic VTI:  0.21 m MV E velocity: 65.60 cm/s  Systemic Diam: 2.00 cm MV A velocity: 86.10 cm/s MV E/A ratio:  0.76 Dalton McleanMD Electronically signed by Franki Monte Signature Date/Time: 03/22/2021/2:38:33 PM    Final    ECHO TEE  Result Date: 03/26/2021    TRANSESOPHOGEAL ECHO REPORT   Patient Name:   Hannah Mcdaniel Date of Exam: 03/26/2021 Medical Rec #:  [id number]        Height:       61.0 in Accession #:    [id number]       Weight:       145.5 lb Date of Birth:  July 05, 1965        BSA:          1.650 m Patient Age:    66 years         BP:           179/78 mmHg Patient Gender: F                HR:           85 bpm. Exam Location:  Inpatient Procedure: Transesophageal Echo, Color Doppler, Cardiac Doppler and Saline            Contrast Bubble Study Indications:     Stroke  History:         Patient has prior history of Echocardiogram examinations, most                  recent 03/22/2021. Risk Factors:Diabetes and Dyslipidemia.  Sonographer:     Bernadene Person RDCS Referring Phys:  [place] Diagnosing Phys: Oswaldo Milian MD PROCEDURE: After discussion of the risks and benefits of a TEE, an informed consent was obtained from the patient. The transesophogeal probe was passed without difficulty through the esophogus of the patient. Sedation performed by different physician. The patient was monitored while under deep sedation. Anesthestetic sedation was provided intravenously by Anesthesiology: 244.37mg  of Propofol. The patient's vital signs; including heart rate, blood pressure, and oxygen saturation; remained stable throughout the procedure. The patient developed no complications during the procedure. IMPRESSIONS  1. Left ventricular ejection fraction, by estimation, is 55 to 60%. The left ventricle has normal function.  2. Right ventricular systolic function is normal. The right ventricular size is normal.  3. Left atrial size was mildly dilated. No left atrial/left  atrial appendage thrombus was detected.  4. The mitral valve is normal in structure. Mild mitral valve regurgitation.  5. The aortic valve is tricuspid. Aortic valve regurgitation is not visualized. No aortic stenosis is present.  6. Agitated saline contrast bubble study was negative, with no evidence of any interatrial shunt. Conclusion(s)/Recommendation(s): No LA/LAA thrombus identified. Negative bubble study for interatrial shunt. No intracardiac source of embolism detected on this on this transesophageal echocardiogram. FINDINGS  Left Ventricle: Left ventricular ejection fraction, by estimation, is 55 to 60%. The left ventricle has normal function. The left ventricular internal cavity size was normal in size. Right Ventricle: The right ventricular size is normal. No  increase in right ventricular wall thickness. Right ventricular systolic function is normal. Left Atrium: Left atrial size was mildly dilated. No left atrial/left atrial appendage thrombus was detected. Right Atrium: Right atrial size was normal in size. Pericardium: There is no evidence of pericardial effusion. Mitral Valve: The mitral valve is normal in structure. Mild mitral valve regurgitation. Tricuspid Valve: The tricuspid valve is normal in structure. Tricuspid valve regurgitation is trivial. Aortic Valve: The aortic valve is tricuspid. Aortic valve regurgitation is not visualized. No aortic stenosis is present. Pulmonic Valve: The pulmonic valve was not well visualized. Pulmonic valve regurgitation is trivial. Aorta: The aortic root and ascending aorta are structurally normal, with no evidence of dilitation. IAS/Shunts: No atrial level shunt detected by color flow Doppler. Agitated saline contrast was given intravenously to evaluate for intracardiac shunting. Agitated saline contrast bubble study was negative, with no evidence of any interatrial shunt. Oswaldo Milian MD Electronically signed by Oswaldo Milian MD Signature  Date/Time: 03/26/2021/2:20:42 PM    Final    CT HEAD CODE STROKE WO CONTRAST  Result Date: 03/21/2021 CLINICAL DATA:  Code stroke.  Acute neurologic deficit EXAM: CT HEAD WITHOUT CONTRAST TECHNIQUE: Contiguous axial images were obtained from the base of the skull through the vertex without intravenous contrast. RADIATION DOSE REDUCTION: This exam was performed according to the departmental dose-optimization program which includes automated exposure control, adjustment of the mA and/or kV according to patient size and/or use of iterative reconstruction technique. COMPARISON:  None. FINDINGS: Brain: There is no mass, hemorrhage or extra-axial collection. Markedly enlarged lateral and third ventricles without a clear source of obstruction. There is cortical hypoattenuation in the right MCA territory. Vascular: No abnormal hyperdensity of the major intracranial arteries or dural venous sinuses. No intracranial atherosclerosis. Skull: The visualized skull base, calvarium and extracranial soft tissues are normal. Sinuses/Orbits: No fluid levels or advanced mucosal thickening of the visualized paranasal sinuses. No mastoid or middle ear effusion. The orbits are normal. ASPECTS [facility] Stroke Program Early CT Score) - Ganglionic level infarction (caudate, lentiform nuclei, internal capsule, insula, M1-M3 cortex): 7 - Supraganglionic infarction (M4-M6 cortex): 2 Total score (0-10 with 10 being normal): 9 IMPRESSION: 1. Early changes of likely right MCA small acute infarct. 2. Markedly enlarged lateral and third ventricles without a clear source of obstruction. This could indicate aqua ductal stenosis. 3. ASPECTS is 9. These results were called by telephone at the time of interpretation on 03/21/2021 at 9:47 pm to provider [facility], who verbally acknowledged these results. Electronically Signed   By: Ulyses Jarred M.D.   On: 03/21/2021 21:48   CT ANGIO HEAD NECK W WO CM (CODE STROKE)  Result Date:  03/21/2021 CLINICAL DATA:  Code stroke EXAM: CT ANGIOGRAPHY HEAD AND NECK TECHNIQUE: Multidetector CT imaging of the head and neck was performed using the standard protocol during bolus administration of intravenous contrast. Multiplanar CT image reconstructions and MIPs were obtained to evaluate the vascular anatomy. Carotid stenosis measurements (when applicable) are obtained utilizing NASCET criteria, using the distal internal carotid diameter as the denominator. RADIATION DOSE REDUCTION: This exam was performed according to the departmental dose-optimization program which includes automated exposure control, adjustment of the mA and/or kV according to patient size and/or use of iterative reconstruction technique. CONTRAST:  70mL OMNIPAQUE IOHEXOL 350 MG/ML SOLN COMPARISON:  None. FINDINGS: CTA NECK FINDINGS SKELETON: There is no bony spinal canal stenosis. No lytic or blastic lesion. OTHER NECK: Normal pharynx, larynx and major salivary glands. No cervical lymphadenopathy. Unremarkable thyroid gland.  UPPER CHEST: No pneumothorax or pleural effusion. No nodules or masses. AORTIC ARCH: There is calcific atherosclerosis of the aortic arch. There is no aneurysm, dissection or hemodynamically significant stenosis of the visualized portion of the aorta. Conventional 3 vessel aortic branching pattern. The visualized proximal subclavian arteries are widely patent. RIGHT CAROTID SYSTEM: Normal without aneurysm, dissection or stenosis. LEFT CAROTID SYSTEM: Normal without aneurysm, dissection or stenosis. VERTEBRAL ARTERIES: Left dominant configuration. Both origins are clearly patent. There is no dissection, occlusion or flow-limiting stenosis to the skull base (V1-V3 segments). CTA HEAD FINDINGS POSTERIOR CIRCULATION: --Vertebral arteries: Normal V4 segments. --Inferior cerebellar arteries: Normal. --Basilar artery: Normal. --Superior cerebellar arteries: Normal. --Posterior cerebral arteries (PCA): Normal. ANTERIOR  CIRCULATION: --Intracranial internal carotid arteries: Normal. --Anterior cerebral arteries (ACA): Normal. Both A1 segments are present. Patent anterior communicating artery (a-comm). --Middle cerebral arteries (MCA): Normal. VENOUS SINUSES: As permitted by contrast timing, patent. ANATOMIC VARIANTS: None Review of the MIP images confirms the above findings. IMPRESSION: 1. No emergent large vessel occlusion or high-grade stenosis of the intracranial arteries. Aortic Atherosclerosis (ICD10-I70.0). Electronically Signed   By: Ulyses Jarred M.D.   On: 03/21/2021 22:27    Microbiology: Results for orders placed or performed during the hospital encounter of 03/25/21  Resp Panel by RT-PCR (Flu A&B, Covid) Nasopharyngeal Swab     Status: None   Collection Time: 03/25/21  1:31 PM   Specimen: Nasopharyngeal Swab; Nasopharyngeal(NP) swabs in vial transport medium  Result Value Ref Range Status   SARS Coronavirus 2 by RT PCR NEGATIVE NEGATIVE Final    Comment: (NOTE) SARS-CoV-2 target nucleic acids are NOT DETECTED.  The SARS-CoV-2 RNA is generally detectable in upper respiratory specimens during the acute phase of infection. The lowest concentration of SARS-CoV-2 viral copies this assay can detect is 138 copies/mL. A negative result does not preclude SARS-Cov-2 infection and should not be used as the sole basis for treatment or other patient management decisions. A negative result may occur with  improper specimen collection/handling, submission of specimen other than nasopharyngeal swab, presence of viral mutation(s) within the areas targeted by this assay, and inadequate number of viral copies(<138 copies/mL). A negative result must be combined with clinical observations, patient history, and epidemiological information. The expected result is Negative.  Fact Sheet for Patients:  EntrepreneurPulse.com.au  Fact Sheet for Healthcare Providers:   IncredibleEmployment.be  This test is no t yet approved or cleared by the [place] FDA and  has been authorized for detection and/or diagnosis of SARS-CoV-2 by FDA under an Emergency Use Authorization (EUA). This EUA will remain  in effect (meaning this test can be used) for the duration of the COVID-19 declaration under Section 564(b)(1) of the Act, 21 U.S.C.section 360bbb-3(b)(1), unless the authorization is terminated  or revoked sooner.       Influenza A by PCR NEGATIVE NEGATIVE Final   Influenza B by PCR NEGATIVE NEGATIVE Final    Comment: (NOTE) The Xpert Xpress SARS-CoV-2/FLU/RSV plus assay is intended as an aid in the diagnosis of influenza from Nasopharyngeal swab specimens and should not be used as a sole basis for treatment. Nasal washings and aspirates are unacceptable for Xpert Xpress SARS-CoV-2/FLU/RSV testing.  Fact Sheet for Patients: EntrepreneurPulse.com.au  Fact Sheet for Healthcare Providers: IncredibleEmployment.be  This test is not yet approved or cleared by the [place] FDA and has been authorized for detection and/or diagnosis of SARS-CoV-2 by FDA under an Emergency Use Authorization (EUA). This EUA will remain in effect (meaning this test can be  used) for the duration of the COVID-19 declaration under Section 564(b)(1) of the Act, 21 U.S.C. section 360bbb-3(b)(1), unless the authorization is terminated or revoked.  Performed at [facility], [place] [address] [place], [place] [address]     Labs: CBC: Recent Labs  Lab 03/21/21 2132 03/21/21 2148 03/22/21 0107 03/23/21 0343 03/25/21 1347 03/26/21 0154  WBC 10.4  --  10.6* 10.2 8.9 9.8  NEUTROABS 5.3  --   --   --  5.0  --   HGB 14.6 14.3 14.6 13.0 13.1 13.6  HCT 41.9 42.0 41.0 37.8 39.6 39.1  MCV 89.3  --  87.2 89.6 91.0 89.5  PLT 258  --  250 244 262 408   Basic Metabolic Panel: Recent Labs  Lab 03/21/21 2132  03/21/21 2148 03/22/21 0107 03/23/21 0343 03/25/21 1347 03/26/21 0154  NA 135 137 136 137 139 137  K 3.4* 3.5 3.1* 4.4 3.9 3.7  CL 101 100 101 104 103 102  CO2 24  --  26 24 28 27   GLUCOSE 145* 142* 148* 152* 101* 139*  BUN 17 18 18 15 15 17   CREATININE 0.83 0.80 0.83 0.92 0.98 0.82  CALCIUM 9.3  --  9.3 8.9 9.2 9.1   Liver Function Tests: Recent Labs  Lab 03/21/21 2132 03/25/21 1347  AST 16 14*  ALT 10 10  ALKPHOS 96 95  BILITOT 0.4 0.5  PROT 6.6 6.6  ALBUMIN 3.7 3.4*   CBG: Recent Labs  Lab 03/26/21 0614 03/26/21 1549 03/26/21 2134 03/27/21 0626 03/27/21 0815  GLUCAP 128* 265* 151* 148* 213*    Discharge time spent: {LESS THAN/GREATER [id number]} 30 minutes.  Signed: Hosie Poisson, MD [facility] 03/27/2021

## 2021-03-27 NOTE — Progress Notes (Signed)
Physical Therapy Treatment Patient Details Name: Hannah Mcdaniel MRN: [id number] DOB: Sep 23, 1965 Today's Date: 03/27/2021   History of Present Illness Hannah Mcdaniel is a 56 y.o. female with medical history significant for recent bilateral basal ganglia ischemic stroke s/p TNK (03/21/21), T2DM, HLD, PAD (s/p stenting right common and external iliac arteries 10/2020), depression/anxiety, tobacco use who presents to the ED for evaluation of recurrent left-sided weakness. Imaging revealed new foci of decresaed density in R basal ganglia and internal capsule. MRI revealed enlargement of acute infarct involving the posteior limb of R internal capsule and temporal stem    PT Comments    Pt progressing well towards all goals. Focused on L foot placement and maintaining foot flat during ambulation as pt with tendency to adduct L LE during swing phase and supinate/laterally roll ankle due to impaired strength and proprioception. Pt with improved ambulation tolerance and strength, reports slight improvement in sensation amongst L UE and LE. Acute PT to cont to follow.   Recommendations for follow up therapy are one component of a multi-disciplinary discharge planning process, led by the attending physician.  Recommendations may be updated based on patient status, additional functional criteria and insurance authorization.  Follow Up Recommendations  Outpatient PT     Assistance Recommended at Discharge Frequent or constant Supervision/Assistance  Patient can return home with the following A little help with walking and/or transfers;A little help with bathing/dressing/bathroom;Assistance with cooking/housework;Assist for transportation;Help with stairs or ramp for entrance   Equipment Recommendations  None recommended by PT    Recommendations for Other Services       Precautions / Restrictions Precautions Precautions: Fall Restrictions Weight Bearing Restrictions: No     Mobility  Bed  Mobility Overal bed mobility: Needs Assistance Bed Mobility: Supine to Sit     Supine to sit: Min guard     General bed mobility comments: increased time, labored effort, used L and R UE to assist    Transfers Overall transfer level: Needs assistance Equipment used: Rolling walker (2 wheels) Transfers: Sit to/from Stand Sit to Stand: Min guard           General transfer comment: verbal cues for safe hand placement, minA to power up and maintain L foot flat/prevent from supination    Ambulation/Gait Ambulation/Gait assistance: Min assist, Mod assist Gait Distance (Feet): 30 Feet Assistive device: Rolling walker (2 wheels) Gait Pattern/deviations: Drifts right/left, Decreased step length - left, Decreased stance time - left, Decreased dorsiflexion - left, Step-to pattern Gait velocity: slow intentionally to focus on foot placement Gait velocity interpretation: <1.31 ft/sec, indicative of household ambulator   General Gait Details: pt's pants rolled up and gown tied in notes to allow for pt to visualize feet as pt with impaired sensation in L foot and rolling ankle. Focused on specific foot placement and keeping foot pronated during stance phase of L LE while advancing R LE. With onset of fatigue pt with increased adduction of L LE and more difficulty maintaining L foot flat on ground requiring minA to prevent foot from laterally rolling.   Stairs             Wheelchair Mobility    Modified Rankin (Stroke Patients Only) Modified Rankin (Stroke Patients Only) Pre-Morbid Rankin Score: Moderate disability Modified Rankin: Moderately severe disability     Balance Overall balance assessment: Needs assistance Sitting-balance support: No upper extremity supported Sitting balance-Leahy Scale: Good Sitting balance - Comments: can move dynamically without LOB   Standing balance support:  Single extremity supported, During functional activity Standing balance-Leahy Scale:  Fair Standing balance comment: requires external support                            Cognition Arousal/Alertness: Awake/alert Behavior During Therapy: WFL for tasks assessed/performed, Flat affect Overall Cognitive Status: Within Functional Limits for tasks assessed                                          Exercises General Exercises - Lower Extremity Ankle Circles/Pumps: AAROM, Left, 10 reps, Seated (tactile cues to dorsiflexors to promote stimulation when trying to actively DF) Long Arc Quad: AROM, Left, 10 reps, Seated (with 5 sec hold at top) Heel Slides: AROM, Left, 10 reps, Seated (against manual resistance)    General Comments General comments (skin integrity, edema, etc.): VSS on RA      Pertinent Vitals/Pain Pain Assessment Pain Assessment: No/denies pain    Home Living                          Prior Function            PT Goals (current goals can now be found in the care plan section) Acute Rehab PT Goals Patient Stated Goal: to return to prior level of function PT Goal Formulation: With patient Time For Goal Achievement: 04/09/21 Potential to Achieve Goals: Good Progress towards PT goals: Progressing toward goals    Frequency    Min 4X/week      PT Plan Current plan remains appropriate    Co-evaluation              AM-PAC PT "6 Clicks" Mobility   Outcome Measure  Help needed turning from your back to your side while in a flat bed without using bedrails?: None Help needed moving from lying on your back to sitting on the side of a flat bed without using bedrails?: None Help needed moving to and from a bed to a chair (including a wheelchair)?: A Little Help needed standing up from a chair using your arms (e.g., wheelchair or bedside chair)?: A Little Help needed to walk in hospital room?: A Little Help needed climbing 3-5 steps with a railing? : A Lot 6 Click Score: 19    End of Session Equipment  Utilized During Treatment: Gait belt Activity Tolerance: Patient tolerated treatment well Patient left: in chair;with call bell/phone within reach;with chair alarm set Nurse Communication: Mobility status PT Visit Diagnosis: Other abnormalities of gait and mobility (R26.89)     Time: 7106-2694 PT Time Calculation (min) (ACUTE ONLY): 18 min  Charges:  $Gait Training: 8-22 mins                     Kittie Plater, PT, DPT Acute Rehabilitation Services Pager #: [phone] Office #: [phone]    Berline Lopes 03/27/2021, 9:11 AM

## 2021-03-27 NOTE — Telephone Encounter (Signed)
She showed up in [place] again today - d/c Hannah Mcdaniel 03/27/21 @ 12:00pm - she has rescheduled hosp f/u now for 03/28/21 @ 11 - check on this please - and do TCM if possible

## 2021-03-27 NOTE — Telephone Encounter (Signed)
Transition Care Management Unsuccessful Follow-up Telephone Call  Date of discharge and from where:  TCM: D/C [name] 03/23/21 - stroke  Attempts:  3rd Attempt  Reason for unsuccessful TCM follow-up call:  Left voice message

## 2021-03-27 NOTE — TOC Transition Note (Signed)
Transition of Care [facility]) - CM/SW Discharge Note   Patient Details  Name: Hannah Mcdaniel MRN: [id number] Date of Birth: [birth year]  [facility]) CM/SW Contact:  Pollie Friar, RN Phone Number: 03/27/2021, 10:45 AM   Clinical Narrative:    Patient is discharging home with resumption of outpatient therapy through [facility].  Pt has transport home and supervision at home.    Final next level of care: OP Rehab Barriers to Discharge: No Barriers Identified   Patient Goals and CMS Choice     Choice offered to / list presented to : Patient, Adult Children, Spouse  Discharge Placement                       Discharge Plan and Services   Discharge Planning Services: CM Consult                                 Social Determinants of Health (SDOH) Interventions     Readmission Risk Interventions No flowsheet data found.

## 2021-03-27 NOTE — Plan of Care (Signed)
°  Problem: Education: Goal: Knowledge of disease or condition will improve Outcome: Adequate for Discharge Goal: Knowledge of secondary prevention will improve (SELECT ALL) Outcome: Adequate for Discharge Goal: Knowledge of patient specific risk factors will improve (INDIVIDUALIZE FOR PATIENT) Outcome: Adequate for Discharge Goal: Individualized Educational Video(s) Outcome: Adequate for Discharge   Problem: Coping: Goal: Will verbalize positive feelings about self Outcome: Adequate for Discharge Goal: Will identify appropriate support needs Outcome: Adequate for Discharge   Problem: Health Behavior/Discharge Planning: Goal: Ability to manage health-related needs will improve Outcome: Adequate for Discharge   Problem: Self-Care: Goal: Ability to participate in self-care as condition permits will improve Outcome: Adequate for Discharge Goal: Verbalization of feelings and concerns over difficulty with self-care will improve Outcome: Adequate for Discharge Goal: Ability to communicate needs accurately will improve Outcome: Adequate for Discharge   Problem: Nutrition: Goal: Risk of aspiration will decrease Outcome: Adequate for Discharge   Problem: Ischemic Stroke/TIA Tissue Perfusion: Goal: Complications of ischemic stroke/TIA will be minimized Outcome: Adequate for Discharge   Problem: Education: Goal: Knowledge of General Education information will improve Description: Including pain rating scale, medication(s)/side effects and non-pharmacologic comfort measures Outcome: Adequate for Discharge   Problem: Health Behavior/Discharge Planning: Goal: Ability to manage health-related needs will improve Outcome: Adequate for Discharge   Problem: Clinical Measurements: Goal: Ability to maintain clinical measurements within normal limits will improve Outcome: Adequate for Discharge Goal: Will remain free from infection Outcome: Adequate for Discharge Goal: Diagnostic test  results will improve Outcome: Adequate for Discharge Goal: Respiratory complications will improve Outcome: Adequate for Discharge Goal: Cardiovascular complication will be avoided Outcome: Adequate for Discharge   Problem: Activity: Goal: Risk for activity intolerance will decrease Outcome: Adequate for Discharge   Problem: Nutrition: Goal: Adequate nutrition will be maintained Outcome: Adequate for Discharge   Problem: Coping: Goal: Level of anxiety will decrease Outcome: Adequate for Discharge   Problem: Elimination: Goal: Will not experience complications related to bowel motility Outcome: Adequate for Discharge Goal: Will not experience complications related to urinary retention Outcome: Adequate for Discharge   Problem: Pain Managment: Goal: General experience of comfort will improve Outcome: Adequate for Discharge   Problem: Safety: Goal: Ability to remain free from injury will improve Outcome: Adequate for Discharge   Problem: Skin Integrity: Goal: Risk for impaired skin integrity will decrease Outcome: Adequate for Discharge

## 2021-03-28 ENCOUNTER — Ambulatory Visit (INDEPENDENT_AMBULATORY_CARE_PROVIDER_SITE_OTHER): Payer: 59

## 2021-03-28 ENCOUNTER — Ambulatory Visit: Payer: 59

## 2021-03-28 ENCOUNTER — Encounter: Payer: Self-pay | Admitting: Family Medicine

## 2021-03-28 ENCOUNTER — Ambulatory Visit: Payer: 59 | Admitting: Family Medicine

## 2021-03-28 VITALS — BP 199/99 | HR 86 | Temp 98.0°F

## 2021-03-28 DIAGNOSIS — Z09 Encounter for follow-up examination after completed treatment for conditions other than malignant neoplasm: Secondary | ICD-10-CM

## 2021-03-28 DIAGNOSIS — I10 Essential (primary) hypertension: Secondary | ICD-10-CM

## 2021-03-28 DIAGNOSIS — W19XXXA Unspecified fall, initial encounter: Secondary | ICD-10-CM | POA: Diagnosis not present

## 2021-03-28 DIAGNOSIS — M542 Cervicalgia: Secondary | ICD-10-CM | POA: Diagnosis not present

## 2021-03-28 DIAGNOSIS — M21372 Foot drop, left foot: Secondary | ICD-10-CM | POA: Diagnosis not present

## 2021-03-28 DIAGNOSIS — I6930 Unspecified sequelae of cerebral infarction: Secondary | ICD-10-CM | POA: Diagnosis not present

## 2021-03-28 DIAGNOSIS — M25372 Other instability, left ankle: Secondary | ICD-10-CM | POA: Diagnosis not present

## 2021-03-28 MED ORDER — LOSARTAN POTASSIUM 50 MG PO TABS: 50.0000 mg | ORAL_TABLET | Freq: Every day | ORAL | 2 refills | Status: DC

## 2021-03-28 NOTE — Discharge Summary (Signed)
Physician Discharge Summary   Patient: Hannah Mcdaniel MRN: [id number] DOB: July 09, 1965  Admit date:     03/25/2021  Discharge date: 03/27/2021  Discharge Physician: Hosie Poisson   PCP: [name], FNP   Recommendations at discharge:  Please follow up with neurology as scheduled.  Please follow up with cardiology for holter monitor and evaluation for atrial fib.  Please follow up with PCP in one week.  Please check CBC and BMP in one week   Discharge Diagnoses: Principal Problem:   Acute CVA (cerebrovascular accident) ([place]) Active Problems:   PAD (peripheral artery disease) ([place])   DM type 2 with diabetic dyslipidemia ([place])   Hyperlipidemia   Depression with anxiety   CVA (cerebral vascular accident) [facility])    Hospital Course: Hannah Mcdaniel is a 56 y.o. female with medical history significant for recent bilateral basal ganglia ischemic stroke s/p TNK (03/21/21), T2DM, HLD, PAD (s/p stenting right common and external iliac arteries 10/2020), depression/anxiety, tobacco use who is admitted with recurrent left-sided weakness and numbness found to have enlargement of acute infarct involving posterior limb of right internal capsule and right temporal stem.  Admitted for further work-up per neurology.  Assessment and Plan: * Acute CVA (cerebrovascular accident) ([place])- (present on admission) Recent admission for bilateral basal ganglia ischemic stroke s/p TNK 03/21/2021 discharged on DAPT 03/23/2021 presenting with recurrent left-sided numbness and weakness 0600 on 2/20.  MRI brain shows enlargement of acute infarct involving posterior limb of the right internal capsule and right temporal stem. -Neurology following, recommended aspirin and Brillinta for 30 days followed by aspirin and plavix.  -Continue rosuvastatin 40 mg daily -Hemoglobin A1c 6.1% on 03/22/2021 -Lipid panel 03/22/2021 showed total cholesterol 261, LDL 162, triglycerides 288, HDL 41 -TTE 03/22/21 showed EF 55%, G1DD -  Neurology recommended Monitor placement to evaluate for atrial fib.  - cardiology notified and aware.   Depression with anxiety- (present on admission) Continue Prozac.  Hyperlipidemia- (present on admission) Continue rosuvastatin and Zetia.  DM type 2 with diabetic dyslipidemia ([place])- (present on admission) Resume home meds.   PAD (peripheral artery disease) ([place])- (present on admission) S/p stenting to right common and external iliac arteries 10/2020.  Continue aspirin,  and rosuvastatin.      Consultants: neurology Procedures performed: TEE  Disposition: Home Diet recommendation:  Discharge Diet Orders (From admission, onward)     Start     Ordered   03/27/21 0000  Diet - low sodium heart healthy        03/27/21 1028           Cardiac diet  DISCHARGE MEDICATION: Allergies as of 03/27/2021       Reactions   Asa [aspirin] Rash   Can take low-dose aspirin.   Bempedoic Acid Other (See Comments)   Bilateral shoulder pain.   Codeine Rash   Penicillins Rash   Has patient had a PCN reaction causing immediate rash, facial/tongue/throat swelling, SOB or lightheadedness with hypotension:Yes Has patient had a PCN reaction causing severe rash involving mucus membranes or skin necrosis:Yes Has patient had a PCN reaction that required hospitalization:No Has patient had a PCN reaction occurring within the last 10 years:No If all of the above answers are "NO", then may proceed with Cephalosporin use.        Medication List     TAKE these medications    aspirin EC 81 MG tablet Take 81 mg by mouth daily.   clopidogrel 75 MG tablet Commonly known as: Plavix Take  1 tablet (75 mg total) by mouth daily. Start taking on: April 26, 2021 What changed: These instructions start on April 26, 2021. If you are unsure what to do until then, ask your doctor or other care provider.   ezetimibe 10 MG tablet Commonly known as: ZETIA Take 1 tablet (10 mg total) by mouth daily.    FLUoxetine 40 MG capsule Commonly known as: PROZAC Take 1 capsule (40 mg total) by mouth daily.   FreeStyle Libre 2 Reader Devi 1 Device by Does not apply route continuous.   FreeStyle Libre 2 Sensor Misc 1 Device by Does not apply route every 14 (fourteen) days.   levocetirizine 5 MG tablet Commonly known as: XYZAL TAKE 1 TABLET BY MOUTH EVERY DAY IN THE EVENING What changed:  how much to take how to take this when to take this additional instructions   loperamide 2 MG tablet Commonly known as: Imodium A-D Take 1 tablet (2 mg total) by mouth 4 (four) times daily as needed for diarrhea or loose stools.   meclizine 25 MG tablet Commonly known as: ANTIVERT Take 1 tablet (25 mg total) by mouth 3 (three) times daily as needed for dizziness.   metFORMIN 500 MG tablet Commonly known as: GLUCOPHAGE TAKE 2 TABLETS DAILY WITH BREAKFAST AND 1 TABLET DAILY WITH SUPPER. What changed:  how much to take how to take this when to take this additional instructions   montelukast 10 MG tablet Commonly known as: SINGULAIR Take 1 tablet (10 mg total) by mouth at bedtime.   ondansetron 4 MG tablet Commonly known as: Zofran Take 1 tablet (4 mg total) by mouth every 8 (eight) hours as needed for nausea or vomiting.   Repatha SureClick 810 MG/ML Soaj Generic drug: Evolocumab Inject 140 mg into the skin every 14 (fourteen) days.   rosuvastatin 40 MG tablet Commonly known as: CRESTOR Take 1 tablet (40 mg total) by mouth daily.   senna-docusate 8.6-50 MG tablet Commonly known as: Senokot-S Take 1 tablet by mouth at bedtime as needed for mild constipation.   ticagrelor 90 MG Tabs tablet Commonly known as: BRILINTA Take 1 tablet (90 mg total) by mouth 2 (two) times daily.   triamcinolone cream 0.1 % Commonly known as: KENALOG APPLY TO AFFECTED AREA TWICE A DAY What changed: See the new instructions.        Follow-up Information     [facility]  Follow up.   Specialty: Rehabilitation Why: Call to see when your first appointment is scheduled Contact information: [place] [id number] [place] [phone]        [facility]. Schedule an appointment as soon as possible for a visit in 1 month(s).   Specialty: Neurology Why: stroke clinic Contact information: [place] [phone]                Discharge Exam: Danley Danker Weights   03/25/21 1312 03/25/21 2130  Weight: 64.4 kg 66 kg   General exam: Appears calm and comfortable  Respiratory system: Clear to auscultation. Respiratory effort normal. Cardiovascular system: S1 & S2 heard, RRR. No JVD, murmurs, rubs, gallops or clicks. No pedal edema. Gastrointestinal system: Abdomen is nondistended, soft and nontender.Normal bowel sounds heard. Central nervous system: Alert and oriented. No focal neurological deficits. Extremities: Symmetric 5 x 5 power. Skin: No rashes, lesions or ulcers Psychiatry: Judgement and insight appear normal. Mood & affect appropriate.    Condition at discharge: fair  The results of significant diagnostics from this hospitalization (including imaging, microbiology, ancillary and laboratory) are listed below for reference.   Imaging Studies: CT Head Wo Contrast  Result Date: 03/25/2021 CLINICAL DATA:  TIA, worsening left-sided weakness EXAM: CT HEAD WITHOUT CONTRAST TECHNIQUE: Contiguous axial images were obtained from the base of the skull through the vertex without intravenous contrast. RADIATION DOSE REDUCTION: This exam was performed according to the departmental dose-optimization program which includes automated exposure control, adjustment of the mA and/or kV according to patient size and/or use of iterative reconstruction technique. COMPARISON:  03/21/2021 FINDINGS: Brain: There are no signs of bleeding within the cranium. There is  abnormal prominence of third and both lateral ventricles. Fourth ventricle is not dilated. Findings may suggest aqueduct stenosis or normal pressure hydrocephalus. Subtle decreased density in right basal ganglia/posterior limb of internal capsule which was not evident in the previous study. There is subtle decreased density in the right frontal cortex with no significant change. Vascular: Unremarkable Skull: Unremarkable. Sinuses/Orbits: Unremarkable. Other: None IMPRESSION: There are no signs of bleeding within the cranium. Small new foci of decreased density are noted in the right basal ganglia and right internal capsule, possibly new areas of ischemia in the right MCA distribution. Follow-up MRI as clinically warranted should be considered. There is dilation of third and both lateral ventricles which may suggest aqueduct stenosis or normal pressure hydrocephalus. Electronically Signed   By: Elmer Picker M.D.   On: 03/25/2021 15:39   MR BRAIN WO CONTRAST  Result Date: 03/25/2021 CLINICAL DATA:  Neuro deficit, acute, stroke suspected. Left-sided body numbness. EXAM: MRI HEAD WITHOUT CONTRAST TECHNIQUE: Multiplanar, multiecho pulse sequences of the brain and surrounding structures were obtained without intravenous contrast. COMPARISON:  Head CT 03/25/2021 and MRI 03/22/2021 FINDINGS: Multiple sequences are mildly to moderately motion degraded. Brain: An acute infarct involving the posterior limb of the right internal capsule and right temporal stem has enlarged from the prior MRI. The small acute infarct in the left lentiform nucleus on the prior MRI is no longer visible on diffusion-weighted imaging. T2 hyperintensities elsewhere in the cerebral white matter bilaterally and in the pons are unchanged and are nonspecific but compatible with mild chronic small vessel ischemic disease. Moderate lateral and third ventriculomegaly is unchanged. The cerebral sulci are normal in size. No intracranial hemorrhage,  mass, midline shift, or extra-axial fluid collection is identified. Small chronic right cerebellar infarcts are again noted. Vascular: Major intracranial vascular flow voids are preserved. Skull and upper cervical spine: Unremarkable bone marrow signal. Sinuses/Orbits: Unremarkable orbits. Clear paranasal sinuses. Trace left mastoid fluid. Other: None. IMPRESSION: 1. Enlargement of an acute infarct involving the posterior limb of the right internal capsule and right temporal stem since the prior MRI. 2. Mild chronic small vessel ischemic disease with small chronic cerebellar infarcts. 3. Unchanged ventriculomegaly which is nonspecific but could reflect normal pressure hydrocephalus in the appropriate clinical setting. Electronically Signed   By: Logan Bores M.D.   On: 03/25/2021 18:24   MR BRAIN WO CONTRAST  Result Date: 03/23/2021 CLINICAL DATA:  Initial evaluation for neuro deficit, stroke suspected. EXAM: MRI HEAD WITHOUT CONTRAST TECHNIQUE: Multiplanar, multiecho pulse sequences of the brain and surrounding structures were obtained without intravenous contrast. COMPARISON:  Prior CT and CTA from 03/21/2021. FINDINGS: Brain: Examination degraded by motion artifact. Cerebral volume within normal limits. Few small remote right cerebellar infarcts noted. Mild scattered patchy T2/FLAIR signal abnormality seen involving the periventricular and deep white matter both cerebral hemispheres, nonspecific, but most  commonly related to chronic microvascular ischemic disease. Mild patchy involvement of the pons. Overall, appearance is mild in nature. Curvilinear focus of restricted diffusion seen involving the posterior right basal ganglia, extending from the right lentiform nucleus towards the right periatrial white matter, consistent with an acute ischemic infarct (series 3, image 25). This measures up to approximately 2 cm in length. In additional smaller curvilinear focus of diffusion abnormality involving the  lateral margin of the contralateral left lentiform nucleus measures up to 1.2 cm, also consistent with an acute ischemic infarct (series 3, image 28). No associated hemorrhage or mass effect. Gray-white matter differentiation otherwise maintained. No other areas of chronic cortical infarction. No acute or chronic intracranial blood products. No mass lesion or midline shift. Diffuse ventricular prominence, most pronounced at the lateral and third ventricles, out of proportion to cortical sulcation. Finding is nonspecific, but could reflect a degree of NPH. Periventricular FLAIR signal intensity may in part reflect associated transependymal flow of CSF. No extra-axial fluid collection. Pituitary gland suprasellar region within normal limits. Midline structures intact. Vascular: Major intracranial vascular flow voids are maintained. Skull and upper cervical spine: Craniocervical junction within normal limits. Bone marrow signal intensity normal. No scalp soft tissue abnormality. Sinuses/Orbits: Globes and orbital soft tissues within normal limits. Paranasal sinuses are largely clear. Trace left mastoid effusion noted, of doubtful significance. Other: None. IMPRESSION: 1. Acute ischemic infarcts involving the bilateral basal ganglia as above, right larger than left. No associated hemorrhage or mass effect. 2. Diffuse ventricular prominence, out of proportion to cortical sulcation. Finding is nonspecific, but could reflect changes of NPH in the correct clinical setting. No visible structural abnormality about the cerebral aqueduct. 3. Few small remote right cerebellar infarcts. 4. Underlying mild chronic microvascular ischemic disease. Electronically Signed   By: Jeannine Boga M.D.   On: 03/23/2021 01:48   ECHOCARDIOGRAM COMPLETE  Result Date: 03/22/2021    ECHOCARDIOGRAM REPORT   Patient Name:   SKI POLICH Date of Exam: 03/22/2021 Medical Rec #:  [id number]        Height:       61.0 in Accession #:     [id number]       Weight:       142.0 lb Date of Birth:  Nov 11, 1965        BSA:          1.633 m Patient Age:    40 years         BP:           170/78 mmHg Patient Gender: F                HR:           66 bpm. Exam Location:  Inpatient Procedure: 2D Echo Indications:    Stroke  History:        Patient has no prior history of Echocardiogram examinations.                 Risk Factors:Dyslipidemia and Diabetes.  Sonographer:    Arlyss Gandy Referring Phys: [id number] [place]  1. Left ventricular ejection fraction, by estimation, is 55%. The left ventricle has normal function. The left ventricle has no regional wall motion abnormalities. There is mild left ventricular hypertrophy. Left ventricular diastolic parameters are consistent with Grade I diastolic dysfunction (impaired relaxation).  2. Right ventricular systolic function is normal. The right ventricular size is normal. Tricuspid regurgitation signal is inadequate for assessing PA pressure.  3. Left  atrial size was mildly dilated.  4. The mitral valve is normal in structure. Trivial mitral valve regurgitation. No evidence of mitral stenosis.  5. The aortic valve is tricuspid. Aortic valve regurgitation is not visualized. No aortic stenosis is present.  6. The inferior vena cava is normal in size with greater than 50% respiratory variability, suggesting right atrial pressure of 3 mmHg. FINDINGS  Left Ventricle: Left ventricular ejection fraction, by estimation, is 55%. The left ventricle has normal function. The left ventricle has no regional wall motion abnormalities. The left ventricular internal cavity size was normal in size. There is mild left ventricular hypertrophy. Left ventricular diastolic parameters are consistent with Grade I diastolic dysfunction (impaired relaxation). Right Ventricle: The right ventricular size is normal. No increase in right ventricular wall thickness. Right ventricular systolic function is normal. Tricuspid  regurgitation signal is inadequate for assessing PA pressure. Left Atrium: Left atrial size was mildly dilated. Right Atrium: Right atrial size was normal in size. Pericardium: There is no evidence of pericardial effusion. Mitral Valve: The mitral valve is normal in structure. Trivial mitral valve regurgitation. No evidence of mitral valve stenosis. Tricuspid Valve: The tricuspid valve is normal in structure. Tricuspid valve regurgitation is trivial. Aortic Valve: The aortic valve is tricuspid. Aortic valve regurgitation is not visualized. No aortic stenosis is present. Aortic valve mean gradient measures 4.0 mmHg. Aortic valve peak gradient measures 7.5 mmHg. Aortic valve area, by VTI measures 1.99 cm. Pulmonic Valve: The pulmonic valve was normal in structure. Pulmonic valve regurgitation is not visualized. Aorta: The aortic root is normal in size and structure. Venous: The inferior vena cava is normal in size with greater than 50% respiratory variability, suggesting right atrial pressure of 3 mmHg. IAS/Shunts: No atrial level shunt detected by color flow Doppler.  LEFT VENTRICLE PLAX 2D LVIDd:         3.90 cm   Diastology LVIDs:         2.80 cm   LV e' medial:    5.22 cm/s LV PW:         0.90 cm   LV E/e' medial:  12.6 LV IVS:        0.90 cm   LV e' lateral:   5.33 cm/s LVOT diam:     2.00 cm   LV E/e' lateral: 12.3 LV SV:         67 LV SV Index:   41 LVOT Area:     3.14 cm  RIGHT VENTRICLE            IVC RV Basal diam:  3.40 cm    IVC diam: 1.70 cm RV Mid diam:    2.80 cm RV S prime:     9.36 cm/s TAPSE (M-mode): 1.8 cm LEFT ATRIUM             Index        RIGHT ATRIUM           Index LA diam:        4.10 cm 2.51 cm/m   RA Area:     12.60 cm LA Vol (A2C):   62.1 ml 38.03 ml/m  RA Volume:   28.20 ml  17.27 ml/m LA Vol (A4C):   45.1 ml 27.62 ml/m LA Biplane Vol: 54.9 ml 33.62 ml/m  AORTIC VALVE AV Area (Vmax):    2.32 cm AV Area (Vmean):   1.89 cm AV Area (VTI):     1.99 cm AV Vmax:  137.00  cm/s AV Vmean:          97.300 cm/s AV VTI:            0.337 m AV Peak Grad:      7.5 mmHg AV Mean Grad:      4.0 mmHg LVOT Vmax:         101.00 cm/s LVOT Vmean:        58.600 cm/s LVOT VTI:          0.213 m LVOT/AV VTI ratio: 0.63  AORTA Ao Root diam: 2.20 cm Ao Asc diam:  2.90 cm MITRAL VALVE MV Area (PHT): 2.74 cm    SHUNTS MV Decel Time: 277 msec    Systemic VTI:  0.21 m MV E velocity: 65.60 cm/s  Systemic Diam: 2.00 cm MV A velocity: 86.10 cm/s MV E/A ratio:  0.76 Dalton McleanMD Electronically signed by Franki Monte Signature Date/Time: 03/22/2021/2:38:33 PM    Final    ECHO TEE  Result Date: 03/26/2021    TRANSESOPHOGEAL ECHO REPORT   Patient Name:   JENETTA WEASE Date of Exam: 03/26/2021 Medical Rec #:  [id number]        Height:       61.0 in Accession #:    [id number]       Weight:       145.5 lb Date of Birth:  07-14-1965        BSA:          1.650 m Patient Age:    59 years         BP:           179/78 mmHg Patient Gender: F                HR:           85 bpm. Exam Location:  Inpatient Procedure: Transesophageal Echo, Color Doppler, Cardiac Doppler and Saline            Contrast Bubble Study Indications:     Stroke  History:         Patient has prior history of Echocardiogram examinations, most                  recent 03/22/2021. Risk Factors:Diabetes and Dyslipidemia.  Sonographer:     Bernadene Person RDCS Referring Phys:  [place] Diagnosing Phys: Oswaldo Milian MD PROCEDURE: After discussion of the risks and benefits of a TEE, an informed consent was obtained from the patient. The transesophogeal probe was passed without difficulty through the esophogus of the patient. Sedation performed by different physician. The patient was monitored while under deep sedation. Anesthestetic sedation was provided intravenously by Anesthesiology: 244.37mg  of Propofol. The patient's vital signs; including heart rate, blood pressure, and oxygen saturation; remained stable throughout the procedure.  The patient developed no complications during the procedure. IMPRESSIONS  1. Left ventricular ejection fraction, by estimation, is 55 to 60%. The left ventricle has normal function.  2. Right ventricular systolic function is normal. The right ventricular size is normal.  3. Left atrial size was mildly dilated. No left atrial/left atrial appendage thrombus was detected.  4. The mitral valve is normal in structure. Mild mitral valve regurgitation.  5. The aortic valve is tricuspid. Aortic valve regurgitation is not visualized. No aortic stenosis is present.  6. Agitated saline contrast bubble study was negative, with no evidence of any interatrial shunt. Conclusion(s)/Recommendation(s): No LA/LAA thrombus identified. Negative bubble study for interatrial shunt. No  intracardiac source of embolism detected on this on this transesophageal echocardiogram. FINDINGS  Left Ventricle: Left ventricular ejection fraction, by estimation, is 55 to 60%. The left ventricle has normal function. The left ventricular internal cavity size was normal in size. Right Ventricle: The right ventricular size is normal. No increase in right ventricular wall thickness. Right ventricular systolic function is normal. Left Atrium: Left atrial size was mildly dilated. No left atrial/left atrial appendage thrombus was detected. Right Atrium: Right atrial size was normal in size. Pericardium: There is no evidence of pericardial effusion. Mitral Valve: The mitral valve is normal in structure. Mild mitral valve regurgitation. Tricuspid Valve: The tricuspid valve is normal in structure. Tricuspid valve regurgitation is trivial. Aortic Valve: The aortic valve is tricuspid. Aortic valve regurgitation is not visualized. No aortic stenosis is present. Pulmonic Valve: The pulmonic valve was not well visualized. Pulmonic valve regurgitation is trivial. Aorta: The aortic root and ascending aorta are structurally normal, with no evidence of dilitation.  IAS/Shunts: No atrial level shunt detected by color flow Doppler. Agitated saline contrast was given intravenously to evaluate for intracardiac shunting. Agitated saline contrast bubble study was negative, with no evidence of any interatrial shunt. Oswaldo Milian MD Electronically signed by Oswaldo Milian MD Signature Date/Time: 03/26/2021/2:20:42 PM    Final    CT HEAD CODE STROKE WO CONTRAST  Result Date: 03/21/2021 CLINICAL DATA:  Code stroke.  Acute neurologic deficit EXAM: CT HEAD WITHOUT CONTRAST TECHNIQUE: Contiguous axial images were obtained from the base of the skull through the vertex without intravenous contrast. RADIATION DOSE REDUCTION: This exam was performed according to the departmental dose-optimization program which includes automated exposure control, adjustment of the mA and/or kV according to patient size and/or use of iterative reconstruction technique. COMPARISON:  None. FINDINGS: Brain: There is no mass, hemorrhage or extra-axial collection. Markedly enlarged lateral and third ventricles without a clear source of obstruction. There is cortical hypoattenuation in the right MCA territory. Vascular: No abnormal hyperdensity of the major intracranial arteries or dural venous sinuses. No intracranial atherosclerosis. Skull: The visualized skull base, calvarium and extracranial soft tissues are normal. Sinuses/Orbits: No fluid levels or advanced mucosal thickening of the visualized paranasal sinuses. No mastoid or middle ear effusion. The orbits are normal. ASPECTS [facility] Stroke Program Early CT Score) - Ganglionic level infarction (caudate, lentiform nuclei, internal capsule, insula, M1-M3 cortex): 7 - Supraganglionic infarction (M4-M6 cortex): 2 Total score (0-10 with 10 being normal): 9 IMPRESSION: 1. Early changes of likely right MCA small acute infarct. 2. Markedly enlarged lateral and third ventricles without a clear source of obstruction. This could indicate aqua ductal  stenosis. 3. ASPECTS is 9. These results were called by telephone at the time of interpretation on 03/21/2021 at 9:47 pm to provider [facility], who verbally acknowledged these results. Electronically Signed   By: Ulyses Jarred M.D.   On: 03/21/2021 21:48   CT ANGIO HEAD NECK W WO CM (CODE STROKE)  Result Date: 03/21/2021 CLINICAL DATA:  Code stroke EXAM: CT ANGIOGRAPHY HEAD AND NECK TECHNIQUE: Multidetector CT imaging of the head and neck was performed using the standard protocol during bolus administration of intravenous contrast. Multiplanar CT image reconstructions and MIPs were obtained to evaluate the vascular anatomy. Carotid stenosis measurements (when applicable) are obtained utilizing NASCET criteria, using the distal internal carotid diameter as the denominator. RADIATION DOSE REDUCTION: This exam was performed according to the departmental dose-optimization program which includes automated exposure control, adjustment of the mA and/or kV according to patient  size and/or use of iterative reconstruction technique. CONTRAST:  30mL OMNIPAQUE IOHEXOL 350 MG/ML SOLN COMPARISON:  None. FINDINGS: CTA NECK FINDINGS SKELETON: There is no bony spinal canal stenosis. No lytic or blastic lesion. OTHER NECK: Normal pharynx, larynx and major salivary glands. No cervical lymphadenopathy. Unremarkable thyroid gland. UPPER CHEST: No pneumothorax or pleural effusion. No nodules or masses. AORTIC ARCH: There is calcific atherosclerosis of the aortic arch. There is no aneurysm, dissection or hemodynamically significant stenosis of the visualized portion of the aorta. Conventional 3 vessel aortic branching pattern. The visualized proximal subclavian arteries are widely patent. RIGHT CAROTID SYSTEM: Normal without aneurysm, dissection or stenosis. LEFT CAROTID SYSTEM: Normal without aneurysm, dissection or stenosis. VERTEBRAL ARTERIES: Left dominant configuration. Both origins are clearly patent. There is no  dissection, occlusion or flow-limiting stenosis to the skull base (V1-V3 segments). CTA HEAD FINDINGS POSTERIOR CIRCULATION: --Vertebral arteries: Normal V4 segments. --Inferior cerebellar arteries: Normal. --Basilar artery: Normal. --Superior cerebellar arteries: Normal. --Posterior cerebral arteries (PCA): Normal. ANTERIOR CIRCULATION: --Intracranial internal carotid arteries: Normal. --Anterior cerebral arteries (ACA): Normal. Both A1 segments are present. Patent anterior communicating artery (a-comm). --Middle cerebral arteries (MCA): Normal. VENOUS SINUSES: As permitted by contrast timing, patent. ANATOMIC VARIANTS: None Review of the MIP images confirms the above findings. IMPRESSION: 1. No emergent large vessel occlusion or high-grade stenosis of the intracranial arteries. Aortic Atherosclerosis (ICD10-I70.0). Electronically Signed   By: Ulyses Jarred M.D.   On: 03/21/2021 22:27    Microbiology: Results for orders placed or performed during the hospital encounter of 03/25/21  Resp Panel by RT-PCR (Flu A&B, Covid) Nasopharyngeal Swab     Status: None   Collection Time: 03/25/21  1:31 PM   Specimen: Nasopharyngeal Swab; Nasopharyngeal(NP) swabs in vial transport medium  Result Value Ref Range Status   SARS Coronavirus 2 by RT PCR NEGATIVE NEGATIVE Final    Comment: (NOTE) SARS-CoV-2 target nucleic acids are NOT DETECTED.  The SARS-CoV-2 RNA is generally detectable in upper respiratory specimens during the acute phase of infection. The lowest concentration of SARS-CoV-2 viral copies this assay can detect is 138 copies/mL. A negative result does not preclude SARS-Cov-2 infection and should not be used as the sole basis for treatment or other patient management decisions. A negative result may occur with  improper specimen collection/handling, submission of specimen other than nasopharyngeal swab, presence of viral mutation(s) within the areas targeted by this assay, and inadequate number of  viral copies(<138 copies/mL). A negative result must be combined with clinical observations, patient history, and epidemiological information. The expected result is Negative.  Fact Sheet for Patients:  EntrepreneurPulse.com.au  Fact Sheet for Healthcare Providers:  IncredibleEmployment.be  This test is no t yet approved or cleared by the [place] FDA and  has been authorized for detection and/or diagnosis of SARS-CoV-2 by FDA under an Emergency Use Authorization (EUA). This EUA will remain  in effect (meaning this test can be used) for the duration of the COVID-19 declaration under Section 564(b)(1) of the Act, 21 U.S.C.section 360bbb-3(b)(1), unless the authorization is terminated  or revoked sooner.       Influenza A by PCR NEGATIVE NEGATIVE Final   Influenza B by PCR NEGATIVE NEGATIVE Final    Comment: (NOTE) The Xpert Xpress SARS-CoV-2/FLU/RSV plus assay is intended as an aid in the diagnosis of influenza from Nasopharyngeal swab specimens and should not be used as a sole basis for treatment. Nasal washings and aspirates are unacceptable for Xpert Xpress SARS-CoV-2/FLU/RSV testing.  Fact Sheet for Patients: EntrepreneurPulse.com.au  Fact Sheet for Healthcare Providers: IncredibleEmployment.be  This test is not yet approved or cleared by the [place] FDA and has been authorized for detection and/or diagnosis of SARS-CoV-2 by FDA under an Emergency Use Authorization (EUA). This EUA will remain in effect (meaning this test can be used) for the duration of the COVID-19 declaration under Section 564(b)(1) of the Act, 21 U.S.C. section 360bbb-3(b)(1), unless the authorization is terminated or revoked.  Performed at [facility], [place] [address], [place], [place] [address]     Labs: CBC: Recent Labs  Lab 03/21/21 2132 03/21/21 2148 03/22/21 0107 03/23/21 0343 03/25/21 1347  03/26/21 0154  WBC 10.4  --  10.6* 10.2 8.9 9.8  NEUTROABS 5.3  --   --   --  5.0  --   HGB 14.6 14.3 14.6 13.0 13.1 13.6  HCT 41.9 42.0 41.0 37.8 39.6 39.1  MCV 89.3  --  87.2 89.6 91.0 89.5  PLT 258  --  250 244 262 707   Basic Metabolic Panel: Recent Labs  Lab 03/21/21 2132 03/21/21 2148 03/22/21 0107 03/23/21 0343 03/25/21 1347 03/26/21 0154  NA 135 137 136 137 139 137  K 3.4* 3.5 3.1* 4.4 3.9 3.7  CL 101 100 101 104 103 102  CO2 24  --  26 24 28 27   GLUCOSE 145* 142* 148* 152* 101* 139*  BUN 17 18 18 15 15 17   CREATININE 0.83 0.80 0.83 0.92 0.98 0.82  CALCIUM 9.3  --  9.3 8.9 9.2 9.1   Liver Function Tests: Recent Labs  Lab 03/21/21 2132 03/25/21 1347  AST 16 14*  ALT 10 10  ALKPHOS 96 95  BILITOT 0.4 0.5  PROT 6.6 6.6  ALBUMIN 3.7 3.4*   CBG: Recent Labs  Lab 03/26/21 1549 03/26/21 2134 03/27/21 0626 03/27/21 0815 03/27/21 1141  GLUCAP 265* 151* 148* 213* 147*    Discharge time spent: 37 minutes  Signed: Hosie Poisson, MD [facility] 03/28/2021

## 2021-03-28 NOTE — Progress Notes (Signed)
Assessment & Plan:  1. History of CVA with residual deficit Patient to keep appointments with cardiology and neurology. She will continue all current medications. Start physical therapy next week. - Ambulatory referral for Orthotics  2. Hospital discharge follow-up  3. Essential hypertension Uncontrolled. Started Losartan 50 mg daily. Encouraged patient to monitor BP at home, keep a log and bring it back with her to her next appointment. - losartan (COZAAR) 50 MG tablet; Take 1 tablet (50 mg total) by mouth daily.  Dispense: 30 tablet; Refill: 2  4. Left foot drop - Ambulatory referral for Orthotics  5. Left ankle gives way - Ambulatory referral for Orthotics  6. Cervical spine pain - DG Cervical Spine Complete  7. Fall, initial encounter - DG Cervical Spine Complete   Return in about 4 weeks (around 04/25/2021) for HTN.  Hendricks Limes, MSN, APRN, FNP-C [facility]  Subjective:    Patient ID: Hannah Mcdaniel, female    DOB: 08-17-1965, 56 y.o.   MRN: [id number]  Patient Care Team: [name], FNP as PCP - General (Family Medicine) Angelia Mould, MD as Consulting Physician (Vascular Surgery) Harlen Labs, MD as Referring Physician (Optometry)   Chief Complaint:  Chief Complaint  Patient presents with   Transitions Of Care    [facility] 2/20-2/22- CVA. C/O weakness.  BP last night laying was 183/76, sitting was 176/94 and standing was 139/99   Neck Pain    After fall this morning and hitting her neck.    brace    Requesting a brace for left foot.     HPI: Hannah Mcdaniel is a 56 y.o. female presenting on 03/28/2021 for Transitions Of Care [facility] 2/20-2/22- CVA. C/O weakness.  BP last night laying was 183/76, sitting was 176/94 and standing was 139/99), Neck Pain (After fall this morning and hitting her neck. ), and brace (Requesting a brace for left foot. )  Patient is accompanied by her daughter, who she is okay with being  present.  Patient was most recently admitted to [facility] 03/25/2021-03/27/2021 for an acute CVA.  Neurology has recommended aspirin and Brilinta for 30 days followed by aspirin and Plavix.  They want her to continue rosuvastatin 40 mg daily.  She is also taking Zetia 10 mg daily and Repatha 140 mg every two weeks (which she has been on x4 months). They also recommended a heart monitor to be placed to evaluate for atrial fibrillation.  Patient reports this is being mailed to the house. She has a follow-up appointment with neurology on 04/23/2021 and cardiology on 05/10/2021. Physical therapy will start on Monday at the office here in [place].   New complaints: Patient reports she fells this morning getting off the toilet. She is having a hard time due to her left sided weakness. States she turned after getting off the toilet and fell backwards, hitting her neck on the bathtub. She is requesting a brace for her left foot.    Social history:  Relevant past medical, surgical, family and social history reviewed and updated as indicated. Interim medical history since our last visit reviewed.  Allergies and medications reviewed and updated.  DATA REVIEWED: CHART IN EPIC  ROS: Negative unless specifically indicated above in HPI.    Current Outpatient Medications:    aspirin EC 81 MG tablet, Take 81 mg by mouth daily., Disp: , Rfl:    [START ON 04/26/2021] clopidogrel (PLAVIX) 75 MG tablet, Take 1 tablet (75 mg  total) by mouth daily., Disp: 30 tablet, Rfl: 11   Continuous Blood Gluc Receiver (FREESTYLE LIBRE 2 READER) DEVI, 1 Device by Does not apply route continuous., Disp: 1 each, Rfl: 0   Continuous Blood Gluc Sensor (FREESTYLE LIBRE 2 SENSOR) MISC, 1 Device by Does not apply route every 14 (fourteen) days., Disp: 2 each, Rfl: 0   Evolocumab (REPATHA SURECLICK) 725 MG/ML SOAJ, Inject 140 mg into the skin every 14 (fourteen) days., Disp: 2 mL, Rfl: 6   ezetimibe (ZETIA) 10 MG tablet, Take 1  tablet (10 mg total) by mouth daily., Disp: 90 tablet, Rfl: 1   FLUoxetine (PROZAC) 40 MG capsule, Take 1 capsule (40 mg total) by mouth daily., Disp: 90 capsule, Rfl: 1   levocetirizine (XYZAL) 5 MG tablet, TAKE 1 TABLET BY MOUTH EVERY DAY IN THE EVENING (Patient taking differently: Take 5 mg by mouth every evening.), Disp: 90 tablet, Rfl: 1   loperamide (IMODIUM A-D) 2 MG tablet, Take 1 tablet (2 mg total) by mouth 4 (four) times daily as needed for diarrhea or loose stools., Disp: 30 tablet, Rfl: 0   meclizine (ANTIVERT) 25 MG tablet, Take 1 tablet (25 mg total) by mouth 3 (three) times daily as needed for dizziness., Disp: 30 tablet, Rfl: 5   metFORMIN (GLUCOPHAGE) 500 MG tablet, TAKE 2 TABLETS DAILY WITH BREAKFAST AND 1 TABLET DAILY WITH SUPPER. (Patient taking differently: Take 500-1,000 mg by mouth See admin instructions. 1000 mg in the morning 500 mg at dinner), Disp: 270 tablet, Rfl: 1   montelukast (SINGULAIR) 10 MG tablet, Take 1 tablet (10 mg total) by mouth at bedtime., Disp: 90 tablet, Rfl: 1   ondansetron (ZOFRAN) 4 MG tablet, Take 1 tablet (4 mg total) by mouth every 8 (eight) hours as needed for nausea or vomiting., Disp: 20 tablet, Rfl: 0   rosuvastatin (CRESTOR) 40 MG tablet, Take 1 tablet (40 mg total) by mouth daily., Disp: 90 tablet, Rfl: 1   senna-docusate (SENOKOT-S) 8.6-50 MG tablet, Take 1 tablet by mouth at bedtime as needed for mild constipation., Disp: , Rfl:    ticagrelor (BRILINTA) 90 MG TABS tablet, Take 1 tablet (90 mg total) by mouth 2 (two) times daily., Disp: 60 tablet, Rfl: 0   triamcinolone cream (KENALOG) 0.1 %, APPLY TO AFFECTED AREA TWICE A DAY (Patient taking differently: Apply 1 application topically 2 (two) times daily as needed (rash).), Disp: 60 g, Rfl: 1   Allergies  Allergen Reactions   Asa [Aspirin] Rash    Can take low-dose aspirin.   Bempedoic Acid Other (See Comments)    Bilateral shoulder pain.   Codeine Rash   Penicillins Rash    Has patient  had a PCN reaction causing immediate rash, facial/tongue/throat swelling, SOB or lightheadedness with hypotension:Yes Has patient had a PCN reaction causing severe rash involving mucus membranes or skin necrosis:Yes Has patient had a PCN reaction that required hospitalization:No Has patient had a PCN reaction occurring within the last 10 years:No If all of the above answers are "NO", then may proceed with Cephalosporin use.    Past Medical History:  Diagnosis Date   Anxiety    Arterial occlusive disease Nov. 2014   Arthritis    Claudication of lower extremity ([place]) Nov. 2014   Right Lower Extremity rest pain   Colon polyps    Depression    Diabetes mellitus without complication (HCC)    GERD (gastroesophageal reflux disease)    Hyperlipidemia    Migraines    Vertigo  Past Surgical History:  Procedure Laterality Date   ABDOMINAL AORTAGRAM N/A 01/10/2013   Procedure: ABDOMINAL AORTAGRAM;  Surgeon: Rosetta Posner, MD;  Location: [facility] CATH LAB;  Service: Cardiovascular;  Laterality: N/A;   ABDOMINAL AORTOGRAM W/LOWER EXTREMITY Bilateral 07/16/2018   Procedure: ABDOMINAL AORTOGRAM W/LOWER EXTREMITY;  Surgeon: Angelia Mould, MD;  Location: [place] CV LAB;  Service: Cardiovascular;  Laterality: Bilateral;   ABDOMINAL AORTOGRAM W/LOWER EXTREMITY N/A 10/05/2020   Procedure: ABDOMINAL AORTOGRAM W/LOWER EXTREMITY;  Surgeon: Angelia Mould, MD;  Location: [place] CV LAB;  Service: Cardiovascular;  Laterality: N/A;   BUBBLE STUDY  03/26/2021   Procedure: BUBBLE STUDY;  Surgeon: Donato Heinz, MD;  Location[facility] ENDOSCOPY;  Service: Cardiovascular;;   CHOLECYSTECTOMY     Gall Bladder   iliac artery angioplasty and stent placement  01/10/13   LOWER EXTREMITY ANGIOGRAM Bilateral 01/10/2013   Procedure: LOWER EXTREMITY ANGIOGRAM;  Surgeon: Rosetta Posner, MD;  Location: [facility] CATH LAB;  Service: Cardiovascular;  Laterality: Bilateral;   PERCUTANEOUS STENT INTERVENTION Right  01/10/2013   Procedure: PERCUTANEOUS STENT INTERVENTION;  Surgeon: Rosetta Posner, MD;  Location: [facility] CATH LAB;  Service: Cardiovascular;  Laterality: Right;  rt common iliac stent   PERIPHERAL VASCULAR CATHETERIZATION N/A 11/26/2015   Procedure: Abdominal Aortogram w/Lower Extremity;  Surgeon: Angelia Mould, MD;  Location: [place] CV LAB;  Service: Cardiovascular;  Laterality: N/A;   PERIPHERAL VASCULAR CATHETERIZATION Right 11/26/2015   Procedure: Peripheral Vascular Balloon Angioplasty;  Surgeon: Angelia Mould, MD;  Location: [place] CV LAB;  Service: Cardiovascular;  Laterality: Right;  rt common iliac   PERIPHERAL VASCULAR INTERVENTION  07/16/2018   Procedure: PERIPHERAL VASCULAR INTERVENTION;  Surgeon: Angelia Mould, MD;  Location: [place] CV LAB;  Service: Cardiovascular;;   PERIPHERAL VASCULAR INTERVENTION Right 10/05/2020   Procedure: PERIPHERAL VASCULAR INTERVENTION;  Surgeon: Angelia Mould, MD;  Location: [place] CV LAB;  Service: Cardiovascular;  Laterality: Right;  Common and exteral iliac artery   TEE WITHOUT CARDIOVERSION N/A 03/26/2021   Procedure: TRANSESOPHAGEAL ECHOCARDIOGRAM (TEE);  Surgeon: Donato Heinz, MD;  Location: [facility] ENDOSCOPY;  Service: Cardiovascular;  Laterality: N/A;   TOTAL ABDOMINAL HYSTERECTOMY      Social History   Socioeconomic History   Marital status: Married    Spouse name: Not on file   Number of children: 3   Years of education: Not on file   Highest education level: Not on file  Occupational History   Not on file  Tobacco Use   Smoking status: Former    Packs/day: 0.25    Types: Cigarettes    Start date: 05/16/1977    Quit date: 03/21/2021    Years since quitting: 0.0   Smokeless tobacco: Never   Tobacco comments:    1/2 cigarette per day  Vaping Use   Vaping Use: Never used  Substance and Sexual Activity   Alcohol use: Yes    Alcohol/week: 1.0 standard drink    Types: 1 Shots of liquor  per week    Comment: occ   Drug use: No   Sexual activity: Yes    Partners: Male    Birth control/protection: Surgical  Other Topics Concern   Not on file  Social History Narrative   Not on file   Social Determinants of Health   Financial Resource Strain: Not on file  Food Insecurity: Not on file  Transportation Needs: Not on file  Physical Activity: Not on file  Stress: Not  on file  Social Connections: Not on file  Intimate Partner Violence: Not on file        Objective:    BP (!) 199/99    Pulse 86    Temp 98 F (36.7 C) (Temporal)    SpO2 97%   Wt Readings from Last 3 Encounters:  03/25/21 145 lb 8.1 oz (66 kg)  03/21/21 141 lb 15.6 oz (64.4 kg)  02/01/21 139 lb 3.2 oz (63.1 kg)    Physical Exam Vitals reviewed.  Constitutional:      General: She is not in acute distress.    Appearance: Normal appearance. She is not ill-appearing, toxic-appearing or diaphoretic.  HENT:     Head: Normocephalic and atraumatic.  Eyes:     General: No scleral icterus.       Right eye: No discharge.        Left eye: No discharge.     Conjunctiva/sclera: Conjunctivae normal.  Cardiovascular:     Rate and Rhythm: Normal rate and regular rhythm.     Heart sounds: Normal heart sounds. No murmur heard.   No friction rub. No gallop.  Pulmonary:     Effort: Pulmonary effort is normal. No respiratory distress.     Breath sounds: Normal breath sounds. No stridor. No wheezing, rhonchi or rales.  Musculoskeletal:        General: Normal range of motion.     Cervical back: Normal range of motion.  Skin:    General: Skin is warm and dry.     Capillary Refill: Capillary refill takes less than 2 seconds.  Neurological:     General: No focal deficit present.     Mental Status: She is alert and oriented to person, place, and time. Mental status is at baseline.     Motor: Weakness (left sided) present.     Gait: Gait abnormal (riding in [facility]).  Psychiatric:        Mood and Affect: Mood  normal.        Behavior: Behavior normal.        Thought Content: Thought content normal.        Judgment: Judgment normal.    Lab Results  Component Value Date   TSH 2.647 03/22/2021   Lab Results  Component Value Date   WBC 9.8 03/26/2021   HGB 13.6 03/26/2021   HCT 39.1 03/26/2021   MCV 89.5 03/26/2021   PLT 251 03/26/2021   Lab Results  Component Value Date   NA 137 03/26/2021   K 3.7 03/26/2021   CO2 27 03/26/2021   GLUCOSE 139 (H) 03/26/2021   BUN 17 03/26/2021   CREATININE 0.82 03/26/2021   BILITOT 0.5 03/25/2021   ALKPHOS 95 03/25/2021   AST 14 (L) 03/25/2021   ALT 10 03/25/2021   PROT 6.6 03/25/2021   ALBUMIN 3.4 (L) 03/25/2021   CALCIUM 9.1 03/26/2021   ANIONGAP 8 03/26/2021   EGFR 97 11/07/2020   Lab Results  Component Value Date   CHOL 261 (H) 03/22/2021   Lab Results  Component Value Date   HDL 41 03/22/2021   Lab Results  Component Value Date   LDLCALC 162 (H) 03/22/2021   Lab Results  Component Value Date   TRIG 288 (H) 03/22/2021   Lab Results  Component Value Date   CHOLHDL 6.4 03/22/2021   Lab Results  Component Value Date   HGBA1C 6.1 (H) 03/22/2021

## 2021-03-29 NOTE — Telephone Encounter (Signed)
Unable to reach patient for TCM call - still okay to do TCM visit today if she shows up for visit.

## 2021-03-31 ENCOUNTER — Other Ambulatory Visit: Payer: Self-pay | Admitting: Family Medicine

## 2021-04-01 ENCOUNTER — Ambulatory Visit: Payer: 59 | Attending: Internal Medicine

## 2021-04-01 ENCOUNTER — Ambulatory Visit (INDEPENDENT_AMBULATORY_CARE_PROVIDER_SITE_OTHER): Payer: 59

## 2021-04-01 ENCOUNTER — Telehealth: Payer: Self-pay | Admitting: Family Medicine

## 2021-04-01 ENCOUNTER — Other Ambulatory Visit: Payer: Self-pay

## 2021-04-01 VITALS — BP 178/85

## 2021-04-01 DIAGNOSIS — I4891 Unspecified atrial fibrillation: Secondary | ICD-10-CM

## 2021-04-01 DIAGNOSIS — M6281 Muscle weakness (generalized): Secondary | ICD-10-CM | POA: Diagnosis not present

## 2021-04-01 DIAGNOSIS — R262 Difficulty in walking, not elsewhere classified: Secondary | ICD-10-CM | POA: Diagnosis not present

## 2021-04-01 DIAGNOSIS — I639 Cerebral infarction, unspecified: Secondary | ICD-10-CM

## 2021-04-01 NOTE — Telephone Encounter (Signed)
I spoke to pt and her daughter and they are saying she started having restless leg syndrome the night after her visit. I did advise them she may need to be seen since this is a new problem but they wanted me to send you a message first since she can't drive.

## 2021-04-01 NOTE — Therapy (Signed)
[facility] [place], [place], [address] Phone: [phone]   Fax:  [phone]  Physical Therapy Evaluation  Patient Details  Name: Hannah Mcdaniel MRN: [id number] Date of Birth: January 17, 1966 Referring Provider (PT): Thereasa Solo, MD   Encounter Date: 04/01/2021   PT End of Session - 04/01/21 1032     Visit Number 1    Number of Visits 16    Date for PT Re-Evaluation 06/07/21    PT Start Time 7510    PT Stop Time 1117    PT Time Calculation (min) 42 min    Activity Tolerance Patient tolerated treatment well    Behavior During Therapy [facility] for tasks assessed/performed             Past Medical History:  Diagnosis Date   Anxiety    Arterial occlusive disease Nov. 2014   Arthritis    Claudication of lower extremity ([place]) Nov. 2014   Right Lower Extremity rest pain   Colon polyps    Depression    Diabetes mellitus without complication ([place])    GERD (gastroesophageal reflux disease)    Hyperlipidemia    Migraines    Vertigo     Past Surgical History:  Procedure Laterality Date   ABDOMINAL AORTAGRAM N/A 01/10/2013   Procedure: ABDOMINAL AORTAGRAM;  Surgeon: Rosetta Posner, MD;  Location: [facility] CATH LAB;  Service: Cardiovascular;  Laterality: N/A;   ABDOMINAL AORTOGRAM W/LOWER EXTREMITY Bilateral 07/16/2018   Procedure: ABDOMINAL AORTOGRAM W/LOWER EXTREMITY;  Surgeon: Angelia Mould, MD;  Location: [place] CV LAB;  Service: Cardiovascular;  Laterality: Bilateral;   ABDOMINAL AORTOGRAM W/LOWER EXTREMITY N/A 10/05/2020   Procedure: ABDOMINAL AORTOGRAM W/LOWER EXTREMITY;  Surgeon: Angelia Mould, MD;  Location: [place] CV LAB;  Service: Cardiovascular;  Laterality: N/A;   BUBBLE STUDY  03/26/2021   Procedure: BUBBLE STUDY;  Surgeon: Donato Heinz, MD;  Location: [facility] ENDOSCOPY;  Service: Cardiovascular;;   CHOLECYSTECTOMY     Gall Bladder   iliac artery angioplasty and stent placement  01/10/13    LOWER EXTREMITY ANGIOGRAM Bilateral 01/10/2013   Procedure: LOWER EXTREMITY ANGIOGRAM;  Surgeon: Rosetta Posner, MD;  Location[facility] CATH LAB;  Service: Cardiovascular;  Laterality: Bilateral;   PERCUTANEOUS STENT INTERVENTION Right 01/10/2013   Procedure: PERCUTANEOUS STENT INTERVENTION;  Surgeon: Rosetta Posner, MD;  Location: [facility] CATH LAB;  Service: Cardiovascular;  Laterality: Right;  rt common iliac stent   PERIPHERAL VASCULAR CATHETERIZATION N/A 11/26/2015   Procedure: Abdominal Aortogram w/Lower Extremity;  Surgeon: Angelia Mould, MD;  Location: [place] CV LAB;  Service: Cardiovascular;  Laterality: N/A;   PERIPHERAL VASCULAR CATHETERIZATION Right 11/26/2015   Procedure: Peripheral Vascular Balloon Angioplasty;  Surgeon: Angelia Mould, MD;  Location: [place] CV LAB;  Service: Cardiovascular;  Laterality: Right;  rt common iliac   PERIPHERAL VASCULAR INTERVENTION  07/16/2018   Procedure: PERIPHERAL VASCULAR INTERVENTION;  Surgeon: Angelia Mould, MD;  Location: [place] CV LAB;  Service: Cardiovascular;;   PERIPHERAL VASCULAR INTERVENTION Right 10/05/2020   Procedure: PERIPHERAL VASCULAR INTERVENTION;  Surgeon: Angelia Mould, MD;  Location: [place] CV LAB;  Service: Cardiovascular;  Laterality: Right;  Common and exteral iliac artery   TEE WITHOUT CARDIOVERSION N/A 03/26/2021   Procedure: TRANSESOPHAGEAL ECHOCARDIOGRAM (TEE);  Surgeon: Donato Heinz, MD;  Location: [place];  Service: Cardiovascular;  Laterality: N/A;   TOTAL ABDOMINAL HYSTERECTOMY      Vitals:   04/01/21 1107  BP: (!) 178/85  Subjective Assessment - 04/01/21 1034     Subjective Patient reports that she had her first stroke on 03/21/21  and was in the hospital until 2/18 and then had another stroke on 1/20. She notes that both strokes affected her left side. She notes that she has tried doing some exercises like weighted LAQs at home. She feels like this has helped  some. She notes that she has to have help to take baths at home. However, she is able to get dressed and move around her house independently. She notes that she has been using a walker to get around since her first stroke.    Pertinent History history of CVA    How long can you stand comfortably? unable to stand without support    Patient Stated Goals walk independently and drive    Currently in Pain? No/denies                [facility] PT Assessment - 04/01/21 0001       Assessment   Medical Diagnosis CVA    Referring Provider (PT) Thereasa Solo, MD    Onset Date/Surgical Date 03/21/21    Hand Dominance Right    Next MD Visit 04/26/21    Prior Therapy No      Precautions   Precautions Knee      Restrictions   Weight Bearing Restrictions No      Balance Screen   Has the patient fallen in the past 6 months Yes    How many times? 4   most recent was last week prior to her follow up with her PCP; x-rays showed no fractures   Has the patient had a decrease in activity level because of a fear of falling?  Yes    Is the patient reluctant to leave their home because of a fear of falling?  No      Home Social worker Private residence    Living Arrangements Children;Spouse/significant other    Home Access Ramped entrance    [place] - 2 wheels;Kasandra Knudsen - single point      Prior Function   Level of Independence Independent    Leisure sewing, making flower arraingments      Cognition   Overall Cognitive Status Within Functional Limits for tasks assessed    Attention Focused    Focused Attention Appears intact    Memory Appears intact    Awareness Appears intact    Problem Solving Appears intact      Sensation   Light Touch Impaired by gross assessment   diminished sensation on left upper and lower extremities; absent sensation at left ankle   Additional Comments Patient reports reports tingling in her left hand      ROM /  Strength   AROM / PROM / Strength Strength;AROM      AROM   Overall AROM  Within functional limits for tasks performed      Strength   Strength Assessment Site Hip;Knee;Ankle;Hand;Shoulder;Elbow    Right/Left Shoulder Right;Left    Right Shoulder Flexion 4-/5    Left Shoulder Flexion 3/5    Right/Left hand Right;Left    Right Hand Grip (lbs) 40    Left Hand Grip (lbs) 20    Right/Left Hip Right;Left    Right Hip Flexion 4/5    Left Hip Flexion 3/5    Right/Left Knee Right;Left    Right Knee Flexion 4+/5    Right  Knee Extension 4+/5    Left Knee Flexion 3/5    Left Knee Extension 3/5    Right/Left Ankle Left;Right    Right Ankle Dorsiflexion 4/5    Left Ankle Dorsiflexion 3/5      Transfers   Transfers Sit to Stand;Stand to Sit    Sit to Stand 6: Modified independent (Device/Increase time);With upper extremity assist;With armrests    Five time sit to stand comments  36 seconds    Stand to Sit 6: Modified independent (Device/Increase time);With armrests;With upper extremity assist      Ambulation/Gait   Ambulation/Gait Yes    Ambulation/Gait Assistance 6: Modified independent (Device/Increase time)    Assistive device Rolling walker    Gait Pattern Step-to pattern;Decreased stride length;Decreased stance time - left;Decreased hip/knee flexion - left;Decreased dorsiflexion - left;Decreased weight shift to left;Left genu recurvatum;Wide base of support;Poor foot clearance - left;Abducted - left    Ambulation Surface Level;Indoor    Gait velocity decreased                        Objective measurements completed on examination: See above findings.                     PT Long Term Goals - 04/01/21 1124       PT LONG TERM GOAL #1   Title Patient will be independent with her HEP.    Time 6    Period Weeks    Status New    Target Date 05/13/21      PT LONG TERM GOAL #2   Title Patient will be able to safely ambulate at least 80 feet with a  cane or least restrictive device for improved household mobility.    Time 6    Period Weeks    Status New    Target Date 05/13/21      PT LONG TERM GOAL #3   Title Patient will improve her grip strength of her left hand to at least 30 pounds for improved functional use of her LUE.    Time 6    Period Weeks    Status New    Target Date 05/13/21      PT LONG TERM GOAL #4   Title Patient will improve her 5x sit to stand time to 25 seconds or less for improved safety with transfers.    Time 6    Period Weeks    Status New    Target Date 05/13/21                    Plan - 04/01/21 1110     Clinical Impression Statement Patient is a 56 year old female presenting to physical therapy following a CVA on 2/16 and 2/20. She exhibited significand left sided upper and lower extremity weakness. However, no significant AROM limitations with any of today's assessments. She requires a rolling walker for mobility and exhibits poor quadriceps control with as evidenced by left knee hyperextension with left lower extremity weight bearing. Recommend that she continue with skilled physical therapy to address her remaining impairments to maximize her functional mobitiy.    Personal Factors and Comorbidities Transportation;Comorbidity 3+    Comorbidities Multiple CVA's, PAD, DM, smoker,    Examination-Activity Limitations Locomotion Level;Transfers;Bathing;Caring for Others;Carry;Hygiene/Grooming    Examination-Participation Restrictions Meal Prep;Cleaning;Community Activity;Shop;Driving;[facility]    Stability/Clinical Decision Making Evolving/Moderate complexity    Clinical Decision Making Moderate    Rehab Potential  Good    PT Frequency 3x / week   2-3x / week   PT Duration 6 weeks    PT Treatment/Interventions ADLs/Self Care Home Management;Electrical Stimulation;Gait training;Stair training;Functional mobility training;Therapeutic activities;Therapeutic exercise;Balance  training;Neuromuscular re-education;Patient/family education;Energy conservation;Taping    PT Next Visit Plan monitor BP: nustep (upper and lower extremities), upper and lower extremity strengthening, gait training    Consulted and Agree with Plan of Care Patient             Patient will benefit from skilled therapeutic intervention in order to improve the following deficits and impairments:  Abnormal gait, Difficulty walking, Impaired tone, Impaired UE functional use, Decreased activity tolerance, Decreased balance, Impaired sensation, Decreased strength, Decreased mobility  Visit Diagnosis: Muscle weakness (generalized)  Difficulty in walking, not elsewhere classified     Problem List Patient Active Problem List   Diagnosis Date Noted   CVA (cerebral vascular accident) ([place]) 03/26/2021   Acute CVA (cerebrovascular accident) ([place]) 03/25/2021   Depression with anxiety 03/25/2021   Stroke determined by clinical assessment ([place]) 03/21/2021   Nausea in adult 02/01/2021   Upper respiratory tract infection 02/01/2021   Acute non-recurrent maxillary sinusitis 02/01/2021   Seasonal allergies 04/11/2020   Smoker 05/27/2019   Hyperlipidemia    Vertigo    Migraine without aura and with status migrainosus, not intractable 04/29/2016   DM type 2 with diabetic dyslipidemia ([place]) 01/24/2016   Anxiety, generalized 07/24/2015   Major depressive disorder, recurrent episode, moderate ([place]) 05/30/2013   PAD (peripheral artery disease) ([place]) 02/09/2013   Atherosclerosis of native artery of extremity with intermittent claudication ([place]) 01/05/2013    Darlin Coco, PT 04/01/2021, 11:48 AM  [facility] [place], [place], [address] Phone: [phone]   Fax:  [phone]  Name: Hannah Mcdaniel MRN: [id number] Date of Birth: [birth year]

## 2021-04-01 NOTE — Telephone Encounter (Signed)
She will need a visit. I will have to do more lab work before treating.

## 2021-04-02 ENCOUNTER — Other Ambulatory Visit: Payer: Self-pay | Admitting: Family Medicine

## 2021-04-02 ENCOUNTER — Telehealth: Payer: Self-pay | Admitting: *Deleted

## 2021-04-02 DIAGNOSIS — F331 Major depressive disorder, recurrent, moderate: Secondary | ICD-10-CM

## 2021-04-02 DIAGNOSIS — F411 Generalized anxiety disorder: Secondary | ICD-10-CM

## 2021-04-02 NOTE — Telephone Encounter (Signed)
Return call to daughter from when she stopped in yesterday. She asked about foot brace for pt from [place].There is a referral placed to the [facility]. Britney does want pt to have an evaluation by the [facility] and not just an order for a foot brace. Daughter aware and the [facility] called yesterday afternoon, pt has an appt set up for March and she is coming in to see PCP for RLS.

## 2021-04-02 NOTE — Telephone Encounter (Signed)
Appointment scheduled.

## 2021-04-03 ENCOUNTER — Ambulatory Visit: Payer: 59 | Attending: Internal Medicine

## 2021-04-03 ENCOUNTER — Encounter: Payer: Self-pay | Admitting: Family Medicine

## 2021-04-03 ENCOUNTER — Ambulatory Visit (INDEPENDENT_AMBULATORY_CARE_PROVIDER_SITE_OTHER): Payer: 59 | Admitting: Family Medicine

## 2021-04-03 ENCOUNTER — Other Ambulatory Visit: Payer: Self-pay

## 2021-04-03 VITALS — BP 153/79 | HR 73 | Temp 97.3°F | Ht 61.0 in | Wt 134.0 lb

## 2021-04-03 DIAGNOSIS — E1169 Type 2 diabetes mellitus with other specified complication: Secondary | ICD-10-CM | POA: Diagnosis not present

## 2021-04-03 DIAGNOSIS — E785 Hyperlipidemia, unspecified: Secondary | ICD-10-CM

## 2021-04-03 DIAGNOSIS — M6281 Muscle weakness (generalized): Secondary | ICD-10-CM | POA: Diagnosis not present

## 2021-04-03 DIAGNOSIS — G2581 Restless legs syndrome: Secondary | ICD-10-CM

## 2021-04-03 DIAGNOSIS — R262 Difficulty in walking, not elsewhere classified: Secondary | ICD-10-CM | POA: Diagnosis not present

## 2021-04-03 MED ORDER — FREESTYLE LIBRE 2 SENSOR MISC: 1.0000 | 5 refills | Status: DC

## 2021-04-03 NOTE — Progress Notes (Signed)
? ?Assessment & Plan:  ?1. Restless legs ?Lab work to assess for potential cause before starting medication.  Education provided on restless leg syndrome. ?- Anemia Profile B ?- CMP14+EGFR ?- TSH ?- Magnesium ? ? ?Follow up plan: Return as scheduled. ? ?Hendricks Limes, MSN, APRN, FNP-C ?[place] ? ?Subjective:  ? ?Patient ID: Hannah Mcdaniel, female    DOB: 1965/09/02, 56 y.o.   MRN: [id number] ? ?HPI: ?Hannah Mcdaniel is a 56 y.o. female presenting on 04/03/2021 for RLS (Bilateral x 6 days ) ? ?Patient reports she started having restless legs six days ago. Symptoms occur at night. She is constantly moving her legs and can't stop.  ? ? ?ROS: Negative unless specifically indicated above in HPI.  ? ?Relevant past medical history reviewed and updated as indicated.  ? ?Allergies and medications reviewed and updated. ? ? ?Current Outpatient Medications:  ?  aspirin EC 81 MG tablet, Take 81 mg by mouth daily., Disp: , Rfl:  ?  [START ON 04/26/2021] clopidogrel (PLAVIX) 75 MG tablet, Take 1 tablet (75 mg total) by mouth daily., Disp: 30 tablet, Rfl: 11 ?  Continuous Blood Gluc Receiver (FREESTYLE LIBRE 2 READER) DEVI, 1 Device by Does not apply route continuous., Disp: 1 each, Rfl: 0 ?  Continuous Blood Gluc Sensor (FREESTYLE LIBRE 2 SENSOR) MISC, 1 Device by Does not apply route every 14 (fourteen) days., Disp: 2 each, Rfl: 0 ?  Evolocumab (REPATHA SURECLICK) 720 MG/ML SOAJ, Inject 140 mg into the skin every 14 (fourteen) days., Disp: 2 mL, Rfl: 6 ?  ezetimibe (ZETIA) 10 MG tablet, Take 1 tablet (10 mg total) by mouth daily., Disp: 90 tablet, Rfl: 1 ?  FLUoxetine (PROZAC) 40 MG capsule, Take 1 capsule (40 mg total) by mouth daily., Disp: 90 capsule, Rfl: 1 ?  levocetirizine (XYZAL) 5 MG tablet, TAKE 1 TABLET BY MOUTH EVERY DAY IN THE EVENING (Patient taking differently: Take 5 mg by mouth every evening.), Disp: 90 tablet, Rfl: 1 ?  loperamide (IMODIUM A-D) 2 MG tablet, Take 1 tablet (2 mg total) by  mouth 4 (four) times daily as needed for diarrhea or loose stools., Disp: 30 tablet, Rfl: 0 ?  losartan (COZAAR) 50 MG tablet, Take 1 tablet (50 mg total) by mouth daily., Disp: 30 tablet, Rfl: 2 ?  meclizine (ANTIVERT) 25 MG tablet, Take 1 tablet (25 mg total) by mouth 3 (three) times daily as needed for dizziness., Disp: 30 tablet, Rfl: 5 ?  metFORMIN (GLUCOPHAGE) 500 MG tablet, TAKE 2 TABLETS DAILY WITH BREAKFAST AND 1 TABLET DAILY WITH SUPPER. (Patient taking differently: Take 500-1,000 mg by mouth See admin instructions. 1000 mg in the morning 500 mg at dinner), Disp: 270 tablet, Rfl: 1 ?  montelukast (SINGULAIR) 10 MG tablet, Take 1 tablet (10 mg total) by mouth at bedtime., Disp: 90 tablet, Rfl: 1 ?  ondansetron (ZOFRAN) 4 MG tablet, Take 1 tablet (4 mg total) by mouth every 8 (eight) hours as needed for nausea or vomiting., Disp: 20 tablet, Rfl: 0 ?  rosuvastatin (CRESTOR) 40 MG tablet, Take 1 tablet (40 mg total) by mouth daily., Disp: 90 tablet, Rfl: 1 ?  senna-docusate (SENOKOT-S) 8.6-50 MG tablet, Take 1 tablet by mouth at bedtime as needed for mild constipation., Disp: , Rfl:  ?  ticagrelor (BRILINTA) 90 MG TABS tablet, Take 1 tablet (90 mg total) by mouth 2 (two) times daily., Disp: 60 tablet, Rfl: 0 ?  triamcinolone cream (KENALOG) 0.1 %, APPLY TO AFFECTED  AREA TWICE A DAY (Patient taking differently: Apply 1 application topically 2 (two) times daily as needed (rash).), Disp: 60 g, Rfl: 1 ? ?Allergies  ?Allergen Reactions  ? Asa [Aspirin] Rash  ?  Can take low-dose aspirin.  ? Bempedoic Acid Other (See Comments)  ?  Bilateral shoulder pain.  ? Codeine Rash  ? Penicillins Rash  ?  Has patient had a PCN reaction causing immediate rash, facial/tongue/throat swelling, SOB or lightheadedness with hypotension:Yes ?Has patient had a PCN reaction causing severe rash involving mucus membranes or skin necrosis:Yes ?Has patient had a PCN reaction that required hospitalization:No ?Has patient had a PCN reaction  occurring within the last 10 years:No ?If all of the above answers are "NO", then may proceed with Cephalosporin use. ?  ? ? ?Objective:  ? ?BP (!) 153/79   Pulse 73   Temp (!) 97.3 ?F (36.3 ?C) (Temporal)   Ht _0  (1.549 m)   Wt 134 lb (60.8 kg)   BMI 25.32 kg/m?   ? ?Physical Exam ?Vitals reviewed.  ?Constitutional:   ?   General: She is not in acute distress. ?   Appearance: Normal appearance. She is not ill-appearing, toxic-appearing or diaphoretic.  ?HENT:  ?   Head: Normocephalic and atraumatic.  ?Eyes:  ?   General: No scleral icterus.    ?   Right eye: No discharge.     ?   Left eye: No discharge.  ?   Conjunctiva/sclera: Conjunctivae normal.  ?Cardiovascular:  ?   Rate and Rhythm: Normal rate.  ?Pulmonary:  ?   Effort: Pulmonary effort is normal. No respiratory distress.  ?Musculoskeletal:     ?   General: Normal range of motion.  ?   Cervical back: Normal range of motion.  ?Skin: ?   General: Skin is warm and dry.  ?   Capillary Refill: Capillary refill takes less than 2 seconds.  ?Neurological:  ?   General: No focal deficit present.  ?   Mental Status: She is alert and oriented to person, place, and time. Mental status is at baseline.  ?   Motor: Weakness (left sided) present.  ?Psychiatric:     ?   Mood and Affect: Mood normal.     ?   Behavior: Behavior normal.     ?   Thought Content: Thought content normal.     ?   Judgment: Judgment normal.  ? ? ? ? ? ? ?

## 2021-04-03 NOTE — Therapy (Signed)
[facility] [place], [place], [address] Phone: [phone]   Fax:  [phone]  Physical Therapy Treatment  Patient Details  Name: Hannah Mcdaniel MRN: [id number] Date of Birth: 1965/02/06 Referring Provider (PT): Thereasa Solo, MD   Encounter Date: 04/03/2021   PT End of Session - 04/03/21 1034     Visit Number 2    Number of Visits 16    Date for PT Re-Evaluation 06/07/21    PT Start Time 1030    PT Stop Time 1113    PT Time Calculation (min) 43 min    Activity Tolerance Patient tolerated treatment well    Behavior During Therapy [facility] for tasks assessed/performed             Past Medical History:  Diagnosis Date   Anxiety    Arterial occlusive disease Nov. 2014   Arthritis    Claudication of lower extremity ([place]) Nov. 2014   Right Lower Extremity rest pain   Colon polyps    Depression    Diabetes mellitus without complication ([place])    GERD (gastroesophageal reflux disease)    Hyperlipidemia    Migraines    Vertigo     Past Surgical History:  Procedure Laterality Date   ABDOMINAL AORTAGRAM N/A 01/10/2013   Procedure: ABDOMINAL AORTAGRAM;  Surgeon: Rosetta Posner, MD;  Location: [facility] CATH LAB;  Service: Cardiovascular;  Laterality: N/A;   ABDOMINAL AORTOGRAM W/LOWER EXTREMITY Bilateral 07/16/2018   Procedure: ABDOMINAL AORTOGRAM W/LOWER EXTREMITY;  Surgeon: Angelia Mould, MD;  Location: [place] CV LAB;  Service: Cardiovascular;  Laterality: Bilateral;   ABDOMINAL AORTOGRAM W/LOWER EXTREMITY N/A 10/05/2020   Procedure: ABDOMINAL AORTOGRAM W/LOWER EXTREMITY;  Surgeon: Angelia Mould, MD;  Location: [place] CV LAB;  Service: Cardiovascular;  Laterality: N/A;   BUBBLE STUDY  03/26/2021   Procedure: BUBBLE STUDY;  Surgeon: Donato Heinz, MD;  Location[facility] ENDOSCOPY;  Service: Cardiovascular;;   CHOLECYSTECTOMY     Gall Bladder   iliac artery angioplasty and stent placement  01/10/13    LOWER EXTREMITY ANGIOGRAM Bilateral 01/10/2013   Procedure: LOWER EXTREMITY ANGIOGRAM;  Surgeon: Rosetta Posner, MD;  Location: [facility] CATH LAB;  Service: Cardiovascular;  Laterality: Bilateral;   PERCUTANEOUS STENT INTERVENTION Right 01/10/2013   Procedure: PERCUTANEOUS STENT INTERVENTION;  Surgeon: Rosetta Posner, MD;  Location: [facility] CATH LAB;  Service: Cardiovascular;  Laterality: Right;  rt common iliac stent   PERIPHERAL VASCULAR CATHETERIZATION N/A 11/26/2015   Procedure: Abdominal Aortogram w/Lower Extremity;  Surgeon: Angelia Mould, MD;  Location: [place] CV LAB;  Service: Cardiovascular;  Laterality: N/A;   PERIPHERAL VASCULAR CATHETERIZATION Right 11/26/2015   Procedure: Peripheral Vascular Balloon Angioplasty;  Surgeon: Angelia Mould, MD;  Location: [place] CV LAB;  Service: Cardiovascular;  Laterality: Right;  rt common iliac   PERIPHERAL VASCULAR INTERVENTION  07/16/2018   Procedure: PERIPHERAL VASCULAR INTERVENTION;  Surgeon: Angelia Mould, MD;  Location: [place] CV LAB;  Service: Cardiovascular;;   PERIPHERAL VASCULAR INTERVENTION Right 10/05/2020   Procedure: PERIPHERAL VASCULAR INTERVENTION;  Surgeon: Angelia Mould, MD;  Location: [place] CV LAB;  Service: Cardiovascular;  Laterality: Right;  Common and exteral iliac artery   TEE WITHOUT CARDIOVERSION N/A 03/26/2021   Procedure: TRANSESOPHAGEAL ECHOCARDIOGRAM (TEE);  Surgeon: Donato Heinz, MD;  Location: [facility] ENDOSCOPY;  Service: Cardiovascular;  Laterality: N/A;   TOTAL ABDOMINAL HYSTERECTOMY      There were no vitals filed for this visit.  Subjective Assessment - 04/03/21 1030     Subjective Pt arrives for today's treatment session denying any pain.  Pt states that she has increased fatigue today due to having a doctor's appointment earlier this morning.    Pertinent History history of CVA    Limitations Lifting    How long can you stand comfortably? unable to stand without  support    Patient Stated Goals walk independently and drive    Currently in Pain? No/denies                               [facility] Adult PT Treatment/Exercise - 04/03/21 0001       Exercises   Exercises Shoulder;Knee/Hip      Knee/Hip Exercises: Aerobic   Nustep Lvl 3 x 15 mins      Knee/Hip Exercises: Seated   Long Arc Quad Strengthening;Both;20 reps;Weights    Long Arc Quad Weight 2 lbs.    Ball Squeeze 2 mins    Clamshell with TheraBand Red   2 mins   Marching Strengthening;Both;20 reps;Weights    Marching Limitations 2    Hamstring Curl Strengthening;Both;20 reps    Hamstring Limitations red tband    Sit to [employer] 10 reps   intermittent UE support     Shoulder Exercises: Seated   Extension Strengthening;Both;20 reps;Theraband    Theraband Level (Shoulder Extension) Level 1 (Yellow)    Row Strengthening;Both;20 reps;Theraband    Theraband Level (Shoulder Row) Level 1 (Yellow)    Horizontal ABduction Strengthening;Both;20 reps;Theraband    Theraband Level (Shoulder Horizontal ABduction) Level 1 (Yellow)    External Rotation Strengthening;Both;20 reps;Theraband   Cues to keep elbows at side   Theraband Level (Shoulder External Rotation) Level 1 (Yellow)    Internal Rotation Strengthening;Left;20 reps;Theraband    Theraband Level (Shoulder Internal Rotation) Level 1 (Yellow)    Flexion Strengthening;Left;20 reps;Theraband    Theraband Level (Shoulder Flexion) Level 1 (Yellow)    Abduction Strengthening;Left;20 reps;Theraband    Theraband Level (Shoulder ABduction) Level 1 (Yellow)                          PT Long Term Goals - 04/01/21 1124       PT LONG TERM GOAL #1   Title Patient will be independent with her HEP.    Time 6    Period Weeks    Status New    Target Date 05/13/21      PT LONG TERM GOAL #2   Title Patient will be able to safely ambulate at least 80 feet with a cane or least restrictive device for improved household  mobility.    Time 6    Period Weeks    Status New    Target Date 05/13/21      PT LONG TERM GOAL #3   Title Patient will improve her grip strength of her left hand to at least 30 pounds for improved functional use of her LUE.    Time 6    Period Weeks    Status New    Target Date 05/13/21      PT LONG TERM GOAL #4   Title Patient will improve her 5x sit to stand time to 25 seconds or less for improved safety with transfers.    Time 6    Period Weeks    Status New    Target Date 05/13/21  Plan - 04/03/21 1034     Clinical Impression Statement Pt arrives for today's treatment session denying any pain, but endorses increased fatigue due to having another appointment earlier this morning.  Pt instructed in numerous seated UE and LE resisted strengthening exercises to increase strength and function.  Pt given rest breaks as needed due to increased fatigue. Pt denied any pain at completion of today's treatment session.    Personal Factors and Comorbidities Transportation;Comorbidity 3+    Comorbidities Multiple CVA's, PAD, DM, smoker,    Examination-Activity Limitations Locomotion Level;Transfers;Bathing;Caring for Others;Carry;Hygiene/Grooming    Examination-Participation Restrictions Meal Prep;Cleaning;Community Activity;Shop;Driving;Valla Leaver [facility]    Stability/Clinical Decision Making Evolving/Moderate complexity    Rehab Potential Good    PT Frequency 3x / week   2-3x / week   PT Duration 6 weeks    PT Treatment/Interventions ADLs/Self Care Home Management;Electrical Stimulation;Gait training;Stair training;Functional mobility training;Therapeutic activities;Therapeutic exercise;Balance training;Neuromuscular re-education;Patient/family education;Energy conservation;Taping    PT Next Visit Plan monitor BP: nustep (upper and lower extremities), upper and lower extremity strengthening, gait training    Consulted and Agree with Plan of Care Patient              Patient will benefit from skilled therapeutic intervention in order to improve the following deficits and impairments:  Abnormal gait, Difficulty walking, Impaired tone, Impaired UE functional use, Decreased activity tolerance, Decreased balance, Impaired sensation, Decreased strength, Decreased mobility  Visit Diagnosis: Muscle weakness (generalized)  Difficulty in walking, not elsewhere classified     Problem List Patient Active Problem List   Diagnosis Date Noted   CVA (cerebral vascular accident) ([place]) 03/26/2021   Acute CVA (cerebrovascular accident) ([place]) 03/25/2021   Depression with anxiety 03/25/2021   Stroke determined by clinical assessment ([place]) 03/21/2021   Nausea in adult 02/01/2021   Upper respiratory tract infection 02/01/2021   Acute non-recurrent maxillary sinusitis 02/01/2021   Seasonal allergies 04/11/2020   Smoker 05/27/2019   Hyperlipidemia    Vertigo    Migraine without aura and with status migrainosus, not intractable 04/29/2016   DM type 2 with diabetic dyslipidemia ([place]) 01/24/2016   Anxiety, generalized 07/24/2015   Major depressive disorder, recurrent episode, moderate ([place]) 05/30/2013   PAD (peripheral artery disease) ([place]) 02/09/2013   Atherosclerosis of native artery of extremity with intermittent claudication ([place]) 01/05/2013    Kathrynn Ducking, PTA 04/03/2021, 11:20 AM  [facility] [address] [place], [place], [address] Phone: [phone]   Fax:  [phone]  Name: EYANNA MCGONAGLE MRN: [id number] Date of Birth: 13-Sep-1965

## 2021-04-04 ENCOUNTER — Other Ambulatory Visit: Payer: Self-pay | Admitting: Family Medicine

## 2021-04-04 DIAGNOSIS — G2581 Restless legs syndrome: Secondary | ICD-10-CM

## 2021-04-04 LAB — ANEMIA PROFILE B
Basophils Absolute: 0.1 10*3/uL (ref 0.0–0.2)
Basos: 1 %
EOS (ABSOLUTE): 0.1 10*3/uL (ref 0.0–0.4)
Eos: 1 %
Ferritin: 426 ng/mL — ABNORMAL HIGH (ref 15–150)
Folate: 7.7 ng/mL (ref >3.0)
Hematocrit: 41.3 % (ref 34.0–46.6)
Hemoglobin: 14.7 g/dL (ref 11.1–15.9)
Immature Grans (Abs): 0 10*3/uL (ref 0.0–0.1)
Immature Granulocytes: 0 %
Iron Saturation: 30 % (ref 15–55)
Iron: 84 ug/dL (ref 27–159)
Lymphocytes Absolute: 2.9 10*3/uL (ref 0.7–3.1)
Lymphs: 27 %
MCH: 31.1 pg (ref 26.6–33.0)
MCHC: 35.6 g/dL (ref 31.5–35.7)
MCV: 87 fL (ref 79–97)
Monocytes Absolute: 0.5 10*3/uL (ref 0.1–0.9)
Monocytes: 4 %
Neutrophils Absolute: 6.9 10*3/uL (ref 1.4–7.0)
Neutrophils: 67 %
Platelets: 349 10*3/uL (ref 150–450)
RBC: 4.73 x10E6/uL (ref 3.77–5.28)
RDW: 11.7 % (ref 11.7–15.4)
Retic Ct Pct: 1.3 % (ref 0.6–2.6)
Total Iron Binding Capacity: 280 ug/dL (ref 250–450)
UIBC: 196 ug/dL (ref 131–425)
Vitamin B-12: 348 pg/mL (ref 232–1245)
WBC: 10.4 10*3/uL (ref 3.4–10.8)

## 2021-04-04 LAB — MAGNESIUM: Magnesium: 1.8 mg/dL (ref 1.6–2.3)

## 2021-04-04 LAB — TSH: TSH: 1.13 u[IU]/mL (ref 0.450–4.500)

## 2021-04-04 LAB — CMP14+EGFR
ALT: 10 IU/L (ref 0–32)
AST: 15 IU/L (ref 0–40)
Albumin/Globulin Ratio: 2 (ref 1.2–2.2)
Albumin: 4.7 g/dL (ref 3.8–4.9)
Alkaline Phosphatase: 121 IU/L (ref 44–121)
BUN/Creatinine Ratio: 17 (ref 9–23)
BUN: 14 mg/dL (ref 6–24)
Bilirubin Total: 0.5 mg/dL (ref 0.0–1.2)
CO2: 20 mmol/L (ref 20–29)
Calcium: 10 mg/dL (ref 8.7–10.2)
Chloride: 103 mmol/L (ref 96–106)
Creatinine, Ser: 0.84 mg/dL (ref 0.57–1.00)
Globulin, Total: 2.4 g/dL (ref 1.5–4.5)
Glucose: 132 mg/dL — ABNORMAL HIGH (ref 70–99)
Potassium: 4.2 mmol/L (ref 3.5–5.2)
Sodium: 139 mmol/L (ref 134–144)
Total Protein: 7.1 g/dL (ref 6.0–8.5)
eGFR: 82 mL/min/{1.73_m2} (ref >59)

## 2021-04-04 MED ORDER — ROPINIROLE HCL 0.25 MG PO TABS: 0.2500 mg | ORAL_TABLET | Freq: Every day | ORAL | 1 refills | Status: DC

## 2021-04-05 ENCOUNTER — Other Ambulatory Visit: Payer: Self-pay

## 2021-04-05 ENCOUNTER — Ambulatory Visit: Payer: 59

## 2021-04-05 DIAGNOSIS — M6281 Muscle weakness (generalized): Secondary | ICD-10-CM

## 2021-04-05 DIAGNOSIS — R262 Difficulty in walking, not elsewhere classified: Secondary | ICD-10-CM | POA: Diagnosis not present

## 2021-04-05 NOTE — Therapy (Signed)
[facility] [place], [place], [address] Phone: [phone]   Fax:  [phone]  Physical Therapy Treatment  Patient Details  Name: Hannah Mcdaniel MRN: [id number] Date of Birth: 07/14/1965 Referring Provider (PT): Thereasa Solo, MD   Encounter Date: 04/05/2021   PT End of Session - 04/05/21 1033     Visit Number 3    Number of Visits 16    Date for PT Re-Evaluation 06/07/21    PT Start Time 1030    PT Stop Time 1115    PT Time Calculation (min) 45 min    Activity Tolerance Patient tolerated treatment well    Behavior During Therapy [facility] for tasks assessed/performed             Past Medical History:  Diagnosis Date   Anxiety    Arterial occlusive disease Nov. 2014   Arthritis    Claudication of lower extremity ([place]) Nov. 2014   Right Lower Extremity rest pain   Colon polyps    Depression    Diabetes mellitus without complication ([place])    GERD (gastroesophageal reflux disease)    Hyperlipidemia    Migraines    Vertigo     Past Surgical History:  Procedure Laterality Date   ABDOMINAL AORTAGRAM N/A 01/10/2013   Procedure: ABDOMINAL AORTAGRAM;  Surgeon: Rosetta Posner, MD;  Location: [facility] CATH LAB;  Service: Cardiovascular;  Laterality: N/A;   ABDOMINAL AORTOGRAM W/LOWER EXTREMITY Bilateral 07/16/2018   Procedure: ABDOMINAL AORTOGRAM W/LOWER EXTREMITY;  Surgeon: Angelia Mould, MD;  Location: [place] CV LAB;  Service: Cardiovascular;  Laterality: Bilateral;   ABDOMINAL AORTOGRAM W/LOWER EXTREMITY N/A 10/05/2020   Procedure: ABDOMINAL AORTOGRAM W/LOWER EXTREMITY;  Surgeon: Angelia Mould, MD;  Location: [place] CV LAB;  Service: Cardiovascular;  Laterality: N/A;   BUBBLE STUDY  03/26/2021   Procedure: BUBBLE STUDY;  Surgeon: Donato Heinz, MD;  Location: [facility] ENDOSCOPY;  Service: Cardiovascular;;   CHOLECYSTECTOMY     Gall Bladder   iliac artery angioplasty and stent placement  01/10/13    LOWER EXTREMITY ANGIOGRAM Bilateral 01/10/2013   Procedure: LOWER EXTREMITY ANGIOGRAM;  Surgeon: Rosetta Posner, MD;  Location: [facility] CATH LAB;  Service: Cardiovascular;  Laterality: Bilateral;   PERCUTANEOUS STENT INTERVENTION Right 01/10/2013   Procedure: PERCUTANEOUS STENT INTERVENTION;  Surgeon: Rosetta Posner, MD;  Location: [facility] CATH LAB;  Service: Cardiovascular;  Laterality: Right;  rt common iliac stent   PERIPHERAL VASCULAR CATHETERIZATION N/A 11/26/2015   Procedure: Abdominal Aortogram w/Lower Extremity;  Surgeon: Angelia Mould, MD;  Location: [place] CV LAB;  Service: Cardiovascular;  Laterality: N/A;   PERIPHERAL VASCULAR CATHETERIZATION Right 11/26/2015   Procedure: Peripheral Vascular Balloon Angioplasty;  Surgeon: Angelia Mould, MD;  Location: [place] CV LAB;  Service: Cardiovascular;  Laterality: Right;  rt common iliac   PERIPHERAL VASCULAR INTERVENTION  07/16/2018   Procedure: PERIPHERAL VASCULAR INTERVENTION;  Surgeon: Angelia Mould, MD;  Location: [place] CV LAB;  Service: Cardiovascular;;   PERIPHERAL VASCULAR INTERVENTION Right 10/05/2020   Procedure: PERIPHERAL VASCULAR INTERVENTION;  Surgeon: Angelia Mould, MD;  Location: [place] CV LAB;  Service: Cardiovascular;  Laterality: Right;  Common and exteral iliac artery   TEE WITHOUT CARDIOVERSION N/A 03/26/2021   Procedure: TRANSESOPHAGEAL ECHOCARDIOGRAM (TEE);  Surgeon: Donato Heinz, MD;  Location: [facility] ENDOSCOPY;  Service: Cardiovascular;  Laterality: N/A;   TOTAL ABDOMINAL HYSTERECTOMY      There were no vitals filed for this visit.  Subjective Assessment - 04/05/21 1032     Subjective Patient reports that she feels alright today.    Pertinent History history of CVA    Limitations Lifting    How long can you stand comfortably? unable to stand without support    Patient Stated Goals walk independently and drive    Currently in Pain? No/denies                                [facility] Adult PT Treatment/Exercise - 04/05/21 0001       Elbow Exercises   Elbow Flexion Both;Seated;20 reps   3 pounds     Knee/Hip Exercises: Aerobic   Nustep L4-5 x 15 minutes      Knee/Hip Exercises: Standing   Heel Raises Both;2 sets;10 reps    Hip Flexion Both;20 reps;Knee bent      Knee/Hip Exercises: Seated   Long Arc Quad Both;20 reps;Weights    Long Arc Quad Weight 2 lbs.    Hamstring Curl Both;2 sets;10 reps    Hamstring Limitations red tband      Knee/Hip Exercises: Supine   Straight Leg Raises Both;1 set;10 reps      Shoulder Exercises: Seated   Row Strengthening;Both;20 reps;Theraband    Theraband Level (Shoulder Row) Level 2 (Red)    Other Seated Exercises Therabar bending   up and down; 20 reps each; green t-bar   Other Seated Exercises Therabar twisting   20 reps each; green t-bar                         PT Long Term Goals - 04/01/21 1124       PT LONG TERM GOAL #1   Title Patient will be independent with her HEP.    Time 6    Period Weeks    Status New    Target Date 05/13/21      PT LONG TERM GOAL #2   Title Patient will be able to safely ambulate at least 80 feet with a cane or least restrictive device for improved household mobility.    Time 6    Period Weeks    Status New    Target Date 05/13/21      PT LONG TERM GOAL #3   Title Patient will improve her grip strength of her left hand to at least 30 pounds for improved functional use of her LUE.    Time 6    Period Weeks    Status New    Target Date 05/13/21      PT LONG TERM GOAL #4   Title Patient will improve her 5x sit to stand time to 25 seconds or less for improved safety with transfers.    Time 6    Period Weeks    Status New    Target Date 05/13/21                   Plan - 04/05/21 1034     Clinical Impression Statement Patient was introduced to multiple new interventions for improved upper and lower extremity  strength. She required moderate cuing with today's interventions for improved eccentric control to maintain muscular stability. She also required external stability from the therapist for marching to limit left knee hyperextension to facilitate quadriceps control. Upper and lower extremity interventions were alternaed to avoid a significant increase in fatigue. She reported feeling good upon the  conclusion of treatment. She continues to require skilled physical therapy to address her remaining impairments to maximize her functional mobility.    Personal Factors and Comorbidities Transportation;Comorbidity 3+    Comorbidities Multiple CVA's, PAD, DM, smoker,    Examination-Activity Limitations Locomotion Level;Transfers;Bathing;Caring for Others;Carry;Hygiene/Grooming    Examination-Participation Restrictions Meal Prep;Cleaning;Community Activity;Shop;Driving;Valla Leaver [facility]    Stability/Clinical Decision Making Evolving/Moderate complexity    Rehab Potential Good    PT Frequency 3x / week   2-3x / week   PT Duration 6 weeks    PT Treatment/Interventions ADLs/Self Care Home Management;Electrical Stimulation;Gait training;Stair training;Functional mobility training;Therapeutic activities;Therapeutic exercise;Balance training;Neuromuscular re-education;Patient/family education;Energy conservation;Taping    PT Next Visit Plan monitor BP: nustep (upper and lower extremities), upper and lower extremity strengthening, gait training    Consulted and Agree with Plan of Care Patient             Patient will benefit from skilled therapeutic intervention in order to improve the following deficits and impairments:  Abnormal gait, Difficulty walking, Impaired tone, Impaired UE functional use, Decreased activity tolerance, Decreased balance, Impaired sensation, Decreased strength, Decreased mobility  Visit Diagnosis: Muscle weakness (generalized)  Difficulty in walking, not elsewhere  classified     Problem List Patient Active Problem List   Diagnosis Date Noted   CVA (cerebral vascular accident) ([place]) 03/26/2021   Acute CVA (cerebrovascular accident) ([place]) 03/25/2021   Depression with anxiety 03/25/2021   Stroke determined by clinical assessment ([place]) 03/21/2021   Nausea in adult 02/01/2021   Upper respiratory tract infection 02/01/2021   Acute non-recurrent maxillary sinusitis 02/01/2021   Seasonal allergies 04/11/2020   Smoker 05/27/2019   Hyperlipidemia    Vertigo    Migraine without aura and with status migrainosus, not intractable 04/29/2016   DM type 2 with diabetic dyslipidemia ([place]) 01/24/2016   Anxiety, generalized 07/24/2015   Major depressive disorder, recurrent episode, moderate ([place]) 05/30/2013   PAD (peripheral artery disease) ([place]) 02/09/2013   Atherosclerosis of native artery of extremity with intermittent claudication ([place]) 01/05/2013    Darlin Coco, PT 04/05/2021, 12:14 PM  [facility] [place], [place], [address] Phone: [phone]   Fax:  [phone]  Name: Hannah Mcdaniel MRN: [id number] Date of Birth: Jun 19, 1965

## 2021-04-07 ENCOUNTER — Encounter: Payer: Self-pay | Admitting: Family Medicine

## 2021-04-08 ENCOUNTER — Ambulatory Visit: Payer: 59

## 2021-04-08 ENCOUNTER — Other Ambulatory Visit: Payer: Self-pay

## 2021-04-08 DIAGNOSIS — R262 Difficulty in walking, not elsewhere classified: Secondary | ICD-10-CM | POA: Diagnosis not present

## 2021-04-08 DIAGNOSIS — M6281 Muscle weakness (generalized): Secondary | ICD-10-CM | POA: Diagnosis not present

## 2021-04-08 NOTE — Telephone Encounter (Signed)
[facility] to [place]  ? ?[facility] never has [facility] ? ?I attempted PA and says no eligibility found - ([employer] plan)  ? ?LM for pt to call me back to confirm current insurance carrier, BIN # and PCN# [email or web address] ?

## 2021-04-08 NOTE — Therapy (Signed)
[facility] [place], [place], [address] Phone: [phone]   Fax:  [phone]  Physical Therapy Treatment  Patient Details  Name: Hannah Mcdaniel MRN: [id number] Date of Birth: Mar 03, 1965 Referring Provider (PT): Thereasa Solo, MD   Encounter Date: 04/08/2021   PT End of Session - 04/08/21 1036     Visit Number 4    Number of Visits 16    Date for PT Re-Evaluation 06/07/21    PT Start Time 1030    PT Stop Time 1116    PT Time Calculation (min) 46 min    Activity Tolerance Patient tolerated treatment well    Behavior During Therapy [facility] for tasks assessed/performed             Past Medical History:  Diagnosis Date   Anxiety    Arterial occlusive disease Nov. 2014   Arthritis    Claudication of lower extremity ([place]) Nov. 2014   Right Lower Extremity rest pain   Colon polyps    Depression    Diabetes mellitus without complication ([place])    GERD (gastroesophageal reflux disease)    Hyperlipidemia    Migraines    Vertigo     Past Surgical History:  Procedure Laterality Date   ABDOMINAL AORTAGRAM N/A 01/10/2013   Procedure: ABDOMINAL AORTAGRAM;  Surgeon: Rosetta Posner, MD;  Location: [facility] CATH LAB;  Service: Cardiovascular;  Laterality: N/A;   ABDOMINAL AORTOGRAM W/LOWER EXTREMITY Bilateral 07/16/2018   Procedure: ABDOMINAL AORTOGRAM W/LOWER EXTREMITY;  Surgeon: Angelia Mould, MD;  Location: [place] CV LAB;  Service: Cardiovascular;  Laterality: Bilateral;   ABDOMINAL AORTOGRAM W/LOWER EXTREMITY N/A 10/05/2020   Procedure: ABDOMINAL AORTOGRAM W/LOWER EXTREMITY;  Surgeon: Angelia Mould, MD;  Location: [place] CV LAB;  Service: Cardiovascular;  Laterality: N/A;   BUBBLE STUDY  03/26/2021   Procedure: BUBBLE STUDY;  Surgeon: Donato Heinz, MD;  Location: [facility] ENDOSCOPY;  Service: Cardiovascular;;   CHOLECYSTECTOMY     Gall Bladder   iliac artery angioplasty and stent placement  01/10/13    LOWER EXTREMITY ANGIOGRAM Bilateral 01/10/2013   Procedure: LOWER EXTREMITY ANGIOGRAM;  Surgeon: Rosetta Posner, MD;  Location: [facility] CATH LAB;  Service: Cardiovascular;  Laterality: Bilateral;   PERCUTANEOUS STENT INTERVENTION Right 01/10/2013   Procedure: PERCUTANEOUS STENT INTERVENTION;  Surgeon: Rosetta Posner, MD;  Location: [facility] CATH LAB;  Service: Cardiovascular;  Laterality: Right;  rt common iliac stent   PERIPHERAL VASCULAR CATHETERIZATION N/A 11/26/2015   Procedure: Abdominal Aortogram w/Lower Extremity;  Surgeon: Angelia Mould, MD;  Location: [place] CV LAB;  Service: Cardiovascular;  Laterality: N/A;   PERIPHERAL VASCULAR CATHETERIZATION Right 11/26/2015   Procedure: Peripheral Vascular Balloon Angioplasty;  Surgeon: Angelia Mould, MD;  Location: [place] CV LAB;  Service: Cardiovascular;  Laterality: Right;  rt common iliac   PERIPHERAL VASCULAR INTERVENTION  07/16/2018   Procedure: PERIPHERAL VASCULAR INTERVENTION;  Surgeon: Angelia Mould, MD;  Location: [place] CV LAB;  Service: Cardiovascular;;   PERIPHERAL VASCULAR INTERVENTION Right 10/05/2020   Procedure: PERIPHERAL VASCULAR INTERVENTION;  Surgeon: Angelia Mould, MD;  Location: [place] CV LAB;  Service: Cardiovascular;  Laterality: Right;  Common and exteral iliac artery   TEE WITHOUT CARDIOVERSION N/A 03/26/2021   Procedure: TRANSESOPHAGEAL ECHOCARDIOGRAM (TEE);  Surgeon: Donato Heinz, MD;  Location: [facility] ENDOSCOPY;  Service: Cardiovascular;  Laterality: N/A;   TOTAL ABDOMINAL HYSTERECTOMY      There were no vitals filed for this visit.  Subjective Assessment - 04/08/21 1035     Subjective Pt denies any pain today.  States that she was able to walk some with her cane over the weekend without issue.    Pertinent History history of CVA    Limitations Lifting    How long can you stand comfortably? unable to stand without support    Patient Stated Goals walk independently and drive     Currently in Pain? No/denies                               [facility] Adult PT Treatment/Exercise - 04/08/21 0001       Elbow Exercises   Elbow Flexion Both;Seated;20 reps   3 pounds     Knee/Hip Exercises: Aerobic   Nustep L4-6 x 15 mins      Knee/Hip Exercises: Standing   Heel Raises Both;2 sets;10 reps    Hip Flexion Both;20 reps;Knee bent    Hip ADduction Strengthening;Both;20 reps    Hip Extension Stengthening;Both;20 reps;Knee straight      Knee/Hip Exercises: Seated   Long Arc Quad Both;20 reps;Weights;5 sets    Long Arc Quad Weight 2 lbs.    Clamshell with TheraBand Red   2 mins   Hamstring Curl Both;2 sets;10 reps    Hamstring Limitations red tband    Sit to Sand 10 reps;2 sets;with UE support      Shoulder Exercises: Seated   Extension Strengthening;Both;20 reps;Theraband;5 reps    Theraband Level (Shoulder Extension) Level 2 (Red)    Row Strengthening;Both;20 reps;Theraband;5 reps    Theraband Level (Shoulder Row) Level 2 (Red)    Other Seated Exercises Therabar bending and twisting   up and down; 20 reps each; green bar   Other Seated Exercises Overhead press; 3# 2 sets of 10 reps                 Balance Exercises - 04/08/21 0001       Balance Exercises: Standing   Other Standing Exercises Ball toss standing in front of chair; 2# weighted ball                     PT Long Term Goals - 04/01/21 1124       PT LONG TERM GOAL #1   Title Patient will be independent with her HEP.    Time 6    Period Weeks    Status New    Target Date 05/13/21      PT LONG TERM GOAL #2   Title Patient will be able to safely ambulate at least 80 feet with a cane or least restrictive device for improved household mobility.    Time 6    Period Weeks    Status New    Target Date 05/13/21      PT LONG TERM GOAL #3   Title Patient will improve her grip strength of her left hand to at least 30 pounds for improved functional use of her  LUE.    Time 6    Period Weeks    Status New    Target Date 05/13/21      PT LONG TERM GOAL #4   Title Patient will improve her 5x sit to stand time to 25 seconds or less for improved safety with transfers.    Time 6    Period Weeks    Status New    Target Date 05/13/21  Plan - 04/08/21 1036     Clinical Impression Statement Pt arrives for today's treatment session denying any pain.  Pt states that she can see an improvement in her LUE and LLE strength since beginning therapy.  Pt able to tolerate addition of standing hip abduction and hip extension with seated rest break given as needed.  Pt able to tolerate increased reps with seated UE and LE exercises with report of slight increase in fatigue.  Pt able to increase STS reps to 2 sets of 10 without use of UE support today.  Pt instructed to bring her cane to next session in order to attempt gait with her personal cane. Pt denied any pain at completion of today's treatment session with slight increase in fatigue.    Personal Factors and Comorbidities Transportation;Comorbidity 3+    Comorbidities Multiple CVA's, PAD, DM, smoker,    Examination-Activity Limitations Locomotion Level;Transfers;Bathing;Caring for Others;Carry;Hygiene/Grooming    Examination-Participation Restrictions Meal Prep;Cleaning;Community Activity;Shop;Driving;[facility]alla Leaver [facility]    Stability/Clinical Decision Making Evolving/Moderate complexity    Rehab Potential Good    PT Frequency 3x / week   2-3x / week   PT Duration 6 weeks    PT Treatment/Interventions ADLs/Self Care Home Management;Electrical Stimulation;Gait training;Stair training;Functional mobility training;Therapeutic activities;Therapeutic exercise;Balance training;Neuromuscular re-education;Patient/family education;Energy conservation;Taping    PT Next Visit Plan monitor BP: nustep (upper and lower extremities), upper and lower extremity strengthening, gait training    Consulted  and Agree with Plan of Care Patient             Patient will benefit from skilled therapeutic intervention in order to improve the following deficits and impairments:  Abnormal gait, Difficulty walking, Impaired tone, Impaired UE functional use, Decreased activity tolerance, Decreased balance, Impaired sensation, Decreased strength, Decreased mobility  Visit Diagnosis: Muscle weakness (generalized)  Difficulty in walking, not elsewhere classified     Problem List Patient Active Problem List   Diagnosis Date Noted   CVA (cerebral vascular accident) ([place]) 03/26/2021   Acute CVA (cerebrovascular accident) ([place]) 03/25/2021   Depression with anxiety 03/25/2021   Stroke determined by clinical assessment ([place]) 03/21/2021   Nausea in adult 02/01/2021   Upper respiratory tract infection 02/01/2021   Acute non-recurrent maxillary sinusitis 02/01/2021   Seasonal allergies 04/11/2020   Smoker 05/27/2019   Hyperlipidemia    Vertigo    Migraine without aura and with status migrainosus, not intractable 04/29/2016   DM type 2 with diabetic dyslipidemia ([place]) 01/24/2016   Anxiety, generalized 07/24/2015   Major depressive disorder, recurrent episode, moderate ([place]) 05/30/2013   PAD (peripheral artery disease) ([place]) 02/09/2013   Atherosclerosis of native artery of extremity with intermittent claudication ([place]) 01/05/2013    Kathrynn Ducking, PTA 04/08/2021, 11:21 AM  [facility] [place], [place], [address] Phone: [phone]   Fax:  [phone]  Name: Hannah Mcdaniel MRN: [id number] Date of Birth: [birth year]

## 2021-04-09 NOTE — Progress Notes (Signed)
Patient returning call about labs. Please call back  ?

## 2021-04-09 NOTE — Telephone Encounter (Signed)
I spoke with pt - she has no preference between repatha and praulent - she never was able to get the repatha earlier this year bc of a insurance change - PA needs to be started on one or the other ([facility] states that Praulent is most likely going to be the preference with Ins)  ?

## 2021-04-10 ENCOUNTER — Ambulatory Visit: Payer: 59

## 2021-04-10 ENCOUNTER — Other Ambulatory Visit: Payer: Self-pay

## 2021-04-10 DIAGNOSIS — R531 Weakness: Secondary | ICD-10-CM | POA: Diagnosis not present

## 2021-04-10 DIAGNOSIS — M6281 Muscle weakness (generalized): Secondary | ICD-10-CM

## 2021-04-10 DIAGNOSIS — R29898 Other symptoms and signs involving the musculoskeletal system: Secondary | ICD-10-CM | POA: Diagnosis not present

## 2021-04-10 DIAGNOSIS — I639 Cerebral infarction, unspecified: Secondary | ICD-10-CM | POA: Diagnosis not present

## 2021-04-10 DIAGNOSIS — R262 Difficulty in walking, not elsewhere classified: Secondary | ICD-10-CM

## 2021-04-10 NOTE — Therapy (Signed)
[facility] [place], [place], [address] Phone: [phone]   Fax:  [phone]  Physical Therapy Treatment  Patient Details  Name: Hannah Mcdaniel MRN: [id number] Date of Birth: 13-Jan-1966 Referring Provider (PT): Thereasa Solo, MD   Encounter Date: 04/10/2021   PT End of Session - 04/10/21 1035     Visit Number 5    Number of Visits 16    Date for PT Re-Evaluation 06/07/21    PT Start Time 1030    PT Stop Time 1113    PT Time Calculation (min) 43 min    Activity Tolerance Patient tolerated treatment well    Behavior During Therapy [facility] for tasks assessed/performed             Past Medical History:  Diagnosis Date   Anxiety    Arterial occlusive disease Nov. 2014   Arthritis    Claudication of lower extremity ([place]) Nov. 2014   Right Lower Extremity rest pain   Colon polyps    Depression    Diabetes mellitus without complication ([place])    GERD (gastroesophageal reflux disease)    Hyperlipidemia    Migraines    Vertigo     Past Surgical History:  Procedure Laterality Date   ABDOMINAL AORTAGRAM N/A 01/10/2013   Procedure: ABDOMINAL AORTAGRAM;  Surgeon: Rosetta Posner, MD;  Location: [facility] CATH LAB;  Service: Cardiovascular;  Laterality: N/A;   ABDOMINAL AORTOGRAM W/LOWER EXTREMITY Bilateral 07/16/2018   Procedure: ABDOMINAL AORTOGRAM W/LOWER EXTREMITY;  Surgeon: Angelia Mould, MD;  Location: [place] CV LAB;  Service: Cardiovascular;  Laterality: Bilateral;   ABDOMINAL AORTOGRAM W/LOWER EXTREMITY N/A 10/05/2020   Procedure: ABDOMINAL AORTOGRAM W/LOWER EXTREMITY;  Surgeon: Angelia Mould, MD;  Location: [place] CV LAB;  Service: Cardiovascular;  Laterality: N/A;   BUBBLE STUDY  03/26/2021   Procedure: BUBBLE STUDY;  Surgeon: Donato Heinz, MD;  Location: [facility] ENDOSCOPY;  Service: Cardiovascular;;   CHOLECYSTECTOMY     Gall Bladder   iliac artery angioplasty and stent placement  01/10/13    LOWER EXTREMITY ANGIOGRAM Bilateral 01/10/2013   Procedure: LOWER EXTREMITY ANGIOGRAM;  Surgeon: Rosetta Posner, MD;  Location: [facility] CATH LAB;  Service: Cardiovascular;  Laterality: Bilateral;   PERCUTANEOUS STENT INTERVENTION Right 01/10/2013   Procedure: PERCUTANEOUS STENT INTERVENTION;  Surgeon: Rosetta Posner, MD;  Location: [facility] CATH LAB;  Service: Cardiovascular;  Laterality: Right;  rt common iliac stent   PERIPHERAL VASCULAR CATHETERIZATION N/A 11/26/2015   Procedure: Abdominal Aortogram w/Lower Extremity;  Surgeon: Angelia Mould, MD;  Location: [place] CV LAB;  Service: Cardiovascular;  Laterality: N/A;   PERIPHERAL VASCULAR CATHETERIZATION Right 11/26/2015   Procedure: Peripheral Vascular Balloon Angioplasty;  Surgeon: Angelia Mould, MD;  Location: [place] CV LAB;  Service: Cardiovascular;  Laterality: Right;  rt common iliac   PERIPHERAL VASCULAR INTERVENTION  07/16/2018   Procedure: PERIPHERAL VASCULAR INTERVENTION;  Surgeon: Angelia Mould, MD;  Location: [place] CV LAB;  Service: Cardiovascular;;   PERIPHERAL VASCULAR INTERVENTION Right 10/05/2020   Procedure: PERIPHERAL VASCULAR INTERVENTION;  Surgeon: Angelia Mould, MD;  Location: [place] CV LAB;  Service: Cardiovascular;  Laterality: Right;  Common and exteral iliac artery   TEE WITHOUT CARDIOVERSION N/A 03/26/2021   Procedure: TRANSESOPHAGEAL ECHOCARDIOGRAM (TEE);  Surgeon: Donato Heinz, MD;  Location: [facility] ENDOSCOPY;  Service: Cardiovascular;  Laterality: N/A;   TOTAL ABDOMINAL HYSTERECTOMY      There were no vitals filed for this visit.  Subjective Assessment - 04/10/21 1033     Subjective Pt arrives for today's treatment session denying any pain.  Pt states she "over did it" yesterday with exercising and other activities while at home.  Pt brought her personal cane with her today.    Pertinent History history of CVA    Limitations Lifting    How long can you stand comfortably?  unable to stand without support    Patient Stated Goals walk independently and drive    Currently in Pain? No/denies                               [facility] Adult PT Treatment/Exercise - 04/10/21 0001       Exercises   Exercises Ankle;Knee/Hip;Shoulder      Knee/Hip Exercises: Aerobic   Nustep L4-L5 x 15 mins      Knee/Hip Exercises: Standing   Gait Training 66 ft x 2 with SPC   rest break in between laps, increased left foot drap during second lap     Knee/Hip Exercises: Seated   Sit to Sand 10 reps;2 sets;with UE support      Ankle Exercises: Seated   Other Seated Ankle Exercises Dorsiflexion, plantarflexion, inversion, eversion x 15 reps, yellow tband    Other Seated Ankle Exercises Rockerboard forward/backward, left/right x 2 mins each                 Balance Exercises - 04/10/21 0001       Balance Exercises: Standing   Standing Eyes Opened Foam/compliant surface;2 reps;30 secs    Tandem Stance Foam/compliant surface;4 reps;Intermittent upper extremity support;30 secs   single finger use                    PT Long Term Goals - 04/10/21 1035       PT LONG TERM GOAL #1   Title Patient will be independent with her HEP.    Time 6    Period Weeks    Status Achieved    Target Date 05/13/21      PT LONG TERM GOAL #2   Title Patient will be able to safely ambulate at least 80 feet with a cane or least restrictive device for improved household mobility.    Baseline 04/10/21: 66 ft x 2 with SPC, left foot slight drag    Time 6    Period Weeks    Status Partially Met    Target Date 05/13/21      PT LONG TERM GOAL #3   Title Patient will improve her grip strength of her left hand to at least 30 pounds for improved functional use of her LUE.    Baseline 04/10/21: 35 pounds    Time 6    Period Weeks    Status New    Target Date 05/13/21      PT LONG TERM GOAL #4   Title Patient will improve her 5x sit to stand time to 25 seconds or  less for improved safety with transfers.    Baseline 04/10/21: 18.97 seconds    Time 6    Period Weeks    Status Achieved    Target Date 05/13/21                   Plan - 04/10/21 1035     Clinical Impression Statement Pt arrives for today's treatment denying any pain, but endorses increased fatigue due  to "over doing it" yesterday at home with exercises and performing laundry.  Pt able to ambulate 66 ft x 2 with SPC with seated rest break in between.  Pt with increased left foot drag during second lap.  Pt able to perform 5 STS in 18.97 seconds today.  Pt make good progress towards all of her goals at this time.  Pt introduced to seated resisted ankle exercies to increase strength and decrease left foot drag during ambulation.  Pt challenged by addition of Airex pad with standing balance exercises.  Pt denied any pain at completion of today's treamtent session.    Personal Factors and Comorbidities Transportation;Comorbidity 3+    Comorbidities Multiple CVA's, PAD, DM, smoker,    Examination-Activity Limitations Locomotion Level;Transfers;Bathing;Caring for Others;Carry;Hygiene/Grooming    Examination-Participation Restrictions Meal Prep;Cleaning;Community Activity;Shop;Driving;[facility]    Stability/Clinical Decision Making Evolving/Moderate complexity    Rehab Potential Good    PT Frequency 3x / week   2-3x / week   PT Duration 6 weeks    PT Treatment/Interventions ADLs/Self Care Home Management;Electrical Stimulation;Gait training;Stair training;Functional mobility training;Therapeutic activities;Therapeutic exercise;Balance training;Neuromuscular re-education;Patient/family education;Energy conservation;Taping    PT Next Visit Plan monitor BP: nustep (upper and lower extremities), upper and lower extremity strengthening, gait training    Consulted and Agree with Plan of Care Patient             Patient will benefit from skilled therapeutic intervention in order to  improve the following deficits and impairments:  Abnormal gait, Difficulty walking, Impaired tone, Impaired UE functional use, Decreased activity tolerance, Decreased balance, Impaired sensation, Decreased strength, Decreased mobility  Visit Diagnosis: Muscle weakness (generalized)  Difficulty in walking, not elsewhere classified     Problem List Patient Active Problem List   Diagnosis Date Noted   CVA (cerebral vascular accident) ([place]) 03/26/2021   Acute CVA (cerebrovascular accident) ([place]) 03/25/2021   Depression with anxiety 03/25/2021   Stroke determined by clinical assessment ([place]) 03/21/2021   Nausea in adult 02/01/2021   Upper respiratory tract infection 02/01/2021   Acute non-recurrent maxillary sinusitis 02/01/2021   Seasonal allergies 04/11/2020   Smoker 05/27/2019   Hyperlipidemia    Vertigo    Migraine without aura and with status migrainosus, not intractable 04/29/2016   DM type 2 with diabetic dyslipidemia ([place]) 01/24/2016   Anxiety, generalized 07/24/2015   Major depressive disorder, recurrent episode, moderate ([place]) 05/30/2013   PAD (peripheral artery disease) ([place]) 02/09/2013   Atherosclerosis of native artery of extremity with intermittent claudication ([place]) 01/05/2013    Hannah Mcdaniel, PTA 04/10/2021, 11:31 AM  [facility] [place], [place], [address] Phone: [phone]   Fax:  [phone]  Name: Hannah Mcdaniel MRN: [id number] Date of Birth: 1965-06-02

## 2021-04-11 ENCOUNTER — Emergency Department (HOSPITAL_COMMUNITY): Payer: 59

## 2021-04-11 ENCOUNTER — Encounter (HOSPITAL_COMMUNITY): Payer: Self-pay | Admitting: Emergency Medicine

## 2021-04-11 ENCOUNTER — Inpatient Hospital Stay (HOSPITAL_COMMUNITY)
Admission: EM | Admit: 2021-04-11 | Discharge: 2021-04-13 | DRG: 065 | Disposition: A | Discharge status: Home / Self Care | Payer: 59 | Attending: Internal Medicine | Admitting: Internal Medicine

## 2021-04-11 ENCOUNTER — Other Ambulatory Visit: Payer: Self-pay

## 2021-04-11 DIAGNOSIS — Z818 Family history of other mental and behavioral disorders: Secondary | ICD-10-CM

## 2021-04-11 DIAGNOSIS — Z886 Allergy status to analgesic agent status: Secondary | ICD-10-CM

## 2021-04-11 DIAGNOSIS — H538 Other visual disturbances: Secondary | ICD-10-CM | POA: Diagnosis not present

## 2021-04-11 DIAGNOSIS — Z6825 Body mass index (BMI) 25.0-25.9, adult: Secondary | ICD-10-CM

## 2021-04-11 DIAGNOSIS — Z20822 Contact with and (suspected) exposure to covid-19: Secondary | ICD-10-CM | POA: Diagnosis present

## 2021-04-11 DIAGNOSIS — I739 Peripheral vascular disease, unspecified: Secondary | ICD-10-CM | POA: Diagnosis not present

## 2021-04-11 DIAGNOSIS — I152 Hypertension secondary to endocrine disorders: Secondary | ICD-10-CM

## 2021-04-11 DIAGNOSIS — E440 Moderate protein-calorie malnutrition: Secondary | ICD-10-CM | POA: Diagnosis present

## 2021-04-11 DIAGNOSIS — H547 Unspecified visual loss: Secondary | ICD-10-CM

## 2021-04-11 DIAGNOSIS — I6381 Other cerebral infarction due to occlusion or stenosis of small artery: Secondary | ICD-10-CM | POA: Diagnosis not present

## 2021-04-11 DIAGNOSIS — Z7902 Long term (current) use of antithrombotics/antiplatelets: Secondary | ICD-10-CM

## 2021-04-11 DIAGNOSIS — M1990 Unspecified osteoarthritis, unspecified site: Secondary | ICD-10-CM | POA: Diagnosis present

## 2021-04-11 DIAGNOSIS — E785 Hyperlipidemia, unspecified: Secondary | ICD-10-CM | POA: Diagnosis present

## 2021-04-11 DIAGNOSIS — Z9049 Acquired absence of other specified parts of digestive tract: Secondary | ICD-10-CM

## 2021-04-11 DIAGNOSIS — R29898 Other symptoms and signs involving the musculoskeletal system: Secondary | ICD-10-CM | POA: Diagnosis not present

## 2021-04-11 DIAGNOSIS — I639 Cerebral infarction, unspecified: Secondary | ICD-10-CM | POA: Diagnosis not present

## 2021-04-11 DIAGNOSIS — Z7984 Long term (current) use of oral hypoglycemic drugs: Secondary | ICD-10-CM

## 2021-04-11 DIAGNOSIS — I10 Essential (primary) hypertension: Secondary | ICD-10-CM | POA: Diagnosis not present

## 2021-04-11 DIAGNOSIS — Z8249 Family history of ischemic heart disease and other diseases of the circulatory system: Secondary | ICD-10-CM

## 2021-04-11 DIAGNOSIS — F32A Depression, unspecified: Secondary | ICD-10-CM | POA: Diagnosis present

## 2021-04-11 DIAGNOSIS — Z833 Family history of diabetes mellitus: Secondary | ICD-10-CM

## 2021-04-11 DIAGNOSIS — E1151 Type 2 diabetes mellitus with diabetic peripheral angiopathy without gangrene: Secondary | ICD-10-CM | POA: Diagnosis not present

## 2021-04-11 DIAGNOSIS — R202 Paresthesia of skin: Secondary | ICD-10-CM | POA: Diagnosis not present

## 2021-04-11 DIAGNOSIS — R200 Anesthesia of skin: Secondary | ICD-10-CM

## 2021-04-11 DIAGNOSIS — E1169 Type 2 diabetes mellitus with other specified complication: Secondary | ICD-10-CM | POA: Diagnosis not present

## 2021-04-11 DIAGNOSIS — F418 Other specified anxiety disorders: Secondary | ICD-10-CM | POA: Diagnosis present

## 2021-04-11 DIAGNOSIS — L531 Erythema annulare centrifugum: Secondary | ICD-10-CM | POA: Diagnosis not present

## 2021-04-11 DIAGNOSIS — Z880 Allergy status to penicillin: Secondary | ICD-10-CM

## 2021-04-11 DIAGNOSIS — Z90710 Acquired absence of both cervix and uterus: Secondary | ICD-10-CM

## 2021-04-11 DIAGNOSIS — Z7982 Long term (current) use of aspirin: Secondary | ICD-10-CM

## 2021-04-11 DIAGNOSIS — R29702 NIHSS score 2: Secondary | ICD-10-CM | POA: Diagnosis present

## 2021-04-11 DIAGNOSIS — Z885 Allergy status to narcotic agent status: Secondary | ICD-10-CM

## 2021-04-11 DIAGNOSIS — F419 Anxiety disorder, unspecified: Secondary | ICD-10-CM | POA: Diagnosis present

## 2021-04-11 DIAGNOSIS — I69392 Facial weakness following cerebral infarction: Secondary | ICD-10-CM

## 2021-04-11 DIAGNOSIS — I69344 Monoplegia of lower limb following cerebral infarction affecting left non-dominant side: Secondary | ICD-10-CM | POA: Diagnosis not present

## 2021-04-11 DIAGNOSIS — R531 Weakness: Secondary | ICD-10-CM | POA: Diagnosis not present

## 2021-04-11 DIAGNOSIS — Z87891 Personal history of nicotine dependence: Secondary | ICD-10-CM

## 2021-04-11 DIAGNOSIS — K219 Gastro-esophageal reflux disease without esophagitis: Secondary | ICD-10-CM | POA: Diagnosis present

## 2021-04-11 DIAGNOSIS — R69 Illness, unspecified: Secondary | ICD-10-CM | POA: Diagnosis not present

## 2021-04-11 DIAGNOSIS — Z79899 Other long term (current) drug therapy: Secondary | ICD-10-CM

## 2021-04-11 DIAGNOSIS — Z823 Family history of stroke: Secondary | ICD-10-CM

## 2021-04-11 LAB — CBC
HCT: 39.6 % (ref 36.0–46.0)
HCT: 35.6 % — ABNORMAL LOW (ref 36.0–46.0)
Hemoglobin: 14.1 g/dL (ref 12.0–15.0)
Hemoglobin: 12.7 g/dL (ref 12.0–15.0)
MCH: 31.5 pg (ref 26.0–34.0)
MCH: 31.3 pg (ref 26.0–34.0)
MCHC: 35.6 g/dL (ref 30.0–36.0)
MCHC: 35.7 g/dL (ref 30.0–36.0)
MCV: 88.6 fL (ref 80.0–100.0)
MCV: 87.7 fL (ref 80.0–100.0)
Platelets: 290 10*3/uL (ref 150–400)
Platelets: 281 10*3/uL (ref 150–400)
RBC: 4.47 MIL/uL (ref 3.87–5.11)
RBC: 4.06 MIL/uL (ref 3.87–5.11)
RDW: 11.8 % (ref 11.5–15.5)
RDW: 11.7 % (ref 11.5–15.5)
WBC: 12 10*3/uL — ABNORMAL HIGH (ref 4.0–10.5)
WBC: 10.9 10*3/uL — ABNORMAL HIGH (ref 4.0–10.5)
nRBC: 0 % (ref 0.0–0.2)
nRBC: 0 % (ref 0.0–0.2)

## 2021-04-11 LAB — DIFFERENTIAL
Abs Immature Granulocytes: 0.04 10*3/uL (ref 0.00–0.07)
Basophils Absolute: 0.1 10*3/uL (ref 0.0–0.1)
Basophils Relative: 1 %
Eosinophils Absolute: 0.1 10*3/uL (ref 0.0–0.5)
Eosinophils Relative: 1 %
Immature Granulocytes: 0 %
Lymphocytes Relative: 33 %
Lymphs Abs: 4 10*3/uL (ref 0.7–4.0)
Monocytes Absolute: 0.5 10*3/uL (ref 0.1–1.0)
Monocytes Relative: 4 %
Neutro Abs: 7.3 10*3/uL (ref 1.7–7.7)
Neutrophils Relative %: 61 %

## 2021-04-11 LAB — COMPREHENSIVE METABOLIC PANEL WITH GFR
ALT: 23 U/L (ref 0–44)
AST: 26 U/L (ref 15–41)
Albumin: 3.8 g/dL (ref 3.5–5.0)
Alkaline Phosphatase: 91 U/L (ref 38–126)
Anion gap: 7 (ref 5–15)
BUN: 12 mg/dL (ref 6–20)
CO2: 25 mmol/L (ref 22–32)
Calcium: 9.5 mg/dL (ref 8.9–10.3)
Chloride: 104 mmol/L (ref 98–111)
Creatinine, Ser: 0.97 mg/dL (ref 0.44–1.00)
GFR, Estimated: >60 mL/min
Glucose, Bld: 172 mg/dL — ABNORMAL HIGH (ref 70–99)
Potassium: 3.9 mmol/L (ref 3.5–5.1)
Sodium: 136 mmol/L (ref 135–145)
Total Bilirubin: 0.5 mg/dL (ref 0.3–1.2)
Total Protein: 6.8 g/dL (ref 6.5–8.1)

## 2021-04-11 LAB — I-STAT BETA HCG BLOOD, ED (MC, WL, AP ONLY): I-stat hCG, quantitative: <5 m[IU]/mL

## 2021-04-11 LAB — APTT: aPTT: 28 seconds (ref 24–36)

## 2021-04-11 LAB — I-STAT CHEM 8, ED
BUN: 13 mg/dL (ref 6–20)
Calcium, Ion: 1.2 mmol/L (ref 1.15–1.40)
Chloride: 102 mmol/L (ref 98–111)
Creatinine, Ser: 0.9 mg/dL (ref 0.44–1.00)
Glucose, Bld: 167 mg/dL — ABNORMAL HIGH (ref 70–99)
HCT: 39 % (ref 36.0–46.0)
Hemoglobin: 13.3 g/dL (ref 12.0–15.0)
Potassium: 3.9 mmol/L (ref 3.5–5.1)
Sodium: 139 mmol/L (ref 135–145)
TCO2: 26 mmol/L (ref 22–32)

## 2021-04-11 LAB — RESP PANEL BY RT-PCR (FLU A&B, COVID) ARPGX2
Influenza A by PCR: NEGATIVE
Influenza B by PCR: NEGATIVE
SARS Coronavirus 2 by RT PCR: NEGATIVE

## 2021-04-11 LAB — CREATININE, SERUM
Creatinine, Ser: 0.97 mg/dL (ref 0.44–1.00)
GFR, Estimated: >60 mL/min (ref >60)

## 2021-04-11 LAB — ETHANOL: Alcohol, Ethyl (B): <10 mg/dL (ref <10)

## 2021-04-11 LAB — CBG MONITORING, ED: Glucose-Capillary: 182 mg/dL — ABNORMAL HIGH (ref 70–99)

## 2021-04-11 LAB — PROTIME-INR
INR: 1 (ref 0.8–1.2)
Prothrombin Time: 12.7 seconds (ref 11.4–15.2)

## 2021-04-11 LAB — GLUCOSE, CAPILLARY: Glucose-Capillary: 161 mg/dL — ABNORMAL HIGH (ref 70–99)

## 2021-04-11 LAB — ANTITHROMBIN III: AntiThromb III Func: 109 % (ref 75–120)

## 2021-04-11 MED ORDER — MONTELUKAST SODIUM 10 MG PO TABS
10.0000 mg | ORAL_TABLET | Freq: Every day | ORAL | Status: DC
Start: 2021-04-11 — End: 2021-04-13
  Administered 2021-04-11 – 2021-04-12 (×2): 10 mg via ORAL
  Filled 2021-04-11 (×2): qty 1

## 2021-04-11 MED ORDER — LORATADINE 10 MG PO TABS
5.0000 mg | ORAL_TABLET | Freq: Every evening | ORAL | Status: DC
  Administered 2021-04-11 – 2021-04-12 (×2): 5 mg via ORAL
  Filled 2021-04-11 (×2): qty 1

## 2021-04-11 MED ORDER — FLUOXETINE HCL 20 MG PO CAPS
40.0000 mg | ORAL_CAPSULE | Freq: Every day | ORAL | Status: DC
  Administered 2021-04-11 – 2021-04-13 (×3): 40 mg via ORAL
  Filled 2021-04-11 (×3): qty 2

## 2021-04-11 MED ORDER — ENOXAPARIN SODIUM 40 MG/0.4ML IJ SOSY
40.0000 mg | PREFILLED_SYRINGE | INTRAMUSCULAR | Status: DC
  Administered 2021-04-11 – 2021-04-12 (×2): 40 mg via SUBCUTANEOUS
  Filled 2021-04-11 (×2): qty 0.4

## 2021-04-11 MED ORDER — SENNOSIDES-DOCUSATE SODIUM 8.6-50 MG PO TABS: 1.0000 | ORAL_TABLET | Freq: Every evening | ORAL | Status: DC | PRN

## 2021-04-11 MED ORDER — STROKE: EARLY STAGES OF RECOVERY BOOK
Freq: Once | Status: AC
  Filled 2021-04-11 (×2): qty 1

## 2021-04-11 MED ORDER — ASPIRIN EC 81 MG PO TBEC
81.0000 mg | DELAYED_RELEASE_TABLET | Freq: Every day | ORAL | Status: DC
  Administered 2021-04-11 – 2021-04-13 (×3): 81 mg via ORAL
  Filled 2021-04-11 (×3): qty 1

## 2021-04-11 MED ORDER — MECLIZINE HCL 25 MG PO TABS: 25.0000 mg | ORAL_TABLET | Freq: Three times a day (TID) | ORAL | Status: DC | PRN

## 2021-04-11 MED ORDER — TICAGRELOR 90 MG PO TABS
90.0000 mg | ORAL_TABLET | Freq: Two times a day (BID) | ORAL | Status: DC
  Administered 2021-04-11 – 2021-04-13 (×4): 90 mg via ORAL
  Filled 2021-04-11 (×4): qty 1

## 2021-04-11 MED ORDER — LOPERAMIDE HCL 2 MG PO TABS: 2.0000 mg | ORAL_TABLET | Freq: Four times a day (QID) | ORAL | Status: DC | PRN

## 2021-04-11 MED ORDER — ACETAMINOPHEN 325 MG PO TABS: 650.0000 mg | ORAL_TABLET | ORAL | Status: DC | PRN

## 2021-04-11 MED ORDER — ACETAMINOPHEN 160 MG/5ML PO SOLN: 650.0000 mg | ORAL | Status: DC | PRN

## 2021-04-11 MED ORDER — EZETIMIBE 10 MG PO TABS
10.0000 mg | ORAL_TABLET | Freq: Every day | ORAL | Status: DC
Start: 2021-04-12 — End: 2021-04-13
  Administered 2021-04-12 – 2021-04-13 (×2): 10 mg via ORAL
  Filled 2021-04-11 (×2): qty 1

## 2021-04-11 MED ORDER — INSULIN ASPART 100 UNIT/ML IJ SOLN: 0.0000 [IU] | Freq: Three times a day (TID) | INTRAMUSCULAR | Status: DC

## 2021-04-11 MED ORDER — ONDANSETRON HCL 4 MG PO TABS: 4.0000 mg | ORAL_TABLET | Freq: Three times a day (TID) | ORAL | Status: DC | PRN

## 2021-04-11 MED ORDER — ROPINIROLE HCL 0.25 MG PO TABS
0.2500 mg | ORAL_TABLET | Freq: Every day | ORAL | Status: DC
  Administered 2021-04-11 – 2021-04-12 (×2): 0.5 mg via ORAL
  Filled 2021-04-11 (×3): qty 2

## 2021-04-11 MED ORDER — ACETAMINOPHEN 650 MG RE SUPP: 650.0000 mg | RECTAL | Status: DC | PRN

## 2021-04-11 NOTE — ED Provider Triage Note (Signed)
Emergency Medicine Provider Triage Evaluation Note ? ?Hannah Mcdaniel , a 56 y.o. female  was evaluated in triage.  Pt complains of left-sided facial numbness and vision changes.  Patient had recent stroke in February requiring TNK.  She states that today around noon she began having recurrent left-sided facial numbness and began having blurry vision in the left eye.  She states that the facial numbness is residual from her previous stroke however she has not had any visual changes prior to now.  She denies dysarthria, numbness or tingling in her arms.  She states she has residual left lower extremity weakness which is present.  Currently on aspirin, Brilinta ? ?Review of Systems  ?Positive: See above ?Negative:  ? ?Physical Exam  ?BP 136/84 (BP Location: Right Arm)   Pulse 82   Temp 98.1 ?F (36.7 ?C) (Oral)   Resp 18   SpO2 100%  ?Gen:   Awake, no distress   ?Resp:  Normal effort  ?MSK:   Moves extremities without difficulty  ?Other:  No cranial nerve deficits.  She does have a peripheral field deficit to the left eye. ? ?Medical Decision Making  ?Medically screening exam initiated at 1:11 PM.  Appropriate orders placed.  TERICA YOGI was informed that the remainder of the evaluation will be completed by another provider, this initial triage assessment does not replace that evaluation, and the importance of remaining in the ED until their evaluation is complete. ? ?We will call code stroke.  Triage RN made aware.  Dr. Melina Copa made aware. ?  ?Mickie Hillier, PA-C ?04/11/21 1313 ? ?

## 2021-04-11 NOTE — Assessment & Plan Note (Addendum)
-   Continue statin therapy ?-Hemoglobin A1c 6.2, patient was placed on sliding scale insulin while inpatient ?

## 2021-04-11 NOTE — ED Triage Notes (Signed)
Patient BIB [facility] for evaluation of sudden onset of left sided facial numbness and worsened left sided blurred vision. Patient states she had a stroke one month ago leaving her with residual left sided weakness and blurred vision in her left eye and was concerned she was having another stroke but symptoms resolved en route. Patient alert, oriented, and in no apparent distress at this time. Patient has no facial droop, no dysarthria, no new arm drift, residual left arm weakness from stroke, residual left sided sensation deficit from previous stroke. ?

## 2021-04-11 NOTE — Plan of Care (Signed)

## 2021-04-11 NOTE — Assessment & Plan Note (Addendum)
-   BP stable resume antihypertensives ?

## 2021-04-11 NOTE — Code Documentation (Signed)
Stroke Response Nurse Documentation ?Code Documentation ? ?SHAKINAH NAVIS is a 56 y.o. female arriving to [place]  via  [facility]  on 04/11/21 with past medical hx of DM, HLD, GERD, Stroke 67monthwith TNK, migraines. On aspirin 81 mg daily. Code stroke was activated by ED.  ? ?Patient from triage where she was LKW at 1200. She was playing with her dogs when sudden onset of left facial numbness and worsened left blurred vision occurred.  Symptoms improved en route with EMS.  ? ?Stroke team at the bedside on patient arrival. Labs drawn and patient cleared for CT by Dr. BMelina Copa Patient to CT with team. NIHSS 2, see documentation for details and code stroke times. Patient with left leg weakness and left decreased sensation on exam.  ? ?The following imaging was completed:  CT Head. Patient is not a candidate for IV Thrombolytic due to TNK 191monthgo. Patient is not not a candidate for IR due to no LVO.  ? ?Care Plan: q1h NIH and vitals and permissive hypertension.  ? ?Bedside handoff with ED RN LoWilliam Hamburger  ? ?MaNewman Nickels?Stroke Response RN ? ? ?

## 2021-04-11 NOTE — Assessment & Plan Note (Addendum)
-   Continue aspirin, Brilinta, statin ?

## 2021-04-11 NOTE — Discharge Instructions (Signed)
You were seen in the emergency department for worsening visual symptoms and numbness in the setting of a recent stroke.  You were seen by neurology.  You had an MRI.  Neurology is recommending that you follow-up outpatient with your neurologist.  Continue your regular medications. ?

## 2021-04-11 NOTE — H&P (Signed)
History and Physical    Patient: Hannah Mcdaniel GDJ:[id number] DOB: Jun 14, 1965 DOA: 04/11/2021 DOS: the patient was seen and examined on 04/11/2021 PCP: [name], FNP  Patient coming from: Home  Chief Complaint: left blurry vision and left face numbness  Chief Complaint  Patient presents with   Transient Ischemic Attack   HPI: LAKEN ROG is a 56 y.o. female with medical history significant of CVA, peripheral vascular disease, type 2 diabetes mellitus, dyslipidemia, and depression who presented with acute onset of left facial numbness and blurry vision. Acute onset of symptoms around 11:15 am today, with no triggering factors, no improving or worsening factors. Moderate in intensity. She called EMS and she was brought to the ED.  By the time she arrived to the ED her symptoms have been improving, and at the time of my evaluation, she only has mild blurry vision on the left eye.   Patient had a recent hospitalization for acute CVA on 02/20 to 03/27/2021, and prior to that another hospitalization for CVA on 02/16 to 03/23/2021. Patient was discharged on dual antiplatelet therapy with aspirin and ticagrelor for 30 days and the continue with aspirin and clopidogrel. Out patient follow up for a loop monitor per cardiology.  She had residual left sided weakness at the time of her discharge.   At home she has been recovering well, her residual left sided weakness has been improving, and she has been working with physical therapy. She has been compliant with her medications.    Review of Systems: As mentioned in the history of present illness. All other systems reviewed and are negative. Past Medical History:  Diagnosis Date   Anxiety    Arterial occlusive disease Nov. 2014   Arthritis    Claudication of lower extremity [facility]) Nov. 2014   Right Lower Extremity rest pain   Colon polyps    Depression    Diabetes mellitus without complication ([place])    GERD (gastroesophageal reflux  disease)    Hyperlipidemia    Migraines    Vertigo    Past Surgical History:  Procedure Laterality Date   ABDOMINAL AORTAGRAM N/A 01/10/2013   Procedure: ABDOMINAL AORTAGRAM;  Surgeon: Rosetta Posner, MD;  Location: [facility] CATH LAB;  Service: Cardiovascular;  Laterality: N/A;   ABDOMINAL AORTOGRAM W/LOWER EXTREMITY Bilateral 07/16/2018   Procedure: ABDOMINAL AORTOGRAM W/LOWER EXTREMITY;  Surgeon: Angelia Mould, MD;  Location: [place] CV LAB;  Service: Cardiovascular;  Laterality: Bilateral;   ABDOMINAL AORTOGRAM W/LOWER EXTREMITY N/A 10/05/2020   Procedure: ABDOMINAL AORTOGRAM W/LOWER EXTREMITY;  Surgeon: Angelia Mould, MD;  Location: [place] CV LAB;  Service: Cardiovascular;  Laterality: N/A;   BUBBLE STUDY  03/26/2021   Procedure: BUBBLE STUDY;  Surgeon: Donato Heinz, MD;  Location: [facility] ENDOSCOPY;  Service: Cardiovascular;;   CHOLECYSTECTOMY     Gall Bladder   iliac artery angioplasty and stent placement  01/10/13   LOWER EXTREMITY ANGIOGRAM Bilateral 01/10/2013   Procedure: LOWER EXTREMITY ANGIOGRAM;  Surgeon: Rosetta Posner, MD;  Location: [facility] CATH LAB;  Service: Cardiovascular;  Laterality: Bilateral;   PERCUTANEOUS STENT INTERVENTION Right 01/10/2013   Procedure: PERCUTANEOUS STENT INTERVENTION;  Surgeon: Rosetta Posner, MD;  Location: [facility] CATH LAB;  Service: Cardiovascular;  Laterality: Right;  rt common iliac stent   PERIPHERAL VASCULAR CATHETERIZATION N/A 11/26/2015   Procedure: Abdominal Aortogram w/Lower Extremity;  Surgeon: Angelia Mould, MD;  Location: [place] CV LAB;  Service: Cardiovascular;  Laterality: N/A;   PERIPHERAL VASCULAR  CATHETERIZATION Right 11/26/2015   Procedure: Peripheral Vascular Balloon Angioplasty;  Surgeon: Angelia Mould, MD;  Location: [place] CV LAB;  Service: Cardiovascular;  Laterality: Right;  rt common iliac   PERIPHERAL VASCULAR INTERVENTION  07/16/2018   Procedure: PERIPHERAL VASCULAR INTERVENTION;  Surgeon:  Angelia Mould, MD;  Location: [place] CV LAB;  Service: Cardiovascular;;   PERIPHERAL VASCULAR INTERVENTION Right 10/05/2020   Procedure: PERIPHERAL VASCULAR INTERVENTION;  Surgeon: Angelia Mould, MD;  Location: [place] CV LAB;  Service: Cardiovascular;  Laterality: Right;  Common and exteral iliac artery   TEE WITHOUT CARDIOVERSION N/A 03/26/2021   Procedure: TRANSESOPHAGEAL ECHOCARDIOGRAM (TEE);  Surgeon: Donato Heinz, MD;  Location: [facility] ENDOSCOPY;  Service: Cardiovascular;  Laterality: N/A;   TOTAL ABDOMINAL HYSTERECTOMY     Social History:  reports that she quit smoking about 3 weeks ago. Her smoking use included cigarettes. She started smoking about 43 years ago. She smoked an average of .25 packs per day. She has never used smokeless tobacco. She reports current alcohol use of about 1.0 standard drink per week. She reports that she does not use drugs.  Allergies  Allergen Reactions   Asa [Aspirin] Rash    Can take low-dose aspirin.   Bempedoic Acid Other (See Comments)    Bilateral shoulder pain.   Codeine Rash   Penicillins Rash    Has patient had a PCN reaction causing immediate rash, facial/tongue/throat swelling, SOB or lightheadedness with hypotension:Yes Has patient had a PCN reaction causing severe rash involving mucus membranes or skin necrosis:Yes Has patient had a PCN reaction that required hospitalization:No Has patient had a PCN reaction occurring within the last 10 years:No If all of the above answers are "NO", then may proceed with Cephalosporin use.     Family History  Problem Relation Age of Onset   Diabetes Mother    Hyperlipidemia Mother    Hypertension Mother    Varicose Veins Mother    COPD Mother    Stroke Mother    Anxiety disorder Mother    Depression Mother    Ovarian cancer Mother    Acute myelogenous leukemia Father    Diabetes Brother    Hypertension Brother    Hyperlipidemia Brother    Stroke Maternal  Grandmother    Anxiety disorder Maternal Grandmother    Depression Maternal Grandmother    Breast cancer Maternal Grandmother    Diabetes Maternal Grandmother    Heart disease Maternal Grandfather    Lung cancer Paternal Grandfather    Other Brother        Blood disease   Anxiety disorder Daughter    Migraines Daughter    Migraines Daughter    Migraines Son    Seizures Son     Prior to Admission medications   Medication Sig Start Date End Date Taking? Authorizing Provider  aspirin EC 81 MG tablet Take 81 mg by mouth daily.    [provider]  clopidogrel (PLAVIX) 75 MG tablet Take 1 tablet (75 mg total) by mouth daily. 04/26/21   Hosie Poisson, MD  Continuous Blood Gluc Receiver (FREESTYLE LIBRE 2 READER) DEVI 1 Device by Does not apply route continuous. 11/11/20   [name], FNP  Continuous Blood Gluc Sensor (FREESTYLE LIBRE 2 SENSOR) MISC 1 Device by Does not apply route every 14 (fourteen) days. 04/03/21   [name], FNP  Evolocumab (REPATHA SURECLICK) 798 MG/ML SOAJ Inject 140 mg into the skin every 14 (fourteen) days. 03/05/21  [name], FNP  ezetimibe (ZETIA) 10 MG tablet Take 1 tablet (10 mg total) by mouth daily. 11/07/20   [name], FNP  FLUoxetine (PROZAC) 40 MG capsule Take 1 capsule (40 mg total) by mouth daily. 11/07/20   [name], FNP  levocetirizine (XYZAL) 5 MG tablet TAKE 1 TABLET BY MOUTH EVERY DAY IN THE EVENING Patient taking differently: Take 5 mg by mouth every evening. 11/07/20   [name], FNP  loperamide (IMODIUM A-D) 2 MG tablet Take 1 tablet (2 mg total) by mouth 4 (four) times daily as needed for diarrhea or loose stools. 02/01/21   Ivy Lynn, NP  losartan (COZAAR) 50 MG tablet Take 1 tablet (50 mg total) by mouth daily. 03/28/21   [name], FNP  meclizine (ANTIVERT) 25 MG tablet Take 1 tablet (25 mg total) by mouth 3 (three) times daily as needed for dizziness. 04/10/20   [name], FNP   metFORMIN (GLUCOPHAGE) 500 MG tablet TAKE 2 TABLETS DAILY WITH BREAKFAST AND 1 TABLET DAILY WITH SUPPER. Patient taking differently: Take 500-1,000 mg by mouth See admin instructions. 1000 mg in the morning 500 mg at dinner 11/07/20   Hendricks Limes F, FNP  montelukast (SINGULAIR) 10 MG tablet Take 1 tablet (10 mg total) by mouth at bedtime. 11/07/20   [name], FNP  ondansetron (ZOFRAN) 4 MG tablet Take 1 tablet (4 mg total) by mouth every 8 (eight) hours as needed for nausea or vomiting. 02/01/21   Ivy Lynn, NP  rOPINIRole (REQUIP) 0.25 MG tablet Take 1-2 tablets (0.25-0.5 mg total) by mouth at bedtime. 04/04/21   [name], FNP  rosuvastatin (CRESTOR) 40 MG tablet Take 1 tablet (40 mg total) by mouth daily. 11/07/20   [name], FNP  senna-docusate (SENOKOT-S) 8.6-50 MG tablet Take 1 tablet by mouth at bedtime as needed for mild constipation. 03/27/21   Hosie Poisson, MD  ticagrelor (BRILINTA) 90 MG TABS tablet Take 1 tablet (90 mg total) by mouth 2 (two) times daily. 03/27/21 04/26/21  Hosie Poisson, MD  triamcinolone cream (KENALOG) 0.1 % APPLY TO AFFECTED AREA TWICE A DAY Patient taking differently: Apply 1 application topically 2 (two) times daily as needed (rash). 08/28/20   [name], FNP    Physical Exam: Vitals:   04/11/21 1336 04/11/21 1337 04/11/21 1430 04/11/21 1600  BP:  139/70  132/74  Pulse:  70 64 62  Resp:  18 16 (!) 23  Temp:      TempSrc:      SpO2:  100% 99% 98%  Weight: 61.2 kg     Height: '5\' 1"'$  (1.549 m)      Neurology awake and alert, she is has weakness on her left lower extremity 4/5 proximal, cranial nerves are intact, 2 to 12.  ENT with no pallor Cardiovascular with S1 and S2 present and rhythmic with no gallops or rubs, no murmurs Respiratory with no rales or wheezing Abdomen soft and non tender, not distended No lower extremity edema.  Data Reviewed:   56 yo female with the past medical history of prior 2 CVA within last  month, along with dyslipidemia and diabetes mellitus who presents with acute onset of focal neurologic deficit, left face numbness and blurry vision on the left, symptoms have been resolving. On her initial physical examination her blood pressure  is 139/70, HR 70, RR 18 and 02 saturation 99%, her neuro exam is non focal besides left lower  extremity weakness. Cardiopulmonary with no acute changes.   NA 136. K 3.9, cl 104. Bicarbonate 25, glucose 172, bun 12 cr 0,97  Wbc 12, hgb 14,1 hct 39,6 plt 290  Sars covid 19 negative   Head CT with no acute changes, subacute infarct within the posterior limb of the right internal capsule and right temporal stem.   Brain MRI with new/interval acute infarct in the left temporal stem. Evolving infarct in the right posterior limb of the internal capsule and right temporal stem.   EKG 74 bpm,  normal axis, normal intervals, sinus rhythm with no significant ST segment or T wave changes.   Assessment and Plan: * CVA (cerebral vascular accident) [facility]) Patient with recurrent CVA, this is the 3rd episode within last 30 days.  Plan to admit to the medical ward and place on a telemetry monitor Continue neuro checks per unit protocol Continue with antiplatelet therapy with asa and ticagrelor along with high dose high potency statin. Follow up with neurology, PT and OT evaluation.   DM type 2 with diabetic dyslipidemia (HCC) Continue insulin sliding scale for glucose cover and monitoring Her admission glucose is 172  Continue statin therapy and ezetimibe   Essential hypertension Plan to hold on antihypertensive medications due to acute CVA Continue with permissive hypertension.   Depression with anxiety Continue with medical regimen with fluoxetin.   PAD (peripheral artery disease) ([place]) Continue with statin therapy and antiplatelet therapy.       Advance Care Planning:   Code Status: Prior full  Consults: neurology   Family Communication: I spoke  with patient's husband at the bedside, we talked in detail about patient's condition, plan of care and prognosis and all questions were addressed.   Severity of Illness: The appropriate patient status for this patient is OBSERVATION. Observation status is judged to be reasonable and necessary in order to provide the required intensity of service to ensure the patient's safety. The patient's presenting symptoms, physical exam findings, and initial radiographic and laboratory data in the context of their medical condition is felt to place them at decreased risk for further clinical deterioration. Furthermore, it is anticipated that the patient will be medically stable for discharge from the hospital within 2 midnights of admission.   Author: Tawni Millers, MD 04/11/2021 4:50 PM  For on call review [email or web address].

## 2021-04-11 NOTE — Progress Notes (Signed)
MRI completed and reviewed ?In addition to the prior known right hemispheric small vessel looking strokes, she has an additional area of restricted diffusion indicating the new acute to subacute stroke by the left temporal horn ? ?Likely concomitant small vessel disease due to risk factors-TEE also done in February - negative. ? ?Can check hypercoag w/u ? ? ?Admit to hospitalist. No need to repeat full stroke w.u. ?Freq neurochecks ?Tele ?Contnue asa and brilinta ?Stroke team will follow. ? ? ?-- ?Amie Portland, MD ?Neurologist ?[facility] ?Pager: [phone] ? ? ? ?

## 2021-04-11 NOTE — Assessment & Plan Note (Addendum)
-   Patient with recurrent CVA, this is the 3rd episode within last 30 days. ?-Neurology/stroke service was consulted ?-CT angiogram head and neck showed no large vessel stenosis ?-UDS negative, LDL 25, A1c 6.2 ?-Patient had recent stroke work-up completed from previous admission ?-Neurology recommended continue aspirin and Brilinta to complete 30-day course and went back to aspirin and Plavix ?-Continue statin, has outpatient follow-up scheduled with neurology on 04/23/2021. ?-Continue 30-day CardioNet monitoring ?-PT OT evaluation was done and patient will resume PT ?-Cleared by neurology to be discharged home ?

## 2021-04-11 NOTE — Consult Note (Signed)
Neurology Consultation  Reason for Consult: Code Stroke  Referring Physician: Dr. Melina Copa  CC: Sudden facial numbness and left eye blurred vision  History is obtained from: Patient, Chart review  HPI: Hannah Mcdaniel is a 56 y.o. female with a medical history significant for type 2 diabetes mellitus, essential hypertension, hyperlipidemia, migraine headaches, GERD, tobacco use, peripheral vascular disease on Plavix, and recent CVA in February 2023 (2/18) s/p TNKase after presenting with sudden onset of left leg weakness and decreased sensation in the left arm and left leg.  Patient reports residual left lower extremity weakness, left eye blurry vision, and left hand numbness and weakness from her stroke in February.  Patient has been on dual antiplatelet therapy outpatient.  Patient represented to the ED on 2/20 for worsening of left-sided weakness with subsequent falls and imaging at that time revealed enlargement of her previous infarct and was changed from aspirin and Plavix to aspirin and Brilinta at this time.  Today the patient presented to triage via [facility] after she had a sudden onset of full facial numbness at 12:00 PM.  This quickly resolved but then she noticed that she had increased blurry vision in the left eye from her baseline since her previous stroke and subsequently activated EMS.  LKW: 12:00 TNK given?: no, patient with recent CVA s/p TNKase on 03/23/2021 IR Thrombectomy? No, presentation is not consistent with an LVO Modified Rankin Scale: 1-No significant post stroke disability and can perform usual duties with stroke symptoms  ROS: A complete ROS was performed and is negative except as noted in the HPI.   Past Medical History:  Diagnosis Date   Anxiety    Arterial occlusive disease Nov. 2014   Arthritis    Claudication of lower extremity [facility]) Nov. 2014   Right Lower Extremity rest pain   Colon polyps    Depression    Diabetes mellitus without complication  ([place])    GERD (gastroesophageal reflux disease)    Hyperlipidemia    Migraines    Vertigo    Past Surgical History:  Procedure Laterality Date   ABDOMINAL AORTAGRAM N/A 01/10/2013   Procedure: ABDOMINAL AORTAGRAM;  Surgeon: Rosetta Posner, MD;  Location: [facility] CATH LAB;  Service: Cardiovascular;  Laterality: N/A;   ABDOMINAL AORTOGRAM W/LOWER EXTREMITY Bilateral 07/16/2018   Procedure: ABDOMINAL AORTOGRAM W/LOWER EXTREMITY;  Surgeon: Angelia Mould, MD;  Location: [place] CV LAB;  Service: Cardiovascular;  Laterality: Bilateral;   ABDOMINAL AORTOGRAM W/LOWER EXTREMITY N/A 10/05/2020   Procedure: ABDOMINAL AORTOGRAM W/LOWER EXTREMITY;  Surgeon: Angelia Mould, MD;  Location: [place] CV LAB;  Service: Cardiovascular;  Laterality: N/A;   BUBBLE STUDY  03/26/2021   Procedure: BUBBLE STUDY;  Surgeon: Donato Heinz, MD;  Location: [facility] ENDOSCOPY;  Service: Cardiovascular;;   CHOLECYSTECTOMY     Gall Bladder   iliac artery angioplasty and stent placement  01/10/13   LOWER EXTREMITY ANGIOGRAM Bilateral 01/10/2013   Procedure: LOWER EXTREMITY ANGIOGRAM;  Surgeon: Rosetta Posner, MD;  Location: [facility] CATH LAB;  Service: Cardiovascular;  Laterality: Bilateral;   PERCUTANEOUS STENT INTERVENTION Right 01/10/2013   Procedure: PERCUTANEOUS STENT INTERVENTION;  Surgeon: Rosetta Posner, MD;  Location: [facility] CATH LAB;  Service: Cardiovascular;  Laterality: Right;  rt common iliac stent   PERIPHERAL VASCULAR CATHETERIZATION N/A 11/26/2015   Procedure: Abdominal Aortogram w/Lower Extremity;  Surgeon: Angelia Mould, MD;  Location: [place] CV LAB;  Service: Cardiovascular;  Laterality: N/A;   PERIPHERAL VASCULAR CATHETERIZATION Right 11/26/2015  Procedure: Peripheral Vascular Balloon Angioplasty;  Surgeon: Angelia Mould, MD;  Location: [place] CV LAB;  Service: Cardiovascular;  Laterality: Right;  rt common iliac   PERIPHERAL VASCULAR INTERVENTION  07/16/2018   Procedure:  PERIPHERAL VASCULAR INTERVENTION;  Surgeon: Angelia Mould, MD;  Location: [place] CV LAB;  Service: Cardiovascular;;   PERIPHERAL VASCULAR INTERVENTION Right 10/05/2020   Procedure: PERIPHERAL VASCULAR INTERVENTION;  Surgeon: Angelia Mould, MD;  Location: [place] CV LAB;  Service: Cardiovascular;  Laterality: Right;  Common and exteral iliac artery   TEE WITHOUT CARDIOVERSION N/A 03/26/2021   Procedure: TRANSESOPHAGEAL ECHOCARDIOGRAM (TEE);  Surgeon: Donato Heinz, MD;  Location: [facility] ENDOSCOPY;  Service: Cardiovascular;  Laterality: N/A;   TOTAL ABDOMINAL HYSTERECTOMY     Family History  Problem Relation Age of Onset   Diabetes Mother    Hyperlipidemia Mother    Hypertension Mother    Varicose Veins Mother    COPD Mother    Stroke Mother    Anxiety disorder Mother    Depression Mother    Ovarian cancer Mother    Acute myelogenous leukemia Father    Diabetes Brother    Hypertension Brother    Hyperlipidemia Brother    Stroke Maternal Grandmother    Anxiety disorder Maternal Grandmother    Depression Maternal Grandmother    Breast cancer Maternal Grandmother    Diabetes Maternal Grandmother    Heart disease Maternal Grandfather    Lung cancer Paternal Grandfather    Other Brother        Blood disease   Anxiety disorder Daughter    Migraines Daughter    Migraines Daughter    Migraines Son    Seizures Son    Social History:   reports that she quit smoking about 3 weeks ago. Her smoking use included cigarettes. She started smoking about 43 years ago. She smoked an average of .25 packs per day. She has never used smokeless tobacco. She reports current alcohol use of about 1.0 standard drink per week. She reports that she does not use drugs.  Medications No current facility-administered medications for this encounter.  Current Outpatient Medications:    aspirin EC 81 MG tablet, Take 81 mg by mouth daily., Disp: , Rfl:    [START ON 04/26/2021]  clopidogrel (PLAVIX) 75 MG tablet, Take 1 tablet (75 mg total) by mouth daily., Disp: 30 tablet, Rfl: 11   Continuous Blood Gluc Receiver (FREESTYLE LIBRE 2 READER) DEVI, 1 Device by Does not apply route continuous., Disp: 1 each, Rfl: 0   Continuous Blood Gluc Sensor (FREESTYLE LIBRE 2 SENSOR) MISC, 1 Device by Does not apply route every 14 (fourteen) days., Disp: 2 each, Rfl: 5   Evolocumab (REPATHA SURECLICK) 338 MG/ML SOAJ, Inject 140 mg into the skin every 14 (fourteen) days., Disp: 2 mL, Rfl: 6   ezetimibe (ZETIA) 10 MG tablet, Take 1 tablet (10 mg total) by mouth daily., Disp: 90 tablet, Rfl: 1   FLUoxetine (PROZAC) 40 MG capsule, Take 1 capsule (40 mg total) by mouth daily., Disp: 90 capsule, Rfl: 1   levocetirizine (XYZAL) 5 MG tablet, TAKE 1 TABLET BY MOUTH EVERY DAY IN THE EVENING (Patient taking differently: Take 5 mg by mouth every evening.), Disp: 90 tablet, Rfl: 1   loperamide (IMODIUM A-D) 2 MG tablet, Take 1 tablet (2 mg total) by mouth 4 (four) times daily as needed for diarrhea or loose stools., Disp: 30 tablet, Rfl: 0   losartan (COZAAR) 50 MG tablet, Take  1 tablet (50 mg total) by mouth daily., Disp: 30 tablet, Rfl: 2   meclizine (ANTIVERT) 25 MG tablet, Take 1 tablet (25 mg total) by mouth 3 (three) times daily as needed for dizziness., Disp: 30 tablet, Rfl: 5   metFORMIN (GLUCOPHAGE) 500 MG tablet, TAKE 2 TABLETS DAILY WITH BREAKFAST AND 1 TABLET DAILY WITH SUPPER. (Patient taking differently: Take 500-1,000 mg by mouth See admin instructions. 1000 mg in the morning 500 mg at dinner), Disp: 270 tablet, Rfl: 1   montelukast (SINGULAIR) 10 MG tablet, Take 1 tablet (10 mg total) by mouth at bedtime., Disp: 90 tablet, Rfl: 1   ondansetron (ZOFRAN) 4 MG tablet, Take 1 tablet (4 mg total) by mouth every 8 (eight) hours as needed for nausea or vomiting., Disp: 20 tablet, Rfl: 0   rOPINIRole (REQUIP) 0.25 MG tablet, Take 1-2 tablets (0.25-0.5 mg total) by mouth at bedtime., Disp: 60  tablet, Rfl: 1   rosuvastatin (CRESTOR) 40 MG tablet, Take 1 tablet (40 mg total) by mouth daily., Disp: 90 tablet, Rfl: 1   senna-docusate (SENOKOT-S) 8.6-50 MG tablet, Take 1 tablet by mouth at bedtime as needed for mild constipation., Disp: , Rfl:    ticagrelor (BRILINTA) 90 MG TABS tablet, Take 1 tablet (90 mg total) by mouth 2 (two) times daily., Disp: 60 tablet, Rfl: 0   triamcinolone cream (KENALOG) 0.1 %, APPLY TO AFFECTED AREA TWICE A DAY (Patient taking differently: Apply 1 application topically 2 (two) times daily as needed (rash).), Disp: 60 g, Rfl: 1  Exam: Current vital signs: BP 136/84 (BP Location: Right Arm)    Pulse 82    Temp 98.1 F (36.7 C) (Oral)    Resp 18    SpO2 100%  Vital signs in last 24 hours: Temp:  [98.1 F (36.7 C)] 98.1 F (36.7 C) (03/09 1245) Pulse Rate:  [82] 82 (03/09 1245) Resp:  [18] 18 (03/09 1245) BP: (136)/(84) 136/84 (03/09 1245) SpO2:  [100 %] 100 % (03/09 1245)  GENERAL: Awake, alert, in no acute distress Psych: Affect appropriate for situation, patient is calm and cooperative with examination Head: Normocephalic and atraumatic, without obvious abnormality EENT: Normal conjunctivae, dry mucous membranes, no OP obstruction, poor dentition noted  LUNGS: Normal respiratory effort. Non-labored breathing on room air CV: Regular rate and rhythm on telemetry ABDOMEN: Soft, non-tender, non-distended Extremities: warm, well perfused, without obvious deformity  NEURO:  Mental Status: Awake, alert, and oriented to person, place, time, and situation. She is able to provide a clear and coherent history of present illness. Speech/Language: speech is intact without dysarthria.   Naming, repetition, fluency, and comprehension intact without aphasia. No neglect is noted Cranial Nerves:  II: PERRL 3 mm/brisk. Visual fields full with blurry vision reported through the left eye without visual field cut III, IV, VI: EOMI without ptosis, nystagmus, or gaze  preference V: Sensation is intact to light touch and symmetrical to face.  VII: Face is symmetric resting and smiling.  VIII: Hearing is intact to voice IX, X: Palate elevation is symmetric. Phonation normal.  XI: Normal sternocleidomastoid and trapezius muscle strength XII: Tongue protrudes midline without fasciculations.   Motor: Patient is able to elevate the right upper and lower extremities and the left upper extremity antigravity without vertical drift. Patient's left lower extremity is weak (deficit from previous stroke in February of 2023) with vertical drift on assessment.  Tone is normal. Bulk is normal.  Sensation: Intact to light touch bilaterally in all four extremities with the  exception of decreased sensation to light touch on the left upper extremity.  Coordination: FTN intact bilaterally. HKS intact bilaterally. No pronator drift.  Gait: Deferred  NIHSS: 1a Level of Conscious.: 0 1b LOC Questions: 0 1c LOC Commands: 0 2 Best Gaze: 0 3 Visual: 0 4 Facial Palsy: 0 5a Motor Arm - left: 0 5b Motor Arm - Right: 0 6a Motor Leg - Left: 1 6b Motor Leg - Right: 0 7 Limb Ataxia: 0 8 Sensory: 1 9 Best Language: 0 10 Dysarthria: 0 11 Extinct. and Inatten.: 0 TOTAL: 2  Labs I have reviewed labs in epic and the results pertinent to this consultation are: CBC    Component Value Date/Time   WBC 12.0 (H) 04/11/2021 1311   RBC 4.47 04/11/2021 1311   HGB 13.3 04/11/2021 1321   HGB 14.7 04/03/2021 0952   HCT 39.0 04/11/2021 1321   HCT 41.3 04/03/2021 0952   PLT 290 04/11/2021 1311   PLT 349 04/03/2021 0952   MCV 88.6 04/11/2021 1311   MCV 87 04/03/2021 0952   MCH 31.5 04/11/2021 1311   MCHC 35.6 04/11/2021 1311   RDW 11.8 04/11/2021 1311   RDW 11.7 04/03/2021 0952   LYMPHSABS 4.0 04/11/2021 1311   LYMPHSABS 2.9 04/03/2021 0952   MONOABS 0.5 04/11/2021 1311   EOSABS 0.1 04/11/2021 1311   EOSABS 0.1 04/03/2021 0952   BASOSABS 0.1 04/11/2021 1311   BASOSABS 0.1  04/03/2021 0952   CMP     Component Value Date/Time   NA 139 04/11/2021 1321   NA 139 04/03/2021 0952   K 3.9 04/11/2021 1321   CL 102 04/11/2021 1321   CO2 20 04/03/2021 0952   GLUCOSE 167 (H) 04/11/2021 1321   BUN 13 04/11/2021 1321   BUN 14 04/03/2021 0952   CREATININE 0.90 04/11/2021 1321   CREATININE 0.63 02/04/2013 1200   CALCIUM 10.0 04/03/2021 0952   PROT 7.1 04/03/2021 0952   ALBUMIN 4.7 04/03/2021 0952   AST 15 04/03/2021 0952   ALT 10 04/03/2021 0952   ALKPHOS 121 04/03/2021 0952   BILITOT 0.5 04/03/2021 0952   GFRNONAA >60 03/26/2021 0154   GFRAA 112 06/29/2019 1404   Lipid Panel     Component Value Date/Time   CHOL 261 (H) 03/22/2021 0107   CHOL 263 (H) 11/07/2020 1508   TRIG 288 (H) 03/22/2021 0107   HDL 41 03/22/2021 0107   HDL 31 (L) 11/07/2020 1508   CHOLHDL 6.4 03/22/2021 0107   VLDL 58 (H) 03/22/2021 0107   LDLCALC 162 (H) 03/22/2021 0107   LDLCALC 154 (H) 11/07/2020 1508   Lab Results  Component Value Date   HGBA1C 6.1 (H) 03/22/2021   Imaging I have reviewed the images obtained:  CT-scan of the brain 3/9: - No evidence of acute intracranial abnormality. - Redemonstrated subacute infarct within the posterior limb of right internal capsule and right temporal stem. - Background mild chronic small vessel ischemic changes within the cerebral white matter, stable from the head CT of 03/25/2021. - Redemonstrated small chronic infarcts within the superior right cerebellar hemisphere. - Unchanged moderate ventriculomegaly which is nonspecific, but could reflect sequela of normal pressure hydrocephalus in the appropriate clinical setting.  Assessment: 56 y.o. female with recent CVA in February 2023 s/p TNKase with residual left lower extremity weakness, left hand weakness and numbness, and left eye blurry vision who presented to the ED today for evaluation of acute onset of full facial numbness with full resolution followed by increased blurry vision  in  the left eye. - Examination reveals patient with left lower extremity weakness and vertical drift (present since CVA in February) and subjectively increased blurred vision in the left eye without visual field cut.  - Cannot rule out new ischemia at this time. Will obtain MRI brain for further evaluation. Patient with recent stroke work up and on adequate stroke prophylaxis. If new CVA on imaging, will need admission for further stroke work up and evaluation.  - Stroke risk factors include HTN, HLD, DM2, recent CVA, tobacco use, and PVD.   Recommendations: - MRI brain wo contrast  - If MRI brain with evidence of acute infarction, will expand stroke work up at that time though patient with a CVA one month ago and she should be on adequate prophylaxis at this time-ASA and Brilinta for 30 days from 03/26/21 followed by ASA only. - will follow imaging and update EDP   Please reach out to neurologist on call for further questions or concerns.   Anibal Henderson, AGAC-NP [facility] Pager: [phone]   Attending Neurohospitalist Addendum Patient seen and examined with APP/Resident. Agree with the history and physical as documented above. Agree with the plan as documented, which I helped formulate. I have independently reviewed the chart, obtained history, review of systems and examined the patient.I have personally reviewed pertinent head/neck/spine imaging (CT/MRI). Please feel free to call with any questions.  -- Amie Portland, MD Neurologist [facility] Pager: [phone]

## 2021-04-11 NOTE — ED Notes (Signed)
Admitting provider at bedside to evaluate patient and update her on admission and plan of care.  ?

## 2021-04-11 NOTE — ED Notes (Signed)
Patient transported to MRI 

## 2021-04-11 NOTE — ED Provider Notes (Signed)
[facility] EMERGENCY DEPARTMENT Provider Note   CSN: [id number] Arrival date & time: 04/11/21  1240     History  Chief Complaint  Patient presents with   Transient Ischemic Attack    Hannah Mcdaniel is a 56 y.o. female.  He has a prior history of stroke and received TNK last month.  Residual left-sided facial numbness.  Complaining of acute onset of worsening facial numbness and decreased vision left eye.  Started at 12 PM today.  No weakness.  Currently on aspirin and Brilinta dual platelet therapy.  She was activated as a code stroke.  The history is provided by the patient.  Cerebrovascular Accident This is a new problem. The current episode started 1 to 2 hours ago. The problem occurs constantly. The problem has been gradually improving. Pertinent negatives include no chest pain, no abdominal pain, no headaches and no shortness of breath. Nothing aggravates the symptoms. Nothing relieves the symptoms. She has tried nothing for the symptoms. The treatment provided mild relief.      Home Medications Prior to Admission medications   Medication Sig Start Date End Date Taking? Authorizing Provider  aspirin EC 81 MG tablet Take 81 mg by mouth daily.    [provider]  clopidogrel (PLAVIX) 75 MG tablet Take 1 tablet (75 mg total) by mouth daily. 04/26/21   Hosie Poisson, MD  Continuous Blood Gluc Receiver (FREESTYLE LIBRE 2 READER) DEVI 1 Device by Does not apply route continuous. 11/11/20   [name], FNP  Continuous Blood Gluc Sensor (FREESTYLE LIBRE 2 SENSOR) MISC 1 Device by Does not apply route every 14 (fourteen) days. 04/03/21   [name], FNP  Evolocumab (REPATHA SURECLICK) 326 MG/ML SOAJ Inject 140 mg into the skin every 14 (fourteen) days. 03/05/21   [name], FNP  ezetimibe (ZETIA) 10 MG tablet Take 1 tablet (10 mg total) by mouth daily. 11/07/20   [name], FNP  FLUoxetine (PROZAC) 40 MG capsule Take 1 capsule (40 mg total)  by mouth daily. 11/07/20   [name], FNP  levocetirizine (XYZAL) 5 MG tablet TAKE 1 TABLET BY MOUTH EVERY DAY IN THE EVENING Patient taking differently: Take 5 mg by mouth every evening. 11/07/20   [name], FNP  loperamide (IMODIUM A-D) 2 MG tablet Take 1 tablet (2 mg total) by mouth 4 (four) times daily as needed for diarrhea or loose stools. 02/01/21   Ivy Lynn, NP  losartan (COZAAR) 50 MG tablet Take 1 tablet (50 mg total) by mouth daily. 03/28/21   [name], FNP  meclizine (ANTIVERT) 25 MG tablet Take 1 tablet (25 mg total) by mouth 3 (three) times daily as needed for dizziness. 04/10/20   [name], FNP  metFORMIN (GLUCOPHAGE) 500 MG tablet TAKE 2 TABLETS DAILY WITH BREAKFAST AND 1 TABLET DAILY WITH SUPPER. Patient taking differently: Take 500-1,000 mg by mouth See admin instructions. 1000 mg in the morning 500 mg at dinner 11/07/20   Hendricks Limes F, FNP  montelukast (SINGULAIR) 10 MG tablet Take 1 tablet (10 mg total) by mouth at bedtime. 11/07/20   [name], FNP  ondansetron (ZOFRAN) 4 MG tablet Take 1 tablet (4 mg total) by mouth every 8 (eight) hours as needed for nausea or vomiting. 02/01/21   Ivy Lynn, NP  rOPINIRole (REQUIP) 0.25 MG tablet Take 1-2 tablets (0.25-0.5 mg total) by mouth at bedtime. 04/04/21   [name], FNP  rosuvastatin (  CRESTOR) 40 MG tablet Take 1 tablet (40 mg total) by mouth daily. 11/07/20   [name], FNP  senna-docusate (SENOKOT-S) 8.6-50 MG tablet Take 1 tablet by mouth at bedtime as needed for mild constipation. 03/27/21   Hosie Poisson, MD  ticagrelor (BRILINTA) 90 MG TABS tablet Take 1 tablet (90 mg total) by mouth 2 (two) times daily. 03/27/21 04/26/21  Hosie Poisson, MD  triamcinolone cream (KENALOG) 0.1 % APPLY TO AFFECTED AREA TWICE A DAY Patient taking differently: Apply 1 application topically 2 (two) times daily as needed (rash). 08/28/20   [name], FNP      Allergies    Asa [aspirin],  Bempedoic acid, Codeine, and Penicillins    Review of Systems   Review of Systems  Constitutional:  Negative for fever.  HENT:  Negative for sore throat.   Eyes:  Positive for visual disturbance.  Respiratory:  Negative for shortness of breath.   Cardiovascular:  Negative for chest pain.  Gastrointestinal:  Negative for abdominal pain.  Genitourinary:  Negative for dysuria.  Musculoskeletal:  Negative for neck pain.  Skin:  Negative for rash.  Neurological:  Positive for numbness. Negative for headaches.   Physical Exam Updated Vital Signs BP 136/84 (BP Location: Right Arm)    Pulse 82    Temp 98.1 F (36.7 C) (Oral)    Resp 18    SpO2 100%  Physical Exam Vitals and nursing note reviewed.  Constitutional:      General: She is not in acute distress.    Appearance: Normal appearance. She is well-developed.  HENT:     Head: Normocephalic and atraumatic.  Eyes:     Conjunctiva/sclera: Conjunctivae normal.  Cardiovascular:     Rate and Rhythm: Normal rate and regular rhythm.     Heart sounds: No murmur heard. Pulmonary:     Effort: Pulmonary effort is normal. No respiratory distress.     Breath sounds: Normal breath sounds.  Abdominal:     Palpations: Abdomen is soft.     Tenderness: There is no abdominal tenderness.  Musculoskeletal:        General: No swelling. Normal range of motion.     Cervical back: Neck supple.  Skin:    General: Skin is warm and dry.     Capillary Refill: Capillary refill takes less than 2 seconds.  Neurological:     Mental Status: She is alert.     Motor: No weakness.  Psychiatric:        Mood and Affect: Mood normal.    ED Results / Procedures / Treatments   Labs (all labs ordered are listed, but only abnormal results are displayed) Labs Reviewed  CBC - Abnormal; Notable for the following components:      Result Value   WBC 12.0 (*)    All other components within normal limits  COMPREHENSIVE METABOLIC PANEL - Abnormal; Notable for the  following components:   Glucose, Bld 172 (*)    All other components within normal limits  CBC - Abnormal; Notable for the following components:   WBC 10.9 (*)    HCT 35.6 (*)    All other components within normal limits  I-STAT CHEM 8, ED - Abnormal; Notable for the following components:   Glucose, Bld 167 (*)    All other components within normal limits  CBG MONITORING, ED - Abnormal; Notable for the following components:   Glucose-Capillary 182 (*)    All other components within normal limits  RESP  PANEL BY RT-PCR (FLU A&B, COVID) ARPGX2  ETHANOL  PROTIME-INR  APTT  DIFFERENTIAL  ANTITHROMBIN III  CREATININE, SERUM  RAPID URINE DRUG SCREEN, HOSP PERFORMED  URINALYSIS, ROUTINE W REFLEX MICROSCOPIC  PROTEIN C ACTIVITY  PROTEIN C, TOTAL  PROTEIN S ACTIVITY  PROTEIN S, TOTAL  LUPUS ANTICOAGULANT PANEL  BETA-2-GLYCOPROTEIN I ABS, IGG/M/A  HOMOCYSTEINE  FACTOR 5 LEIDEN  PROTHROMBIN GENE MUTATION  CARDIOLIPIN ANTIBODIES, IGG, IGM, IGA  HEMOGLOBIN A1C  LIPID PANEL  I-STAT BETA HCG BLOOD, ED ([facility], [facility], [facility] ONLY)    EKG EKG Interpretation  Date/Time:  Thursday April 11 2021 13:34:46 EST Ventricular Rate:  74 PR Interval:  158 QRS Duration: 90 QT Interval:  397 QTC Calculation: 441 R Axis:   44 Text Interpretation: Sinus rhythm Probable left atrial enlargement Borderline T wave abnormalities No significant change since prior 2/23 Confirmed by Aletta Edouard [id number]) on 04/11/2021 1:36:09 PM  Radiology MR BRAIN WO CONTRAST  Result Date: 04/11/2021 CLINICAL DATA:  Neuro deficit, acute, stroke suspected visual loss EXAM: MRI HEAD WITHOUT CONTRAST TECHNIQUE: Multiplanar, multiecho pulse sequences of the brain and surrounding structures were obtained without intravenous contrast. COMPARISON:  MRI March 25, 2021. FINDINGS: Brain: New/interval acute infarct in the LEFT temporal stem. Evolving infarct in the right posterior limb of the internal capsule and RIGHT temporal stem with  decreasing edema and less avid restricted diffusion. Small remote right cerebellar infarcts. T2 hyperintensities elsewhere in the cerebral white matter bilaterally and in the pons are unchanged and are nonspecific but compatible with mild chronic microvascular ischemic disease. Unchanged moderate lateral and third ventriculomegaly. Normal cerebral sulci. No evidence of acute hemorrhage, mass lesion, midline shift. Vascular: Major arterial flow voids are maintained at the skull base. Skull and upper cervical spine: Normal marrow signal. Sinuses/Orbits: Clear sinuses.  Unremarkable orbits. Other: Small left mastoid effusion. IMPRESSION: 1. New/interval acute infarct in the LEFT temporal stem. 2. Evolving infarct in the right posterior limb of the internal capsule and RIGHT temporal stem. 3. Mild chronic microvascular ischemic disease and small remote cerebellar infarcts. 4. Unchanged ventriculomegaly, which is nonspecific but could represent normal pressure hydrocephalus in the correct clinical setting. Electronically Signed   By: Margaretha Sheffield M.D.   On: 04/11/2021 16:01   CT HEAD CODE STROKE WO CONTRAST  Result Date: 04/11/2021 CLINICAL DATA:  Neuro deficit, acute, stroke suspected. Additional history provided: Left-sided weakness, left eye blurred vision. EXAM: CT HEAD WITHOUT CONTRAST TECHNIQUE: Contiguous axial images were obtained from the base of the skull through the vertex without intravenous contrast. RADIATION DOSE REDUCTION: This exam was performed according to the departmental dose-optimization program which includes automated exposure control, adjustment of the mA and/or kV according to patient size and/or use of iterative reconstruction technique. COMPARISON:  Brain MRI 03/25/2021.  Head CT 03/25/2021. FINDINGS: Brain: Moderate lateral and third ventriculomegaly, unchanged. Redemonstrated subacute infarct within the posterior limb of right internal capsule and right temporal stem. Mild patchy and  ill-defined hypoattenuation elsewhere within the cerebral white matter, nonspecific but compatible chronic small vessel ischemic disease. Redemonstrated small chronic infarcts within the superior right cerebellar hemisphere. There is no acute intracranial hemorrhage. No acute demarcated cortical infarct. No extra-axial fluid collection. No evidence of an intracranial mass. No midline shift. Vascular: No hyperdense vessel.  Atherosclerotic calcifications. Skull: Normal. Negative for fracture or focal lesion. Sinuses/Orbits: Visualized orbits show no acute finding. Small mucous retention cyst within a posterior left ethmoid air cell. ASPECTS [facility] Stroke Program Early CT Score) - Ganglionic level infarction (  caudate, lentiform nuclei, internal capsule, insula, M1-M3 cortex): 7 - Supraganglionic infarction (M4-M6 cortex): 3 Total score (0-10 with 10 being normal): 10 (when discounting a subacute infarct within the posterior limb of right internal capsule and right temporal stem). These results were communicated to Dr. Rory Percy at 1:35 pmon 3/9/2023by text page via the [facility] messaging system. IMPRESSION: No evidence of acute intracranial abnormality. Redemonstrated subacute infarct within the posterior limb of right internal capsule and right temporal stem. Background mild chronic small vessel ischemic changes within the cerebral white matter, stable from the head CT of 03/25/2021. Redemonstrated small chronic infarcts within the superior right cerebellar hemisphere. Unchanged moderate ventriculomegaly which is nonspecific, but could reflect sequela of normal pressure hydrocephalus in the appropriate clinical setting. Electronically Signed   By: Kellie Simmering D.O.   On: 04/11/2021 13:37    Procedures .Critical Care Performed by: Hayden Rasmussen, MD Authorized by: Hayden Rasmussen, MD   Critical care provider statement:    Critical care time (minutes):  45   Critical care time was exclusive of:  Separately  billable procedures and treating other patients   Critical care was necessary to treat or prevent imminent or life-threatening deterioration of the following conditions:  CNS failure or compromise   Critical care was time spent personally by me on the following activities:  Development of treatment plan with patient or surrogate, discussions with consultants, evaluation of patient's response to treatment, examination of patient, obtaining history from patient or surrogate, ordering and performing treatments and interventions, ordering and review of laboratory studies, ordering and review of radiographic studies, pulse oximetry, re-evaluation of patient's condition and review of old charts   I assumed direction of critical care for this patient from another provider in my specialty: no      Medications Ordered in ED Medications  ezetimibe (ZETIA) tablet 10 mg (has no administration in time range)  FLUoxetine (PROZAC) capsule 40 mg (has no administration in time range)  senna-docusate (Senokot-S) tablet 1 tablet (has no administration in time range)  rOPINIRole (REQUIP) tablet 0.25-0.5 mg (has no administration in time range)  loratadine (CLARITIN) tablet 5 mg (has no administration in time range)  montelukast (SINGULAIR) tablet 10 mg (has no administration in time range)   stroke: mapping our early stages of recovery book (has no administration in time range)  acetaminophen (TYLENOL) tablet 650 mg (has no administration in time range)    Or  acetaminophen (TYLENOL) 160 MG/5ML solution 650 mg (has no administration in time range)    Or  acetaminophen (TYLENOL) suppository 650 mg (has no administration in time range)  enoxaparin (LOVENOX) injection 40 mg (has no administration in time range)  aspirin EC tablet 81 mg (has no administration in time range)  ticagrelor (BRILINTA) tablet 90 mg (has no administration in time range)  insulin aspart (novoLOG) injection 0-9 Units (has no administration in  time range)    ED Course/ Medical Decision Making/ A&P Clinical Course as of 04/11/21 1558  Thu Apr 11, 2021  1332 She was evaluated by neurology.  They are recommending an MRI brain.  If positive will need admission for further work-up.  If negative is already on maximum treatment.  Not a candidate for TNK due to having received less than 4 weeks ago already. [MB]    Clinical Course User Index [MB] Hayden Rasmussen, MD  Medical Decision Making Amount and/or Complexity of Data Reviewed Radiology: ordered.  Risk Decision regarding hospitalization.  This patient complains of acute onset of left facial numbness and left eye vision disturbance; this involves an extensive number of treatment Options and is a complaint that carries with it a high risk of complications and morbidity. The differential includes stroke, bleed, recrudescence, CRAO, vitreous hemorrhage  I ordered, reviewed and interpreted labs, which included CBC with mildly elevated white count, normal hemoglobin, chemistries unremarkable other than elevated glucose, INR normal, COVID and flu negative I ordered imaging studies which included CT head and MRI brain and I independently    visualized and interpreted imaging which showed acute stroke Additional history obtained from EMS Previous records obtained and reviewed in epic including prior admission last month for stroke which she received TNK I consulted neurology Dr. Rory Percy and and [facility] hospitalist Dr. Cathlean Sauer and discussed lab and imaging findings and discussed disposition.  Cardiac monitoring reviewed, normal sinus rhythm Social determinants considered, no significant barriers Critical Interventions: Consideration for TN K, with patient having received it less than a month ago she is not a candidate  After the interventions stated above, I reevaluated the patient and found to have improvement in her facial numbness although still having some  visual disturbance Admission and further testing considered, patient will need admission to the hospital for further evaluation of her acute stroke.  Patient in agreement with plan.          Final Clinical Impression(s) / ED Diagnoses Final diagnoses:  Decreased vision  Facial numbness  Cerebrovascular accident (CVA), unspecified mechanism ([place])    Rx / [place] Orders ED Discharge Orders     None         Hayden Rasmussen, MD 04/11/21 2027

## 2021-04-11 NOTE — Assessment & Plan Note (Addendum)
Continue fluoxetine

## 2021-04-12 ENCOUNTER — Inpatient Hospital Stay (HOSPITAL_COMMUNITY): Payer: 59

## 2021-04-12 DIAGNOSIS — E1151 Type 2 diabetes mellitus with diabetic peripheral angiopathy without gangrene: Secondary | ICD-10-CM | POA: Diagnosis not present

## 2021-04-12 DIAGNOSIS — E785 Hyperlipidemia, unspecified: Secondary | ICD-10-CM | POA: Diagnosis present

## 2021-04-12 DIAGNOSIS — R69 Illness, unspecified: Secondary | ICD-10-CM | POA: Diagnosis not present

## 2021-04-12 DIAGNOSIS — I69344 Monoplegia of lower limb following cerebral infarction affecting left non-dominant side: Secondary | ICD-10-CM | POA: Diagnosis not present

## 2021-04-12 DIAGNOSIS — E1169 Type 2 diabetes mellitus with other specified complication: Secondary | ICD-10-CM | POA: Diagnosis not present

## 2021-04-12 DIAGNOSIS — H547 Unspecified visual loss: Secondary | ICD-10-CM

## 2021-04-12 DIAGNOSIS — I63233 Cerebral infarction due to unspecified occlusion or stenosis of bilateral carotid arteries: Secondary | ICD-10-CM | POA: Diagnosis not present

## 2021-04-12 DIAGNOSIS — F418 Other specified anxiety disorders: Secondary | ICD-10-CM | POA: Diagnosis not present

## 2021-04-12 DIAGNOSIS — Z8673 Personal history of transient ischemic attack (TIA), and cerebral infarction without residual deficits: Secondary | ICD-10-CM | POA: Diagnosis not present

## 2021-04-12 DIAGNOSIS — F419 Anxiety disorder, unspecified: Secondary | ICD-10-CM | POA: Diagnosis present

## 2021-04-12 DIAGNOSIS — Z20822 Contact with and (suspected) exposure to covid-19: Secondary | ICD-10-CM | POA: Diagnosis not present

## 2021-04-12 DIAGNOSIS — H538 Other visual disturbances: Secondary | ICD-10-CM | POA: Diagnosis present

## 2021-04-12 DIAGNOSIS — E440 Moderate protein-calorie malnutrition: Secondary | ICD-10-CM | POA: Diagnosis not present

## 2021-04-12 DIAGNOSIS — F17200 Nicotine dependence, unspecified, uncomplicated: Secondary | ICD-10-CM | POA: Diagnosis not present

## 2021-04-12 DIAGNOSIS — I63313 Cerebral infarction due to thrombosis of bilateral middle cerebral arteries: Secondary | ICD-10-CM | POA: Diagnosis not present

## 2021-04-12 DIAGNOSIS — I10 Essential (primary) hypertension: Secondary | ICD-10-CM | POA: Diagnosis not present

## 2021-04-12 DIAGNOSIS — F32A Depression, unspecified: Secondary | ICD-10-CM | POA: Diagnosis present

## 2021-04-12 DIAGNOSIS — I6381 Other cerebral infarction due to occlusion or stenosis of small artery: Secondary | ICD-10-CM | POA: Diagnosis not present

## 2021-04-12 DIAGNOSIS — R29702 NIHSS score 2: Secondary | ICD-10-CM | POA: Diagnosis not present

## 2021-04-12 DIAGNOSIS — I672 Cerebral atherosclerosis: Secondary | ICD-10-CM | POA: Diagnosis not present

## 2021-04-12 DIAGNOSIS — I6623 Occlusion and stenosis of bilateral posterior cerebral arteries: Secondary | ICD-10-CM | POA: Diagnosis not present

## 2021-04-12 LAB — HOMOCYSTEINE: Homocysteine: 12.7 umol/L (ref 0.0–14.5)

## 2021-04-12 LAB — CARDIOLIPIN ANTIBODIES, IGG, IGM, IGA
Anticardiolipin IgA: <9 APL U/mL (ref 0–11)
Anticardiolipin IgG: <9 GPL U/mL (ref 0–14)
Anticardiolipin IgM: <9 MPL U/mL (ref 0–12)

## 2021-04-12 LAB — HEMOGLOBIN A1C
Hgb A1c MFr Bld: 6.2 % — ABNORMAL HIGH (ref 4.8–5.6)
Mean Plasma Glucose: 131.24 mg/dL

## 2021-04-12 LAB — RAPID URINE DRUG SCREEN, HOSP PERFORMED
Amphetamines: NOT DETECTED
Barbiturates: NOT DETECTED
Benzodiazepines: NOT DETECTED
Cocaine: NOT DETECTED
Opiates: NOT DETECTED
Tetrahydrocannabinol: NOT DETECTED

## 2021-04-12 LAB — URINALYSIS, ROUTINE W REFLEX MICROSCOPIC
Bacteria, UA: NONE SEEN
Bilirubin Urine: NEGATIVE
Glucose, UA: 50 mg/dL — AB
Hgb urine dipstick: NEGATIVE
Ketones, ur: NEGATIVE mg/dL
Leukocytes,Ua: NEGATIVE
Nitrite: NEGATIVE
Protein, ur: 30 mg/dL — AB
Specific Gravity, Urine: 1.019 (ref 1.005–1.030)
pH: 6 (ref 5.0–8.0)

## 2021-04-12 LAB — LUPUS ANTICOAGULANT PANEL
DRVVT: 41.6 s (ref 0.0–47.0)
PTT Lupus Anticoagulant: 31.7 s (ref 0.0–43.5)

## 2021-04-12 LAB — GLUCOSE, CAPILLARY
Glucose-Capillary: 140 mg/dL — ABNORMAL HIGH (ref 70–99)
Glucose-Capillary: 146 mg/dL — ABNORMAL HIGH (ref 70–99)
Glucose-Capillary: 134 mg/dL — ABNORMAL HIGH (ref 70–99)
Glucose-Capillary: 163 mg/dL — ABNORMAL HIGH (ref 70–99)

## 2021-04-12 LAB — PROTEIN C ACTIVITY: Protein C Activity: 131 % (ref 73–180)

## 2021-04-12 LAB — LIPID PANEL
Cholesterol: 89 mg/dL (ref 0–200)
HDL: 42 mg/dL (ref >40)
LDL Cholesterol: 25 mg/dL (ref 0–99)
Total CHOL/HDL Ratio: 2.1 RATIO
Triglycerides: 111 mg/dL (ref <150)
VLDL: 22 mg/dL (ref 0–40)

## 2021-04-12 LAB — BETA-2-GLYCOPROTEIN I ABS, IGG/M/A
Beta-2 Glyco I IgG: <9 GPI IgG units (ref 0–20)
Beta-2-Glycoprotein I IgA: <9 GPI IgA units (ref 0–25)
Beta-2-Glycoprotein I IgM: 9 GPI IgM units (ref 0–32)

## 2021-04-12 LAB — PROTEIN C, TOTAL: Protein C, Total: 113 % (ref 60–150)

## 2021-04-12 LAB — PROTEIN S, TOTAL: Protein S Ag, Total: 113 % (ref 60–150)

## 2021-04-12 LAB — PROTEIN S ACTIVITY: Protein S Activity: 69 % (ref 63–140)

## 2021-04-12 MED ORDER — ROSUVASTATIN CALCIUM 20 MG PO TABS
40.0000 mg | ORAL_TABLET | Freq: Every evening | ORAL | Status: DC
  Administered 2021-04-12: 40 mg via ORAL
  Filled 2021-04-12: qty 2

## 2021-04-12 MED ORDER — IOHEXOL 350 MG/ML SOLN
75.0000 mL | Freq: Once | INTRAVENOUS | Status: AC | PRN
  Administered 2021-04-12: 75 mL via INTRAVENOUS

## 2021-04-12 NOTE — Progress Notes (Addendum)
STROKE TEAM PROGRESS NOTE   ATTENDING NOTE: I reviewed above note and agree with the assessment and plan. Pt was seen and examined.   56 year old female with history of diabetes, hypertension, hyperlipidemia, migraine, smoker, PVD admitted for left face numbness, left eye blurry vision.  Continue to have residual left arm and leg weakness/numbness, unchanged.   Hx of stroke 03/21/2021 admitted for left sided weakness and numbness status post TNK.  CTA head and neck unremarkable.  MRI showed bilateral BG small infarcts.  EF 55%, LDL 162, A1c 6.1.  Discharged on DAPT, continued on Crestor, Zetia and fenofibrate.  Recommend 30-day CardioNet monitoring as outpatient Readmitted 03/25/2021 for left-sided weakness numbness and tingling.  MRI showed right BG/CR stroke extension from prior stroke.  Change aspirin Plavix to aspirin and Brilinta for 30 days and then back to aspirin and Plavix.  30-day cardiac event monitoring scheduled.  This time, patient had acute onset left facial numbness with left eye blurry vision.  On exam today patient still has left eye left visual field decreased visual acuity and mild residual left arm and leg 4+/5 strength.  CT no acute abnormality.  MRI showed new left temporal horn periventricular small white matter stroke.  LDL 25, A1c 6.2.  CTA head and neck no large vessel stenosis.  Hypercoagulable work-up pending.  UDS negative.  Patient admitted that she still intermittently smoking.  Patient symptoms on the left side not quite fit to the new stroke on MRI on the left side.  Concerning for recrudescence of previous stroke symptoms.  Left eye left visual field blurry vision but no left carotid stenosis on repeat CTA.  Etiology for patient recurrent small vessel disease not quite clear but likely due to risk factor not being good control.  Recommend quit smoking and BP monitoring at home.  Continue aspirin Brilinta to complete 30-day course and then back to aspirin and Plavix.   Continue statin.  Continue outpatient scheduled 30-day CardioNet monitoring.  Has [facility] follow-up with Milikan NP on 04/23/2021.  For detailed assessment and plan, please refer to above as I have made changes wherever appropriate.   Neurology will sign off. Please call with questions. Pt will follow up with Milikan NP at [facility] on 04/23/21. Thanks for the consult.   Rosalin Hawking, MD PhD Stroke Neurology 04/12/2021 6:00 PM    INTERVAL HISTORY Her husband is at the bedside.   Patient is able to recall events leading to event. She states that she called her husband and said "my face is numb and I can't get up" Left side of face numb, increased weakness on left side, left eye blurriness on left side of visual field. Did smoke 2 cigarettes in the last week. She has been compliant with PT/OT outpatient and home exercises. Denies missing any doses of medications.  Possible recrudesce of symptoms from previous right side infarct. Plan to finish course of brilinta, then switch to plavix and aspirin as originally planned. Repeat CTA head and neck ordered. Hypercoaguable work up pending   Vitals:   04/11/21 1816 04/11/21 2028 04/12/21 0015 04/12/21 0403  BP: 135/63 (!) 120/46 (!) 138/57 (!) 121/51  Pulse: 66 65 65 (!) 58  Resp: '16 17 17 16  '$ Temp: 98.2 F (36.8 C) 98 F (36.7 C) 98 F (36.7 C) 98.6 F (37 C)  TempSrc: Oral Oral Oral   SpO2: 97% 95% 97% 99%  Weight:      Height:       CBC:  Recent Labs  Lab 04/11/21 1311 04/11/21 1321 04/11/21 1822  WBC 12.0*  --  10.9*  NEUTROABS 7.3  --   --   HGB 14.1 13.3 12.7  HCT 39.6 39.0 35.6*  MCV 88.6  --  87.7  PLT 290  --  671   Basic Metabolic Panel:  Recent Labs  Lab 04/11/21 1311 04/11/21 1321 04/11/21 1822  NA 136 139  --   K 3.9 3.9  --   CL 104 102  --   CO2 25  --   --   GLUCOSE 172* 167*  --   BUN 12 13  --   CREATININE 0.97 0.90 0.97  CALCIUM 9.5  --   --    Lipid Panel:  Recent Labs  Lab 04/11/21 1835  CHOL 89  TRIG  111  HDL 42  CHOLHDL 2.1  VLDL 22  LDLCALC 25   HgbA1c:  Recent Labs  Lab 04/11/21 1835  HGBA1C 6.2*   Urine Drug Screen: No results for input(s): LABOPIA, COCAINSCRNUR, LABBENZ, AMPHETMU, THCU, LABBARB in the last 168 hours.  Alcohol Level  Recent Labs  Lab 04/11/21 1311  ETH <10    IMAGING past 24 hours MR BRAIN WO CONTRAST  Result Date: 04/11/2021 CLINICAL DATA:  Neuro deficit, acute, stroke suspected visual loss EXAM: MRI HEAD WITHOUT CONTRAST TECHNIQUE: Multiplanar, multiecho pulse sequences of the brain and surrounding structures were obtained without intravenous contrast. COMPARISON:  MRI March 25, 2021. FINDINGS: Brain: New/interval acute infarct in the LEFT temporal stem. Evolving infarct in the right posterior limb of the internal capsule and RIGHT temporal stem with decreasing edema and less avid restricted diffusion. Small remote right cerebellar infarcts. T2 hyperintensities elsewhere in the cerebral white matter bilaterally and in the pons are unchanged and are nonspecific but compatible with mild chronic microvascular ischemic disease. Unchanged moderate lateral and third ventriculomegaly. Normal cerebral sulci. No evidence of acute hemorrhage, mass lesion, midline shift. Vascular: Major arterial flow voids are maintained at the skull base. Skull and upper cervical spine: Normal marrow signal. Sinuses/Orbits: Clear sinuses.  Unremarkable orbits. Other: Small left mastoid effusion. IMPRESSION: 1. New/interval acute infarct in the LEFT temporal stem. 2. Evolving infarct in the right posterior limb of the internal capsule and RIGHT temporal stem. 3. Mild chronic microvascular ischemic disease and small remote cerebellar infarcts. 4. Unchanged ventriculomegaly, which is nonspecific but could represent normal pressure hydrocephalus in the correct clinical setting. Electronically Signed   By: Margaretha Sheffield M.D.   On: 04/11/2021 16:01   CT HEAD CODE STROKE WO CONTRAST  Result  Date: 04/11/2021 CLINICAL DATA:  Neuro deficit, acute, stroke suspected. Additional history provided: Left-sided weakness, left eye blurred vision. EXAM: CT HEAD WITHOUT CONTRAST TECHNIQUE: Contiguous axial images were obtained from the base of the skull through the vertex without intravenous contrast. RADIATION DOSE REDUCTION: This exam was performed according to the departmental dose-optimization program which includes automated exposure control, adjustment of the mA and/or kV according to patient size and/or use of iterative reconstruction technique. COMPARISON:  Brain MRI 03/25/2021.  Head CT 03/25/2021. FINDINGS: Brain: Moderate lateral and third ventriculomegaly, unchanged. Redemonstrated subacute infarct within the posterior limb of right internal capsule and right temporal stem. Mild patchy and ill-defined hypoattenuation elsewhere within the cerebral white matter, nonspecific but compatible chronic small vessel ischemic disease. Redemonstrated small chronic infarcts within the superior right cerebellar hemisphere. There is no acute intracranial hemorrhage. No acute demarcated cortical infarct. No extra-axial fluid collection. No evidence of an intracranial mass. No midline  shift. Vascular: No hyperdense vessel.  Atherosclerotic calcifications. Skull: Normal. Negative for fracture or focal lesion. Sinuses/Orbits: Visualized orbits show no acute finding. Small mucous retention cyst within a posterior left ethmoid air cell. ASPECTS ([place] Stroke Program Early CT Score) - Ganglionic level infarction (caudate, lentiform nuclei, internal capsule, insula, M1-M3 cortex): 7 - Supraganglionic infarction (M4-M6 cortex): 3 Total score (0-10 with 10 being normal): 10 (when discounting a subacute infarct within the posterior limb of right internal capsule and right temporal stem). These results were communicated to Dr. Rory Percy at 1:35 pmon 3/9/2023by text page via the [facility] messaging system. IMPRESSION: No evidence of  acute intracranial abnormality. Redemonstrated subacute infarct within the posterior limb of right internal capsule and right temporal stem. Background mild chronic small vessel ischemic changes within the cerebral white matter, stable from the head CT of 03/25/2021. Redemonstrated small chronic infarcts within the superior right cerebellar hemisphere. Unchanged moderate ventriculomegaly which is nonspecific, but could reflect sequela of normal pressure hydrocephalus in the appropriate clinical setting. Electronically Signed   By: Kellie Simmering D.O.   On: 04/11/2021 13:37    PHYSICAL EXAM  Physical Exam  Constitutional: Appears well-developed and well-nourished.  Cardiovascular: Normal rate and regular rhythm.  Respiratory: Effort normal, non-labored breathing  Neuro: Mental Status: Patient is awake, alert, oriented to person, place, month, year, and situation. Patient is able to give a clear and coherent history. No signs of aphasia or neglect Cranial Nerves: II: Pupils are equal, round, and reactive to light. Decreased visual acuity in left eye, reports blurry vision  III,IV, VI: EOMI without ptosis or diploplia.  V: Facial sensation is symmetric to temperature VII: Facial movement is symmetric resting and smiling VIII: Hearing is intact to voice X: Palate elevates symmetrically XI: Shoulder shrug is symmetric. XII: Tongue protrudes midline without atrophy or fasciculations.  Motor: Tone is normal. Bulk is normal. 5/5 strength in right upper and lower extremities Residual weakness in left upper and lower extremities.  LUE 4+/5 LLE 4/5 Sensory: Residual numbness in dorsal and palmar aspect of left hand.  Cerebellar: FNF and HKS are intact bilaterally   ASSESSMENT/PLAN Ms. KIMARIA STRUTHERS is a 56 y.o. female with history of  type 2 diabetes mellitus, essential hypertension, hyperlipidemia, migraine headaches, GERD, tobacco use, peripheral vascular disease on Plavix, and recent CVA  in February 2023 (2/18) s/p TNKase after presenting with sudden onset of left leg weakness and decreased sensation in the left arm and left leg. Plan for follow up outpatient for loop recorder per cardiology.   Stroke:  small left temporal horn periventricular WM infarct likely secondary small vessel disease with uncontrolled risk factors Code Stroke CT Head- Redemonstrated subacute infarct within the posterior limb of right internal capsule and right temporal stem. Background mild chronic small vessel ischemic changes within the cerebral white matter, stable from the head CT of 03/25/2021. Redemonstrated small chronic infarcts within the superior right cerebellar hemisphere. Repeat CTA head and neck-pending MRI  prior known right hemispheric small vessel looking strokes, she has an additional area of restricted diffusion indicating the new acute to subacute stroke by the left temporal horn 2D Echo LVEF 55% TEE 2/21-LVEF is 55-60%.  LV normal function, left atrial size mildly dilated, bubble study negative, no thrombus identified LDL 25 HgbA1c 6.2 VTE prophylaxis - Lovenox  aspirin 81 mg daily and Brilinta (ticagrelor) 90 mg bid prior to admission, now on aspirin 81 mg daily and Brilinta (ticagrelor) 90 mg bid.  Therapy recommendations:  Continue outpatient  PT with current provider Disposition:  pending  Hypertension Home meds:  Losartan '50mg'$  Stable Permissive hypertension (OK if < 220/120) but gradually normalize in 5-7 days Long-term BP goal normotensive  Hyperlipidemia Home meds:  Zetia '10mg'$ , Fenofibrate '135mg'$ , Repatha, Crestor '40mg'$  resumed in hospital LDL 25, goal < 70 High intensity statin not indicated  Continue statin at discharge  Diabetes type II Controlled Home meds:  Metformin HgbA1c 6.2, goal < 7.0 CBGs Recent Labs    04/11/21 1313 04/11/21 2206 04/12/21 0628  GLUCAP 182* 161* 140*    SSI  Other Stroke Risk Factors Cigarette smoker, advised to stop smoking ETOH  use, alcohol level <10, advised to drink no more than 1 drink(s) a day Hx stroke/TIA 2/16 CVA-  b/l BG small infarcts likely secondary to synchronized small vessel disease given multiple uncontrolled risk factors 2/20- MRI brain shows enlargement of acute infarct involving posterior limb of the right internal capsule and right temporal stem. Discharged on [place] for 30days and then ASA and Plavix Migraines Home Medication: Sumatriptan '100mg'$  Family hx stroke (Mother, Maternal grandmother) Arterial occlusive disease and claudication of RLE Continue ASA, brilinta, and statin  Other Active Problems   Hospital day # 1  Patient seen and examined by NP/APP with MD. MD to update note as needed.   Janine Ores, DNP, FNP-BC [facility] Pager: [phone]  To contact Stroke Continuity provider, please refer to [email or web address] After hours, contact General Neurology

## 2021-04-12 NOTE — Evaluation (Signed)
Occupational Therapy Evaluation ?Patient Details ?Name: Hannah Mcdaniel ?MRN: [id number] ?DOB: 1965/07/12 ?Today's Date: 04/12/2021 ? ? ?History of Present Illness Pt adm with acute onset of lt facial numbness and blurry vision. MRI revealed new acute to subacute stroke in lt temporal horn. PMH - Rt CVA's 03/2021, DMII, HLD, migraine, GERD, PVD.  ? ?Clinical Impression ?  ?Pt admitted for concerns listed above. PTA pt reported that she was mainly independent with all BADL's and functional mobility, using a RW. At this time, pt is near her functional baseline since previous CVA, she has been progressing with Out Patient therapy to maximize strength and independence. Recommending continued follow up with OP. Pt has no further acute OT needs and OT will sign off.   ?   ? ?Recommendations for follow up therapy are one component of a multi-disciplinary discharge planning process, led by the attending physician.  Recommendations may be updated based on patient status, additional functional criteria and insurance authorization.  ? ?Follow Up Recommendations ? Outpatient OT  ?  ?Assistance Recommended at Discharge Set up Supervision/Assistance  ?Patient can return home with the following A little help with bathing/dressing/bathroom;Assistance with cooking/housework ? ?  ?Functional Status Assessment ? Patient has had a recent decline in their functional status and demonstrates the ability to make significant improvements in function in a reasonable and predictable amount of time.  ?Equipment Recommendations ? None recommended by OT  ?  ?Recommendations for Other Services   ? ? ?  ?Precautions / Restrictions Precautions ?Precautions: Fall ?Restrictions ?Weight Bearing Restrictions: No  ? ?  ? ?Mobility Bed Mobility ?Overal bed mobility: Modified Independent ?  ?  ?  ?  ?  ?  ?  ?  ? ?Transfers ?Overall transfer level: Modified independent ?Equipment used: Rolling walker (2 wheels) ?Transfers: Sit to/from Stand ?Sit to Stand:  Modified independent (Device/Increase time) ?  ?  ?  ?  ?  ?General transfer comment: No assist needed from bed or toilet ?  ? ?  ?Balance Overall balance assessment: Needs assistance ?Sitting-balance support: No upper extremity supported ?Sitting balance-Leahy Scale: Good ?  ?  ?Standing balance support: No upper extremity supported ?Standing balance-Leahy Scale: Fair ?  ?  ?  ?  ?  ?  ?  ?  ?  ?  ?  ?  ?   ? ?ADL either performed or assessed with clinical judgement  ? ?ADL Overall ADL's : At baseline ?  ?  ?  ?  ?  ?  ?  ?  ?  ?  ?  ?  ?  ?  ?  ?  ?  ?  ?  ?General ADL Comments: Pt able to complete all ADL's safely, recommending to dress and bath in sitting, as well as continue using RW for safety  ? ? ? ?Vision Baseline Vision/History: 1 Wears glasses ?Ability to See in Adequate Light: 0 Adequate ?Patient Visual Report: No change from baseline ?Vision Assessment?: Vision impaired- to be further tested in functional context ?Additional Comments: Pt reported having increased blurriness in L eye yesterday, however today it has mostly cleared up. All tracking, peripheral, saccades, convergence, and functional acuity appear WFL at this time  ?   ?Perception   ?  ?Praxis   ?  ? ?Pertinent Vitals/Pain Pain Assessment ?Pain Assessment: No/denies pain  ? ? ? ?Hand Dominance Right ?  ?Extremity/Trunk Assessment Upper Extremity Assessment ?Upper Extremity Assessment: LUE deficits/detail ?LUE Deficits / Details: Mildly weaker thank  RUE, decreased sensation from prior CVA ?LUE Sensation: decreased light touch ?LUE Coordination: decreased fine motor ?  ?Lower Extremity Assessment ?Lower Extremity Assessment: Defer to PT evaluation ?  ?Cervical / Trunk Assessment ?Cervical / Trunk Assessment: Normal ?  ?Communication Communication ?Communication: No difficulties ?  ?Cognition Arousal/Alertness: Awake/alert ?Behavior During Therapy: [facility] for tasks assessed/performed ?Overall Cognitive Status: Within Functional Limits for tasks  assessed ?  ?  ?  ?  ?  ?  ?  ?  ?  ?  ?  ?  ?  ?  ?  ?  ?  ?  ?  ?General Comments  VSS on RA ? ?  ?Exercises   ?  ?Shoulder Instructions    ? ? ?Home Living Family/patient expects to be discharged to:: Private residence ?Living Arrangements: Spouse/significant other ?Available Help at Discharge: Family;Available 24 hours/day ?  ?Home Access: Ramped entrance ?  ?  ?Home Layout: One level ?  ?  ?Bathroom Shower/Tub: Walk-in shower ?  ?Bathroom Toilet: Standard ?Bathroom Accessibility: Yes ?  ?Home Equipment: Kasandra Knudsen - single point;Shower seat;BSC/3in1;Rolling Environmental consultant (2 wheels) ?  ?  ?  ? ?  ?Prior Functioning/Environment Prior Level of Function : Needs assist ?  ?  ?  ?  ?  ?  ?Mobility Comments: Using rolling walker at home modified independent. Working on using straight cane with outpt PT ?ADLs Comments: Reports independence ?  ? ?  ?  ?OT Problem List: Decreased safety awareness;Impaired UE functional use ?  ?   ?OT Treatment/Interventions:    ?  ?OT Goals(Current goals can be found in the care plan section) Acute Rehab OT Goals ?Patient Stated Goal: To get stronger ?OT Goal Formulation: With patient ?Time For Goal Achievement: 04/12/21 ?Potential to Achieve Goals: Good  ?OT Frequency:   ?  ? ?Co-evaluation   ?  ?  ?  ?  ? ?  ?AM-PAC OT "6 Clicks" Daily Activity     ?Outcome Measure Help from another person eating meals?: None ?Help from another person taking care of personal grooming?: None ?Help from another person toileting, which includes using toliet, bedpan, or urinal?: None ?Help from another person bathing (including washing, rinsing, drying)?: None ?Help from another person to put on and taking off regular upper body clothing?: None ?Help from another person to put on and taking off regular lower body clothing?: None ?6 Click Score: 24 ?  ?End of Session Equipment Utilized During Treatment: Rolling walker (2 wheels) ?Nurse Communication: Mobility status ? ?Activity Tolerance: Patient tolerated treatment  well ?Patient left: in bed;with call bell/phone within reach ? ?OT Visit Diagnosis: Muscle weakness (generalized) (M62.81);Other abnormalities of gait and mobility (R26.89)  ?              ?Time: 4656-8127 ?OT Time Calculation (min): 15 min ?Charges:  OT General Charges ?$OT Visit: 1 Visit ?OT Evaluation ?$OT Eval Moderate Complexity: 1 Mod ? ?Hannah Mcdaniel H., OTR/L ?Acute Rehabilitation ? ?Hannah Mcdaniel ?04/12/2021, 5:53 PM ?

## 2021-04-12 NOTE — Plan of Care (Signed)
?  Problem: Education: ?Goal: Knowledge of General Education information will improve ?Description: Including pain rating scale, medication(s)/side effects and non-pharmacologic comfort measures ?Outcome: Progressing ?  ?Problem: Health Behavior/Discharge Planning: ?Goal: Ability to manage health-related needs will improve ?Outcome: Progressing ?  ?Problem: Clinical Measurements: ?Goal: Ability to maintain clinical measurements within normal limits will improve ?Outcome: Progressing ?Goal: Will remain free from infection ?Outcome: Progressing ?Goal: Diagnostic test results will improve ?Outcome: Progressing ?Goal: Respiratory complications will improve ?Outcome: Progressing ?Goal: Cardiovascular complication will be avoided ?Outcome: Progressing ?  ?Problem: Activity: ?Goal: Risk for activity intolerance will decrease ?Outcome: Progressing ?  ?Problem: Nutrition: ?Goal: Adequate nutrition will be maintained ?Outcome: Progressing ?  ?Problem: Coping: ?Goal: Level of anxiety will decrease ?Outcome: Progressing ?  ?Problem: Elimination: ?Goal: Will not experience complications related to bowel motility ?Outcome: Progressing ?Goal: Will not experience complications related to urinary retention ?Outcome: Progressing ?  ?Problem: Pain Managment: ?Goal: General experience of comfort will improve ?Outcome: Progressing ?  ?Problem: Safety: ?Goal: Ability to remain free from injury will improve ?Outcome: Progressing ?  ?Problem: Skin Integrity: ?Goal: Risk for impaired skin integrity will decrease ?Outcome: Progressing ?  ?Problem: Education: ?Goal: Knowledge of disease or condition will improve ?Outcome: Progressing ?Goal: Knowledge of secondary prevention will improve (SELECT ALL) ?Outcome: Progressing ?Goal: Knowledge of patient specific risk factors will improve (INDIVIDUALIZE FOR PATIENT) ?Outcome: Progressing ?Goal: Individualized Educational Video(s) ?Outcome: Progressing ?  ?Problem: Coping: ?Goal: Will verbalize  positive feelings about self ?Outcome: Progressing ?Goal: Will identify appropriate support needs ?Outcome: Progressing ?  ?Problem: Health Behavior/Discharge Planning: ?Goal: Ability to manage health-related needs will improve ?Outcome: Progressing ?  ?Problem: Self-Care: ?Goal: Ability to participate in self-care as condition permits will improve ?Outcome: Progressing ?  ?Problem: Ischemic Stroke/TIA Tissue Perfusion: ?Goal: Complications of ischemic stroke/TIA will be minimized ?Outcome: Progressing ?  ?

## 2021-04-12 NOTE — Hospital Course (Signed)
Patient is a 56 year old female with history of CVA, peripheral vascular disease, type 2 diabetes mellitus, dyslipidemia presented with acute onset of left facial numbness and radiation.  Onset of symptoms around 11:15 AM on the day of admission, no triggering factors.  Symptoms have been improving by the time she arrived to ED. ?Recent hospitalization for acute CVA on 02/20 to 03/27/2021, and prior to that another hospitalization for CVA on 02/16 to 03/23/2021. Patient was discharged on dual antiplatelet therapy with aspirin and ticagrelor for 30 days and the continue with aspirin and clopidogrel. Out patient follow up for a loop monitor per cardiology.  ?She had residual left sided weakness at the time of her discharge ? ?Patient was admitted for further work-up ?

## 2021-04-12 NOTE — Plan of Care (Signed)
?  Problem: Education: ?Goal: Knowledge of General Education information will improve ?Description: Including pain rating scale, medication(s)/side effects and non-pharmacologic comfort measures ?Outcome: Progressing ?  ?Problem: Safety: ?Goal: Ability to remain free from injury will improve ?04/12/2021 0358 by Nancie Neas, RN ?Outcome: Progressing ?04/12/2021 0356 by Nancie Neas, RN ?Outcome: Progressing ?  ?Problem: Education: ?Goal: Knowledge of patient specific risk factors will improve (INDIVIDUALIZE FOR PATIENT) ?Outcome: Progressing ?  ?Problem: Ischemic Stroke/TIA Tissue Perfusion: ?Goal: Complications of ischemic stroke/TIA will be minimized ?Outcome: Progressing ?  ?

## 2021-04-12 NOTE — Progress Notes (Signed)
Initial Nutrition Assessment ? ?DOCUMENTATION CODES:  ?Non-severe (moderate) malnutrition in context of chronic illness ? ?INTERVENTION:  ?-carnation instant breakfast TID w/ meals  ? ?NUTRITION DIAGNOSIS:  ?Moderate Malnutrition related to chronic illness as evidenced by mild muscle depletion, mild fat depletion. ? ?GOAL:  ?Patient will meet greater than or equal to 90% of their needs ? ?MONITOR:  ?PO intake, Supplement acceptance, Labs, Weight trends, I & O's ? ?REASON FOR ASSESSMENT:  ?Malnutrition Screening Tool ?  ? ?ASSESSMENT:  ?Pt with PMH significant for CVA, PVD, type 2 DM, dyslipidemia, and depression admitted with CVA ? ?Pt reports appetite is good and denies any recent changes. Reports eating 2 balanced meals per day. Weight history reviewed. No significant weight changes observed; however, pt reports ~20lb wt loss x1 month, which is significant for time frame. Pt unsure of the cause of her weight loss but is agreeable to ONS to help improve nutrition status. Pt does not like Ensure but is awilling to try carnation instant breakfast to provide additional calories/protein.  ? ?PO Intake: none documented  ? ?Medications: SSI TID w/ meals ?Labs: ?Recent Labs  ?Lab 04/11/21 ?1311 04/11/21 ?1321 04/11/21 ?1822  ?NA 136 139  --   ?K 3.9 3.9  --   ?CL 104 102  --   ?CO2 25  --   --   ?BUN 12 13  --   ?CREATININE 0.97 0.90 0.97  ?CALCIUM 9.5  --   --   ?GLUCOSE 172* 167*  --   ?CBGs: 140-161 x24 hours ?  ?NUTRITION - FOCUSED PHYSICAL EXAM: ?Flowsheet Row Most Recent Value  ?Orbital Region No depletion  ?Upper Arm Region Mild depletion  ?Thoracic and Lumbar Region Mild depletion  ?Buccal Region Mild depletion  ?Temple Region Mild depletion  ?Clavicle Bone Region Mild depletion  ?Clavicle and Acromion Bone Region Mild depletion  ?Scapular Bone Region Mild depletion  ?Dorsal Hand Mild depletion  ?Patellar Region Moderate depletion  ?Anterior Thigh Region Moderate depletion  ?Posterior Calf Region Moderate  depletion  ?Edema (RD Assessment) None  ?Hair Reviewed  ?Eyes Reviewed  ?Mouth Other (Comment)  [missing teeth,  dental carries]  ?Skin Reviewed  ?Nails Reviewed  ? ?Diet Order:   ?Diet Order   ? ?       ?  Diet Carb Modified Fluid consistency: Thin; Room service appropriate? Yes  Diet effective now       ?  ? ?  ?  ? ?  ? ?EDUCATION NEEDS:  ?No education needs have been identified at this time ? ?Skin:  Skin Assessment: Reviewed RN Assessment ? ?Last BM:  PTA ? ?Height:  ?Ht Readings from Last 1 Encounters:  ?04/11/21 '5\' 1"'$  (1.549 m)  ? ?Weight:  ?Wt Readings from Last 1 Encounters:  ?04/11/21 61.2 kg  ? ?BMI:  Body mass index is 25.49 kg/m?. ? ?Estimated Nutritional Needs:  ?Kcal:  1550-1750 ?Protein:  75-90 grams ?Fluid:  >1.55L ? ? ? ?Theone Stanley., MS, RD, LDN (she/her/hers) ?RD pager number and weekend/on-call pager number located in [employer]. ?

## 2021-04-12 NOTE — Progress Notes (Signed)
? ? ? [facility] ?                                                                            ? ? ?Hannah Mcdaniel, is a 56 y.o. female, DOB - 04-20-1965, EVO:[id number] ?Admit date - 04/11/2021    ?Outpatient Primary MD for the patient is [name], FNP ? ?LOS - 1  days ? ? ? ?Brief summary  ? ?Patient is a 56 year old female with history of CVA, peripheral vascular disease, type 2 diabetes mellitus, dyslipidemia presented with acute onset of left facial numbness and radiation.  Onset of symptoms around 11:15 AM on the day of admission, no triggering factors.  Symptoms have been improving by the time she arrived to ED. ?Recent hospitalization for acute CVA on 02/20 to 03/27/2021, and prior to that another hospitalization for CVA on 02/16 to 03/23/2021. Patient was discharged on dual antiplatelet therapy with aspirin and ticagrelor for 30 days and the continue with aspirin and clopidogrel. Out patient follow up for a loop monitor per cardiology.  ?She had residual left sided weakness at the time of her discharge ? ?Patient was admitted for further work-up ? ? ?Assessment & Plan  ? ? ?Assessment and Plan: ?* CVA (cerebral vascular accident) ([place]) ?- Patient with recurrent CVA, this is the 3rd episode within last 30 days. ?-Neurology consulted, hypercoagulable work-up in progress ?-Continue neurochecks, aspirin, Brilinta, Zetia, Crestor ?- d/w Dr Erlinda Hong, will follow-up on CT angio head and neck ? ?DM type 2 with diabetic dyslipidemia ([place]) ?- Continue statin therapy ?-Hemoglobin A1c 6.2, continue sliding scale insulin ? ?Essential hypertension ?- BP currently stable, softer, hold antihypertensives ?-Continue with permissive hypertension.  ? ?Depression with anxiety ?-Continue fluoxetine ? ?PAD (peripheral artery disease) ([place]) ?- Continue aspirin, Brilinta, statin ? ? ?Code Status: Full code ?DVT Prophylaxis:  enoxaparin (LOVENOX) injection 40 mg Start: 04/11/21 1800 ?SCD's Start: 04/11/21 1746 ? ? ?Level of  Care: Level of care: Telemetry Medical ?Family Communication: Updated patient's family member who were at the bedside ? ?Disposition Plan:     Remains inpatient appropriate:   ? ?Procedures:  ?None ? ?Consultants:   ? ?Neurology ?Antimicrobials:  ? ?Anti-infectives (From admission, onward)  ? ? None  ? ?  ? ? ? ?Medications ? ? aspirin EC  81 mg Oral Daily  ? enoxaparin (LOVENOX) injection  40 mg Subcutaneous Q24H  ? ezetimibe  10 mg Oral Daily  ? FLUoxetine  40 mg Oral Daily  ? insulin aspart  0-9 Units Subcutaneous TID WC  ? loratadine  5 mg Oral QPM  ? montelukast  10 mg Oral QHS  ? rOPINIRole  0.25-0.5 mg Oral QHS  ? rosuvastatin  40 mg Oral QPM  ? ticagrelor  90 mg Oral BID  ? ? ? ?Subjective:  ? ?Hannah Mcdaniel was seen and examined today.  Alert and oriented, feels her symptoms are improving.  Patient denies dizziness, chest pain, shortness of breath, abdominal pain, N/V. ? ?Objective:  ? ?Vitals:  ? 04/12/21 0015 04/12/21 0403 04/12/21 0801 04/12/21 1201  ?BP: (!) 138/57 (!) 121/51 (!) 118/56 118/71  ?Pulse: 65 (!) 58 (!) 55 63  ?Resp: '17 16 18 16  '$ ?  Temp: 98 ?F (36.7 ?C) 98.6 ?F (37 ?C) 98.3 ?F (36.8 ?C) 98.3 ?F (36.8 ?C)  ?TempSrc: Oral  Oral Oral  ?SpO2: 97% 99% 99% 99%  ?Weight:      ?Height:      ? ?No intake or output data in the 24 hours ending 04/12/21 1242 ?Filed Weights  ? 04/11/21 1336  ?Weight: 61.2 kg  ? ? ? ?Exam ?General: Alert and oriented x 3, NAD ?Cardiovascular: S1 S2 auscultated, no murmurs, RRR ?Respiratory: Clear to auscultation bilaterally, no wheezing, rales or rhonchi ?Gastrointestinal: Soft, nontender, nondistended, + bowel sounds ?Ext: no pedal edema bilaterally ?Neuro: Strength 5/5 upper and lower extremities bilaterally ?Psych: Normal affect and demeanor, alert and oriented x3  ? ? ?Data Reviewed:  I have personally reviewed following labs  ? ? ?CBC ?Lab Results  ?Component Value Date  ? WBC 10.9 (H) 04/11/2021  ? RBC 4.06 04/11/2021  ? HGB 12.7 04/11/2021  ? HCT 35.6 (L)  04/11/2021  ? MCV 87.7 04/11/2021  ? MCH 31.3 04/11/2021  ? PLT 281 04/11/2021  ? MCHC 35.7 04/11/2021  ? RDW 11.7 04/11/2021  ? LYMPHSABS 4.0 04/11/2021  ? MONOABS 0.5 04/11/2021  ? EOSABS 0.1 04/11/2021  ? BASOSABS 0.1 04/11/2021  ? ? ? ?Last metabolic panel ?Lab Results  ?Component Value Date  ? NA 139 04/11/2021  ? K 3.9 04/11/2021  ? CL 102 04/11/2021  ? CO2 25 04/11/2021  ? BUN 13 04/11/2021  ? CREATININE 0.97 04/11/2021  ? GLUCOSE 167 (H) 04/11/2021  ? GFRNONAA >60 04/11/2021  ? GFRAA 112 06/29/2019  ? CALCIUM 9.5 04/11/2021  ? PROT 6.8 04/11/2021  ? ALBUMIN 3.8 04/11/2021  ? LABGLOB 2.4 04/03/2021  ? AGRATIO 2.0 04/03/2021  ? BILITOT 0.5 04/11/2021  ? ALKPHOS 91 04/11/2021  ? AST 26 04/11/2021  ? ALT 23 04/11/2021  ? ANIONGAP 7 04/11/2021  ? ? ?CBG (last 3)  ?Recent Labs  ?  04/11/21 ?2206 04/12/21 ?0628 04/12/21 ?1203  ?GLUCAP 161* 140* 146*  ?  ? ? ?Coagulation Profile: ?Recent Labs  ?Lab 04/11/21 ?1311  ?INR 1.0  ? ? ? ?Radiology Studies: I have personally reviewed the imaging studies  ?MR BRAIN WO CONTRAST ? ?Result Date: 04/11/2021 ?IMPRESSION: 1. New/interval acute infarct in the LEFT temporal stem. 2. Evolving infarct in the right posterior limb of the internal capsule and RIGHT temporal stem. 3. Mild chronic microvascular ischemic disease and small remote cerebellar infarcts. 4. Unchanged ventriculomegaly, which is nonspecific but could represent normal pressure hydrocephalus in the correct clinical setting. Electronically Signed   By: Margaretha Sheffield M.D.   On: 04/11/2021 16:01  ? ?CT HEAD CODE STROKE WO CONTRAST ? ?Result Date: 04/11/2021 ?IMPRESSION: No evidence of acute intracranial abnormality. Redemonstrated subacute infarct within the posterior limb of right internal capsule and right temporal stem. Background mild chronic small vessel ischemic changes within the cerebral white matter, stable from the head CT of 03/25/2021. Redemonstrated small chronic infarcts within the superior right  cerebellar hemisphere. Unchanged moderate ventriculomegaly which is nonspecific, but could reflect sequela of normal pressure hydrocephalus in the appropriate clinical setting. Electronically Signed   By: Kellie Simmering D.O.   On: 04/11/2021 13:37   ? ? ? ? ?Estill Cotta M.D. ?[facility] ?04/12/2021, 12:42 PM ? ?Available via Epic secure chat 7am-7pm ?After 7 pm, please refer to night coverage provider listed on amion. ? ?  ?

## 2021-04-12 NOTE — Evaluation (Signed)
Physical Therapy Evaluation ?Patient Details ?Name: Hannah Mcdaniel ?MRN: [id number] ?DOB: 05-Dec-1965 ?Today's Date: 04/12/2021 ? ?History of Present Illness ? Pt adm with acute onset of lt facial numbness and blurry vision. MRI revealed new acute to subacute stroke in lt temporal horn. PMH - Rt CVA's 03/2021, DMII, HLD, migraine, GERD, PVD.  ?Clinical Impression ? Pt presents to PT close to her recent baseline after her recent CVA's. Pt has been progressing with OPPT and recommend she return to outpatient at [facility]. From PT standpoint she can return home with family when medically ready.    ?   ? ?Recommendations for follow up therapy are one component of a multi-disciplinary discharge planning process, led by the attending physician.  Recommendations may be updated based on patient status, additional functional criteria and insurance authorization. ? ?Follow Up Recommendations Outpatient PT (Resume with current provider) ? ?  ?Assistance Recommended at Discharge Intermittent Supervision/Assistance  ?Patient can return home with the following ? Help with stairs or ramp for entrance ? ?  ?Equipment Recommendations None recommended by PT  ?Recommendations for Other Services ?    ?  ?Functional Status Assessment Patient has had a recent decline in their functional status and demonstrates the ability to make significant improvements in function in a reasonable and predictable amount of time.  ? ?  ?Precautions / Restrictions Precautions ?Precautions: Fall  ? ?  ? ?Mobility ? Bed Mobility ?Overal bed mobility: Modified Independent ?  ?  ?  ?  ?  ?  ?  ?  ? ?Transfers ?Overall transfer level: Needs assistance ?Equipment used: Rolling walker (2 wheels) ?Transfers: Sit to/from Stand ?Sit to Stand: Supervision ?  ?  ?  ?  ?  ?General transfer comment: supervision for safety ?  ? ?Ambulation/Gait ?Ambulation/Gait assistance: Supervision ?Gait Distance (Feet): 200 Feet ?Assistive device: Rolling walker (2 wheels) ?Gait  Pattern/deviations: Decreased dorsiflexion - left, Step-through pattern ?Gait velocity: decr ?Gait velocity interpretation: <1.31 ft/sec, indicative of household ambulator ?  ?General Gait Details: Assist for safety. Pt with lt swing phase with some abduction and pt with conscious effort to place lt foot flat. Decr clearance of lt foot as distance increases and fatigue ? ?Stairs ?  ?  ?  ?  ?  ? ?Wheelchair Mobility ?  ? ?Modified Rankin (Stroke Patients Only) ?Modified Rankin (Stroke Patients Only) ?Pre-Morbid Rankin Score: Moderate disability ?Modified Rankin: Moderately severe disability ? ?  ? ?Balance Overall balance assessment: Needs assistance ?Sitting-balance support: No upper extremity supported ?Sitting balance-Leahy Scale: Good ?  ?  ?Standing balance support: No upper extremity supported ?Standing balance-Leahy Scale: Fair ?  ?  ?  ?  ?  ?  ?  ?  ?  ?  ?  ?  ?   ? ? ? ?Pertinent Vitals/Pain Pain Assessment ?Pain Assessment: No/denies pain  ? ? ?Home Living Family/patient expects to be discharged to:: Private residence ?Living Arrangements: Spouse/significant other ?Available Help at Discharge: Family;Available 24 hours/day ?  ?Home Access: Ramped entrance ?  ?  ?  ?Home Layout: One level ?Home Equipment: Kasandra Knudsen - single point;Shower seat;BSC/3in1;Rolling Environmental consultant (2 wheels) ?   ?  ?Prior Function Prior Level of Function : Needs assist ?  ?  ?  ?  ?  ?  ?Mobility Comments: Using rolling walker at home modified independent. Working on using straight cane with outpt PT ?  ?  ? ? ?Hand Dominance  ? Dominant Hand: Right ? ?  ?Extremity/Trunk Assessment  ?  Upper Extremity Assessment ?Upper Extremity Assessment: Defer to OT evaluation ?  ? ?Lower Extremity Assessment ?Lower Extremity Assessment: LLE deficits/detail ?LLE Deficits / Details: Functionally 4+/5 with more weakness with fatigue. Decreased ankle control ?  ? ?   ?Communication  ? Communication: No difficulties  ?Cognition Arousal/Alertness:  Awake/alert ?Behavior During Therapy: [facility] for tasks assessed/performed ?Overall Cognitive Status: Within Functional Limits for tasks assessed ?  ?  ?  ?  ?  ?  ?  ?  ?  ?  ?  ?  ?  ?  ?  ?  ?  ?  ?  ? ?  ?General Comments   ? ?  ?Exercises    ? ?Assessment/Plan  ?  ?PT Assessment Patient needs continued PT services  ?PT Problem List Decreased strength;Decreased balance;Decreased mobility ? ?   ?  ?PT Treatment Interventions Gait training;DME instruction;Stair training;Balance training;Neuromuscular re-education;Patient/family education;Therapeutic activities;Functional mobility training;Therapeutic exercise   ? ?PT Goals (Current goals can be found in the Care Plan section)  ?Acute Rehab PT Goals ?Patient Stated Goal: go home ?PT Goal Formulation: With patient ?Time For Goal Achievement: 04/19/21 ?Potential to Achieve Goals: Good ? ?  ?Frequency Min 3X/week ?  ? ? ?Co-evaluation   ?  ?  ?  ?  ? ? ?  ?AM-PAC PT "6 Clicks" Mobility  ?Outcome Measure Help needed turning from your back to your side while in a flat bed without using bedrails?: None ?Help needed moving from lying on your back to sitting on the side of a flat bed without using bedrails?: None ?Help needed moving to and from a bed to a chair (including a wheelchair)?: A Little ?Help needed standing up from a chair using your arms (e.g., wheelchair or bedside chair)?: A Little ?Help needed to walk in hospital room?: A Little ?Help needed climbing 3-5 steps with a railing? : A Little ?6 Click Score: 20 ? ?  ?End of Session Equipment Utilized During Treatment: Gait belt ?Activity Tolerance: Patient tolerated treatment well ?Patient left: in chair;with call bell/phone within reach;with chair alarm set ?Nurse Communication: Mobility status ?PT Visit Diagnosis: Other abnormalities of gait and mobility (R26.89) ?  ? ?Time: 5631-4970 ?PT Time Calculation (min) (ACUTE ONLY): 17 min ? ? ?Charges:   PT Evaluation ?$PT Eval Low Complexity: 1 Low ?  ?  ?   ? ? ?[facility] PT ?Acute Rehabilitation Services ?Pager [phone] ?Office [phone] ? ? ?Shary Decamp [facility] ?04/12/2021, 1:39 PM ? ?

## 2021-04-12 NOTE — Plan of Care (Signed)
  Problem: Education: Goal: Knowledge of General Education information will improve Description: Including pain rating scale, medication(s)/side effects and non-pharmacologic comfort measures Outcome: Progressing   Problem: Safety: Goal: Ability to remain free from injury will improve Outcome: Progressing   

## 2021-04-13 DIAGNOSIS — E440 Moderate protein-calorie malnutrition: Secondary | ICD-10-CM | POA: Insufficient documentation

## 2021-04-13 DIAGNOSIS — H547 Unspecified visual loss: Secondary | ICD-10-CM | POA: Diagnosis not present

## 2021-04-13 LAB — GLUCOSE, CAPILLARY
Glucose-Capillary: 145 mg/dL — ABNORMAL HIGH (ref 70–99)
Glucose-Capillary: 184 mg/dL — ABNORMAL HIGH (ref 70–99)

## 2021-04-13 MED ORDER — CLOPIDOGREL BISULFATE 75 MG PO TABS: 75.0000 mg | ORAL_TABLET | Freq: Every day | ORAL | 11 refills | Status: DC

## 2021-04-13 NOTE — Discharge Summary (Signed)
Physician Discharge Summary   Patient: Hannah Mcdaniel MRN: [id number] DOB: 01/20/1966  Admit date:     04/11/2021  Discharge date: 04/13/21  Discharge Physician: Estill Cotta, MD   PCP: [name], FNP   Recommendations at discharge:   Per neurology, recommended aspirin and Brilinta to complete 30-day course and then back to aspirin and Plavix Continue statin Please follow hypercoagulable work-up  Discharge Diagnoses:    CVA (cerebral vascular accident) ([place])    DM type 2 with diabetic dyslipidemia ([place])   Essential hypertension   PAD (peripheral artery disease) ([place])   Depression with anxiety   Malnutrition of moderate degree   Hospital Course: Patient is a 56 year old female with history of CVA, peripheral vascular disease, type 2 diabetes mellitus, dyslipidemia presented with acute onset of left facial numbness and radiation.  Onset of symptoms around 11:15 AM on the day of admission, no triggering factors.  Symptoms have been improving by the time she arrived to ED. Recent hospitalization for acute CVA on 02/20 to 03/27/2021, and prior to that another hospitalization for CVA on 02/16 to 03/23/2021. Patient was discharged on dual antiplatelet therapy with aspirin and ticagrelor for 30 days and the continue with aspirin and clopidogrel. Out patient follow up for a loop monitor per cardiology.  She had residual left sided weakness at the time of her discharge  Patient was admitted for further work-up  Assessment and Plan: * CVA (cerebral vascular accident) [facility]) - Patient with recurrent CVA, this is the 3rd episode within last 30 days. -Neurology/stroke service was consulted -CT angiogram head and neck showed no large vessel stenosis -UDS negative, LDL 25, A1c 6.2 -Patient had recent stroke work-up completed from previous admission -Neurology recommended continue aspirin and Brilinta to complete 30-day course and went back to aspirin and Plavix -Continue statin, has  outpatient follow-up scheduled with neurology on 04/23/2021. -Continue 30-day CardioNet monitoring -PT OT evaluation was done and patient will resume PT -Cleared by neurology to be discharged home  DM type 2 with diabetic dyslipidemia ([place]) - Continue statin therapy -Hemoglobin A1c 6.2, patient was placed on sliding scale insulin while inpatient  Essential hypertension - BP stable resume antihypertensives  Depression with anxiety -Continue fluoxetine  PAD (peripheral artery disease) ([place]) - Continue aspirin, Brilinta, statin        Pain control - [employer] Controlled Substance Reporting System database was reviewed. and patient was instructed, not to drive, operate heavy machinery, perform activities at heights, swimming or participation in water activities or provide baby-sitting services while on Pain, Sleep and Anxiety Medications; until their outpatient Physician has advised to do so again. Also recommended to not to take more than prescribed Pain, Sleep and Anxiety Medications.  Consultants: Neurology Procedures performed: CT angiogram head and neck Disposition: Home Diet recommendation:  Discharge Diet Orders (From admission, onward)     Start     Ordered   04/13/21 0000  Diet Carb Modified        04/13/21 1137           Carb modified diet DISCHARGE MEDICATION: Allergies as of 04/13/2021       Reactions   Asa [aspirin] Rash   Can take low-dose aspirin.   Bempedoic Acid Other (See Comments)   Bilateral shoulder pain.   Codeine Rash   Penicillins Rash   Has patient had a PCN reaction causing immediate rash, facial/tongue/throat swelling, SOB or lightheadedness with hypotension:Yes Has patient had a PCN reaction causing severe rash involving mucus  membranes or skin necrosis:Yes Has patient had a PCN reaction that required hospitalization:No Has patient had a PCN reaction occurring within the last 10 years:No If all of the above answers are "NO", then may  proceed with Cephalosporin use.        Medication List     TAKE these medications    aspirin EC 81 MG tablet Take 81 mg by mouth daily.   clopidogrel 75 MG tablet Commonly known as: Plavix Take 1 tablet (75 mg total) by mouth daily. HOLD WHILE ON BRILINTA Start taking on: April 26, 2021 What changed:  additional instructions These instructions start on April 26, 2021. If you are unsure what to do until then, ask your doctor or other care provider.   ezetimibe 10 MG tablet Commonly known as: ZETIA Take 1 tablet (10 mg total) by mouth daily.   FLUoxetine 40 MG capsule Commonly known as: PROZAC Take 1 capsule (40 mg total) by mouth daily.   FreeStyle Libre 2 Reader Devi 1 Device by Does not apply route continuous.   FreeStyle Libre 2 Sensor Misc 1 Device by Does not apply route every 14 (fourteen) days.   levocetirizine 5 MG tablet Commonly known as: XYZAL TAKE 1 TABLET BY MOUTH EVERY DAY IN THE EVENING What changed:  how much to take how to take this when to take this additional instructions   losartan 50 MG tablet Commonly known as: COZAAR Take 1 tablet (50 mg total) by mouth daily.   metFORMIN 500 MG tablet Commonly known as: GLUCOPHAGE TAKE 2 TABLETS DAILY WITH BREAKFAST AND 1 TABLET DAILY WITH SUPPER. What changed:  how much to take how to take this when to take this additional instructions   montelukast 10 MG tablet Commonly known as: SINGULAIR Take 1 tablet (10 mg total) by mouth at bedtime.   Repatha SureClick 073 MG/ML Soaj Generic drug: Evolocumab Inject 140 mg into the skin every 14 (fourteen) days.   rOPINIRole 0.25 MG tablet Commonly known as: Requip Take 1-2 tablets (0.25-0.5 mg total) by mouth at bedtime. What changed: how much to take   rosuvastatin 40 MG tablet Commonly known as: CRESTOR Take 1 tablet (40 mg total) by mouth daily. What changed: when to take this   senna-docusate 8.6-50 MG tablet Commonly known as:  Senokot-S Take 1 tablet by mouth at bedtime as needed for mild constipation.   ticagrelor 90 MG Tabs tablet Commonly known as: BRILINTA Take 1 tablet (90 mg total) by mouth 2 (two) times daily.        Follow-up Information     Ward Givens, NP. Go on 04/23/2021.   Specialty: Neurology Why: at 10:00 am Contact information: [place] [address] [phone]         [name], [place]. Schedule an appointment as soon as possible for a visit in 2 week(s).   Specialty: Family Medicine Contact information: [place] [address] [phone]                Discharge Exam: Danley Danker Weights   04/11/21 1336  Weight: 61.2 kg   S: No acute complaints, hoping to go home today  Vitals:   04/12/21 2330 04/13/21 0352 04/13/21 0822 04/13/21 1225  BP: (!) 144/61 (!) 153/79 135/79 130/66  Pulse: (!) 59 (!) 56 80 68  Resp: '17 16 18 18  '$ Temp: 97.8 F (36.6 C) 97.7 F (36.5 C) 98.1 F (36.7 C) 98.1 F (36.7 C)  TempSrc:  Axillary Axillary Oral Oral  SpO2: 97% 98% 99% 98%  Weight:      Height:        Physical Exam General: Alert and oriented x 3, NAD Cardiovascular: S1 S2 clear, RRR. No pedal edema b/l Respiratory: CTAB, no wheezing, rales or rhonchi Gastrointestinal: Soft, nontender, nondistended, NBS Ext: no pedal edema bilaterally Neuro: no new deficits   Condition at discharge: good  The results of significant diagnostics from this hospitalization (including imaging, microbiology, ancillary and laboratory) are listed below for reference.   Imaging Studies: CT ANGIO HEAD NECK W WO CM  Result Date: 04/12/2021 CLINICAL DATA:  Neuro deficit, acute, stroke suspected EXAM: CT ANGIOGRAPHY HEAD AND NECK TECHNIQUE: Multidetector CT imaging of the head and neck was performed using the standard protocol during bolus administration of intravenous contrast. Multiplanar CT image reconstructions and MIPs were obtained to evaluate the  vascular anatomy. Carotid stenosis measurements (when applicable) are obtained utilizing NASCET criteria, using the distal internal carotid diameter as the denominator. RADIATION DOSE REDUCTION: This exam was performed according to the departmental dose-optimization program which includes automated exposure control, adjustment of the mA and/or kV according to patient size and/or use of iterative reconstruction technique. CONTRAST:  73m OMNIPAQUE IOHEXOL 350 MG/ML SOLN COMPARISON:  CT and MRI April 11, 2021.  CTA March 21, 2021. FINDINGS: CT HEAD FINDINGS Brain: Evolving infarcts in the right posterior limb of the internal capsule and right temporal horn. Acute infarct in the left temporal stem better characterized on recent MRI. Remote infarct in the right cerebellum. No evidence of acute hemorrhage, mass lesion, midline shift, extra-axial fluid collection. Similar ventriculomegaly, which could relate to normal pressure hydrocephalus in the correct clinical setting. Vascular: See below. Skull: No acute fracture. Sinuses: Clear visualized sinuses. Orbits: No acute finding. Review of the MIP images confirms the above findings CTA NECK FINDINGS Aortic arch: Great vessel origins are patent. Atherosclerosis of the aorta and great vessel origins. Right carotid system: Atherosclerosis at the carotid bifurcation without greater than 50% stenosis. Left carotid system: Atherosclerosis of the common carotid artery and carotid bifurcation without greater than 50% stenosis. Vertebral arteries: Left dominant. No evidence of significant (greater than 50%) stenosis. Skeleton: Subtle height loss and sclerosis of the T2 superior endplate. Other neck: No evidence of acute abnormality. Upper chest: Visualized lung apices are clear. Review of the MIP images confirms the above findings CTA HEAD FINDINGS Anterior circulation: Bilateral intracranial ICAs are patent with mild calcific atherosclerosis. Bilateral MCAs and ACAs are patent  without proximal hemodynamically significant stenosis. Early right M1 MCA bifurcation. Posterior circulation: Bilateral intradural vertebral arteries, basilar artery and posterior cerebral arteries are patent. Moderate bilateral P2 PCA stenosis. Venous sinuses: As permitted by contrast timing, patent. Review of the MIP images confirms the above findings IMPRESSION: 1. No large vessel occlusion. 2. Moderate bilateral P2 PCA stenosis. 3. Bilateral carotid bifurcation atherosclerosis without greater than 50% stenosis. 4. Subtle height loss and sclerosis of the T2 superior endplate, which may represent a recent superior endplate fracture and appears new since March 21, 2021 study. An MRI of the thoracic spine could better evaluate if clinically indicated. Electronically Signed   By: FMargaretha SheffieldM.D.   On: 04/12/2021 17:58   DG Cervical Spine Complete  Result Date: 03/28/2021 CLINICAL DATA:  fall with neck pain EXAM: CERVICAL SPINE - COMPLETE 4+ VIEW COMPARISON:  None. FINDINGS: Limited exam as C5 is the lowest well visualized cervical level on lateral view. Within this limitation, there is no  evidence of cervical spine fracture or prevertebral soft tissue swelling. Alignment is normal. No other significant bone abnormalities are identified. IMPRESSION: Limited exam. No evidence of fracture or traumatic listhesis of the cervical spine. Electronically Signed   By: Davina Poke D.O.   On: 03/28/2021 12:18   CT Head Wo Contrast  Result Date: 03/25/2021 CLINICAL DATA:  TIA, worsening left-sided weakness EXAM: CT HEAD WITHOUT CONTRAST TECHNIQUE: Contiguous axial images were obtained from the base of the skull through the vertex without intravenous contrast. RADIATION DOSE REDUCTION: This exam was performed according to the departmental dose-optimization program which includes automated exposure control, adjustment of the mA and/or kV according to patient size and/or use of iterative reconstruction  technique. COMPARISON:  03/21/2021 FINDINGS: Brain: There are no signs of bleeding within the cranium. There is abnormal prominence of third and both lateral ventricles. Fourth ventricle is not dilated. Findings may suggest aqueduct stenosis or normal pressure hydrocephalus. Subtle decreased density in right basal ganglia/posterior limb of internal capsule which was not evident in the previous study. There is subtle decreased density in the right frontal cortex with no significant change. Vascular: Unremarkable Skull: Unremarkable. Sinuses/Orbits: Unremarkable. Other: None IMPRESSION: There are no signs of bleeding within the cranium. Small new foci of decreased density are noted in the right basal ganglia and right internal capsule, possibly new areas of ischemia in the right MCA distribution. Follow-up MRI as clinically warranted should be considered. There is dilation of third and both lateral ventricles which may suggest aqueduct stenosis or normal pressure hydrocephalus. Electronically Signed   By: Elmer Picker M.D.   On: 03/25/2021 15:39   MR BRAIN WO CONTRAST  Result Date: 04/11/2021 CLINICAL DATA:  Neuro deficit, acute, stroke suspected visual loss EXAM: MRI HEAD WITHOUT CONTRAST TECHNIQUE: Multiplanar, multiecho pulse sequences of the brain and surrounding structures were obtained without intravenous contrast. COMPARISON:  MRI March 25, 2021. FINDINGS: Brain: New/interval acute infarct in the LEFT temporal stem. Evolving infarct in the right posterior limb of the internal capsule and RIGHT temporal stem with decreasing edema and less avid restricted diffusion. Small remote right cerebellar infarcts. T2 hyperintensities elsewhere in the cerebral white matter bilaterally and in the pons are unchanged and are nonspecific but compatible with mild chronic microvascular ischemic disease. Unchanged moderate lateral and third ventriculomegaly. Normal cerebral sulci. No evidence of acute hemorrhage,  mass lesion, midline shift. Vascular: Major arterial flow voids are maintained at the skull base. Skull and upper cervical spine: Normal marrow signal. Sinuses/Orbits: Clear sinuses.  Unremarkable orbits. Other: Small left mastoid effusion. IMPRESSION: 1. New/interval acute infarct in the LEFT temporal stem. 2. Evolving infarct in the right posterior limb of the internal capsule and RIGHT temporal stem. 3. Mild chronic microvascular ischemic disease and small remote cerebellar infarcts. 4. Unchanged ventriculomegaly, which is nonspecific but could represent normal pressure hydrocephalus in the correct clinical setting. Electronically Signed   By: Margaretha Sheffield M.D.   On: 04/11/2021 16:01   MR BRAIN WO CONTRAST  Result Date: 03/25/2021 CLINICAL DATA:  Neuro deficit, acute, stroke suspected. Left-sided body numbness. EXAM: MRI HEAD WITHOUT CONTRAST TECHNIQUE: Multiplanar, multiecho pulse sequences of the brain and surrounding structures were obtained without intravenous contrast. COMPARISON:  Head CT 03/25/2021 and MRI 03/22/2021 FINDINGS: Multiple sequences are mildly to moderately motion degraded. Brain: An acute infarct involving the posterior limb of the right internal capsule and right temporal stem has enlarged from the prior MRI. The small acute infarct in the left lentiform nucleus on the  prior MRI is no longer visible on diffusion-weighted imaging. T2 hyperintensities elsewhere in the cerebral white matter bilaterally and in the pons are unchanged and are nonspecific but compatible with mild chronic small vessel ischemic disease. Moderate lateral and third ventriculomegaly is unchanged. The cerebral sulci are normal in size. No intracranial hemorrhage, mass, midline shift, or extra-axial fluid collection is identified. Small chronic right cerebellar infarcts are again noted. Vascular: Major intracranial vascular flow voids are preserved. Skull and upper cervical spine: Unremarkable bone marrow signal.  Sinuses/Orbits: Unremarkable orbits. Clear paranasal sinuses. Trace left mastoid fluid. Other: None. IMPRESSION: 1. Enlargement of an acute infarct involving the posterior limb of the right internal capsule and right temporal stem since the prior MRI. 2. Mild chronic small vessel ischemic disease with small chronic cerebellar infarcts. 3. Unchanged ventriculomegaly which is nonspecific but could reflect normal pressure hydrocephalus in the appropriate clinical setting. Electronically Signed   By: Logan Bores M.D.   On: 03/25/2021 18:24   MR BRAIN WO CONTRAST  Result Date: 03/23/2021 CLINICAL DATA:  Initial evaluation for neuro deficit, stroke suspected. EXAM: MRI HEAD WITHOUT CONTRAST TECHNIQUE: Multiplanar, multiecho pulse sequences of the brain and surrounding structures were obtained without intravenous contrast. COMPARISON:  Prior CT and CTA from 03/21/2021. FINDINGS: Brain: Examination degraded by motion artifact. Cerebral volume within normal limits. Few small remote right cerebellar infarcts noted. Mild scattered patchy T2/FLAIR signal abnormality seen involving the periventricular and deep white matter both cerebral hemispheres, nonspecific, but most commonly related to chronic microvascular ischemic disease. Mild patchy involvement of the pons. Overall, appearance is mild in nature. Curvilinear focus of restricted diffusion seen involving the posterior right basal ganglia, extending from the right lentiform nucleus towards the right periatrial white matter, consistent with an acute ischemic infarct (series 3, image 25). This measures up to approximately 2 cm in length. In additional smaller curvilinear focus of diffusion abnormality involving the lateral margin of the contralateral left lentiform nucleus measures up to 1.2 cm, also consistent with an acute ischemic infarct (series 3, image 28). No associated hemorrhage or mass effect. Gray-white matter differentiation otherwise maintained. No other  areas of chronic cortical infarction. No acute or chronic intracranial blood products. No mass lesion or midline shift. Diffuse ventricular prominence, most pronounced at the lateral and third ventricles, out of proportion to cortical sulcation. Finding is nonspecific, but could reflect a degree of NPH. Periventricular FLAIR signal intensity may in part reflect associated transependymal flow of CSF. No extra-axial fluid collection. Pituitary gland suprasellar region within normal limits. Midline structures intact. Vascular: Major intracranial vascular flow voids are maintained. Skull and upper cervical spine: Craniocervical junction within normal limits. Bone marrow signal intensity normal. No scalp soft tissue abnormality. Sinuses/Orbits: Globes and orbital soft tissues within normal limits. Paranasal sinuses are largely clear. Trace left mastoid effusion noted, of doubtful significance. Other: None. IMPRESSION: 1. Acute ischemic infarcts involving the bilateral basal ganglia as above, right larger than left. No associated hemorrhage or mass effect. 2. Diffuse ventricular prominence, out of proportion to cortical sulcation. Finding is nonspecific, but could reflect changes of NPH in the correct clinical setting. No visible structural abnormality about the cerebral aqueduct. 3. Few small remote right cerebellar infarcts. 4. Underlying mild chronic microvascular ischemic disease. Electronically Signed   By: Jeannine Boga M.D.   On: 03/23/2021 01:48   ECHOCARDIOGRAM COMPLETE  Result Date: 03/22/2021    ECHOCARDIOGRAM REPORT   Patient Name:   MARKEE REMLINGER Date of Exam: 03/22/2021 Medical Rec #:  [id number]        Height:       61.0 in Accession #:    [id number]       Weight:       142.0 lb Date of Birth:  February 26, 1965        BSA:          1.633 m Patient Age:    53 years         BP:           170/78 mmHg Patient Gender: F                HR:           66 bpm. Exam Location:  Inpatient Procedure: 2D Echo  Indications:    Stroke  History:        Patient has no prior history of Echocardiogram examinations.                 Risk Factors:Dyslipidemia and Diabetes.  Sonographer:    Arlyss Gandy Referring Phys: [id number] [place]  1. Left ventricular ejection fraction, by estimation, is 55%. The left ventricle has normal function. The left ventricle has no regional wall motion abnormalities. There is mild left ventricular hypertrophy. Left ventricular diastolic parameters are consistent with Grade I diastolic dysfunction (impaired relaxation).  2. Right ventricular systolic function is normal. The right ventricular size is normal. Tricuspid regurgitation signal is inadequate for assessing PA pressure.  3. Left atrial size was mildly dilated.  4. The mitral valve is normal in structure. Trivial mitral valve regurgitation. No evidence of mitral stenosis.  5. The aortic valve is tricuspid. Aortic valve regurgitation is not visualized. No aortic stenosis is present.  6. The inferior vena cava is normal in size with greater than 50% respiratory variability, suggesting right atrial pressure of 3 mmHg. FINDINGS  Left Ventricle: Left ventricular ejection fraction, by estimation, is 55%. The left ventricle has normal function. The left ventricle has no regional wall motion abnormalities. The left ventricular internal cavity size was normal in size. There is mild left ventricular hypertrophy. Left ventricular diastolic parameters are consistent with Grade I diastolic dysfunction (impaired relaxation). Right Ventricle: The right ventricular size is normal. No increase in right ventricular wall thickness. Right ventricular systolic function is normal. Tricuspid regurgitation signal is inadequate for assessing PA pressure. Left Atrium: Left atrial size was mildly dilated. Right Atrium: Right atrial size was normal in size. Pericardium: There is no evidence of pericardial effusion. Mitral Valve: The mitral valve is  normal in structure. Trivial mitral valve regurgitation. No evidence of mitral valve stenosis. Tricuspid Valve: The tricuspid valve is normal in structure. Tricuspid valve regurgitation is trivial. Aortic Valve: The aortic valve is tricuspid. Aortic valve regurgitation is not visualized. No aortic stenosis is present. Aortic valve mean gradient measures 4.0 mmHg. Aortic valve peak gradient measures 7.5 mmHg. Aortic valve area, by VTI measures 1.99 cm. Pulmonic Valve: The pulmonic valve was normal in structure. Pulmonic valve regurgitation is not visualized. Aorta: The aortic root is normal in size and structure. Venous: The inferior vena cava is normal in size with greater than 50% respiratory variability, suggesting right atrial pressure of 3 mmHg. IAS/Shunts: No atrial level shunt detected by color flow Doppler.  LEFT VENTRICLE PLAX 2D LVIDd:         3.90 cm   Diastology LVIDs:         2.80 cm   LV e' medial:    5.22 cm/s  LV PW:         0.90 cm   LV E/e' medial:  12.6 LV IVS:        0.90 cm   LV e' lateral:   5.33 cm/s LVOT diam:     2.00 cm   LV E/e' lateral: 12.3 LV SV:         67 LV SV Index:   41 LVOT Area:     3.14 cm  RIGHT VENTRICLE            IVC RV Basal diam:  3.40 cm    IVC diam: 1.70 cm RV Mid diam:    2.80 cm RV S prime:     9.36 cm/s TAPSE (M-mode): 1.8 cm LEFT ATRIUM             Index        RIGHT ATRIUM           Index LA diam:        4.10 cm 2.51 cm/m   RA Area:     12.60 cm LA Vol (A2C):   62.1 ml 38.03 ml/m  RA Volume:   28.20 ml  17.27 ml/m LA Vol (A4C):   45.1 ml 27.62 ml/m LA Biplane Vol: 54.9 ml 33.62 ml/m  AORTIC VALVE AV Area (Vmax):    2.32 cm AV Area (Vmean):   1.89 cm AV Area (VTI):     1.99 cm AV Vmax:           137.00 cm/s AV Vmean:          97.300 cm/s AV VTI:            0.337 m AV Peak Grad:      7.5 mmHg AV Mean Grad:      4.0 mmHg LVOT Vmax:         101.00 cm/s LVOT Vmean:        58.600 cm/s LVOT VTI:          0.213 m LVOT/AV VTI ratio: 0.63  AORTA Ao Root diam: 2.20 cm  Ao Asc diam:  2.90 cm MITRAL VALVE MV Area (PHT): 2.74 cm    SHUNTS MV Decel Time: 277 msec    Systemic VTI:  0.21 m MV E velocity: 65.60 cm/s  Systemic Diam: 2.00 cm MV A velocity: 86.10 cm/s MV E/A ratio:  0.76 Dalton McleanMD Electronically signed by Franki Monte Signature Date/Time: 03/22/2021/2:38:33 PM    Final    ECHO TEE  Result Date: 03/26/2021    TRANSESOPHOGEAL ECHO REPORT   Patient Name:   NAUTIA LEM Date of Exam: 03/26/2021 Medical Rec #:  [id number]        Height:       61.0 in Accession #:    [id number]       Weight:       145.5 lb Date of Birth:  October 12, 1965        BSA:          1.650 m Patient Age:    47 years         BP:           179/78 mmHg Patient Gender: F                HR:           85 bpm. Exam Location:  Inpatient Procedure: Transesophageal Echo, Color Doppler, Cardiac Doppler and Saline            Contrast  Bubble Study Indications:     Stroke  History:         Patient has prior history of Echocardiogram examinations, most                  recent 03/22/2021. Risk Factors:Diabetes and Dyslipidemia.  Sonographer:     Bernadene Person RDCS Referring Phys:  [place] Diagnosing Phys: Oswaldo Milian MD PROCEDURE: After discussion of the risks and benefits of a TEE, an informed consent was obtained from the patient. The transesophogeal probe was passed without difficulty through the esophogus of the patient. Sedation performed by different physician. The patient was monitored while under deep sedation. Anesthestetic sedation was provided intravenously by Anesthesiology: 244.'37mg'$  of Propofol. The patient's vital signs; including heart rate, blood pressure, and oxygen saturation; remained stable throughout the procedure. The patient developed no complications during the procedure. IMPRESSIONS  1. Left ventricular ejection fraction, by estimation, is 55 to 60%. The left ventricle has normal function.  2. Right ventricular systolic function is normal. The right ventricular size  is normal.  3. Left atrial size was mildly dilated. No left atrial/left atrial appendage thrombus was detected.  4. The mitral valve is normal in structure. Mild mitral valve regurgitation.  5. The aortic valve is tricuspid. Aortic valve regurgitation is not visualized. No aortic stenosis is present.  6. Agitated saline contrast bubble study was negative, with no evidence of any interatrial shunt. Conclusion(s)/Recommendation(s): No LA/LAA thrombus identified. Negative bubble study for interatrial shunt. No intracardiac source of embolism detected on this on this transesophageal echocardiogram. FINDINGS  Left Ventricle: Left ventricular ejection fraction, by estimation, is 55 to 60%. The left ventricle has normal function. The left ventricular internal cavity size was normal in size. Right Ventricle: The right ventricular size is normal. No increase in right ventricular wall thickness. Right ventricular systolic function is normal. Left Atrium: Left atrial size was mildly dilated. No left atrial/left atrial appendage thrombus was detected. Right Atrium: Right atrial size was normal in size. Pericardium: There is no evidence of pericardial effusion. Mitral Valve: The mitral valve is normal in structure. Mild mitral valve regurgitation. Tricuspid Valve: The tricuspid valve is normal in structure. Tricuspid valve regurgitation is trivial. Aortic Valve: The aortic valve is tricuspid. Aortic valve regurgitation is not visualized. No aortic stenosis is present. Pulmonic Valve: The pulmonic valve was not well visualized. Pulmonic valve regurgitation is trivial. Aorta: The aortic root and ascending aorta are structurally normal, with no evidence of dilitation. IAS/Shunts: No atrial level shunt detected by color flow Doppler. Agitated saline contrast was given intravenously to evaluate for intracardiac shunting. Agitated saline contrast bubble study was negative, with no evidence of any interatrial shunt. Oswaldo Milian MD Electronically signed by Oswaldo Milian MD Signature Date/Time: 03/26/2021/2:20:42 PM    Final    CT HEAD CODE STROKE WO CONTRAST  Result Date: 04/11/2021 CLINICAL DATA:  Neuro deficit, acute, stroke suspected. Additional history provided: Left-sided weakness, left eye blurred vision. EXAM: CT HEAD WITHOUT CONTRAST TECHNIQUE: Contiguous axial images were obtained from the base of the skull through the vertex without intravenous contrast. RADIATION DOSE REDUCTION: This exam was performed according to the departmental dose-optimization program which includes automated exposure control, adjustment of the mA and/or kV according to patient size and/or use of iterative reconstruction technique. COMPARISON:  Brain MRI 03/25/2021.  Head CT 03/25/2021. FINDINGS: Brain: Moderate lateral and third ventriculomegaly, unchanged. Redemonstrated subacute infarct within the posterior limb of right internal capsule and right temporal stem.  Mild patchy and ill-defined hypoattenuation elsewhere within the cerebral white matter, nonspecific but compatible chronic small vessel ischemic disease. Redemonstrated small chronic infarcts within the superior right cerebellar hemisphere. There is no acute intracranial hemorrhage. No acute demarcated cortical infarct. No extra-axial fluid collection. No evidence of an intracranial mass. No midline shift. Vascular: No hyperdense vessel.  Atherosclerotic calcifications. Skull: Normal. Negative for fracture or focal lesion. Sinuses/Orbits: Visualized orbits show no acute finding. Small mucous retention cyst within a posterior left ethmoid air cell. ASPECTS ([place] Stroke Program Early CT Score) - Ganglionic level infarction (caudate, lentiform nuclei, internal capsule, insula, M1-M3 cortex): 7 - Supraganglionic infarction (M4-M6 cortex): 3 Total score (0-10 with 10 being normal): 10 (when discounting a subacute infarct within the posterior limb of right internal capsule and  right temporal stem). These results were communicated to Dr. Rory Percy at 1:35 pmon 3/9/2023by text page via the [facility] messaging system. IMPRESSION: No evidence of acute intracranial abnormality. Redemonstrated subacute infarct within the posterior limb of right internal capsule and right temporal stem. Background mild chronic small vessel ischemic changes within the cerebral white matter, stable from the head CT of 03/25/2021. Redemonstrated small chronic infarcts within the superior right cerebellar hemisphere. Unchanged moderate ventriculomegaly which is nonspecific, but could reflect sequela of normal pressure hydrocephalus in the appropriate clinical setting. Electronically Signed   By: Kellie Simmering D.O.   On: 04/11/2021 13:37   CT HEAD CODE STROKE WO CONTRAST  Result Date: 03/21/2021 CLINICAL DATA:  Code stroke.  Acute neurologic deficit EXAM: CT HEAD WITHOUT CONTRAST TECHNIQUE: Contiguous axial images were obtained from the base of the skull through the vertex without intravenous contrast. RADIATION DOSE REDUCTION: This exam was performed according to the departmental dose-optimization program which includes automated exposure control, adjustment of the mA and/or kV according to patient size and/or use of iterative reconstruction technique. COMPARISON:  None. FINDINGS: Brain: There is no mass, hemorrhage or extra-axial collection. Markedly enlarged lateral and third ventricles without a clear source of obstruction. There is cortical hypoattenuation in the right MCA territory. Vascular: No abnormal hyperdensity of the major intracranial arteries or dural venous sinuses. No intracranial atherosclerosis. Skull: The visualized skull base, calvarium and extracranial soft tissues are normal. Sinuses/Orbits: No fluid levels or advanced mucosal thickening of the visualized paranasal sinuses. No mastoid or middle ear effusion. The orbits are normal. ASPECTS [facility] Stroke Program Early CT Score) - Ganglionic level  infarction (caudate, lentiform nuclei, internal capsule, insula, M1-M3 cortex): 7 - Supraganglionic infarction (M4-M6 cortex): 2 Total score (0-10 with 10 being normal): 9 IMPRESSION: 1. Early changes of likely right MCA small acute infarct. 2. Markedly enlarged lateral and third ventricles without a clear source of obstruction. This could indicate aqua ductal stenosis. 3. ASPECTS is 9. These results were called by telephone at the time of interpretation on 03/21/2021 at 9:47 pm to provider [facility], who verbally acknowledged these results. Electronically Signed   By: Ulyses Jarred M.D.   On: 03/21/2021 21:48   CT ANGIO HEAD NECK W WO CM (CODE STROKE)  Result Date: 03/21/2021 CLINICAL DATA:  Code stroke EXAM: CT ANGIOGRAPHY HEAD AND NECK TECHNIQUE: Multidetector CT imaging of the head and neck was performed using the standard protocol during bolus administration of intravenous contrast. Multiplanar CT image reconstructions and MIPs were obtained to evaluate the vascular anatomy. Carotid stenosis measurements (when applicable) are obtained utilizing NASCET criteria, using the distal internal carotid diameter as the denominator. RADIATION DOSE REDUCTION: This exam was performed according to the  departmental dose-optimization program which includes automated exposure control, adjustment of the mA and/or kV according to patient size and/or use of iterative reconstruction technique. CONTRAST:  67m OMNIPAQUE IOHEXOL 350 MG/ML SOLN COMPARISON:  None. FINDINGS: CTA NECK FINDINGS SKELETON: There is no bony spinal canal stenosis. No lytic or blastic lesion. OTHER NECK: Normal pharynx, larynx and major salivary glands. No cervical lymphadenopathy. Unremarkable thyroid gland. UPPER CHEST: No pneumothorax or pleural effusion. No nodules or masses. AORTIC ARCH: There is calcific atherosclerosis of the aortic arch. There is no aneurysm, dissection or hemodynamically significant stenosis of the visualized portion of  the aorta. Conventional 3 vessel aortic branching pattern. The visualized proximal subclavian arteries are widely patent. RIGHT CAROTID SYSTEM: Normal without aneurysm, dissection or stenosis. LEFT CAROTID SYSTEM: Normal without aneurysm, dissection or stenosis. VERTEBRAL ARTERIES: Left dominant configuration. Both origins are clearly patent. There is no dissection, occlusion or flow-limiting stenosis to the skull base (V1-V3 segments). CTA HEAD FINDINGS POSTERIOR CIRCULATION: --Vertebral arteries: Normal V4 segments. --Inferior cerebellar arteries: Normal. --Basilar artery: Normal. --Superior cerebellar arteries: Normal. --Posterior cerebral arteries (PCA): Normal. ANTERIOR CIRCULATION: --Intracranial internal carotid arteries: Normal. --Anterior cerebral arteries (ACA): Normal. Both A1 segments are present. Patent anterior communicating artery (a-comm). --Middle cerebral arteries (MCA): Normal. VENOUS SINUSES: As permitted by contrast timing, patent. ANATOMIC VARIANTS: None Review of the MIP images confirms the above findings. IMPRESSION: 1. No emergent large vessel occlusion or high-grade stenosis of the intracranial arteries. Aortic Atherosclerosis (ICD10-I70.0). Electronically Signed   By: KUlyses JarredM.D.   On: 03/21/2021 22:27    Microbiology: Results for orders placed or performed during the hospital encounter of 04/11/21  Resp Panel by RT-PCR (Flu A&B, Covid) Nasopharyngeal Swab     Status: None   Collection Time: 04/11/21  1:11 PM   Specimen: Nasopharyngeal Swab; Nasopharyngeal(NP) swabs in vial transport medium  Result Value Ref Range Status   SARS Coronavirus 2 by RT PCR NEGATIVE NEGATIVE Final    Comment: (NOTE) SARS-CoV-2 target nucleic acids are NOT DETECTED.  The SARS-CoV-2 RNA is generally detectable in upper respiratory specimens during the acute phase of infection. The lowest concentration of SARS-CoV-2 viral copies this assay can detect is 138 copies/mL. A negative result does  not preclude SARS-Cov-2 infection and should not be used as the sole basis for treatment or other patient management decisions. A negative result may occur with  improper specimen collection/handling, submission of specimen other than nasopharyngeal swab, presence of viral mutation(s) within the areas targeted by this assay, and inadequate number of viral copies(<138 copies/mL). A negative result must be combined with clinical observations, patient history, and epidemiological information. The expected result is Negative.  Fact Sheet for Patients:  hEntrepreneurPulse.com.au Fact Sheet for Healthcare Providers:  hIncredibleEmployment.be This test is no t yet approved or cleared by the [place] and  has been authorized for detection and/or diagnosis of SARS-CoV-2 by FDA under an Emergency Use Authorization (EUA). This EUA will remain  in effect (meaning this test can be used) for the duration of the COVID-19 declaration under Section 564(b)(1) of the Act, 21 U.S.C.section 360bbb-3(b)(1), unless the authorization is terminated  or revoked sooner.       Influenza A by PCR NEGATIVE NEGATIVE Final   Influenza B by PCR NEGATIVE NEGATIVE Final    Comment: (NOTE) The Xpert Xpress SARS-CoV-2/FLU/RSV plus assay is intended as an aid in the diagnosis of influenza from Nasopharyngeal swab specimens and should not be used as a sole basis for treatment.  Nasal washings and aspirates are unacceptable for Xpert Xpress SARS-CoV-2/FLU/RSV testing.  Fact Sheet for Patients: EntrepreneurPulse.com.au  Fact Sheet for Healthcare Providers: IncredibleEmployment.be  This test is not yet approved or cleared by the [place] FDA and has been authorized for detection and/or diagnosis of SARS-CoV-2 by FDA under an Emergency Use Authorization (EUA). This EUA will remain in effect (meaning this test can be used) for the  duration of the COVID-19 declaration under Section 564(b)(1) of the Act, 21 U.S.C. section 360bbb-3(b)(1), unless the authorization is terminated or revoked.  Performed at [facility], [place] [address] [place], [place] [address]     Labs: CBC: Recent Labs  Lab 04/11/21 1311 04/11/21 1321 04/11/21 1822  WBC 12.0*  --  10.9*  NEUTROABS 7.3  --   --   HGB 14.1 13.3 12.7  HCT 39.6 39.0 35.6*  MCV 88.6  --  87.7  PLT 290  --  938   Basic Metabolic Panel: Recent Labs  Lab 04/11/21 1311 04/11/21 1321 04/11/21 1822  NA 136 139  --   K 3.9 3.9  --   CL 104 102  --   CO2 25  --   --   GLUCOSE 172* 167*  --   BUN 12 13  --   CREATININE 0.97 0.90 0.97  CALCIUM 9.5  --   --    Liver Function Tests: Recent Labs  Lab 04/11/21 1311  AST 26  ALT 23  ALKPHOS 91  BILITOT 0.5  PROT 6.8  ALBUMIN 3.8   CBG: Recent Labs  Lab 04/12/21 1203 04/12/21 1746 04/12/21 2211 04/13/21 0637 04/13/21 1224  GLUCAP 146* 134* 163* 145* 184*    Discharge time spent: greater than 30 minutes.  Signed: Estill Cotta, MD [facility] 04/13/2021

## 2021-04-15 ENCOUNTER — Telehealth: Payer: Self-pay

## 2021-04-15 LAB — PROTHROMBIN GENE MUTATION

## 2021-04-15 LAB — FACTOR 5 LEIDEN

## 2021-04-15 MED ORDER — PRALUENT 75 MG/ML ~~LOC~~ SOAJ: 75.0000 mg | SUBCUTANEOUS | 1 refills | Status: DC

## 2021-04-15 NOTE — Telephone Encounter (Signed)
Transition Care Management Follow-up Telephone Call ?Date of discharge and from where: 04[facility]13/21 [facility] ?How have you been since you were released from the hospital? Feeling the same, no better or worse since d/c ?Any questions or concerns? No ? ?Items Reviewed: ?Did the pt receive and understand the discharge instructions provided? Yes  ?Medications obtained and verified? Yes  ?Other? No  ?Any new allergies since your discharge? No  ?Dietary orders reviewed? Yes ?Do you have support at home? Yes  ? ?Home Care and Equipment/Supplies: ?Were home health services ordered? no ? ?Were any new equipment or medical supplies ordered?  No ? ?Functional Questionnaire: (I = Independent and D = Dependent) ?ADLs: I ? ?Bathing/Dressing- I ? ?Meal Prep- I ? ?Eating- I ? ?Maintaining continence- I ? ?Transferring/Ambulation- I ? ?Managing Meds- I ? ?Follow up appointments reviewed: ? ?PCP Hospital f/u appt confirmed? Yes  Scheduled to see Hendricks Limes on 04/19/21 @ 11:35. ?[facility] f/u appt confirmed? Yes  Scheduled to see Megan Millikan/Neuro on 04/23/21 @ 10. ?Are transportation arrangements needed? No  ?If their condition worsens, is the pt aware to call PCP or go to the Emergency Dept.? Yes ?Was the patient provided with contact information for the PCP's office or ED? Yes ?Was to pt encouraged to call back with questions or concerns? Yes  ?

## 2021-04-15 NOTE — Telephone Encounter (Signed)
Refill failed. Resent. 

## 2021-04-15 NOTE — Addendum Note (Signed)
Addended by: Antonietta Barcelona D on: 04/15/2021 10:43 AM ? ? Modules accepted: Orders ? ?

## 2021-04-15 NOTE — Telephone Encounter (Signed)
Attempted to put in PA and states it is not required for [place].  Medication pended. Is it ok to send in? Not on med list  ?

## 2021-04-17 ENCOUNTER — Ambulatory Visit: Payer: 59

## 2021-04-17 ENCOUNTER — Other Ambulatory Visit: Payer: Self-pay

## 2021-04-17 VITALS — BP 143/82

## 2021-04-17 DIAGNOSIS — M6281 Muscle weakness (generalized): Secondary | ICD-10-CM | POA: Diagnosis not present

## 2021-04-17 DIAGNOSIS — R262 Difficulty in walking, not elsewhere classified: Secondary | ICD-10-CM

## 2021-04-17 NOTE — Therapy (Signed)
[place] [facility] ?[place] ?[place], [place], [address] ?Phone: [phone]   Fax:  [phone] ? ?Physical Therapy Treatment ? ?Patient Details  ?Name: Hannah Mcdaniel ?MRN: [id number] ?Date of Birth: 1965-03-14 ?Referring Provider (PT): Thereasa Solo, MD ? ? ?Encounter Date: 04/17/2021 ? ? PT End of Session - 04/17/21 1040   ? ? Visit Number 6   ? Number of Visits 16   ? Date for PT Re-Evaluation 06/07/21   ? PT Start Time 1030   ? PT Stop Time 1115   ? PT Time Calculation (min) 45 min   ? Activity Tolerance Patient tolerated treatment well   ? Behavior During Therapy [facility] for tasks assessed/performed   ? ?  ?  ? ?  ? ? ?Past Medical History:  ?Diagnosis Date  ? Anxiety   ? Arterial occlusive disease Nov. 2014  ? Arthritis   ? Claudication of lower extremity [facility]) Nov. 2014  ? Right Lower Extremity rest pain  ? Colon polyps   ? Depression   ? Diabetes mellitus without complication ([place])   ? GERD (gastroesophageal reflux disease)   ? Hyperlipidemia   ? Migraines   ? Vertigo   ? ? ?Past Surgical History:  ?Procedure Laterality Date  ? ABDOMINAL AORTAGRAM N/A 01/10/2013  ? Procedure: ABDOMINAL AORTAGRAM;  Surgeon: Rosetta Posner, MD;  Location: [facility] CATH LAB;  Service: Cardiovascular;  Laterality: N/A;  ? ABDOMINAL AORTOGRAM W/LOWER EXTREMITY Bilateral 07/16/2018  ? Procedure: ABDOMINAL AORTOGRAM W/LOWER EXTREMITY;  Surgeon: Angelia Mould, MD;  Location: [place] CV LAB;  Service: Cardiovascular;  Laterality: Bilateral;  ? ABDOMINAL AORTOGRAM W/LOWER EXTREMITY N/A 10/05/2020  ? Procedure: ABDOMINAL AORTOGRAM W/LOWER EXTREMITY;  Surgeon: Angelia Mould, MD;  Location: [place] CV LAB;  Service: Cardiovascular;  Laterality: N/A;  ? BUBBLE STUDY  03/26/2021  ? Procedure: BUBBLE STUDY;  Surgeon: Donato Heinz, MD;  Location: [place];  Service: Cardiovascular;;  ? CHOLECYSTECTOMY    ? Gall Bladder  ? iliac artery angioplasty and stent placement  01/10/13  ?  LOWER EXTREMITY ANGIOGRAM Bilateral 01/10/2013  ? Procedure: LOWER EXTREMITY ANGIOGRAM;  Surgeon: Rosetta Posner, MD;  Location: [facility] CATH LAB;  Service: Cardiovascular;  Laterality: Bilateral;  ? PERCUTANEOUS STENT INTERVENTION Right 01/10/2013  ? Procedure: PERCUTANEOUS STENT INTERVENTION;  Surgeon: Rosetta Posner, MD;  Location: [facility] CATH LAB;  Service: Cardiovascular;  Laterality: Right;  rt common iliac stent  ? PERIPHERAL VASCULAR CATHETERIZATION N/A 11/26/2015  ? Procedure: Abdominal Aortogram w/Lower Extremity;  Surgeon: Angelia Mould, MD;  Location: [place] CV LAB;  Service: Cardiovascular;  Laterality: N/A;  ? PERIPHERAL VASCULAR CATHETERIZATION Right 11/26/2015  ? Procedure: Peripheral Vascular Balloon Angioplasty;  Surgeon: Angelia Mould, MD;  Location: [place] CV LAB;  Service: Cardiovascular;  Laterality: Right;  rt common iliac  ? PERIPHERAL VASCULAR INTERVENTION  07/16/2018  ? Procedure: PERIPHERAL VASCULAR INTERVENTION;  Surgeon: Angelia Mould, MD;  Location: [place] CV LAB;  Service: Cardiovascular;;  ? PERIPHERAL VASCULAR INTERVENTION Right 10/05/2020  ? Procedure: PERIPHERAL VASCULAR INTERVENTION;  Surgeon: Angelia Mould, MD;  Location: [place] CV LAB;  Service: Cardiovascular;  Laterality: Right;  Common and exteral iliac artery  ? TEE WITHOUT CARDIOVERSION N/A 03/26/2021  ? Procedure: TRANSESOPHAGEAL ECHOCARDIOGRAM (TEE);  Surgeon: Donato Heinz, MD;  Location: [facility] ENDOSCOPY;  Service: Cardiovascular;  Laterality: N/A;  ? TOTAL ABDOMINAL HYSTERECTOMY    ? ? ?Vitals:  ? 04/17/21 1057  ?BP: (!) 143/82  ? ? ?  Subjective Assessment - 04/17/21 1035   ? ? Subjective Patient reports that she had another stroke last Thursday. She was in the hospital until Saturday. She notes that they don't know why she is having these strokes. The only changes she has notices is that her left eye is blurry and a little left sided weakness. She is going to call and schedule  an eye appointment after her appointment today. She has continued to do some exercises at home.   ? Pertinent History history of CVA   ? Limitations Lifting   ? How long can you stand comfortably? unable to stand without support   ? Patient Stated Goals walk independently and drive   ? Currently in Pain? No/denies   ? ?  ?  ? ?  ? ? ? ? ? [facility] PT Assessment - 04/17/21 0001   ? ?  ? Balance Screen  ? How many times? No new falls since 04/11/21   ?  ? Strength  ? Strength Assessment Site Hip;Knee;Ankle;Shoulder   ? Right/Left Shoulder Right;Left   ? Right Shoulder Flexion 4-/5   ? Left Shoulder Flexion 3+/5   ? Right Hand Grip (lbs) 30   ? Left Hand Grip (lbs) 25   ? Right Hip Flexion 4/5   ? Left Hip Flexion 3/5   ? Right/Left Knee Right;Left   ? Right Knee Flexion 4/5   ? Right Knee Extension 4+/5   ? Left Knee Flexion 3+/5   ? Left Knee Extension 4-/5   ? Right/Left Ankle Right;Left   ? Right Ankle Dorsiflexion 4-/5   ? Left Ankle Dorsiflexion 3+/5   ?  ? Transfers  ? Transfers Sit to Stand;Stand to Sit   ? Sit to Stand 6: Modified independent (Device/Increase time);With upper extremity assist;With armrests   ? Five time sit to stand comments  16.6 seconds   ? Stand to Sit 6: Modified independent (Device/Increase time);With armrests;With upper extremity assist   ?  ? Ambulation/Gait  ? Ambulation/Gait Yes   ? Ambulation/Gait Assistance 6: Modified independent (Device/Increase time)   ? Assistive device Rolling walker   ? Gait Pattern Step-through pattern;Decreased stride length;Decreased hip/knee flexion - right;Decreased hip/knee flexion - left;Decreased dorsiflexion - left;Decreased dorsiflexion - right;Left foot flat;Right foot flat;Poor foot clearance - left;Poor foot clearance - right   ? Ambulation Surface Level;Indoor   ? Gait velocity decreased   ?  ? Standardized Balance Assessment  ? Standardized Balance Assessment Five Times Sit to Stand;Timed Up and Go Test   ? Five times sit to stand comments  16.6  seconds   ?  ? Timed Up and Go Test  ? TUG Normal TUG   ? Normal TUG (seconds) 12   with rolling walker  ? ?  ?  ? ?  ? ? ? ? ? ? ? ? ? ? ? ? ? ? ? ? [place] Adult PT Treatment/Exercise - 04/17/21 0001   ? ?  ? Knee/Hip Exercises: Aerobic  ? Nustep L3 x 18 minutes   ?  ? Shoulder Exercises: Seated  ? Other Seated Exercises [place] twisting   red; 2 minutes  ? ?  ?  ? ?  ? ? ? ? ? ? ? ? ? ? ? ? ? ? ? PT Long Term Goals - 04/17/21 1519   ? ?  ? PT LONG TERM GOAL #1  ? Title Patient will be independent with her HEP.   ? Time 6   ?  Period Weeks   ? Status Achieved   ? Target Date 05/13/21   ?  ? PT LONG TERM GOAL #2  ? Title Patient will be able to safely ambulate at least 80 feet with a cane or least restrictive device for improved household mobility.   ? Baseline 04/10/21: 66 ft x 2 with SPC, left foot slight drag; 3/15: rolling walker with gait deviations   ? Time 6   ? Period Weeks   ? Status On-going   ? Target Date 05/13/21   ?  ? PT LONG TERM GOAL #3  ? Title Patient will improve her grip strength of her left hand to at least 30 pounds for improved functional use of her LUE.   ? Baseline 04/10/21: 35 pounds 3/15: 25 pounds   ? Time 6   ? Period Weeks   ? Status On-going   ? Target Date 05/13/21   ?  ? PT LONG TERM GOAL #4  ? Title Patient will improve her 5x sit to stand time to 12 seconds or less for improved safety and reduced fall risk.   ? Baseline 04/10/21: 18.97 seconds 3/15: 16 seconds   ? Time 6   ? Period Weeks   ? Status Revised   ? Target Date 05/13/21   ? ?  ?  ? ?  ? ? ? ? ? ? ? ? Plan - 04/17/21 1040   ? ? Clinical Impression Statement Today's reevaluation was performed today due to her recent CVA which occured on 04/11/21. She presented with minimal subjective and objective changes since her initial evaluation. Her primary changes were a mild increase in right sided weakness. However, she was able to improve multiple objective measures since her initial evaluation such as her five time sit to stand, left  grip strength, and her left sided strength. She reported feeling good since she did not experience a major setback due to this CVA. Recommend that she continue with skilled physical therapy to address her rema

## 2021-04-19 ENCOUNTER — Encounter: Payer: Self-pay | Admitting: Family Medicine

## 2021-04-19 ENCOUNTER — Ambulatory Visit: Payer: 59

## 2021-04-19 ENCOUNTER — Other Ambulatory Visit: Payer: Self-pay

## 2021-04-19 ENCOUNTER — Ambulatory Visit (INDEPENDENT_AMBULATORY_CARE_PROVIDER_SITE_OTHER): Payer: 59 | Admitting: Family Medicine

## 2021-04-19 VITALS — BP 127/77 | HR 81 | Temp 97.2°F | Ht 61.0 in | Wt 136.4 lb

## 2021-04-19 VITALS — BP 138/71

## 2021-04-19 DIAGNOSIS — R262 Difficulty in walking, not elsewhere classified: Secondary | ICD-10-CM | POA: Diagnosis not present

## 2021-04-19 DIAGNOSIS — R630 Anorexia: Secondary | ICD-10-CM | POA: Diagnosis not present

## 2021-04-19 DIAGNOSIS — M6281 Muscle weakness (generalized): Secondary | ICD-10-CM | POA: Diagnosis not present

## 2021-04-19 DIAGNOSIS — Z09 Encounter for follow-up examination after completed treatment for conditions other than malignant neoplasm: Secondary | ICD-10-CM

## 2021-04-19 DIAGNOSIS — I6930 Unspecified sequelae of cerebral infarction: Secondary | ICD-10-CM | POA: Diagnosis not present

## 2021-04-19 DIAGNOSIS — G479 Sleep disorder, unspecified: Secondary | ICD-10-CM

## 2021-04-19 MED ORDER — MIRTAZAPINE 7.5 MG PO TABS: 7.5000 mg | ORAL_TABLET | Freq: Every day | ORAL | 2 refills | Status: DC

## 2021-04-19 NOTE — Progress Notes (Signed)
? ?Assessment & Plan:  ?1. History of CVA with residual deficit ?Patient to keep follow-up appointment with neurology next week. Continue current medications. She was previously prescribed Praluent but has not yet picked this up from the pharmacy. She is going to see her optometrist to follow-up on her vision. She will continue working with PT and OT. I have advised she call the cardiac monitoring company to explain the situation and request more supplies.  ? ?2. Hospital discharge follow-up ? ?3. Difficulty sleeping ?Uncontrolled. Started Remeron.  ?- mirtazapine (REMERON) 7.5 MG tablet; Take 1 tablet (7.5 mg total) by mouth at bedtime.  Dispense: 30 tablet; Refill: 2 ? ?4. Decreased appetite ?Uncontrolled. Started Remeron.  ?- mirtazapine (REMERON) 7.5 MG tablet; Take 1 tablet (7.5 mg total) by mouth at bedtime.  Dispense: 30 tablet; Refill: 2 ? ? ?Return in about 6 weeks (around 05/31/2021) for follow-up of chronic medication conditions. ? ?Hendricks Limes, MSN, APRN, FNP-C ?[place] ? ?Subjective:  ? ? Patient ID: Hannah Mcdaniel, female    DOB: [birth year], 56 y.o.   MRN: [id number] ? ?Patient Care Team: ?[name], FNP as PCP - General (Family Medicine) ?Angelia Mould, MD as Consulting Physician (Vascular Surgery) ?Harlen Labs, MD as Referring Physician (Optometry)  ? ?Chief Complaint:  ?Chief Complaint  ?Patient presents with  ? Transitions Of Care  ?  [facility] 3/9-3/11 - CVA. Patient states the has lost her appetite, not sleeping at night and the last CVA affected her hearing.   ? ? ?HPI: ?WHITLEY PATCHEN is a 56 y.o. female presenting on 04/19/2021 for Transitions Of Care [facility] 3/9-3/11 - CVA. Patient states the has lost her appetite, not sleeping at night and the last CVA affected her hearing. ) ? ?Patient is here for a hospital follow-up. She was admitted to [facility] 04/11/2021-04/13/2021 due to CVA. This is her third CVA in the past month. Neurology has recommended  aspirin and Brilinta x30 days and then switch back to aspirin and Plavix. She is to continue rosuvastatin. She is scheduled to follow-up with neurology on 04/23/2021. She is currently suppose to be wearing a 30-day cardiac monitor but states they took it off of her in the hospital and did not provide her a way to reapply it. She is working with PT and OT. Hypercoagulable workup negative. She reports today that since this most recent stroke her vision and hearing have been affected on the left side. She is also having trouble sleeping and has no appetite.  ? ?New complaints: ?None ? ? ?Social history: ? ?Relevant past medical, surgical, family and social history reviewed and updated as indicated. Interim medical history since our last visit reviewed. ? ?Allergies and medications reviewed and updated. ? ?DATA REVIEWED: CHART IN EPIC ? ?ROS: Negative unless specifically indicated above in HPI.  ? ? ?Current Outpatient Medications:  ?  Alirocumab (PRALUENT) 75 MG/ML SOAJ, Inject 75 mg into the skin every 14 (fourteen) days., Disp: 2 mL, Rfl: 1 ?  aspirin EC 81 MG tablet, Take 81 mg by mouth daily., Disp: , Rfl:  ?  Continuous Blood Gluc Receiver (FREESTYLE LIBRE 2 READER) DEVI, 1 Device by Does not apply route continuous., Disp: 1 each, Rfl: 0 ?  Continuous Blood Gluc Sensor (FREESTYLE LIBRE 2 SENSOR) MISC, 1 Device by Does not apply route every 14 (fourteen) days., Disp: 2 each, Rfl: 5 ?  ezetimibe (ZETIA) 10 MG tablet, Take 1 tablet (10 mg total) by mouth  daily., Disp: 90 tablet, Rfl: 1 ?  FLUoxetine (PROZAC) 40 MG capsule, Take 1 capsule (40 mg total) by mouth daily., Disp: 90 capsule, Rfl: 1 ?  levocetirizine (XYZAL) 5 MG tablet, TAKE 1 TABLET BY MOUTH EVERY DAY IN THE EVENING (Patient taking differently: Take 5 mg by mouth every evening.), Disp: 90 tablet, Rfl: 1 ?  losartan (COZAAR) 50 MG tablet, Take 1 tablet (50 mg total) by mouth daily., Disp: 30 tablet, Rfl: 2 ?  metFORMIN (GLUCOPHAGE) 500 MG tablet, TAKE 2  TABLETS DAILY WITH BREAKFAST AND 1 TABLET DAILY WITH SUPPER. (Patient taking differently: Take 500-1,000 mg by mouth See admin instructions. Take 1000 mg  by mouth in the morning, then take 500 mg by mouth at dinner per patient), Disp: 270 tablet, Rfl: 1 ?  montelukast (SINGULAIR) 10 MG tablet, Take 1 tablet (10 mg total) by mouth at bedtime., Disp: 90 tablet, Rfl: 1 ?  rOPINIRole (REQUIP) 0.25 MG tablet, Take 1-2 tablets (0.25-0.5 mg total) by mouth at bedtime. (Patient taking differently: Take 0.25 mg by mouth at bedtime.), Disp: 60 tablet, Rfl: 1 ?  rosuvastatin (CRESTOR) 40 MG tablet, Take 1 tablet (40 mg total) by mouth daily. (Patient taking differently: Take 40 mg by mouth every evening.), Disp: 90 tablet, Rfl: 1 ?  senna-docusate (SENOKOT-S) 8.6-50 MG tablet, Take 1 tablet by mouth at bedtime as needed for mild constipation., Disp: , Rfl:  ?  ticagrelor (BRILINTA) 90 MG TABS tablet, Take 1 tablet (90 mg total) by mouth 2 (two) times daily., Disp: 60 tablet, Rfl: 0 ?  [START ON 04/26/2021] clopidogrel (PLAVIX) 75 MG tablet, Take 1 tablet (75 mg total) by mouth daily. HOLD WHILE ON BRILINTA (Patient not taking: Reported on 04/19/2021), Disp: 30 tablet, Rfl: 11  ? ?Allergies  ?Allergen Reactions  ? Asa [Aspirin] Rash  ?  Can take low-dose aspirin.  ? Bempedoic Acid Other (See Comments)  ?  Bilateral shoulder pain.  ? Codeine Rash  ? Penicillins Rash  ?  Has patient had a PCN reaction causing immediate rash, facial/tongue/throat swelling, SOB or lightheadedness with hypotension:Yes ?Has patient had a PCN reaction causing severe rash involving mucus membranes or skin necrosis:Yes ?Has patient had a PCN reaction that required hospitalization:No ?Has patient had a PCN reaction occurring within the last 10 years:No ?If all of the above answers are "NO", then may proceed with Cephalosporin use. ?  ? ?Past Medical History:  ?Diagnosis Date  ? Anxiety   ? Arterial occlusive disease Nov. 2014  ? Arthritis   ? Claudication  of lower extremity [facility]) Nov. 2014  ? Right Lower Extremity rest pain  ? Colon polyps   ? Depression   ? Diabetes mellitus without complication ([place])   ? GERD (gastroesophageal reflux disease)   ? Hyperlipidemia   ? Migraines   ? Vertigo   ?  ?Past Surgical History:  ?Procedure Laterality Date  ? ABDOMINAL AORTAGRAM N/A 01/10/2013  ? Procedure: ABDOMINAL AORTAGRAM;  Surgeon: Rosetta Posner, MD;  Location: [facility] CATH LAB;  Service: Cardiovascular;  Laterality: N/A;  ? ABDOMINAL AORTOGRAM W/LOWER EXTREMITY Bilateral 07/16/2018  ? Procedure: ABDOMINAL AORTOGRAM W/LOWER EXTREMITY;  Surgeon: Angelia Mould, MD;  Location: [place] CV LAB;  Service: Cardiovascular;  Laterality: Bilateral;  ? ABDOMINAL AORTOGRAM W/LOWER EXTREMITY N/A 10/05/2020  ? Procedure: ABDOMINAL AORTOGRAM W/LOWER EXTREMITY;  Surgeon: Angelia Mould, MD;  Location: [place] CV LAB;  Service: Cardiovascular;  Laterality: N/A;  ? BUBBLE STUDY  03/26/2021  ? Procedure:  BUBBLE STUDY;  Surgeon: Donato Heinz, MD;  Location: [place];  Service: Cardiovascular;;  ? CHOLECYSTECTOMY    ? Gall Bladder  ? iliac artery angioplasty and stent placement  01/10/13  ? LOWER EXTREMITY ANGIOGRAM Bilateral 01/10/2013  ? Procedure: LOWER EXTREMITY ANGIOGRAM;  Surgeon: Rosetta Posner, MD;  Location: [facility] CATH LAB;  Service: Cardiovascular;  Laterality: Bilateral;  ? PERCUTANEOUS STENT INTERVENTION Right 01/10/2013  ? Procedure: PERCUTANEOUS STENT INTERVENTION;  Surgeon: Rosetta Posner, MD;  Location: [facility] CATH LAB;  Service: Cardiovascular;  Laterality: Right;  rt common iliac stent  ? PERIPHERAL VASCULAR CATHETERIZATION N/A 11/26/2015  ? Procedure: Abdominal Aortogram w/Lower Extremity;  Surgeon: Angelia Mould, MD;  Location: [place] CV LAB;  Service: Cardiovascular;  Laterality: N/A;  ? PERIPHERAL VASCULAR CATHETERIZATION Right 11/26/2015  ? Procedure: Peripheral Vascular Balloon Angioplasty;  Surgeon: Angelia Mould, MD;  Location: [place] CV LAB;  Service: Cardiovascular;  Laterality: Right;  rt common iliac  ? PERIPHERAL VASCULAR INTERVENTION  07/16/2018  ? Procedure: PERIPHERAL VASCULAR INTERVENTION;  Surgeon: Angelia Mould

## 2021-04-19 NOTE — Therapy (Signed)
?[facility] ?[place] ?[place], [place], [address] ?Phone: [phone]   Fax:  [phone] ? ?Physical Therapy Treatment ? ?Patient Details  ?Name: Hannah Mcdaniel ?MRN: [id number] ?Date of Birth: [birth year] ?Referring Provider (PT): Thereasa Solo, MD ? ? ?Encounter Date: 04/19/2021 ? ? PT End of Session - 04/19/21 1028   ? ? Visit Number 7   ? Number of Visits 16   ? Date for PT Re-Evaluation 06/07/21   ? PT Start Time 1030   ? PT Stop Time 1696   ? PT Time Calculation (min) 42 min   ? Activity Tolerance Patient tolerated treatment well   ? Behavior During Therapy [facility] for tasks assessed/performed   ? ?  ?  ? ?  ? ? ?Past Medical History:  ?Diagnosis Date  ? Anxiety   ? Arterial occlusive disease Nov. 2014  ? Arthritis   ? Claudication of lower extremity [facility]) Nov. 2014  ? Right Lower Extremity rest pain  ? Colon polyps   ? Depression   ? Diabetes mellitus without complication ([place])   ? GERD (gastroesophageal reflux disease)   ? Hyperlipidemia   ? Migraines   ? Vertigo   ? ? ?Past Surgical History:  ?Procedure Laterality Date  ? ABDOMINAL AORTAGRAM N/A 01/10/2013  ? Procedure: ABDOMINAL AORTAGRAM;  Surgeon: Rosetta Posner, MD;  Location: [facility] CATH LAB;  Service: Cardiovascular;  Laterality: N/A;  ? ABDOMINAL AORTOGRAM W/LOWER EXTREMITY Bilateral 07/16/2018  ? Procedure: ABDOMINAL AORTOGRAM W/LOWER EXTREMITY;  Surgeon: Angelia Mould, MD;  Location: [place] CV LAB;  Service: Cardiovascular;  Laterality: Bilateral;  ? ABDOMINAL AORTOGRAM W/LOWER EXTREMITY N/A 10/05/2020  ? Procedure: ABDOMINAL AORTOGRAM W/LOWER EXTREMITY;  Surgeon: Angelia Mould, MD;  Location: [place] CV LAB;  Service: Cardiovascular;  Laterality: N/A;  ? BUBBLE STUDY  03/26/2021  ? Procedure: BUBBLE STUDY;  Surgeon: Donato Heinz, MD;  Location: [place];  Service: Cardiovascular;;  ? CHOLECYSTECTOMY    ? Gall Bladder  ? iliac artery angioplasty and stent placement  01/10/13  ?  LOWER EXTREMITY ANGIOGRAM Bilateral 01/10/2013  ? Procedure: LOWER EXTREMITY ANGIOGRAM;  Surgeon: Rosetta Posner, MD;  Location: [facility] CATH LAB;  Service: Cardiovascular;  Laterality: Bilateral;  ? PERCUTANEOUS STENT INTERVENTION Right 01/10/2013  ? Procedure: PERCUTANEOUS STENT INTERVENTION;  Surgeon: Rosetta Posner, MD;  Location: [facility] CATH LAB;  Service: Cardiovascular;  Laterality: Right;  rt common iliac stent  ? PERIPHERAL VASCULAR CATHETERIZATION N/A 11/26/2015  ? Procedure: Abdominal Aortogram w/Lower Extremity;  Surgeon: Angelia Mould, MD;  Location: [place] CV LAB;  Service: Cardiovascular;  Laterality: N/A;  ? PERIPHERAL VASCULAR CATHETERIZATION Right 11/26/2015  ? Procedure: Peripheral Vascular Balloon Angioplasty;  Surgeon: Angelia Mould, MD;  Location: [place] CV LAB;  Service: Cardiovascular;  Laterality: Right;  rt common iliac  ? PERIPHERAL VASCULAR INTERVENTION  07/16/2018  ? Procedure: PERIPHERAL VASCULAR INTERVENTION;  Surgeon: Angelia Mould, MD;  Location: [place] CV LAB;  Service: Cardiovascular;;  ? PERIPHERAL VASCULAR INTERVENTION Right 10/05/2020  ? Procedure: PERIPHERAL VASCULAR INTERVENTION;  Surgeon: Angelia Mould, MD;  Location: [place] CV LAB;  Service: Cardiovascular;  Laterality: Right;  Common and exteral iliac artery  ? TEE WITHOUT CARDIOVERSION N/A 03/26/2021  ? Procedure: TRANSESOPHAGEAL ECHOCARDIOGRAM (TEE);  Surgeon: Donato Heinz, MD;  Location: [facility] ENDOSCOPY;  Service: Cardiovascular;  Laterality: N/A;  ? TOTAL ABDOMINAL HYSTERECTOMY    ? ? ?Vitals:  ? 04/19/21 1035  ?BP: 138/71  ? ? ?  Subjective Assessment - 04/19/21 1029   ? ? Subjective Patient reports that she feels alright today. She notes that she is able to walk around her house for short distances without her assistive device. She has a follow up with her referring provider after therapy today.   ? Pertinent History history of CVA   ? Limitations Lifting   ? How long can you  stand comfortably? unable to stand without support   ? Patient Stated Goals walk independently and drive   ? Currently in Pain? No/denies   ? ?  ?  ? ?  ? ? ? ? ? ? ? ? ? ? ? ? ? ? ? ? ? ? ? ? [place] Adult PT Treatment/Exercise - 04/19/21 0001   ? ?  ? Knee/Hip Exercises: Aerobic  ? Nustep L7 x 15 minutes   ?  ? Knee/Hip Exercises: Standing  ? Hip Flexion Both;20 reps;Knee bent   ? Hip Flexion Limitations 3 lb ankle weights   ?  ? Knee/Hip Exercises: Seated  ? Long [employer] Both;2 sets;15 reps;Weights   ? Long Arc Quad Weight 3 lbs.   ? Sit to Sand without UE support   ?  ? Shoulder Exercises: Seated  ? Horizontal ABduction Strengthening;Both;20 reps;Theraband   ? Theraband Level (Shoulder Horizontal ABduction) Level 2 (Red)   ? External Rotation Both;20 reps;Theraband   ? Theraband Level (Shoulder External Rotation) Level 2 (Red)   ? Other Seated Exercises Therabar twisting and bending   green; 20 reps each  ? ?  ?  ? ?  ? ? ? ? ? ? Balance Exercises - 04/19/21 0001   ? ?  ? Balance Exercises: Standing  ? Standing Eyes Opened Solid surface;Narrow base of support (BOS);4 reps;30 secs   increased sway within first 5-7 seconds; improved after this time  ? ?  ?  ? ?  ? ? ? ? ? ? ? ? ? ? PT Long Term Goals - 04/17/21 1519   ? ?  ? PT LONG TERM GOAL #1  ? Title Patient will be independent with her HEP.   ? Time 6   ? Period Weeks   ? Status Achieved   ? Target Date 05/13/21   ?  ? PT LONG TERM GOAL #2  ? Title Patient will be able to safely ambulate at least 80 feet with a cane or least restrictive device for improved household mobility.   ? Baseline 04/10/21: 66 ft x 2 with SPC, left foot slight drag; 3/15: rolling walker with gait deviations   ? Time 6   ? Period Weeks   ? Status On-going   ? Target Date 05/13/21   ?  ? PT LONG TERM GOAL #3  ? Title Patient will improve her grip strength of her left hand to at least 30 pounds for improved functional use of her LUE.   ? Baseline 04/10/21: 35 pounds 3/15: 25 pounds   ? Time 6    ? Period Weeks   ? Status On-going   ? Target Date 05/13/21   ?  ? PT LONG TERM GOAL #4  ? Title Patient will improve her 5x sit to stand time to 12 seconds or less for improved safety and reduced fall risk.   ? Baseline 04/10/21: 18.97 seconds 3/15: 16 seconds   ? Time 6   ? Period Weeks   ? Status Revised   ? Target Date 05/13/21   ? ?  ?  ? ?  ? ? ? ? ? ? ? ?  Plan - 04/19/21 1028   ? ? Clinical Impression Statement Patient was reindroduced to familiar interventions with moderate difficulty and fatigue. She required minimal cuing with pacing to avoid a significant increase in fatigue. Fatigue was her primary limitation with today's interventions as she required seated rest breaks throughout treatment. Upper and lower extremity interventions were alternated to avoid a significant increase in fatigue. She reported feeling a little tired upon the conclusion of treatment. She continues to require skilled physical therapy to address her remaining impairments to maximize her safety and mobility.   ? Personal Factors and Comorbidities Transportation;Comorbidity 3+   ? Comorbidities Multiple CVA's, PAD, DM, smoker,   ? Examination-Activity Limitations Locomotion Level;Transfers;Bathing;Caring for Others;Carry;Hygiene/Grooming   ? Examination-Participation Restrictions Meal Prep;Cleaning;Community Activity;Shop;Driving;Yard Work;Laundry   ? Stability/Clinical Decision Making Evolving/Moderate complexity   ? Rehab Potential Good   ? PT Frequency 3x / week   2-3x / week  ? PT Duration 6 weeks   ? PT Treatment/Interventions ADLs/Self Care Home Management;Electrical Stimulation;Gait training;Stair training;Functional mobility training;Therapeutic activities;Therapeutic exercise;Balance training;Neuromuscular re-education;Patient/family education;Energy conservation;Taping   ? PT Next Visit Plan monitor BP: nustep (upper and lower extremities), upper and lower extremity strengthening, gait training   ? Consulted and Agree with  Plan of Care Patient   ? ?  ?  ? ?  ? ? ?Patient will benefit from skilled therapeutic intervention in order to improve the following deficits and impairments:  Abnormal gait, Difficulty walking, Impai

## 2021-04-22 ENCOUNTER — Ambulatory Visit: Payer: 59

## 2021-04-22 ENCOUNTER — Other Ambulatory Visit: Payer: Self-pay

## 2021-04-22 DIAGNOSIS — M6281 Muscle weakness (generalized): Secondary | ICD-10-CM | POA: Diagnosis not present

## 2021-04-22 DIAGNOSIS — R262 Difficulty in walking, not elsewhere classified: Secondary | ICD-10-CM | POA: Diagnosis not present

## 2021-04-22 NOTE — Therapy (Signed)
[place] [facility] ?[place] ?[place], [place], [address] ?Phone: [phone]   Fax:  [phone] ? ?Physical Therapy Treatment ? ?Patient Details  ?Name: Hannah Mcdaniel ?MRN: [id number] ?Date of Birth: 03-Jul-1965 ?Referring Provider (PT): Thereasa Solo, MD ? ? ?Encounter Date: 04/22/2021 ? ? PT End of Session - 04/22/21 1029   ? ? Visit Number 8   ? Number of Visits 16   ? Date for PT Re-Evaluation 06/07/21   ? PT Start Time 1030   ? PT Stop Time 1115   ? PT Time Calculation (min) 45 min   ? Activity Tolerance Patient tolerated treatment well   ? Behavior During Therapy [facility] for tasks assessed/performed   ? ?  ?  ? ?  ? ? ?Past Medical History:  ?Diagnosis Date  ? Anxiety   ? Arterial occlusive disease Nov. 2014  ? Arthritis   ? Claudication of lower extremity [facility]) Nov. 2014  ? Right Lower Extremity rest pain  ? Colon polyps   ? Depression   ? Diabetes mellitus without complication ([place])   ? GERD (gastroesophageal reflux disease)   ? Hyperlipidemia   ? Migraines   ? Vertigo   ? ? ?Past Surgical History:  ?Procedure Laterality Date  ? ABDOMINAL AORTAGRAM N/A 01/10/2013  ? Procedure: ABDOMINAL AORTAGRAM;  Surgeon: Rosetta Posner, MD;  Location: [facility] CATH LAB;  Service: Cardiovascular;  Laterality: N/A;  ? ABDOMINAL AORTOGRAM W/LOWER EXTREMITY Bilateral 07/16/2018  ? Procedure: ABDOMINAL AORTOGRAM W/LOWER EXTREMITY;  Surgeon: Angelia Mould, MD;  Location: [place] CV LAB;  Service: Cardiovascular;  Laterality: Bilateral;  ? ABDOMINAL AORTOGRAM W/LOWER EXTREMITY N/A 10/05/2020  ? Procedure: ABDOMINAL AORTOGRAM W/LOWER EXTREMITY;  Surgeon: Angelia Mould, MD;  Location: [place] CV LAB;  Service: Cardiovascular;  Laterality: N/A;  ? BUBBLE STUDY  03/26/2021  ? Procedure: BUBBLE STUDY;  Surgeon: Donato Heinz, MD;  Location: [place];  Service: Cardiovascular;;  ? CHOLECYSTECTOMY    ? Gall Bladder  ? iliac artery angioplasty and stent placement  01/10/13  ?  LOWER EXTREMITY ANGIOGRAM Bilateral 01/10/2013  ? Procedure: LOWER EXTREMITY ANGIOGRAM;  Surgeon: Rosetta Posner, MD;  Location: [facility] CATH LAB;  Service: Cardiovascular;  Laterality: Bilateral;  ? PERCUTANEOUS STENT INTERVENTION Right 01/10/2013  ? Procedure: PERCUTANEOUS STENT INTERVENTION;  Surgeon: Rosetta Posner, MD;  Location: [facility] CATH LAB;  Service: Cardiovascular;  Laterality: Right;  rt common iliac stent  ? PERIPHERAL VASCULAR CATHETERIZATION N/A 11/26/2015  ? Procedure: Abdominal Aortogram w/Lower Extremity;  Surgeon: Angelia Mould, MD;  Location: [place] CV LAB;  Service: Cardiovascular;  Laterality: N/A;  ? PERIPHERAL VASCULAR CATHETERIZATION Right 11/26/2015  ? Procedure: Peripheral Vascular Balloon Angioplasty;  Surgeon: Angelia Mould, MD;  Location: [place] CV LAB;  Service: Cardiovascular;  Laterality: Right;  rt common iliac  ? PERIPHERAL VASCULAR INTERVENTION  07/16/2018  ? Procedure: PERIPHERAL VASCULAR INTERVENTION;  Surgeon: Angelia Mould, MD;  Location: [place] CV LAB;  Service: Cardiovascular;;  ? PERIPHERAL VASCULAR INTERVENTION Right 10/05/2020  ? Procedure: PERIPHERAL VASCULAR INTERVENTION;  Surgeon: Angelia Mould, MD;  Location: [place] CV LAB;  Service: Cardiovascular;  Laterality: Right;  Common and exteral iliac artery  ? TEE WITHOUT CARDIOVERSION N/A 03/26/2021  ? Procedure: TRANSESOPHAGEAL ECHOCARDIOGRAM (TEE);  Surgeon: Donato Heinz, MD;  Location: [facility] ENDOSCOPY;  Service: Cardiovascular;  Laterality: N/A;  ? TOTAL ABDOMINAL HYSTERECTOMY    ? ? ?There were no vitals filed for this visit. ? ?  Subjective Assessment - 04/22/21 1029   ? ? Subjective Pt arrives for today's treatment session denying any pain.  Pt states that she ambulated around her yard yesterday with AD, no pain or LOB, but increase fatigued.   ? Pertinent History history of CVA   ? Limitations Lifting   ? How long can you stand comfortably? unable to stand without support    ? Patient Stated Goals walk independently and drive   ? Currently in Pain? No/denies   ? ?  ?  ? ?  ? ? ? ? ? ? ? ? ? ? ? ? ? ? ? ? ? ? ? ? [place] Adult PT Treatment/Exercise - 04/22/21 0001   ? ?  ? Knee/Hip Exercises: Aerobic  ? Nustep Lvl 7 x 15 mins   ?  ? Knee/Hip Exercises: Standing  ? Heel Raises Both;20 reps;5 reps   ? Hip Flexion Both;20 reps;Knee bent   ? Hip Flexion Limitations 2#   ? Hip ADduction Strengthening;Both;20 reps   Cues to avoid lean  ? Hip ADduction Limitations 2#   ? Hip Extension Stengthening;Both;20 reps;Knee straight   Cues to avoid lean  ? Extension Limitations 2#   ?  ? Knee/Hip Exercises: Seated  ? Long [employer] Both;2 sets;15 reps;Weights   ? Long Arc Quad Weight 3 lbs.   ? Clamshell with TheraBand Green   2 mins  ? Marching Strengthening;Both;20 reps;Weights   ? Marching Weights 3 lbs.   ? Hamstring Curl Both;20 reps   ? Hamstring Limitations green tband   ? Sit to Sand without UE support;20 reps   ?  ? Shoulder Exercises: Seated  ? Extension Strengthening;Both;20 reps;Theraband;5 reps   ? Theraband Level (Shoulder Extension) Level 3 (Green)   ? Row Strengthening;Both;20 reps;Theraband;5 reps   ? Theraband Level (Shoulder Row) Level 3 (Green)   ? Horizontal ABduction Strengthening;Both;20 reps;Theraband   ? Theraband Level (Shoulder Horizontal ABduction) Level 3 (Green)   ? External Rotation Both;20 reps;Theraband   ? Theraband Level (Shoulder External Rotation) Level 3 (Green)   ? Internal Rotation Strengthening;Both;20 reps;Theraband   ? Flexion Strengthening;Both;20 reps   ? Flexion Weight (lbs) 2#   ? Abduction Strengthening;Both;20 reps;Weights   ? ABduction Weight (lbs) 2#   ? Other Seated Exercises Therabar twisting and bending   ? ?  ?  ? ?  ? ? ? ? ? ? ? ? ? ? ? ? ? ? ? PT Long Term Goals - 04/17/21 1519   ? ?  ? PT LONG TERM GOAL #1  ? Title Patient will be independent with her HEP.   ? Time 6   ? Period Weeks   ? Status Achieved   ? Target Date 05/13/21   ?  ? PT LONG TERM  GOAL #2  ? Title Patient will be able to safely ambulate at least 80 feet with a cane or least restrictive device for improved household mobility.   ? Baseline 04/10/21: 66 ft x 2 with SPC, left foot slight drag; 3/15: rolling walker with gait deviations   ? Time 6   ? Period Weeks   ? Status On-going   ? Target Date 05/13/21   ?  ? PT LONG TERM GOAL #3  ? Title Patient will improve her grip strength of her left hand to at least 30 pounds for improved functional use of her LUE.   ? Baseline 04/10/21: 35 pounds 3/15: 25 pounds   ? Time 6   ?  Period Weeks   ? Status On-going   ? Target Date 05/13/21   ?  ? PT LONG TERM GOAL #4  ? Title Patient will improve her 5x sit to stand time to 12 seconds or less for improved safety and reduced fall risk.   ? Baseline 04/10/21: 18.97 seconds 3/15: 16 seconds   ? Time 6   ? Period Weeks   ? Status Revised   ? Target Date 05/13/21   ? ?  ?  ? ?  ? ? ? ? ? ? ? ? Plan - 04/22/21 1029   ? ? Clinical Impression Statement Pt arrives for today's treatment session denying any pain.  Pt states that she was able to ambulate around her yard yesterday with RW and no LOB.  Pt able to tolerate increased resistance with all resisted LE and UE exercises.  Pt requiring decreased rest breaks in between exercises.  Pt works very hard and pushes herself to get better.  Pt denied any pain at completion of today's treatment session, but does endorse increased fatigue.   ? Personal Factors and Comorbidities Transportation;Comorbidity 3+   ? Comorbidities Multiple CVA's, PAD, DM, smoker,   ? Examination-Activity Limitations Locomotion Level;Transfers;Bathing;Caring for Others;Carry;Hygiene/Grooming   ? Examination-Participation Restrictions Meal Prep;Cleaning;Community Activity;Shop;Driving;Yard Work;Laundry   ? Stability/Clinical Decision Making Evolving/Moderate complexity   ? Rehab Potential Good   ? PT Frequency 3x / week   2-3x / week  ? PT Duration 6 weeks   ? PT Treatment/Interventions ADLs/Self Care  Home Management;Electrical Stimulation;Gait training;Stair training;Functional mobility training;Therapeutic activities;Therapeutic exercise;Balance training;Neuromuscular re-education;Patient/family ed

## 2021-04-23 ENCOUNTER — Ambulatory Visit: Payer: 59 | Admitting: Adult Health

## 2021-04-23 ENCOUNTER — Encounter: Payer: Self-pay | Admitting: Adult Health

## 2021-04-23 VITALS — BP 140/68 | HR 83 | Ht 62.0 in | Wt 137.0 lb

## 2021-04-23 DIAGNOSIS — E785 Hyperlipidemia, unspecified: Secondary | ICD-10-CM

## 2021-04-23 DIAGNOSIS — I6930 Unspecified sequelae of cerebral infarction: Secondary | ICD-10-CM | POA: Diagnosis not present

## 2021-04-23 DIAGNOSIS — I10 Essential (primary) hypertension: Secondary | ICD-10-CM | POA: Diagnosis not present

## 2021-04-23 NOTE — Patient Instructions (Signed)
? ? ? ? ? ? ?  [facility], [address] [place], [place]  [phone] or [phone]:  Richelle Ito, OT/CDRS/CDI/SCDCM/Low Vision Certification ?

## 2021-04-23 NOTE — Progress Notes (Signed)
? ? ?PATIENT: Hannah Mcdaniel ?DOB: 12/22/1965 ? ?REASON FOR VISIT: follow up ?HISTORY FROM: patient ?PRIMARY NEUROLOGIST:  ? ?HISTORY OF PRESENT ILLNESS: ?Today 04/23/21: ? ?HISTORY: ?Hannah Mcdaniel is a 56 year old female with a history of type 2 diabetes mellitus, hypertension, hyperlipidemia, migraine headaches, GERD, tobacco use, peripheral vascular disease on Plavix.  Presented to the ED on February 18 due to sudden onset left leg weakness and decreased sensation in the left arm and leg.  She did receive TNKase.  ? ?MRI of the brain showed acute ischemic infarcts involving the bilateral basal ganglia, right larger than left.  Diffuse ventricular prominence at the portion to cortical sulcation, few small remote cerebellar infarcts, underlying mild chronic microvascular ischemic disease ? ?CTA head and Neck:no large vessel stenosis ? ?2D echo LVEF 55% ?TEE EF 55-60%, left atrial size mildly dilated, bubble study negative ?LDL 162-currently on Zetia 10 mg, fenofibrate 135 mg, Repatha and Crestor 40 mg while in hospital ?Hemoglobin A1c 6.2 on metformin  ? ?Patient was on aspirin 81 mg daily and Brilinta 90 mg twice a day prior to admission.  She remained on that while in the hospital. ? ?This was her third admission d/t stroke ? ?Today- ? ?She reports that she was  started on Praluent waiting on ins approval. Continues on Zetia, Crestor, fenofibrate. She stopped smoking. No longer drinking ETOH, was wearing a cardiac monitor but when she was admitted to the hospital they removed it- has a follow-up with Cardiology on April 7th- will discuss monitor vs. Loop recorder then.  ? ?Since discharge continues to have  left sided weakness. Currently in PT 3x a week. [facility]. No trouble with chewing or swallowing food. No slurred speech. Using a walker currently and trying to transition to cane. ? ?Currently on Brilinta and ASA. Will switch to plavix and ASA 3/24. ? ? ?REVIEW OF SYSTEMS: Out of  a complete 14 system review of symptoms, the patient complains only of the following symptoms, and all other reviewed systems are negative. ? ?ALLERGIES: ?Allergies  ?Allergen Reactions  ? Asa [Aspirin] Rash  ?  Can take low-dose aspirin.  ? Bempedoic Acid Other (See Comments)  ?  Bilateral shoulder pain.  ? Codeine Rash  ? Penicillins Rash  ?  Has patient had a PCN reaction causing immediate rash, facial/tongue/throat swelling, SOB or lightheadedness with hypotension:Yes ?Has patient had a PCN reaction causing severe rash involving mucus membranes or skin necrosis:Yes ?Has patient had a PCN reaction that required hospitalization:No ?Has patient had a PCN reaction occurring within the last 10 years:No ?If all of the above answers are "NO", then may proceed with Cephalosporin use. ?  ? ? ?HOME MEDICATIONS: ?Outpatient Medications Prior to Visit  ?Medication Sig Dispense Refill  ? aspirin EC 81 MG tablet Take 81 mg by mouth daily.    ? Continuous Blood Gluc Receiver (FREESTYLE LIBRE 2 READER) DEVI 1 Device by Does not apply route continuous. 1 each 0  ? Continuous Blood Gluc Sensor (FREESTYLE LIBRE 2 SENSOR) MISC 1 Device by Does not apply route every 14 (fourteen) days. 2 each 5  ? ezetimibe (ZETIA) 10 MG tablet Take 1 tablet (10 mg total) by mouth daily. 90 tablet 1  ? FLUoxetine (PROZAC) 40 MG capsule Take 1 capsule (40 mg total) by mouth daily. 90 capsule 1  ? levocetirizine (XYZAL) 5 MG tablet TAKE 1 TABLET BY MOUTH EVERY DAY IN THE EVENING (Patient taking differently: Take 5 mg  by mouth every evening.) 90 tablet 1  ? losartan (COZAAR) 50 MG tablet Take 1 tablet (50 mg total) by mouth daily. 30 tablet 2  ? metFORMIN (GLUCOPHAGE) 500 MG tablet TAKE 2 TABLETS DAILY WITH BREAKFAST AND 1 TABLET DAILY WITH SUPPER. (Patient taking differently: Take 500-1,000 mg by mouth See admin instructions. Take 1000 mg  by mouth in the morning, then take 500 mg by mouth at dinner per patient) 270 tablet 1  ? mirtazapine (REMERON)  7.5 MG tablet Take 1 tablet (7.5 mg total) by mouth at bedtime. 30 tablet 2  ? montelukast (SINGULAIR) 10 MG tablet Take 1 tablet (10 mg total) by mouth at bedtime. 90 tablet 1  ? rOPINIRole (REQUIP) 0.25 MG tablet Take 1-2 tablets (0.25-0.5 mg total) by mouth at bedtime. (Patient taking differently: Take 0.25 mg by mouth at bedtime.) 60 tablet 1  ? rosuvastatin (CRESTOR) 40 MG tablet Take 1 tablet (40 mg total) by mouth daily. (Patient taking differently: Take 40 mg by mouth every evening.) 90 tablet 1  ? senna-docusate (SENOKOT-S) 8.6-50 MG tablet Take 1 tablet by mouth at bedtime as needed for mild constipation.    ? ticagrelor (BRILINTA) 90 MG TABS tablet Take 1 tablet (90 mg total) by mouth 2 (two) times daily. 60 tablet 0  ? Alirocumab (PRALUENT) 75 MG/ML SOAJ Inject 75 mg into the skin every 14 (fourteen) days. (Patient not taking: Reported on 04/23/2021) 2 mL 1  ? [START ON 04/26/2021] clopidogrel (PLAVIX) 75 MG tablet Take 1 tablet (75 mg total) by mouth daily. HOLD WHILE ON BRILINTA (Patient not taking: Reported on 04/19/2021) 30 tablet 11  ? ?No facility-administered medications prior to visit.  ? ? ?PAST MEDICAL HISTORY: ?Past Medical History:  ?Diagnosis Date  ? Anxiety   ? Arterial occlusive disease Nov. 2014  ? Arthritis   ? Claudication of lower extremity [facility]) Nov. 2014  ? Right Lower Extremity rest pain  ? Colon polyps   ? Depression   ? Diabetes mellitus without complication ([place])   ? GERD (gastroesophageal reflux disease)   ? Hyperlipidemia   ? Migraines   ? Vertigo   ? ? ?PAST SURGICAL HISTORY: ?Past Surgical History:  ?Procedure Laterality Date  ? ABDOMINAL AORTAGRAM N/A 01/10/2013  ? Procedure: ABDOMINAL AORTAGRAM;  Surgeon: Rosetta Posner, MD;  Location: [facility] CATH LAB;  Service: Cardiovascular;  Laterality: N/A;  ? ABDOMINAL AORTOGRAM W/LOWER EXTREMITY Bilateral 07/16/2018  ? Procedure: ABDOMINAL AORTOGRAM W/LOWER EXTREMITY;  Surgeon: Angelia Mould, MD;  Location: [place] CV LAB;   Service: Cardiovascular;  Laterality: Bilateral;  ? ABDOMINAL AORTOGRAM W/LOWER EXTREMITY N/A 10/05/2020  ? Procedure: ABDOMINAL AORTOGRAM W/LOWER EXTREMITY;  Surgeon: Angelia Mould, MD;  Location: [place] CV LAB;  Service: Cardiovascular;  Laterality: N/A;  ? BUBBLE STUDY  03/26/2021  ? Procedure: BUBBLE STUDY;  Surgeon: Donato Heinz, MD;  Location: [place];  Service: Cardiovascular;;  ? CHOLECYSTECTOMY    ? Gall Bladder  ? iliac artery angioplasty and stent placement  01/10/13  ? LOWER EXTREMITY ANGIOGRAM Bilateral 01/10/2013  ? Procedure: LOWER EXTREMITY ANGIOGRAM;  Surgeon: Rosetta Posner, MD;  Location: [facility] CATH LAB;  Service: Cardiovascular;  Laterality: Bilateral;  ? PERCUTANEOUS STENT INTERVENTION Right 01/10/2013  ? Procedure: PERCUTANEOUS STENT INTERVENTION;  Surgeon: Rosetta Posner, MD;  Location: [facility] CATH LAB;  Service: Cardiovascular;  Laterality: Right;  rt common iliac stent  ? PERIPHERAL VASCULAR CATHETERIZATION N/A 11/26/2015  ? Procedure: Abdominal Aortogram w/Lower Extremity;  Surgeon: Harrell Gave  Nicole Cella, MD;  Location: [place] CV LAB;  Service: Cardiovascular;  Laterality: N/A;  ? PERIPHERAL VASCULAR CATHETERIZATION Right 11/26/2015  ? Procedure: Peripheral Vascular Balloon Angioplasty;  Surgeon: Angelia Mould, MD;  Location: [facility] CV LAB;  Service: Cardiovascular;  Laterality: Right;  rt common iliac  ? PERIPHERAL VASCULAR INTERVENTION  07/16/2018  ? Procedure: PERIPHERAL VASCULAR INTERVENTION;  Surgeon: Angelia Mould, MD;  Location: [place] CV LAB;  Service: Cardiovascular;;  ? PERIPHERAL VASCULAR INTERVENTION Right 10/05/2020  ? Procedure: PERIPHERAL VASCULAR INTERVENTION;  Surgeon: Angelia Mould, MD;  Location: [place] CV LAB;  Service: Cardiovascular;  Laterality: Right;  Common and exteral iliac artery  ? TEE WITHOUT CARDIOVERSION N/A 03/26/2021  ? Procedure: TRANSESOPHAGEAL ECHOCARDIOGRAM (TEE);  Surgeon: Donato Heinz,  MD;  Location: [facility] ENDOSCOPY;  Service: Cardiovascular;  Laterality: N/A;  ? TOTAL ABDOMINAL HYSTERECTOMY    ? ? ?FAMILY HISTORY: ?Family History  ?Problem Relation Age of Onset  ? Diabetes Mother   ? Hyperl

## 2021-04-24 ENCOUNTER — Ambulatory Visit: Payer: 59

## 2021-04-24 ENCOUNTER — Other Ambulatory Visit: Payer: Self-pay

## 2021-04-24 DIAGNOSIS — M6281 Muscle weakness (generalized): Secondary | ICD-10-CM

## 2021-04-24 DIAGNOSIS — R262 Difficulty in walking, not elsewhere classified: Secondary | ICD-10-CM | POA: Diagnosis not present

## 2021-04-24 NOTE — Therapy (Signed)
[facility] ?[place] ?[place], [place], [address] ?Phone: [phone]   Fax:  [phone] ? ?Physical Therapy Treatment ? ?Patient Details  ?Name: Hannah Mcdaniel ?MRN: [id number] ?Date of Birth: [birth year] ?Referring Provider (PT): Thereasa Solo, MD ? ? ?Encounter Date: 04/24/2021 ? ? PT End of Session - 04/24/21 1038   ? ? Visit Number 9   ? Number of Visits 16   ? Date for PT Re-Evaluation 06/07/21   ? PT Start Time 1030   ? PT Stop Time 3474   ? PT Time Calculation (min) 42 min   ? Activity Tolerance Patient tolerated treatment well   ? Behavior During Therapy [facility] for tasks assessed/performed   ? ?  ?  ? ?  ? ? ?Past Medical History:  ?Diagnosis Date  ? Anxiety   ? Arterial occlusive disease Nov. 2014  ? Arthritis   ? Claudication of lower extremity [facility]) Nov. 2014  ? Right Lower Extremity rest pain  ? Colon polyps   ? Depression   ? Diabetes mellitus without complication ([place])   ? GERD (gastroesophageal reflux disease)   ? Hyperlipidemia   ? Migraines   ? Vertigo   ? ? ?Past Surgical History:  ?Procedure Laterality Date  ? ABDOMINAL AORTAGRAM N/A 01/10/2013  ? Procedure: ABDOMINAL AORTAGRAM;  Surgeon: Rosetta Posner, MD;  Location: [facility] CATH LAB;  Service: Cardiovascular;  Laterality: N/A;  ? ABDOMINAL AORTOGRAM W/LOWER EXTREMITY Bilateral 07/16/2018  ? Procedure: ABDOMINAL AORTOGRAM W/LOWER EXTREMITY;  Surgeon: Angelia Mould, MD;  Location: [place] CV LAB;  Service: Cardiovascular;  Laterality: Bilateral;  ? ABDOMINAL AORTOGRAM W/LOWER EXTREMITY N/A 10/05/2020  ? Procedure: ABDOMINAL AORTOGRAM W/LOWER EXTREMITY;  Surgeon: Angelia Mould, MD;  Location: [place] CV LAB;  Service: Cardiovascular;  Laterality: N/A;  ? BUBBLE STUDY  03/26/2021  ? Procedure: BUBBLE STUDY;  Surgeon: Donato Heinz, MD;  Location: [place];  Service: Cardiovascular;;  ? CHOLECYSTECTOMY    ? Gall Bladder  ? iliac artery angioplasty and stent placement  01/10/13  ?  LOWER EXTREMITY ANGIOGRAM Bilateral 01/10/2013  ? Procedure: LOWER EXTREMITY ANGIOGRAM;  Surgeon: Rosetta Posner, MD;  Location: [facility] CATH LAB;  Service: Cardiovascular;  Laterality: Bilateral;  ? PERCUTANEOUS STENT INTERVENTION Right 01/10/2013  ? Procedure: PERCUTANEOUS STENT INTERVENTION;  Surgeon: Rosetta Posner, MD;  Location: [facility] CATH LAB;  Service: Cardiovascular;  Laterality: Right;  rt common iliac stent  ? PERIPHERAL VASCULAR CATHETERIZATION N/A 11/26/2015  ? Procedure: Abdominal Aortogram w/Lower Extremity;  Surgeon: Angelia Mould, MD;  Location: [place] CV LAB;  Service: Cardiovascular;  Laterality: N/A;  ? PERIPHERAL VASCULAR CATHETERIZATION Right 11/26/2015  ? Procedure: Peripheral Vascular Balloon Angioplasty;  Surgeon: Angelia Mould, MD;  Location: [place] CV LAB;  Service: Cardiovascular;  Laterality: Right;  rt common iliac  ? PERIPHERAL VASCULAR INTERVENTION  07/16/2018  ? Procedure: PERIPHERAL VASCULAR INTERVENTION;  Surgeon: Angelia Mould, MD;  Location: [place] CV LAB;  Service: Cardiovascular;;  ? PERIPHERAL VASCULAR INTERVENTION Right 10/05/2020  ? Procedure: PERIPHERAL VASCULAR INTERVENTION;  Surgeon: Angelia Mould, MD;  Location: [place] CV LAB;  Service: Cardiovascular;  Laterality: Right;  Common and exteral iliac artery  ? TEE WITHOUT CARDIOVERSION N/A 03/26/2021  ? Procedure: TRANSESOPHAGEAL ECHOCARDIOGRAM (TEE);  Surgeon: Donato Heinz, MD;  Location: [facility] ENDOSCOPY;  Service: Cardiovascular;  Laterality: N/A;  ? TOTAL ABDOMINAL HYSTERECTOMY    ? ? ?There were no vitals filed for this visit. ? ?  Subjective Assessment - 04/24/21 1034   ? ? Subjective Pt arrives for today's treatment session repoting 5/10 left knee pain.  Pt states that she had to do a lot of walking yesterday when going to the doc to get her AFO for her left ankle.   ? Pertinent History history of CVA   ? Limitations Lifting   ? How long can you stand comfortably? unable to  stand without support   ? Patient Stated Goals walk independently and drive   ? Currently in Pain? Yes   ? Pain Score 5    ? Pain Location Knee   ? Pain Orientation Left   ? ?  ?  ? ?  ? ? ? ? ? ? ? ? ? ? ? ? ? ? ? ? ? ? ? ? [place] Adult PT Treatment/Exercise - 04/24/21 0001   ? ?  ? Exercises  ? Exercises Shoulder;Ankle;Knee/Hip   ?  ? Knee/Hip Exercises: Aerobic  ? Nustep Lvl 7-8 x 15 mins   ?  ? Knee/Hip Exercises: Standing  ? Heel Raises Both;20 reps;5 reps   ? Hip Flexion Both;20 reps;Knee bent   ? Hip Flexion Limitations 2#   ? Hip ADduction Strengthening;Both;20 reps   ? Hip ADduction Limitations 2#   ? Hip Extension Stengthening;Both;20 reps;Knee straight   ? Extension Limitations 2#   ? Walking with Sports Cord Blue XTS, forward x 5 reps, LOB x 1   ?  ? Shoulder Exercises: Seated  ? Extension Strengthening;Both;20 reps;Theraband;5 reps   ? Theraband Level (Shoulder Extension) Level 3 (Green)   ? Row Strengthening;Both;20 reps;Theraband;5 reps   ? Theraband Level (Shoulder Row) Level 3 (Green)   ? Horizontal ABduction Strengthening;Both;20 reps;Theraband   ? Theraband Level (Shoulder Horizontal ABduction) Level 3 (Green)   ? External Rotation Both;20 reps;Theraband   ? Theraband Level (Shoulder External Rotation) Level 3 (Green)   ? Other Seated Exercises Therabar twisting and bending, green, x 20   ?  ? Shoulder Exercises: ROM/Strengthening  ? UBE (Upper Arm Bike) 6 mins 90 RPM (forward/backward)   ?  ? Ankle Exercises: Seated  ? Other Seated Ankle Exercises Dorsiflexion and plantarflexion x 20 with red tband   ? ?  ?  ? ?  ? ? ? ? ? ? ? ? ? ? ? ? ? ? ? PT Long Term Goals - 04/24/21 1038   ? ?  ? PT LONG TERM GOAL #1  ? Status Achieved   ?  ? PT LONG TERM GOAL #2  ? Status On-going   ?  ? PT LONG TERM GOAL #3  ? Status On-going   ?  ? PT LONG TERM GOAL #4  ? Status On-going   ? ?  ?  ? ?  ? ? ? ? ? ? ? ? Plan - 04/24/21 1038   ? ? Clinical Impression Statement Pt arrives for today's treatment session  reporting 5/10 left knee pain.  Pt does not know origin of pain, possibly attributed to the increase amount of ambulation she had yesterday going to the doctor.  Pt able to tolerate addition of resisted walk out with blue XTS band.  Pt able to ambulate forward x 5 reps with LOB x 1 requiring minA +1 to correct.  Pt able to ambulate around the gym with [facility] without unsteadiness or LOB.  Pt reported 5/10 left knee pain at completion of today's treatment session.   ? Personal Factors and Comorbidities Transportation;Comorbidity  3+   ? Comorbidities Multiple CVA's, PAD, DM, smoker,   ? Examination-Activity Limitations Locomotion Level;Transfers;Bathing;Caring for Others;Carry;Hygiene/Grooming   ? Examination-Participation Restrictions Meal Prep;Cleaning;Community Activity;Shop;Driving;Yard Work;Laundry   ? Stability/Clinical Decision Making Evolving/Moderate complexity   ? Rehab Potential Good   ? PT Frequency 3x / week   2-3x / week  ? PT Duration 6 weeks   ? PT Treatment/Interventions ADLs/Self Care Home Management;Electrical Stimulation;Gait training;Stair training;Functional mobility training;Therapeutic activities;Therapeutic exercise;Balance training;Neuromuscular re-education;Patient/family education;Energy conservation;Taping   ? PT Next Visit Plan monitor BP: nustep (upper and lower extremities), upper and lower extremity strengthening, gait training   ? Consulted and Agree with Plan of Care Patient   ? ?  ?  ? ?  ? ? ?Patient will benefit from skilled therapeutic intervention in order to improve the following deficits and impairments:  Abnormal gait, Difficulty walking, Impaired tone, Impaired UE functional use, Decreased activity tolerance, Decreased balance, Impaired sensation, Decreased strength, Decreased mobility ? ?Visit Diagnosis: ?Muscle weakness (generalized) ? ?Difficulty in walking, not elsewhere classified ? ? ? ? ?Problem List ?Patient Active Problem List  ? Diagnosis Date Noted  ? Malnutrition of  moderate degree 04/13/2021  ? Essential hypertension 04/11/2021  ? CVA (cerebral vascular accident) ([place]) 03/26/2021  ? Acute CVA (cerebrovascular accident) ([place]) 03/25/2021  ? Depression with anxiety 02/20/202

## 2021-04-25 NOTE — Progress Notes (Signed)
I agree with the above plan 

## 2021-04-26 ENCOUNTER — Ambulatory Visit: Payer: 59

## 2021-04-26 ENCOUNTER — Other Ambulatory Visit: Payer: Self-pay

## 2021-04-26 ENCOUNTER — Ambulatory Visit: Payer: 59 | Admitting: Family Medicine

## 2021-04-26 DIAGNOSIS — M6281 Muscle weakness (generalized): Secondary | ICD-10-CM | POA: Diagnosis not present

## 2021-04-26 DIAGNOSIS — R262 Difficulty in walking, not elsewhere classified: Secondary | ICD-10-CM | POA: Diagnosis not present

## 2021-04-26 NOTE — Therapy (Addendum)
[facility] ?[place] ?[place], [place], [address] ?Phone: [phone]   Fax:  [phone] ? ?Physical Therapy Treatment ? ?Patient Details  ?Name: Hannah Mcdaniel ?MRN: [id number] ?Date of Birth: 1965-10-13 ?Referring Provider (PT): Thereasa Solo, MD ? ? ?Encounter Date: 04/26/2021 ? ? PT End of Session - 04/26/21 0919   ? ? Visit Number 10   ? Number of Visits 16   ? Date for PT Re-Evaluation 06/07/21   ? PT Start Time 2952   ? PT Stop Time 1014   ? PT Time Calculation (min) 54 min   ? Activity Tolerance Patient tolerated treatment well   ? Behavior During Therapy [facility] for tasks assessed/performed   ? ?  ?  ? ?  ? ? ?Past Medical History:  ?Diagnosis Date  ? Anxiety   ? Arterial occlusive disease Nov. 2014  ? Arthritis   ? Claudication of lower extremity [facility]) Nov. 2014  ? Right Lower Extremity rest pain  ? Colon polyps   ? Depression   ? Diabetes mellitus without complication ([place])   ? GERD (gastroesophageal reflux disease)   ? Hyperlipidemia   ? Migraines   ? Vertigo   ? ? ?Past Surgical History:  ?Procedure Laterality Date  ? ABDOMINAL AORTAGRAM N/A 01/10/2013  ? Procedure: ABDOMINAL AORTAGRAM;  Surgeon: Rosetta Posner, MD;  Location: [facility] CATH LAB;  Service: Cardiovascular;  Laterality: N/A;  ? ABDOMINAL AORTOGRAM W/LOWER EXTREMITY Bilateral 07/16/2018  ? Procedure: ABDOMINAL AORTOGRAM W/LOWER EXTREMITY;  Surgeon: Angelia Mould, MD;  Location: [place] CV LAB;  Service: Cardiovascular;  Laterality: Bilateral;  ? ABDOMINAL AORTOGRAM W/LOWER EXTREMITY N/A 10/05/2020  ? Procedure: ABDOMINAL AORTOGRAM W/LOWER EXTREMITY;  Surgeon: Angelia Mould, MD;  Location: [place] CV LAB;  Service: Cardiovascular;  Laterality: N/A;  ? BUBBLE STUDY  03/26/2021  ? Procedure: BUBBLE STUDY;  Surgeon: Donato Heinz, MD;  Location: [place];  Service: Cardiovascular;;  ? CHOLECYSTECTOMY    ? Gall Bladder  ? iliac artery angioplasty and stent placement  01/10/13  ?  LOWER EXTREMITY ANGIOGRAM Bilateral 01/10/2013  ? Procedure: LOWER EXTREMITY ANGIOGRAM;  Surgeon: Rosetta Posner, MD;  Location: [facility] CATH LAB;  Service: Cardiovascular;  Laterality: Bilateral;  ? PERCUTANEOUS STENT INTERVENTION Right 01/10/2013  ? Procedure: PERCUTANEOUS STENT INTERVENTION;  Surgeon: Rosetta Posner, MD;  Location: [facility] CATH LAB;  Service: Cardiovascular;  Laterality: Right;  rt common iliac stent  ? PERIPHERAL VASCULAR CATHETERIZATION N/A 11/26/2015  ? Procedure: Abdominal Aortogram w/Lower Extremity;  Surgeon: Angelia Mould, MD;  Location: [place] CV LAB;  Service: Cardiovascular;  Laterality: N/A;  ? PERIPHERAL VASCULAR CATHETERIZATION Right 11/26/2015  ? Procedure: Peripheral Vascular Balloon Angioplasty;  Surgeon: Angelia Mould, MD;  Location: [place] CV LAB;  Service: Cardiovascular;  Laterality: Right;  rt common iliac  ? PERIPHERAL VASCULAR INTERVENTION  07/16/2018  ? Procedure: PERIPHERAL VASCULAR INTERVENTION;  Surgeon: Angelia Mould, MD;  Location: [place] CV LAB;  Service: Cardiovascular;;  ? PERIPHERAL VASCULAR INTERVENTION Right 10/05/2020  ? Procedure: PERIPHERAL VASCULAR INTERVENTION;  Surgeon: Angelia Mould, MD;  Location: [place] CV LAB;  Service: Cardiovascular;  Laterality: Right;  Common and exteral iliac artery  ? TEE WITHOUT CARDIOVERSION N/A 03/26/2021  ? Procedure: TRANSESOPHAGEAL ECHOCARDIOGRAM (TEE);  Surgeon: Donato Heinz, MD;  Location: [facility] ENDOSCOPY;  Service: Cardiovascular;  Laterality: N/A;  ? TOTAL ABDOMINAL HYSTERECTOMY    ? ? ?There were no vitals filed for this visit. ? ?  Subjective Assessment - 04/26/21 0918   ? ? Subjective Pt arrives for today's treatment reporting 2/10 left knee pain.  Pt states that she has been walking around at home without an AD.   ? Pertinent History history of CVA   ? Limitations Lifting   ? How long can you stand comfortably? unable to stand without support   ? Patient Stated Goals walk  independently and drive   ? Currently in Pain? Yes   ? Pain Score 2    ? Pain Location Knee   ? Pain Orientation Left   ? ?  ?  ? ?  ? ? ? ? ? ? ? ? ? ? ? ? ? ? ? ? ? ? ? ? [place] Adult PT Treatment/Exercise - 04/26/21 0001   ? ?  ? Knee/Hip Exercises: Aerobic  ? Nustep Lvl 8-9 x 15 mins   ?  ? Knee/Hip Exercises: Standing  ? Hip Flexion Both;20 reps;Knee bent;10 reps   ? Hip Flexion Limitations 2#   ? Hip ADduction Strengthening;Both;20 reps;5 reps   ? Hip ADduction Limitations 2#   ? Hip Extension Stengthening;Both;10 reps;20 reps;Knee straight   ? Extension Limitations 2#   ? Walking with Sports Cord Blue XTS; forward/backward/left/right x 5 reps   CGA/minA +1 throughout, seate rest break halfway through  ? Gait Training 82 ft x 2 with SPC   ?  ? Knee/Hip Exercises: Seated  ? Sit to Sand 20 reps;5 reps;without UE support   ?  ? Shoulder Exercises: ROM/Strengthening  ? UBE (Upper Arm Bike) 8 mins (foward/backward)   ? ?  ?  ? ?  ? ? ? ? ? ? ? ? ? ? ? ? ? ? ? PT Long Term Goals - 04/26/21 0919   ? ?  ? PT LONG TERM GOAL #1  ? Title Patient will be independent with her HEP.   ? Time 6   ? Period Weeks   ? Status Achieved   ? Target Date 05/13/21   ?  ? PT LONG TERM GOAL #2  ? Title Patient will be able to safely ambulate at least 80 feet with a cane or least restrictive device for improved household mobility.   ? Baseline 04/10/21: 66 ft x 2 with SPC, left foot slight drag; 3/15: rolling walker with gait deviations; 04/26/21: 82 ft x 2 with SPC mild left foot drag last 15 ft   ? Time 6   ? Period Weeks   ? Status Partially Met   ? Target Date 05/13/21   ?  ? PT LONG TERM GOAL #3  ? Title Patient will improve her grip strength of her left hand to at least 30 pounds for improved functional use of her LUE.   ? Baseline 04/10/21: 35 pounds 3/15: 25 pounds; 04/26/21: 29 pounds   ? Time 6   ? Period Weeks   ? Status On-going   ? Target Date 05/13/21   ?  ? PT LONG TERM GOAL #4  ? Title Patient will improve her 5x sit to stand  time to 12 seconds or less for improved safety and reduced fall risk.   ? Baseline 04/10/21: 18.97 seconds 3/15: 16 seconds; 04/26/21: 16 seconds   ? Time 6   ? Period Weeks   ? Status Revised   ? Target Date 05/13/21   ? ?  ?  ? ?  ? ? ? ? ? ? ? ? Plan - 04/26/21 0919   ? ?  Clinical Impression Statement Pt arrives for today's treatment session reporting 2/10 left knee pain. Pt is making progress towards all of her goals at this time.  Pt was able to demonstrate 29 pounds of grip strength on her left hand today.  Pt able to ambulate 82 ft x 2 with SPC.  Pt's left ankle with tendency to roll into inversion, but pt has ordered an AFO to assist with correction.  During last 15 ft of ambulation pt demonstrated fatigue in left foot with decreased foot clearance.  Pt reported increased fatigue at completion of today's treatment session.   ? Personal Factors and Comorbidities Transportation;Comorbidity 3+   ? Comorbidities Multiple CVA's, PAD, DM, smoker,   ? Examination-Activity Limitations Locomotion Level;Transfers;Bathing;Caring for Others;Carry;Hygiene/Grooming   ? Examination-Participation Restrictions Meal Prep;Cleaning;Community Activity;Shop;Driving;Yard Work;Laundry   ? Stability/Clinical Decision Making Evolving/Moderate complexity   ? Rehab Potential Good   ? PT Frequency 3x / week   2-3x / week  ? PT Duration 6 weeks   ? PT Treatment/Interventions ADLs/Self Care Home Management;Electrical Stimulation;Gait training;Stair training;Functional mobility training;Therapeutic activities;Therapeutic exercise;Balance training;Neuromuscular re-education;Patient/family education;Energy conservation;Taping   ? PT Next Visit Plan monitor BP: nustep (upper and lower extremities), upper and lower extremity strengthening, gait training   ? Consulted and Agree with Plan of Care Patient   ? ?  ?  ? ?  ? ? ?Patient will benefit from skilled therapeutic intervention in order to improve the following deficits and impairments:   Abnormal gait, Difficulty walking, Impaired tone, Impaired UE functional use, Decreased activity tolerance, Decreased balance, Impaired sensation, Decreased strength, Decreased mobility ? ?Visit Diagnosis: ?Muscl

## 2021-04-27 ENCOUNTER — Other Ambulatory Visit: Payer: Self-pay | Admitting: Family Medicine

## 2021-04-27 DIAGNOSIS — G2581 Restless legs syndrome: Secondary | ICD-10-CM

## 2021-04-29 ENCOUNTER — Ambulatory Visit: Payer: 59

## 2021-04-29 ENCOUNTER — Other Ambulatory Visit: Payer: Self-pay

## 2021-04-29 VITALS — BP 102/61

## 2021-04-29 DIAGNOSIS — R262 Difficulty in walking, not elsewhere classified: Secondary | ICD-10-CM

## 2021-04-29 DIAGNOSIS — M6281 Muscle weakness (generalized): Secondary | ICD-10-CM

## 2021-04-29 NOTE — Therapy (Signed)
[place] [facility] ?[address] ?[place], [place], [address] ?Phone: [phone]   Fax:  [phone] ? ?Physical Therapy Treatment ? ?Patient Details  ?Name: Hannah Mcdaniel ?MRN: [id number] ?Date of Birth: 06/13/1965 ?Referring Provider (PT): Thereasa Solo, MD ? ? ?Encounter Date: 04/29/2021 ? ? PT End of Session - 04/29/21 1028   ? ? Visit Number 11   ? Number of Visits 16   ? Date for PT Re-Evaluation 06/07/21   ? PT Start Time 1025   ? PT Stop Time 1117   ? PT Time Calculation (min) 52 min   ? Activity Tolerance Patient tolerated treatment well   ? Behavior During Therapy [facility] for tasks assessed/performed   ? ?  ?  ? ?  ? ? ?Past Medical History:  ?Diagnosis Date  ? Anxiety   ? Arterial occlusive disease Nov. 2014  ? Arthritis   ? Claudication of lower extremity [facility]) Nov. 2014  ? Right Lower Extremity rest pain  ? Colon polyps   ? Depression   ? Diabetes mellitus without complication ([place])   ? GERD (gastroesophageal reflux disease)   ? Hyperlipidemia   ? Migraines   ? Vertigo   ? ? ?Past Surgical History:  ?Procedure Laterality Date  ? ABDOMINAL AORTAGRAM N/A 01/10/2013  ? Procedure: ABDOMINAL AORTAGRAM;  Surgeon: Rosetta Posner, MD;  Location: [facility] CATH LAB;  Service: Cardiovascular;  Laterality: N/A;  ? ABDOMINAL AORTOGRAM W/LOWER EXTREMITY Bilateral 07/16/2018  ? Procedure: ABDOMINAL AORTOGRAM W/LOWER EXTREMITY;  Surgeon: Angelia Mould, MD;  Location: [place] CV LAB;  Service: Cardiovascular;  Laterality: Bilateral;  ? ABDOMINAL AORTOGRAM W/LOWER EXTREMITY N/A 10/05/2020  ? Procedure: ABDOMINAL AORTOGRAM W/LOWER EXTREMITY;  Surgeon: Angelia Mould, MD;  Location: [place] CV LAB;  Service: Cardiovascular;  Laterality: N/A;  ? BUBBLE STUDY  03/26/2021  ? Procedure: BUBBLE STUDY;  Surgeon: Donato Heinz, MD;  Location: [place];  Service: Cardiovascular;;  ? CHOLECYSTECTOMY    ? Gall Bladder  ? iliac artery angioplasty and stent placement  01/10/13  ?  LOWER EXTREMITY ANGIOGRAM Bilateral 01/10/2013  ? Procedure: LOWER EXTREMITY ANGIOGRAM;  Surgeon: Rosetta Posner, MD;  Location: [facility] CATH LAB;  Service: Cardiovascular;  Laterality: Bilateral;  ? PERCUTANEOUS STENT INTERVENTION Right 01/10/2013  ? Procedure: PERCUTANEOUS STENT INTERVENTION;  Surgeon: Rosetta Posner, MD;  Location: [facility] CATH LAB;  Service: Cardiovascular;  Laterality: Right;  rt common iliac stent  ? PERIPHERAL VASCULAR CATHETERIZATION N/A 11/26/2015  ? Procedure: Abdominal Aortogram w/Lower Extremity;  Surgeon: Angelia Mould, MD;  Location: [place] CV LAB;  Service: Cardiovascular;  Laterality: N/A;  ? PERIPHERAL VASCULAR CATHETERIZATION Right 11/26/2015  ? Procedure: Peripheral Vascular Balloon Angioplasty;  Surgeon: Angelia Mould, MD;  Location: [place] CV LAB;  Service: Cardiovascular;  Laterality: Right;  rt common iliac  ? PERIPHERAL VASCULAR INTERVENTION  07/16/2018  ? Procedure: PERIPHERAL VASCULAR INTERVENTION;  Surgeon: Angelia Mould, MD;  Location: [place] CV LAB;  Service: Cardiovascular;;  ? PERIPHERAL VASCULAR INTERVENTION Right 10/05/2020  ? Procedure: PERIPHERAL VASCULAR INTERVENTION;  Surgeon: Angelia Mould, MD;  Location: [place] CV LAB;  Service: Cardiovascular;  Laterality: Right;  Common and exteral iliac artery  ? TEE WITHOUT CARDIOVERSION N/A 03/26/2021  ? Procedure: TRANSESOPHAGEAL ECHOCARDIOGRAM (TEE);  Surgeon: Donato Heinz, MD;  Location: [facility] ENDOSCOPY;  Service: Cardiovascular;  Laterality: N/A;  ? TOTAL ABDOMINAL HYSTERECTOMY    ? ? ?Vitals:  ? 04/29/21 1052  ?BP: 102/61  ? ? ?  Subjective Assessment - 04/29/21 1027   ? ? Subjective Pt arrives for today's treatment session denying any pain.  Pt reports that she ambulated around her house and yard with her [facility] over the weekend.  No reporte LOB.   ? Pertinent History history of CVA   ? Limitations Lifting   ? How long can you stand comfortably? unable to stand without support    ? Patient Stated Goals walk independently and drive   ? Currently in Pain? No/denies   ? ?  ?  ? ?  ? ? ? ? ? ? ? ? ? ? ? ? ? ? ? ? ? ? ? ? [place] Adult PT Treatment/Exercise - 04/29/21 0001   ? ?  ? Knee/Hip Exercises: Aerobic  ? Nustep Lvl 8-9 x 15 mins   ?  ? Knee/Hip Exercises: Standing  ? Walking with Sports Cord Blue XTS; forward/backward/left/right x 5 reps   CGA/minA +1, seated rest break halfway through  ? Gait Training 82 ft x 3 with no AD   CGA, left foot clearance decreased  ?  ? Knee/Hip Exercises: Seated  ? Long [employer] Strengthening;Both;Weights   ? Long Arc Quad Weight 4 lbs.   ? [employer] Limitations 2 mins   ? Marching Strengthening;Both;Weights   ? Marching Limitations 2 mins   ? Marching Weights 4 lbs.   ? Hamstring Curl Both;20 reps;10 reps   ? Hamstring Limitations green tband   ?  ? Shoulder Exercises: ROM/Strengthening  ? UBE (Upper Arm Bike) 10 mins (forward/backward)   ? ?  ?  ? ?  ? ? ? ? ? ? ? ? ? ? ? ? ? ? ? PT Long Term Goals - 04/26/21 0919   ? ?  ? PT LONG TERM GOAL #1  ? Title Patient will be independent with her HEP.   ? Time 6   ? Period Weeks   ? Status Achieved   ? Target Date 05/13/21   ?  ? PT LONG TERM GOAL #2  ? Title Patient will be able to safely ambulate at least 80 feet with a cane or least restrictive device for improved household mobility.   ? Baseline 04/10/21: 66 ft x 2 with SPC, left foot slight drag; 3/15: rolling walker with gait deviations; 04/26/21: 82 ft x 2 with SPC mild left foot drag last 15 ft   ? Time 6   ? Period Weeks   ? Status Partially Met   ? Target Date 05/13/21   ?  ? PT LONG TERM GOAL #3  ? Title Patient will improve her grip strength of her left hand to at least 30 pounds for improved functional use of her LUE.   ? Baseline 04/10/21: 35 pounds 3/15: 25 pounds; 04/26/21: 29 pounds   ? Time 6   ? Period Weeks   ? Status On-going   ? Target Date 05/13/21   ?  ? PT LONG TERM GOAL #4  ? Title Patient will improve her 5x sit to stand time to 12 seconds or  less for improved safety and reduced fall risk.   ? Baseline 04/10/21: 18.97 seconds 3/15: 16 seconds; 04/26/21: 16 seconds   ? Time 6   ? Period Weeks   ? Status Revised   ? Target Date 05/13/21   ? ?  ?  ? ?  ? ? ? ? ? ? ? ? Plan - 04/29/21 1028   ? ? Clinical Impression  Statement Pt arrives for today's treatment session denying any pain.  Pt states that she was able to ambulate throughout the yard with her [facility] over the weekend with her husband present.  Pt denies any LOB.  Pt able to ambulate 82 ft x 3 without use of AD.  Pt with tendency to drag left foot, demonstrating decreased foot clearance on the left.  Pt reports one bout of dizziness with BP at 102/61.  Dizziness passed with seated rest break.  Pt reported 2/10 left knee pain at completion of today's treatment session.   ? Personal Factors and Comorbidities Transportation;Comorbidity 3+   ? Comorbidities Multiple CVA's, PAD, DM, smoker,   ? Examination-Activity Limitations Locomotion Level;Transfers;Bathing;Caring for Others;Carry;Hygiene/Grooming   ? Examination-Participation Restrictions Meal Prep;Cleaning;Community Activity;Shop;Driving;Yard Work;Laundry   ? Stability/Clinical Decision Making Evolving/Moderate complexity   ? Rehab Potential Good   ? PT Frequency 3x / week   2-3x / week  ? PT Duration 6 weeks   ? PT Treatment/Interventions ADLs/Self Care Home Management;Electrical Stimulation;Gait training;Stair training;Functional mobility training;Therapeutic activities;Therapeutic exercise;Balance training;Neuromuscular re-education;Patient/family education;Energy conservation;Taping   ? PT Next Visit Plan monitor BP: nustep (upper and lower extremities), upper and lower extremity strengthening, gait training   ? Consulted and Agree with Plan of Care Patient   ? ?  ?  ? ?  ? ? ?Patient will benefit from skilled therapeutic intervention in order to improve the following deficits and impairments:  Abnormal gait, Difficulty walking, Impaired tone, Impaired  UE functional use, Decreased activity tolerance, Decreased balance, Impaired sensation, Decreased strength, Decreased mobility ? ?Visit Diagnosis: ?Muscle weakness (generalized) ? ?Difficulty in walk

## 2021-05-03 ENCOUNTER — Ambulatory Visit: Payer: 59

## 2021-05-03 DIAGNOSIS — M6281 Muscle weakness (generalized): Secondary | ICD-10-CM

## 2021-05-03 DIAGNOSIS — R262 Difficulty in walking, not elsewhere classified: Secondary | ICD-10-CM | POA: Diagnosis not present

## 2021-05-03 NOTE — Therapy (Signed)
[place] ?[facility] ?[address] ?[place], [place], [address] ?Phone: [phone]   Fax:  [phone] ? ?Physical Therapy Treatment ? ?Patient Details  ?Name: Hannah Mcdaniel ?MRN: [id number] ?Date of Birth: Jul 24, 1965 ?Referring Provider (PT): Thereasa Solo, MD ? ? ?Encounter Date: 05/03/2021 ? ? PT End of Session - 05/03/21 1040   ? ? Visit Number 12   ? Number of Visits 16   ? Date for PT Re-Evaluation 06/07/21   ? PT Start Time 1030   ? PT Stop Time 1114   ? PT Time Calculation (min) 44 min   ? Activity Tolerance Patient tolerated treatment well   ? Behavior During Therapy [facility] for tasks assessed/performed   ? ?  ?  ? ?  ? ? ?Past Medical History:  ?Diagnosis Date  ? Anxiety   ? Arterial occlusive disease Nov. 2014  ? Arthritis   ? Claudication of lower extremity [facility]) Nov. 2014  ? Right Lower Extremity rest pain  ? Colon polyps   ? Depression   ? Diabetes mellitus without complication ([place])   ? GERD (gastroesophageal reflux disease)   ? Hyperlipidemia   ? Migraines   ? Vertigo   ? ? ?Past Surgical History:  ?Procedure Laterality Date  ? ABDOMINAL AORTAGRAM N/A 01/10/2013  ? Procedure: ABDOMINAL AORTAGRAM;  Surgeon: Rosetta Posner, MD;  Location: [facility] CATH LAB;  Service: Cardiovascular;  Laterality: N/A;  ? ABDOMINAL AORTOGRAM W/LOWER EXTREMITY Bilateral 07/16/2018  ? Procedure: ABDOMINAL AORTOGRAM W/LOWER EXTREMITY;  Surgeon: Angelia Mould, MD;  Location: [place] CV LAB;  Service: Cardiovascular;  Laterality: Bilateral;  ? ABDOMINAL AORTOGRAM W/LOWER EXTREMITY N/A 10/05/2020  ? Procedure: ABDOMINAL AORTOGRAM W/LOWER EXTREMITY;  Surgeon: Angelia Mould, MD;  Location: [place] CV LAB;  Service: Cardiovascular;  Laterality: N/A;  ? BUBBLE STUDY  03/26/2021  ? Procedure: BUBBLE STUDY;  Surgeon: Donato Heinz, MD;  Location: [place];  Service: Cardiovascular;;  ? CHOLECYSTECTOMY    ? Gall Bladder  ? iliac artery angioplasty and stent placement  01/10/13  ?  LOWER EXTREMITY ANGIOGRAM Bilateral 01/10/2013  ? Procedure: LOWER EXTREMITY ANGIOGRAM;  Surgeon: Rosetta Posner, MD;  Location: [facility] CATH LAB;  Service: Cardiovascular;  Laterality: Bilateral;  ? PERCUTANEOUS STENT INTERVENTION Right 01/10/2013  ? Procedure: PERCUTANEOUS STENT INTERVENTION;  Surgeon: Rosetta Posner, MD;  Location: [facility] CATH LAB;  Service: Cardiovascular;  Laterality: Right;  rt common iliac stent  ? PERIPHERAL VASCULAR CATHETERIZATION N/A 11/26/2015  ? Procedure: Abdominal Aortogram w/Lower Extremity;  Surgeon: Angelia Mould, MD;  Location: [place] CV LAB;  Service: Cardiovascular;  Laterality: N/A;  ? PERIPHERAL VASCULAR CATHETERIZATION Right 11/26/2015  ? Procedure: Peripheral Vascular Balloon Angioplasty;  Surgeon: Angelia Mould, MD;  Location: [place] CV LAB;  Service: Cardiovascular;  Laterality: Right;  rt common iliac  ? PERIPHERAL VASCULAR INTERVENTION  07/16/2018  ? Procedure: PERIPHERAL VASCULAR INTERVENTION;  Surgeon: Angelia Mould, MD;  Location: [place] CV LAB;  Service: Cardiovascular;;  ? PERIPHERAL VASCULAR INTERVENTION Right 10/05/2020  ? Procedure: PERIPHERAL VASCULAR INTERVENTION;  Surgeon: Angelia Mould, MD;  Location: [place] CV LAB;  Service: Cardiovascular;  Laterality: Right;  Common and exteral iliac artery  ? TEE WITHOUT CARDIOVERSION N/A 03/26/2021  ? Procedure: TRANSESOPHAGEAL ECHOCARDIOGRAM (TEE);  Surgeon: Donato Heinz, MD;  Location: [facility] ENDOSCOPY;  Service: Cardiovascular;  Laterality: N/A;  ? TOTAL ABDOMINAL HYSTERECTOMY    ? ? ?There were no vitals filed for this visit. ? ?  Subjective Assessment - 05/03/21 1039   ? ? Subjective Pt arrives for today's treatment session denying any pain.  Pt states she missed her last treatment session due to stomach issues.  Pt reports that she has been ambulating around the house without an AD without issue.   ? Pertinent History history of CVA   ? Limitations Lifting   ? How long can  you stand comfortably? unable to stand without support   ? Patient Stated Goals walk independently and drive   ? Currently in Pain? No/denies   ? ?  ?  ? ?  ? ? ? ? ? ? ? ? ? ? ? ? ? ? ? ? ? ? ? ? [place] Adult PT Treatment/Exercise - 05/03/21 0001   ? ?  ? Knee/Hip Exercises: Aerobic  ? Nustep Lvl 8-9 x 15 mins   ?  ? Knee/Hip Exercises: Standing  ? Hip Flexion Stengthening;Both;20 reps;10 reps;Knee bent   ? Hip Flexion Limitations 3#   ? Hip ADduction Strengthening;Both   ? Hip ADduction Limitations 3#   ? Gait Training 82 ft x 4 with SPC and left foot stepping over obstacles   LOB x 2 requiring minA +1 to correct, visible fatigue during completion of 4 lap, contact made with 2 obstacles  ? ?  ?  ? ?  ? ? ? ? ? ? ? ? ? ? ? ? ? ? ? PT Long Term Goals - 04/26/21 0919   ? ?  ? PT LONG TERM GOAL #1  ? Title Patient will be independent with her HEP.   ? Time 6   ? Period Weeks   ? Status Achieved   ? Target Date 05/13/21   ?  ? PT LONG TERM GOAL #2  ? Title Patient will be able to safely ambulate at least 80 feet with a cane or least restrictive device for improved household mobility.   ? Baseline 04/10/21: 66 ft x 2 with SPC, left foot slight drag; 3/15: rolling walker with gait deviations; 04/26/21: 82 ft x 2 with SPC mild left foot drag last 15 ft   ? Time 6   ? Period Weeks   ? Status Partially Met   ? Target Date 05/13/21   ?  ? PT LONG TERM GOAL #3  ? Title Patient will improve her grip strength of her left hand to at least 30 pounds for improved functional use of her LUE.   ? Baseline 04/10/21: 35 pounds 3/15: 25 pounds; 04/26/21: 29 pounds   ? Time 6   ? Period Weeks   ? Status On-going   ? Target Date 05/13/21   ?  ? PT LONG TERM GOAL #4  ? Title Patient will improve her 5x sit to stand time to 12 seconds or less for improved safety and reduced fall risk.   ? Baseline 04/10/21: 18.97 seconds 3/15: 16 seconds; 04/26/21: 16 seconds   ? Time 6   ? Period Weeks   ? Status Revised   ? Target Date 05/13/21   ? ?  ?  ? ?   ? ? ? ? ? ? ? ? Plan - 05/03/21 1041   ? ? Clinical Impression Statement Pt arrives for today's treatment session denying any pain.  Pt states that she had to cancel her last session due to stomach issues.  Pt able to demonstrate increased steadiness with ambulation during today's session.  Pt able to ambulate 82 ft x 4 with  use of SPC.  Obstacles set up to increase left foot clearance.  Pt only making contact with two obstacles during ambulation.  Pt with LOB x 2 requiring minA +1 to correct.  Pt reporting increased fatigue at completion of today's treatment session.  Pt eager to get AFO on 4/11.   ? Personal Factors and Comorbidities Transportation;Comorbidity 3+   ? Comorbidities Multiple CVA's, PAD, DM, smoker,   ? Examination-Activity Limitations Locomotion Level;Transfers;Bathing;Caring for Others;Carry;Hygiene/Grooming   ? Examination-Participation Restrictions Meal Prep;Cleaning;Community Activity;Shop;Driving;Yard Work;Laundry   ? Stability/Clinical Decision Making Evolving/Moderate complexity   ? Rehab Potential Good   ? PT Frequency 3x / week   2-3x / week  ? PT Duration 6 weeks   ? PT Treatment/Interventions ADLs/Self Care Home Management;Electrical Stimulation;Gait training;Stair training;Functional mobility training;Therapeutic activities;Therapeutic exercise;Balance training;Neuromuscular re-education;Patient/family education;Energy conservation;Taping   ? PT Next Visit Plan monitor BP: nustep (upper and lower extremities), upper and lower extremity strengthening, gait training   ? Consulted and Agree with Plan of Care Patient   ? ?  ?  ? ?  ? ? ?Patient will benefit from skilled therapeutic intervention in order to improve the following deficits and impairments:  Abnormal gait, Difficulty walking, Impaired tone, Impaired UE functional use, Decreased activity tolerance, Decreased balance, Impaired sensation, Decreased strength, Decreased mobility ? ?Visit Diagnosis: ?Muscle weakness  (generalized) ? ?Difficulty in walking, not elsewhere classified ? ? ? ? ?Problem List ?Patient Active Problem List  ? Diagnosis Date Noted  ? Malnutrition of moderate degree 04/13/2021  ? Essential hypertension 04/11/2021  ?

## 2021-05-07 ENCOUNTER — Ambulatory Visit: Payer: 59 | Attending: Internal Medicine

## 2021-05-07 ENCOUNTER — Other Ambulatory Visit: Payer: Self-pay | Admitting: Student

## 2021-05-07 DIAGNOSIS — I639 Cerebral infarction, unspecified: Secondary | ICD-10-CM

## 2021-05-07 DIAGNOSIS — R262 Difficulty in walking, not elsewhere classified: Secondary | ICD-10-CM | POA: Diagnosis not present

## 2021-05-07 DIAGNOSIS — M6281 Muscle weakness (generalized): Secondary | ICD-10-CM | POA: Diagnosis not present

## 2021-05-07 DIAGNOSIS — I4891 Unspecified atrial fibrillation: Secondary | ICD-10-CM

## 2021-05-07 NOTE — Therapy (Signed)
[place] [facility] ?[place] ?[place], [place], [address] ?Phone: [phone]   Fax:  [phone] ? ?Physical Therapy Treatment ? ?Patient Details  ?Name: Hannah Mcdaniel ?MRN: [id number] ?Date of Birth: 12-02-1965 ?Referring Provider (PT): Thereasa Solo, MD ? ? ?Encounter Date: 05/07/2021 ? ? PT End of Session - 05/07/21 0953   ? ? Visit Number 13   ? Number of Visits 16   ? Date for PT Re-Evaluation 06/07/21   ? PT Start Time [phone]   ? PT Stop Time 5498   ? PT Time Calculation (min) 41 min   ? Activity Tolerance Patient tolerated treatment well   ? Behavior During Therapy [facility] for tasks assessed/performed   ? ?  ?  ? ?  ? ? ?Past Medical History:  ?Diagnosis Date  ? Anxiety   ? Arterial occlusive disease Nov. 2014  ? Arthritis   ? Claudication of lower extremity [facility]) Nov. 2014  ? Right Lower Extremity rest pain  ? Colon polyps   ? Depression   ? Diabetes mellitus without complication ([place])   ? GERD (gastroesophageal reflux disease)   ? Hyperlipidemia   ? Migraines   ? Vertigo   ? ? ?Past Surgical History:  ?Procedure Laterality Date  ? ABDOMINAL AORTAGRAM N/A 01/10/2013  ? Procedure: ABDOMINAL AORTAGRAM;  Surgeon: Rosetta Posner, MD;  Location: [facility] CATH LAB;  Service: Cardiovascular;  Laterality: N/A;  ? ABDOMINAL AORTOGRAM W/LOWER EXTREMITY Bilateral 07/16/2018  ? Procedure: ABDOMINAL AORTOGRAM W/LOWER EXTREMITY;  Surgeon: Angelia Mould, MD;  Location: [place] CV LAB;  Service: Cardiovascular;  Laterality: Bilateral;  ? ABDOMINAL AORTOGRAM W/LOWER EXTREMITY N/A 10/05/2020  ? Procedure: ABDOMINAL AORTOGRAM W/LOWER EXTREMITY;  Surgeon: Angelia Mould, MD;  Location: [place] CV LAB;  Service: Cardiovascular;  Laterality: N/A;  ? BUBBLE STUDY  03/26/2021  ? Procedure: BUBBLE STUDY;  Surgeon: Donato Heinz, MD;  Location: [place];  Service: Cardiovascular;;  ? CHOLECYSTECTOMY    ? Gall Bladder  ? iliac artery angioplasty and stent placement  01/10/13  ?  LOWER EXTREMITY ANGIOGRAM Bilateral 01/10/2013  ? Procedure: LOWER EXTREMITY ANGIOGRAM;  Surgeon: Rosetta Posner, MD;  Location: [facility] CATH LAB;  Service: Cardiovascular;  Laterality: Bilateral;  ? PERCUTANEOUS STENT INTERVENTION Right 01/10/2013  ? Procedure: PERCUTANEOUS STENT INTERVENTION;  Surgeon: Rosetta Posner, MD;  Location: [facility] CATH LAB;  Service: Cardiovascular;  Laterality: Right;  rt common iliac stent  ? PERIPHERAL VASCULAR CATHETERIZATION N/A 11/26/2015  ? Procedure: Abdominal Aortogram w/Lower Extremity;  Surgeon: Angelia Mould, MD;  Location: [place] CV LAB;  Service: Cardiovascular;  Laterality: N/A;  ? PERIPHERAL VASCULAR CATHETERIZATION Right 11/26/2015  ? Procedure: Peripheral Vascular Balloon Angioplasty;  Surgeon: Angelia Mould, MD;  Location: [place] CV LAB;  Service: Cardiovascular;  Laterality: Right;  rt common iliac  ? PERIPHERAL VASCULAR INTERVENTION  07/16/2018  ? Procedure: PERIPHERAL VASCULAR INTERVENTION;  Surgeon: Angelia Mould, MD;  Location: [place] CV LAB;  Service: Cardiovascular;;  ? PERIPHERAL VASCULAR INTERVENTION Right 10/05/2020  ? Procedure: PERIPHERAL VASCULAR INTERVENTION;  Surgeon: Angelia Mould, MD;  Location: [place] CV LAB;  Service: Cardiovascular;  Laterality: Right;  Common and exteral iliac artery  ? TEE WITHOUT CARDIOVERSION N/A 03/26/2021  ? Procedure: TRANSESOPHAGEAL ECHOCARDIOGRAM (TEE);  Surgeon: Donato Heinz, MD;  Location: [facility] ENDOSCOPY;  Service: Cardiovascular;  Laterality: N/A;  ? TOTAL ABDOMINAL HYSTERECTOMY    ? ? ?There were no vitals filed for this visit. ? ?  Subjective Assessment - 05/07/21 0953   ? ? Subjective Patient reports that she is feeling good today.   ? Pertinent History history of CVA   ? Limitations Lifting   ? How long can you stand comfortably? unable to stand without support   ? Patient Stated Goals walk independently and drive   ? Currently in Pain? No/denies   ? ?  ?  ? ?   ? ? ? ? ? ? ? ? ? ? ? ? ? ? ? ? ? ? ? ? [place] Adult PT Treatment/Exercise - 05/07/21 0001   ? ?  ? Knee/Hip Exercises: Aerobic  ? Nustep L9-10 x 15 minutes   ?  ? Knee/Hip Exercises: Seated  ? Long [employer] Strengthening;Both;Weights;2 sets;15 reps   ? Long Arc Quad Weight 5 lbs.   ? Hamstring Curl Both;2 sets   1 minute each  ? Hamstring Limitations green t-band   ?  ? Shoulder Exercises: Seated  ? External Rotation Both;20 reps;Theraband   ? Theraband Level (Shoulder External Rotation) Level 3 (Green)   ?  ? Shoulder Exercises: Standing  ? Row Both;20 reps;10 reps   unsupported  ? Row Weight (lbs) Blue XTS   ? ?  ?  ? ?  ? ? ? ? ? ? Balance Exercises - 05/07/21 0001   ? ?  ? Balance Exercises: Standing  ? Step Ups Forward;UE support 2   onto BOSU (ball up)  ? Other Standing Exercises BOSU weight shift   lateral; 2 minutes  ? ?  ?  ? ?  ? ? ? ? ? ? ? ? ? ? PT Long Term Goals - 04/26/21 0919   ? ?  ? PT LONG TERM GOAL #1  ? Title Patient will be independent with her HEP.   ? Time 6   ? Period Weeks   ? Status Achieved   ? Target Date 05/13/21   ?  ? PT LONG TERM GOAL #2  ? Title Patient will be able to safely ambulate at least 80 feet with a cane or least restrictive device for improved household mobility.   ? Baseline 04/10/21: 66 ft x 2 with SPC, left foot slight drag; 3/15: rolling walker with gait deviations; 04/26/21: 82 ft x 2 with SPC mild left foot drag last 15 ft   ? Time 6   ? Period Weeks   ? Status Partially Met   ? Target Date 05/13/21   ?  ? PT LONG TERM GOAL #3  ? Title Patient will improve her grip strength of her left hand to at least 30 pounds for improved functional use of her LUE.   ? Baseline 04/10/21: 35 pounds 3/15: 25 pounds; 04/26/21: 29 pounds   ? Time 6   ? Period Weeks   ? Status On-going   ? Target Date 05/13/21   ?  ? PT LONG TERM GOAL #4  ? Title Patient will improve her 5x sit to stand time to 12 seconds or less for improved safety and reduced fall risk.   ? Baseline 04/10/21: 18.97 seconds  3/15: 16 seconds; 04/26/21: 16 seconds   ? Time 6   ? Period Weeks   ? Status Revised   ? Target Date 05/13/21   ? ?  ?  ? ?  ? ? ? ? ? ? ? ? Plan - 05/07/21 0955   ? ? Clinical Impression Statement Patient was progressed with multiple new and familiar interventions  for improved lower extremity strength and stability with moderate difficulty and fatigue. Muscular fatigue was her primary limitation with today's interventions with seated hamstring curls being the most difficult. Upper extremity interventions were utilized throughout treatment to maintain proper biomechanics with lower extremity interventions. She reported feeling fatigued, but good upon the conclusion of treatment. She continues to require skilled physical therapy to address her remaining impairments to maximize her functional mobility.   ? Personal Factors and Comorbidities Transportation;Comorbidity 3+   ? Comorbidities Multiple CVA's, PAD, DM, smoker,   ? Examination-Activity Limitations Locomotion Level;Transfers;Bathing;Caring for Others;Carry;Hygiene/Grooming   ? Examination-Participation Restrictions Meal Prep;Cleaning;Community Activity;Shop;Driving;Yard Work;Laundry   ? Stability/Clinical Decision Making Evolving/Moderate complexity   ? Rehab Potential Good   ? PT Frequency 3x / week   2-3x / week  ? PT Duration 6 weeks   ? PT Treatment/Interventions ADLs/Self Care Home Management;Electrical Stimulation;Gait training;Stair training;Functional mobility training;Therapeutic activities;Therapeutic exercise;Balance training;Neuromuscular re-education;Patient/family education;Energy conservation;Taping   ? PT Next Visit Plan monitor BP: nustep (upper and lower extremities), upper and lower extremity strengthening, gait training   ? Consulted and Agree with Plan of Care Patient   ? ?  ?  ? ?  ? ? ?Patient will benefit from skilled therapeutic intervention in order to improve the following deficits and impairments:  Abnormal gait, Difficulty walking,  Impaired tone, Impaired UE functional use, Decreased activity tolerance, Decreased balance, Impaired sensation, Decreased strength, Decreased mobility ? ?Visit Diagnosis: ?Muscle weakness (generalized) ? ?Difficulty

## 2021-05-09 ENCOUNTER — Telehealth: Payer: Self-pay | Admitting: Family Medicine

## 2021-05-09 ENCOUNTER — Encounter: Payer: Self-pay | Admitting: *Deleted

## 2021-05-09 ENCOUNTER — Telehealth: Payer: Self-pay

## 2021-05-09 DIAGNOSIS — Z006 Encounter for examination for normal comparison and control in clinical research program: Secondary | ICD-10-CM

## 2021-05-09 NOTE — Telephone Encounter (Signed)
Hannah Mcdaniel (Key: [id number]) [phone] Need help? Call us at [phone] ?Status ?Sent to [employer] ?Next Steps ?The plan will fax you a determination, typically within 1 to 5 business days. ? ?How do I follow up? ?Drug ?Praluent '75MG'$ /ML auto-injectors ?Form ?Adult nurse Medication Precertification Request Form ?Precertification Request for Specialty Drugs for Commercial Members ?[phone] ?[phone] ?Original Claim Info ?75 PA REQUIRED, PLEASE CALL [phone] REQUIRES PRIOR AUTHORIZATION(PHARMACY HELP DESK [phone]) ?

## 2021-05-09 NOTE — Telephone Encounter (Signed)
I need a PA completed on Praluent ASAP.  This was prescribed a month ago to replace Repatha.  I spoke to [facility] today who confirmed a PA is required. ?

## 2021-05-09 NOTE — Telephone Encounter (Signed)
PA was re submitted by Alyssa.  ?

## 2021-05-09 NOTE — Research (Signed)
My chart message sent to inform Hannah Mcdaniel about essence research. ?

## 2021-05-10 ENCOUNTER — Ambulatory Visit: Payer: 59 | Admitting: Cardiology

## 2021-05-10 ENCOUNTER — Ambulatory Visit (INDEPENDENT_AMBULATORY_CARE_PROVIDER_SITE_OTHER): Payer: 59 | Admitting: Internal Medicine

## 2021-05-10 ENCOUNTER — Encounter: Payer: Self-pay | Admitting: Internal Medicine

## 2021-05-10 DIAGNOSIS — I639 Cerebral infarction, unspecified: Secondary | ICD-10-CM

## 2021-05-10 NOTE — Patient Instructions (Addendum)
Medication Instructions:  ?Your physician recommends that you continue on your current medications as directed. Please refer to the Current Medication list given to you today. ? ?Labwork: ?None ordered. ? ?Testing/Procedures: ?None ordered. ? ?Follow-Up: ?Your physician wants you to follow-up in:  ?Jun 10, 2021 at 9:30 am for loop recorder insertion ? ?Any Other Special Instructions Will Be Listed Below (If Applicable). ? ?If you need a refill on your cardiac medications before your next appointment, please call your pharmacy.  ? ?Implantable Loop Recorder Placement ?An implantable loop recorder is a small electronic device that is placed under the skin of your chest. The device records the electrical activity of your heart over a long period of time. Your health care provider can download these recordings to monitor your heart. ?You may need an implantable loop recorder if you have periods of abnormal heart activity (arrhythmias) or unexplained fainting (syncope). The recorder can be left in place for 3 years or longer. ?

## 2021-05-10 NOTE — Progress Notes (Signed)
? ? ? ? ?HPI ?Hannah Mcdaniel is referred by Dr. Radford Pax to consider ILR insertion. She is a pleasant 56 yo woman with a h/o tobacco abuse, in remission who has had 3 strokes. She also has HTN. She has had a nice recovery. She also has know PAD s/p stenting. She denies syncope.  ?Allergies  ?Allergen Reactions  ? Asa [Aspirin] Rash  ?  Can take low-dose aspirin.  ? Bempedoic Acid Other (See Comments)  ?  Bilateral shoulder pain.  ? Codeine Rash  ? Penicillins Rash  ?  Has patient had a PCN reaction causing immediate rash, facial/tongue/throat swelling, SOB or lightheadedness with hypotension:Yes ?Has patient had a PCN reaction causing severe rash involving mucus membranes or skin necrosis:Yes ?Has patient had a PCN reaction that required hospitalization:No ?Has patient had a PCN reaction occurring within the last 10 years:No ?If all of the above answers are "NO", then may proceed with Cephalosporin use. ?  ? ? ? ?Current Outpatient Medications  ?Medication Sig Dispense Refill  ? Alirocumab (PRALUENT) 75 MG/ML SOAJ Inject 75 mg into the skin every 14 (fourteen) days. (Patient not taking: Reported on 04/23/2021) 2 mL 1  ? aspirin EC 81 MG tablet Take 81 mg by mouth daily.    ? clopidogrel (PLAVIX) 75 MG tablet Take 1 tablet (75 mg total) by mouth daily. HOLD WHILE ON BRILINTA (Patient not taking: Reported on 04/19/2021) 30 tablet 11  ? Continuous Blood Gluc Receiver (FREESTYLE LIBRE 2 READER) DEVI 1 Device by Does not apply route continuous. 1 each 0  ? Continuous Blood Gluc Sensor (FREESTYLE LIBRE 2 SENSOR) MISC 1 Device by Does not apply route every 14 (fourteen) days. 2 each 5  ? ezetimibe (ZETIA) 10 MG tablet Take 1 tablet (10 mg total) by mouth daily. 90 tablet 1  ? FLUoxetine (PROZAC) 40 MG capsule Take 1 capsule (40 mg total) by mouth daily. 90 capsule 1  ? levocetirizine (XYZAL) 5 MG tablet TAKE 1 TABLET BY MOUTH EVERY DAY IN THE EVENING (Patient taking differently: Take 5 mg by mouth every evening.) 90 tablet 1  ?  losartan (COZAAR) 50 MG tablet Take 1 tablet (50 mg total) by mouth daily. 30 tablet 2  ? metFORMIN (GLUCOPHAGE) 500 MG tablet TAKE 2 TABLETS DAILY WITH BREAKFAST AND 1 TABLET DAILY WITH SUPPER. (Patient taking differently: Take 500-1,000 mg by mouth See admin instructions. Take 1000 mg  by mouth in the morning, then take 500 mg by mouth at dinner per patient) 270 tablet 1  ? mirtazapine (REMERON) 7.5 MG tablet Take 1 tablet (7.5 mg total) by mouth at bedtime. 30 tablet 2  ? montelukast (SINGULAIR) 10 MG tablet Take 1 tablet (10 mg total) by mouth at bedtime. 90 tablet 1  ? rOPINIRole (REQUIP) 0.25 MG tablet TAKE 1-2 TABLETS (0.25-0.5 MG TOTAL) BY MOUTH AT BEDTIME. 60 tablet 1  ? rosuvastatin (CRESTOR) 40 MG tablet Take 1 tablet (40 mg total) by mouth daily. (Patient taking differently: Take 40 mg by mouth every evening.) 90 tablet 1  ? senna-docusate (SENOKOT-S) 8.6-50 MG tablet Take 1 tablet by mouth at bedtime as needed for mild constipation.    ? ?No current facility-administered medications for this visit.  ? ? ? ?Past Medical History:  ?Diagnosis Date  ? Anxiety   ? Arterial occlusive disease Nov. 2014  ? Arthritis   ? Claudication of lower extremity [facility]) Nov. 2014  ? Right Lower Extremity rest pain  ? Colon polyps   ? Depression   ?  Diabetes mellitus without complication ([place])   ? GERD (gastroesophageal reflux disease)   ? Hyperlipidemia   ? Migraines   ? Vertigo   ? ? ?ROS: ? ? All systems reviewed and negative except as noted in the HPI. ? ? ?Past Surgical History:  ?Procedure Laterality Date  ? ABDOMINAL AORTAGRAM N/A 01/10/2013  ? Procedure: ABDOMINAL AORTAGRAM;  Surgeon: Rosetta Posner, MD;  Location: [facility] CATH LAB;  Service: Cardiovascular;  Laterality: N/A;  ? ABDOMINAL AORTOGRAM W/LOWER EXTREMITY Bilateral 07/16/2018  ? Procedure: ABDOMINAL AORTOGRAM W/LOWER EXTREMITY;  Surgeon: Angelia Mould, MD;  Location: [place] CV LAB;  Service: Cardiovascular;  Laterality: Bilateral;  ? ABDOMINAL  AORTOGRAM W/LOWER EXTREMITY N/A 10/05/2020  ? Procedure: ABDOMINAL AORTOGRAM W/LOWER EXTREMITY;  Surgeon: Angelia Mould, MD;  Location: [place] CV LAB;  Service: Cardiovascular;  Laterality: N/A;  ? BUBBLE STUDY  03/26/2021  ? Procedure: BUBBLE STUDY;  Surgeon: Donato Heinz, MD;  Location: [place];  Service: Cardiovascular;;  ? CHOLECYSTECTOMY    ? Gall Bladder  ? iliac artery angioplasty and stent placement  01/10/13  ? LOWER EXTREMITY ANGIOGRAM Bilateral 01/10/2013  ? Procedure: LOWER EXTREMITY ANGIOGRAM;  Surgeon: Rosetta Posner, MD;  Location: [facility] CATH LAB;  Service: Cardiovascular;  Laterality: Bilateral;  ? PERCUTANEOUS STENT INTERVENTION Right 01/10/2013  ? Procedure: PERCUTANEOUS STENT INTERVENTION;  Surgeon: Rosetta Posner, MD;  Location: [facility] CATH LAB;  Service: Cardiovascular;  Laterality: Right;  rt common iliac stent  ? PERIPHERAL VASCULAR CATHETERIZATION N/A 11/26/2015  ? Procedure: Abdominal Aortogram w/Lower Extremity;  Surgeon: Angelia Mould, MD;  Location: [place] CV LAB;  Service: Cardiovascular;  Laterality: N/A;  ? PERIPHERAL VASCULAR CATHETERIZATION Right 11/26/2015  ? Procedure: Peripheral Vascular Balloon Angioplasty;  Surgeon: Angelia Mould, MD;  Location: [place] CV LAB;  Service: Cardiovascular;  Laterality: Right;  rt common iliac  ? PERIPHERAL VASCULAR INTERVENTION  07/16/2018  ? Procedure: PERIPHERAL VASCULAR INTERVENTION;  Surgeon: Angelia Mould, MD;  Location: [place] CV LAB;  Service: Cardiovascular;;  ? PERIPHERAL VASCULAR INTERVENTION Right 10/05/2020  ? Procedure: PERIPHERAL VASCULAR INTERVENTION;  Surgeon: Angelia Mould, MD;  Location: [place] CV LAB;  Service: Cardiovascular;  Laterality: Right;  Common and exteral iliac artery  ? TEE WITHOUT CARDIOVERSION N/A 03/26/2021  ? Procedure: TRANSESOPHAGEAL ECHOCARDIOGRAM (TEE);  Surgeon: Donato Heinz, MD;  Location: [facility] ENDOSCOPY;  Service: Cardiovascular;   Laterality: N/A;  ? TOTAL ABDOMINAL HYSTERECTOMY    ? ? ? ?Family History  ?Problem Relation Age of Onset  ? Diabetes Mother   ? Hyperlipidemia Mother   ? Hypertension Mother   ? Varicose Veins Mother   ? COPD Mother   ? Stroke Mother   ? Anxiety disorder Mother   ? Depression Mother   ? Ovarian cancer Mother   ? Acute myelogenous leukemia Father   ? Diabetes Brother   ? Hypertension Brother   ? Hyperlipidemia Brother   ? Stroke Maternal Grandmother   ? Anxiety disorder Maternal Grandmother   ? Depression Maternal Grandmother   ? Breast cancer Maternal Grandmother   ? Diabetes Maternal Grandmother   ? Heart disease Maternal Grandfather   ? Lung cancer Paternal Grandfather   ? Other Brother   ?     Blood disease  ? Anxiety disorder Daughter   ? Migraines Daughter   ? Migraines Daughter   ? Migraines Son   ? Seizures Son   ? ? ? ?Social History  ? ?Socioeconomic  History  ? Marital status: Married  ?  Spouse name: Not on file  ? Number of children: 3  ? Years of education: Not on file  ? Highest education level: Not on file  ?Occupational History  ? Not on file  ?Tobacco Use  ? Smoking status: Former  ?  Packs/day: 0.25  ?  Types: Cigarettes  ?  Start date: 05/16/1977  ?  Quit date: 03/21/2021  ?  Years since quitting: 0.1  ? Smokeless tobacco: Never  ? Tobacco comments:  ?  1/2 cigarette per day  ?Vaping Use  ? Vaping Use: Never used  ?Substance and Sexual Activity  ? Alcohol use: Yes  ?  Alcohol/week: 1.0 standard drink  ?  Types: 1 Shots of liquor per week  ?  Comment: occ  ? Drug use: No  ? Sexual activity: Yes  ?  Partners: Male  ?  Birth control/protection: Surgical  ?Other Topics Concern  ? Not on file  ?Social History Narrative  ? Not on file  ? ?Social Determinants of Health  ? ?Financial Resource Strain: Not on file  ?Food Insecurity: Not on file  ?Transportation Needs: Not on file  ?Physical Activity: Not on file  ?Stress: Not on file  ?Social Connections: Not on file  ?Intimate Partner Violence: Not on file   ? ? ? ?BP (!) 148/78   Pulse 79   Ht '5\' 2"'$  (1.575 m)   Wt 140 lb (63.5 kg)   SpO2 98%   BMI 25.61 kg/m?  ? ?Physical Exam: ? ?Well appearing middle aged woman, NAD ?HEENT: Unremarkable ?Neck:  No JVD, no thyromegall

## 2021-05-11 ENCOUNTER — Other Ambulatory Visit: Payer: Self-pay | Admitting: Family Medicine

## 2021-05-11 DIAGNOSIS — G479 Sleep disorder, unspecified: Secondary | ICD-10-CM

## 2021-05-11 DIAGNOSIS — R630 Anorexia: Secondary | ICD-10-CM

## 2021-05-14 DIAGNOSIS — M21372 Foot drop, left foot: Secondary | ICD-10-CM | POA: Diagnosis not present

## 2021-05-14 NOTE — Telephone Encounter (Signed)
Please see PA note on 04/06 ?

## 2021-05-15 ENCOUNTER — Ambulatory Visit: Payer: 59

## 2021-05-15 DIAGNOSIS — M6281 Muscle weakness (generalized): Secondary | ICD-10-CM | POA: Diagnosis not present

## 2021-05-15 DIAGNOSIS — R262 Difficulty in walking, not elsewhere classified: Secondary | ICD-10-CM | POA: Diagnosis not present

## 2021-05-15 NOTE — Therapy (Signed)
[place] [facility] ?[place], [place], [address] ?Phone: [phone]   Fax:  [phone] ? ?Physical Therapy Treatment ? ?Patient Details  ?Name: Hannah Mcdaniel ?MRN: [id number] ?Date of Birth: 06-10-1965 ?Referring Provider (PT): Thereasa Solo, MD ? ? ?Encounter Date: 05/15/2021 ? ? PT End of Session - 05/15/21 1019   ? ? Visit Number 14   ? Number of Visits 16   ? Date for PT Re-Evaluation 06/07/21   ? PT Start Time 1030   ? PT Stop Time 1114   ? PT Time Calculation (min) 44 min   ? Activity Tolerance Patient tolerated treatment well   ? Behavior During Therapy [facility] for tasks assessed/performed   ? ?  ?  ? ?  ? ? ?Past Medical History:  ?Diagnosis Date  ? Anxiety   ? Arterial occlusive disease Nov. 2014  ? Arthritis   ? Claudication of lower extremity [facility]) Nov. 2014  ? Right Lower Extremity rest pain  ? Colon polyps   ? Depression   ? Diabetes mellitus without complication ([place])   ? GERD (gastroesophageal reflux disease)   ? Hyperlipidemia   ? Migraines   ? Vertigo   ? ? ?Past Surgical History:  ?Procedure Laterality Date  ? ABDOMINAL AORTAGRAM N/A 01/10/2013  ? Procedure: ABDOMINAL AORTAGRAM;  Surgeon: Rosetta Posner, MD;  Location: [facility] CATH LAB;  Service: Cardiovascular;  Laterality: N/A;  ? ABDOMINAL AORTOGRAM W/LOWER EXTREMITY Bilateral 07/16/2018  ? Procedure: ABDOMINAL AORTOGRAM W/LOWER EXTREMITY;  Surgeon: Angelia Mould, MD;  Location: [place] CV LAB;  Service: Cardiovascular;  Laterality: Bilateral;  ? ABDOMINAL AORTOGRAM W/LOWER EXTREMITY N/A 10/05/2020  ? Procedure: ABDOMINAL AORTOGRAM W/LOWER EXTREMITY;  Surgeon: Angelia Mould, MD;  Location: [place] CV LAB;  Service: Cardiovascular;  Laterality: N/A;  ? BUBBLE STUDY  03/26/2021  ? Procedure: BUBBLE STUDY;  Surgeon: Donato Heinz, MD;  Location: [place];  Service: Cardiovascular;;  ? CHOLECYSTECTOMY    ? Gall Bladder  ? iliac artery angioplasty and stent placement  01/10/13  ?  LOWER EXTREMITY ANGIOGRAM Bilateral 01/10/2013  ? Procedure: LOWER EXTREMITY ANGIOGRAM;  Surgeon: Rosetta Posner, MD;  Location: [facility] CATH LAB;  Service: Cardiovascular;  Laterality: Bilateral;  ? PERCUTANEOUS STENT INTERVENTION Right 01/10/2013  ? Procedure: PERCUTANEOUS STENT INTERVENTION;  Surgeon: Rosetta Posner, MD;  Location: [facility] CATH LAB;  Service: Cardiovascular;  Laterality: Right;  rt common iliac stent  ? PERIPHERAL VASCULAR CATHETERIZATION N/A 11/26/2015  ? Procedure: Abdominal Aortogram w/Lower Extremity;  Surgeon: Angelia Mould, MD;  Location: [place] CV LAB;  Service: Cardiovascular;  Laterality: N/A;  ? PERIPHERAL VASCULAR CATHETERIZATION Right 11/26/2015  ? Procedure: Peripheral Vascular Balloon Angioplasty;  Surgeon: Angelia Mould, MD;  Location: [place] CV LAB;  Service: Cardiovascular;  Laterality: Right;  rt common iliac  ? PERIPHERAL VASCULAR INTERVENTION  07/16/2018  ? Procedure: PERIPHERAL VASCULAR INTERVENTION;  Surgeon: Angelia Mould, MD;  Location: [place] CV LAB;  Service: Cardiovascular;;  ? PERIPHERAL VASCULAR INTERVENTION Right 10/05/2020  ? Procedure: PERIPHERAL VASCULAR INTERVENTION;  Surgeon: Angelia Mould, MD;  Location: [place] CV LAB;  Service: Cardiovascular;  Laterality: Right;  Common and exteral iliac artery  ? TEE WITHOUT CARDIOVERSION N/A 03/26/2021  ? Procedure: TRANSESOPHAGEAL ECHOCARDIOGRAM (TEE);  Surgeon: Donato Heinz, MD;  Location: [facility] ENDOSCOPY;  Service: Cardiovascular;  Laterality: N/A;  ? TOTAL ABDOMINAL HYSTERECTOMY    ? ? ?There were no vitals filed for this visit. ? ?  Subjective Assessment - 05/15/21 1018   ? ? Pertinent History history of CVA   ? Limitations Lifting   ? How long can you stand comfortably? unable to stand without support   ? Patient Stated Goals walk independently and drive   ? ?  ?  ? ?  ? ? ? ? ? ? ? ? ? ? ? ? ? ? ? ? ? ? ? ? [place] Adult PT Treatment/Exercise - 05/15/21 0001   ? ?  ? Knee/Hip  Exercises: Aerobic  ? Nustep Lvl 9-10 x 15 mins   ?  ? Knee/Hip Exercises: Machines for Strengthening  ? Cybex Knee Extension 20# x 20 reps   ? Cybex Knee Flexion 40# x 20 reps   ? Cybex Leg Press 2 plates x 20 reps   ?  ? Knee/Hip Exercises: Standing  ? Gait Training 82 ft x 4 without AD, left foot over obstacles   contact with 2 taller obstacles  ?  ? Shoulder Exercises: Standing  ? Other Standing Exercises Push-ups to elevated treatment table x 20 reps   ?  ? Shoulder Exercises: ROM/Strengthening  ? UBE (Upper Arm Bike) 10 mins (forward/backward)   ? ?  ?  ? ?  ? ? ? ? ? ? Balance Exercises - 05/15/21 0001   ? ?  ? Balance Exercises: Standing  ? Other Standing Exercises BOSU weight shift   lateral x 2 mins  ? ?  ?  ? ?  ? ? ? ? ? ? ? ? ? ? PT Long Term Goals - 04/26/21 0919   ? ?  ? PT LONG TERM GOAL #1  ? Title Patient will be independent with her HEP.   ? Time 6   ? Period Weeks   ? Status Achieved   ? Target Date 05/13/21   ?  ? PT LONG TERM GOAL #2  ? Title Patient will be able to safely ambulate at least 80 feet with a cane or least restrictive device for improved household mobility.   ? Baseline 04/10/21: 66 ft x 2 with SPC, left foot slight drag; 3/15: rolling walker with gait deviations; 04/26/21: 82 ft x 2 with SPC mild left foot drag last 15 ft   ? Time 6   ? Period Weeks   ? Status Partially Met   ? Target Date 05/13/21   ?  ? PT LONG TERM GOAL #3  ? Title Patient will improve her grip strength of her left hand to at least 30 pounds for improved functional use of her LUE.   ? Baseline 04/10/21: 35 pounds 3/15: 25 pounds; 04/26/21: 29 pounds   ? Time 6   ? Period Weeks   ? Status On-going   ? Target Date 05/13/21   ?  ? PT LONG TERM GOAL #4  ? Title Patient will improve her 5x sit to stand time to 12 seconds or less for improved safety and reduced fall risk.   ? Baseline 04/10/21: 18.97 seconds 3/15: 16 seconds; 04/26/21: 16 seconds   ? Time 6   ? Period Weeks   ? Status Revised   ? Target Date 05/13/21   ? ?  ?   ? ?  ? ? ? ? ? ? ? ? Plan - 05/15/21 1019   ? ? Clinical Impression Statement Pt arrives for today's treatment session denying any pain.  Pt with new AFO donned and states that it is going well, but that it  will take some getting used to.  Pt able to demonstrate increased stability during ambulation while stepping over obstacles with her left foot.  Pt over exaggerating lift with left leg, making contact with two of the other obstacles.  Pt instructed in exercises on cybex for knee flexion, extension, and leg press.  Pt requiring cues for eccentric control.  Pt challenged by push-ups to elevated treatment table.  Pt denied any pain at completion of today's treatment session.   ? Personal Factors and Comorbidities Transportation;Comorbidity 3+   ? Comorbidities Multiple CVA's, PAD, DM, smoker,   ? Examination-Activity Limitations Locomotion Level;Transfers;Bathing;Caring for Others;Carry;Hygiene/Grooming   ? Examination-Participation Restrictions Meal Prep;Cleaning;Community Activity;Shop;Driving;Yard Work;Laundry   ? Stability/Clinical Decision Making Evolving/Moderate complexity   ? Rehab Potential Good   ? PT Frequency 3x / week   2-3x / week  ? PT Duration 6 weeks   ? PT Treatment/Interventions ADLs/Self Care Home Management;Electrical Stimulation;Gait training;Stair training;Functional mobility training;Therapeutic activities;Therapeutic exercise;Balance training;Neuromuscular re-education;Patient/family education;Energy conservation;Taping   ? PT Next Visit Plan monitor BP: nustep (upper and lower extremities), upper and lower extremity strengthening, gait training   ? Consulted and Agree with Plan of Care Patient   ? ?  ?  ? ?  ? ? ?Patient will benefit from skilled therapeutic intervention in order to improve the following deficits and impairments:  Abnormal gait, Difficulty walking, Impaired tone, Impaired UE functional use, Decreased activity tolerance, Decreased balance, Impaired sensation, Decreased  strength, Decreased mobility ? ?Visit Diagnosis: ?Muscle weakness (generalized) ? ?Difficulty in walking, not elsewhere classified ? ? ? ? ?Problem List ?Patient Active Problem List  ? Diagnosis Date Noted

## 2021-05-15 NOTE — Therapy (Signed)
[place] ?[facility] ?[address] ?[place], [place], [address] ?Phone: [phone]   Fax:  [phone] ? ?Physical Therapy Treatment ? ?Patient Details  ?Name: Hannah Mcdaniel ?MRN: [id number] ?Date of Birth: 1965/10/22 ?Referring Provider (PT): Thereasa Solo, MD ? ? ?Encounter Date: 05/15/2021 ? ? PT End of Session - 05/15/21 1019   ? ? Visit Number 14   ? Number of Visits 16   ? Date for PT Re-Evaluation 06/07/21   ? PT Start Time 1030   ? PT Stop Time 1114   ? PT Time Calculation (min) 44 min   ? Activity Tolerance Patient tolerated treatment well   ? Behavior During Therapy [facility] for tasks assessed/performed   ? ?  ?  ? ?  ? ? ?Past Medical History:  ?Diagnosis Date  ? Anxiety   ? Arterial occlusive disease Nov. 2014  ? Arthritis   ? Claudication of lower extremity [facility]) Nov. 2014  ? Right Lower Extremity rest pain  ? Colon polyps   ? Depression   ? Diabetes mellitus without complication ([place])   ? GERD (gastroesophageal reflux disease)   ? Hyperlipidemia   ? Migraines   ? Vertigo   ? ? ?Past Surgical History:  ?Procedure Laterality Date  ? ABDOMINAL AORTAGRAM N/A 01/10/2013  ? Procedure: ABDOMINAL AORTAGRAM;  Surgeon: Rosetta Posner, MD;  Location: [facility] CATH LAB;  Service: Cardiovascular;  Laterality: N/A;  ? ABDOMINAL AORTOGRAM W/LOWER EXTREMITY Bilateral 07/16/2018  ? Procedure: ABDOMINAL AORTOGRAM W/LOWER EXTREMITY;  Surgeon: Angelia Mould, MD;  Location: [place] CV LAB;  Service: Cardiovascular;  Laterality: Bilateral;  ? ABDOMINAL AORTOGRAM W/LOWER EXTREMITY N/A 10/05/2020  ? Procedure: ABDOMINAL AORTOGRAM W/LOWER EXTREMITY;  Surgeon: Angelia Mould, MD;  Location: [place] CV LAB;  Service: Cardiovascular;  Laterality: N/A;  ? BUBBLE STUDY  03/26/2021  ? Procedure: BUBBLE STUDY;  Surgeon: Donato Heinz, MD;  Location: [place];  Service: Cardiovascular;;  ? CHOLECYSTECTOMY    ? Gall Bladder  ? iliac artery angioplasty and stent placement  01/10/13  ?  LOWER EXTREMITY ANGIOGRAM Bilateral 01/10/2013  ? Procedure: LOWER EXTREMITY ANGIOGRAM;  Surgeon: Rosetta Posner, MD;  Location: [facility] CATH LAB;  Service: Cardiovascular;  Laterality: Bilateral;  ? PERCUTANEOUS STENT INTERVENTION Right 01/10/2013  ? Procedure: PERCUTANEOUS STENT INTERVENTION;  Surgeon: Rosetta Posner, MD;  Location: [facility] CATH LAB;  Service: Cardiovascular;  Laterality: Right;  rt common iliac stent  ? PERIPHERAL VASCULAR CATHETERIZATION N/A 11/26/2015  ? Procedure: Abdominal Aortogram w/Lower Extremity;  Surgeon: Angelia Mould, MD;  Location: [place] CV LAB;  Service: Cardiovascular;  Laterality: N/A;  ? PERIPHERAL VASCULAR CATHETERIZATION Right 11/26/2015  ? Procedure: Peripheral Vascular Balloon Angioplasty;  Surgeon: Angelia Mould, MD;  Location: [place] CV LAB;  Service: Cardiovascular;  Laterality: Right;  rt common iliac  ? PERIPHERAL VASCULAR INTERVENTION  07/16/2018  ? Procedure: PERIPHERAL VASCULAR INTERVENTION;  Surgeon: Angelia Mould, MD;  Location: [place] CV LAB;  Service: Cardiovascular;;  ? PERIPHERAL VASCULAR INTERVENTION Right 10/05/2020  ? Procedure: PERIPHERAL VASCULAR INTERVENTION;  Surgeon: Angelia Mould, MD;  Location: [place] CV LAB;  Service: Cardiovascular;  Laterality: Right;  Common and exteral iliac artery  ? TEE WITHOUT CARDIOVERSION N/A 03/26/2021  ? Procedure: TRANSESOPHAGEAL ECHOCARDIOGRAM (TEE);  Surgeon: Donato Heinz, MD;  Location: [facility] ENDOSCOPY;  Service: Cardiovascular;  Laterality: N/A;  ? TOTAL ABDOMINAL HYSTERECTOMY    ? ? ?There were no vitals filed for this visit. ? ?  Subjective Assessment - 05/15/21 1018   ? ? Pertinent History history of CVA   ? Limitations Lifting   ? How long can you stand comfortably? unable to stand without support   ? Patient Stated Goals walk independently and drive   ? ?  ?  ? ?  ? ? ? ? ? ? ? ? ? ? ? ? ? ? ? ? ? ? ? ? [place] Adult PT Treatment/Exercise - 05/15/21 0001   ? ?  ? Knee/Hip  Exercises: Aerobic  ? Nustep Lvl 9-10 x 15 mins   ?  ? Knee/Hip Exercises: Machines for Strengthening  ? Cybex Knee Extension 20# x 20 reps   ? Cybex Knee Flexion 40# x 20 reps   ? Cybex Leg Press 2 plates x 20 reps   ?  ? Knee/Hip Exercises: Standing  ? Gait Training 82 ft x 4 without AD, left foot over obstacles   contact with 2 taller obstacles  ?  ? Shoulder Exercises: Standing  ? Other Standing Exercises Push-ups to elevated treatment table x 20 reps   ?  ? Shoulder Exercises: ROM/Strengthening  ? UBE (Upper Arm Bike) 10 mins (forward/backward)   ? ?  ?  ? ?  ? ? ? ? ? ? Balance Exercises - 05/15/21 0001   ? ?  ? Balance Exercises: Standing  ? Other Standing Exercises BOSU weight shift   lateral x 2 mins  ? ?  ?  ? ?  ? ? ? ? ? ? ? ? ? ? PT Long Term Goals - 04/26/21 0919   ? ?  ? PT LONG TERM GOAL #1  ? Title Patient will be independent with her HEP.   ? Time 6   ? Period Weeks   ? Status Achieved   ? Target Date 05/13/21   ?  ? PT LONG TERM GOAL #2  ? Title Patient will be able to safely ambulate at least 80 feet with a cane or least restrictive device for improved household mobility.   ? Baseline 04/10/21: 66 ft x 2 with SPC, left foot slight drag; 3/15: rolling walker with gait deviations; 04/26/21: 82 ft x 2 with SPC mild left foot drag last 15 ft   ? Time 6   ? Period Weeks   ? Status Partially Met   ? Target Date 05/13/21   ?  ? PT LONG TERM GOAL #3  ? Title Patient will improve her grip strength of her left hand to at least 30 pounds for improved functional use of her LUE.   ? Baseline 04/10/21: 35 pounds 3/15: 25 pounds; 04/26/21: 29 pounds   ? Time 6   ? Period Weeks   ? Status On-going   ? Target Date 05/13/21   ?  ? PT LONG TERM GOAL #4  ? Title Patient will improve her 5x sit to stand time to 12 seconds or less for improved safety and reduced fall risk.   ? Baseline 04/10/21: 18.97 seconds 3/15: 16 seconds; 04/26/21: 16 seconds   ? Time 6   ? Period Weeks   ? Status Revised   ? Target Date 05/13/21   ? ?  ?   ? ?  ? ? ? ? ? ? ? ? Plan - 05/15/21 1019   ? ? Clinical Impression Statement Pt arrives for today's treatment session denying any pain.  Pt with new AFO donned and states that it is going well, but that it  will take some getting used to.  Pt able to demonstrate increased stability during ambulation while stepping over obstacles with her left foot.  Pt over exaggerating lift with left leg, making contact with two of the other obstacles.  Pt instructed in exercises on cybex for knee flexion, extension, and leg press.  Pt requiring cues for eccentric control.  Pt challenged by push-ups to elevated treatment table.  Pt denied any pain at completion of today's treatment session.   ? Personal Factors and Comorbidities Transportation;Comorbidity 3+   ? Comorbidities Multiple CVA's, PAD, DM, smoker,   ? Examination-Activity Limitations Locomotion Level;Transfers;Bathing;Caring for Others;Carry;Hygiene/Grooming   ? Examination-Participation Restrictions Meal Prep;Cleaning;Community Activity;Shop;Driving;Yard Work;Laundry   ? Stability/Clinical Decision Making Evolving/Moderate complexity   ? Rehab Potential Good   ? PT Frequency 3x / week   2-3x / week  ? PT Duration 6 weeks   ? PT Treatment/Interventions ADLs/Self Care Home Management;Electrical Stimulation;Gait training;Stair training;Functional mobility training;Therapeutic activities;Therapeutic exercise;Balance training;Neuromuscular re-education;Patient/family education;Energy conservation;Taping   ? PT Next Visit Plan monitor BP: nustep (upper and lower extremities), upper and lower extremity strengthening, gait training   ? Consulted and Agree with Plan of Care Patient   ? ?  ?  ? ?  ? ? ?Patient will benefit from skilled therapeutic intervention in order to improve the following deficits and impairments:  Abnormal gait, Difficulty walking, Impaired tone, Impaired UE functional use, Decreased activity tolerance, Decreased balance, Impaired sensation, Decreased  strength, Decreased mobility ? ?Visit Diagnosis: ?Muscle weakness (generalized) ? ?Difficulty in walking, not elsewhere classified ? ? ? ? ?Problem List ?Patient Active Problem List  ? Diagnosis Date Noted

## 2021-05-17 ENCOUNTER — Ambulatory Visit: Payer: 59

## 2021-05-17 DIAGNOSIS — M6281 Muscle weakness (generalized): Secondary | ICD-10-CM

## 2021-05-17 DIAGNOSIS — R262 Difficulty in walking, not elsewhere classified: Secondary | ICD-10-CM

## 2021-05-17 NOTE — Therapy (Addendum)
[facility] ?[address] ?[place], [place], [address] ?Phone: [phone]   Fax:  [phone] ? ?Physical Therapy Treatment ? ?Patient Details  ?Name: Hannah Mcdaniel ?MRN: [id number] ?Date of Birth: 10-22-1965 ?Referring Provider (PT): Thereasa Solo, MD ? ? ?Encounter Date: 05/17/2021 ? ? PT End of Session - 05/17/21 1034   ? ? Visit Number 15   ? Number of Visits 16   ? Date for PT Re-Evaluation 06/07/21   ? PT Start Time 1030   ? PT Stop Time 1117   ? PT Time Calculation (min) 47 min   ? Activity Tolerance Patient tolerated treatment well   ? Behavior During Therapy [facility] for tasks assessed/performed   ? ?  ?  ? ?  ? ? ?Past Medical History:  ?Diagnosis Date  ? Anxiety   ? Arterial occlusive disease Nov. 2014  ? Arthritis   ? Claudication of lower extremity [facility]) Nov. 2014  ? Right Lower Extremity rest pain  ? Colon polyps   ? Depression   ? Diabetes mellitus without complication ([place])   ? GERD (gastroesophageal reflux disease)   ? Hyperlipidemia   ? Migraines   ? Vertigo   ? ? ?Past Surgical History:  ?Procedure Laterality Date  ? ABDOMINAL AORTAGRAM N/A 01/10/2013  ? Procedure: ABDOMINAL AORTAGRAM;  Surgeon: Rosetta Posner, MD;  Location: [facility] CATH LAB;  Service: Cardiovascular;  Laterality: N/A;  ? ABDOMINAL AORTOGRAM W/LOWER EXTREMITY Bilateral 07/16/2018  ? Procedure: ABDOMINAL AORTOGRAM W/LOWER EXTREMITY;  Surgeon: Angelia Mould, MD;  Location: [place] CV LAB;  Service: Cardiovascular;  Laterality: Bilateral;  ? ABDOMINAL AORTOGRAM W/LOWER EXTREMITY N/A 10/05/2020  ? Procedure: ABDOMINAL AORTOGRAM W/LOWER EXTREMITY;  Surgeon: Angelia Mould, MD;  Location: [place] CV LAB;  Service: Cardiovascular;  Laterality: N/A;  ? BUBBLE STUDY  03/26/2021  ? Procedure: BUBBLE STUDY;  Surgeon: Donato Heinz, MD;  Location: [place];  Service: Cardiovascular;;  ? CHOLECYSTECTOMY    ? Gall Bladder  ? iliac artery angioplasty and stent placement  01/10/13  ?  LOWER EXTREMITY ANGIOGRAM Bilateral 01/10/2013  ? Procedure: LOWER EXTREMITY ANGIOGRAM;  Surgeon: Rosetta Posner, MD;  Location: [facility] CATH LAB;  Service: Cardiovascular;  Laterality: Bilateral;  ? PERCUTANEOUS STENT INTERVENTION Right 01/10/2013  ? Procedure: PERCUTANEOUS STENT INTERVENTION;  Surgeon: Rosetta Posner, MD;  Location: [facility] CATH LAB;  Service: Cardiovascular;  Laterality: Right;  rt common iliac stent  ? PERIPHERAL VASCULAR CATHETERIZATION N/A 11/26/2015  ? Procedure: Abdominal Aortogram w/Lower Extremity;  Surgeon: Angelia Mould, MD;  Location: [place] CV LAB;  Service: Cardiovascular;  Laterality: N/A;  ? PERIPHERAL VASCULAR CATHETERIZATION Right 11/26/2015  ? Procedure: Peripheral Vascular Balloon Angioplasty;  Surgeon: Angelia Mould, MD;  Location: [place] CV LAB;  Service: Cardiovascular;  Laterality: Right;  rt common iliac  ? PERIPHERAL VASCULAR INTERVENTION  07/16/2018  ? Procedure: PERIPHERAL VASCULAR INTERVENTION;  Surgeon: Angelia Mould, MD;  Location: [place] CV LAB;  Service: Cardiovascular;;  ? PERIPHERAL VASCULAR INTERVENTION Right 10/05/2020  ? Procedure: PERIPHERAL VASCULAR INTERVENTION;  Surgeon: Angelia Mould, MD;  Location: [place] CV LAB;  Service: Cardiovascular;  Laterality: Right;  Common and exteral iliac artery  ? TEE WITHOUT CARDIOVERSION N/A 03/26/2021  ? Procedure: TRANSESOPHAGEAL ECHOCARDIOGRAM (TEE);  Surgeon: Donato Heinz, MD;  Location: [facility] ENDOSCOPY;  Service: Cardiovascular;  Laterality: N/A;  ? TOTAL ABDOMINAL HYSTERECTOMY    ? ? ?There were no vitals filed for this visit. ? ?  Subjective Assessment - 05/17/21 1034   ? ? Subjective Pt arrives for today's treatment session denying any pain.  Pt states that she is ready to discharge today.   ? Pertinent History history of CVA   ? Limitations Lifting   ? How long can you stand comfortably? unable to stand without support   ? Patient Stated Goals walk independently and drive    ? Currently in Pain? No/denies   ? ?  ?  ? ?  ? ? ? ? ? ? ? ? ? ? ? ? ? ? ? ? ? ? ? ? [place] Adult PT Treatment/Exercise - 05/17/21 0001   ? ?  ? Knee/Hip Exercises: Aerobic  ? Nustep Lvl 8-10 x 15 mins   ? ?  ?  ? ?  ? ? ? ? ? ? Balance Exercises - 05/17/21 0001   ? ?  ? Balance Exercises: Standing  ? Other Standing Exercises Walkouts with Blue XTS x 5 reps each direction   ? Other Standing Exercises Comments BOSU ball x 2 mins   ? ?  ?  ? ?  ? ? ? ? ? ? ? ? ? ? PT Long Term Goals - 05/17/21 1035   ? ?  ? PT LONG TERM GOAL #1  ? Title Patient will be independent with her HEP.   ? Time 6   ? Period Weeks   ? Status Achieved   ? Target Date 05/13/21   ?  ? PT LONG TERM GOAL #2  ? Title Patient will be able to safely ambulate at least 80 feet with a cane or least restrictive device for improved household mobility.   ? Baseline 04/10/21: 66 ft x 2 with SPC, left foot slight drag; 3/15: rolling walker with gait deviations; 04/26/21: 82 ft x 2 with SPC mild left foot drag last 15 ft; 05/17/21: pt able to ambulate 82 ft x 2 with no AD, sup +1   ? Time 6   ? Period Weeks   ? Status Achieved   ? Target Date 05/13/21   ?  ? PT LONG TERM GOAL #3  ? Title Patient will improve her grip strength of her left hand to at least 30 pounds for improved functional use of her LUE.   ? Baseline 04/10/21: 35 pounds 3/15: 25 pounds; 04/26/21: 29 pounds; 05/17/21: 25 pounds   ? Time 6   ? Period Weeks   ? Status On-going   ? Target Date 05/13/21   ?  ? PT LONG TERM GOAL #4  ? Title Patient will improve her 5x sit to stand time to 12 seconds or less for improved safety and reduced fall risk.   ? Baseline 04/10/21: 18.97 seconds 3/15: 16 seconds; 04/26/21: 16 seconds; 05/17/21: 12.5 seconds   ? Time 6   ? Period Weeks   ? Status Achieved   ? Target Date 05/13/21   ? ?  ?  ? ?  ? ? ? ? ? ? ? ? Plan - 05/17/21 1035   ? ? Clinical Impression Statement Pt arrives for today's treamtent session denying any pain.  Pt ambulates into the facility with AFO donned  and carrying SPC.  Pt attempted resisted walkouts with orange XTS band, but unable ot perform reps with orange and deferred bak to blue XTS.  Pt has made significant progress toward to five STS goal with 14.5 seconds today.  Pt with continued limitation in B grip strength, but denies  any issues with performance of ADLs.  Pt states that she feels ready to discharge at this time. Pt encouraged to call facility with any questions or concerns.  Pt denied any pain at completion of today's treatment session.   ? Personal Factors and Comorbidities Transportation;Comorbidity 3+   ? Comorbidities Multiple CVA's, PAD, DM, smoker,   ? Examination-Activity Limitations Locomotion Level;Transfers;Bathing;Caring for Others;Carry;Hygiene/Grooming   ? Examination-Participation Restrictions Meal Prep;Cleaning;Community Activity;Shop;Driving;Yard Work;Laundry   ? Stability/Clinical Decision Making Evolving/Moderate complexity   ? Rehab Potential Good   ? PT Frequency 3x / week   2-3x / week  ? PT Duration 6 weeks   ? PT Treatment/Interventions ADLs/Self Care Home Management;Electrical Stimulation;Gait training;Stair training;Functional mobility training;Therapeutic activities;Therapeutic exercise;Balance training;Neuromuscular re-education;Patient/family education;Energy conservation;Taping   ? PT Next Visit Plan monitor BP: nustep (upper and lower extremities), upper and lower extremity strengthening, gait training   ? Consulted and Agree with Plan of Care Patient   ? ?  ?  ? ?  ? ? ?Patient will benefit from skilled therapeutic intervention in order to improve the following deficits and impairments:  Abnormal gait, Difficulty walking, Impaired tone, Impaired UE functional use, Decreased activity tolerance, Decreased balance, Impaired sensation, Decreased strength, Decreased mobility ? ?Visit Diagnosis: ?Muscle weakness (generalized) ? ?Difficulty in walking, not elsewhere classified ? ? ? ? ?Problem List ?Patient Active Problem List   ? Diagnosis Date Noted  ? Cryptogenic stroke ([place]) 05/10/2021  ? Malnutrition of moderate degree 04/13/2021  ? Essential hypertension 04/11/2021  ? CVA (cerebral vascular accident) ([place]) 03/26/2021  ?

## 2021-05-22 NOTE — Telephone Encounter (Signed)
PA resubmitted today. Insurance stated they never received. I advised them it was completed through [place] on 05/09/21. ?Prior auth number [id number] ?I have faxed clinical notes to [phone].  ?

## 2021-05-23 ENCOUNTER — Other Ambulatory Visit: Payer: Self-pay | Admitting: Family Medicine

## 2021-05-23 DIAGNOSIS — G2581 Restless legs syndrome: Secondary | ICD-10-CM

## 2021-05-23 NOTE — Telephone Encounter (Signed)
PA approved from 05/23/21-11/22/21. ? ?Pharmacy and patient aware.  ?

## 2021-05-24 ENCOUNTER — Encounter: Payer: Self-pay | Admitting: *Deleted

## 2021-05-24 DIAGNOSIS — Z006 Encounter for examination for normal comparison and control in clinical research program: Secondary | ICD-10-CM

## 2021-05-24 NOTE — Research (Signed)
Spoke with Ms Dinkel she states that she would like more information about [employer] Consent was emailed for her to review. Encouraged her to cal me back. ? ?

## 2021-06-04 ENCOUNTER — Ambulatory Visit: Payer: 59 | Admitting: Family Medicine

## 2021-06-10 ENCOUNTER — Encounter: Payer: Self-pay | Admitting: Internal Medicine

## 2021-06-10 ENCOUNTER — Ambulatory Visit (INDEPENDENT_AMBULATORY_CARE_PROVIDER_SITE_OTHER): Payer: 59 | Admitting: Internal Medicine

## 2021-06-10 VITALS — BP 138/74 | HR 82 | Ht 61.0 in | Wt 145.0 lb

## 2021-06-10 DIAGNOSIS — I639 Cerebral infarction, unspecified: Secondary | ICD-10-CM | POA: Diagnosis not present

## 2021-06-10 NOTE — Progress Notes (Signed)
? ? ? ? ?HPI ?Hannah Mcdaniel is referred by Dr. Radford Pax to consider ILR insertion. She is a pleasant 56 yo woman with a h/o tobacco abuse, in remission who has had 3 strokes. She also has HTN. She has had a nice recovery. She also has know PAD s/p stenting. She denies syncope.  ?Allergies  ?Allergen Reactions  ? Asa [Aspirin] Rash  ?  Can take low-dose aspirin.  ? Bempedoic Acid Other (See Comments)  ?  Bilateral shoulder pain.  ? Codeine Rash  ? Penicillins Rash  ?  Has patient had a PCN reaction causing immediate rash, facial/tongue/throat swelling, SOB or lightheadedness with hypotension:Yes ?Has patient had a PCN reaction causing severe rash involving mucus membranes or skin necrosis:Yes ?Has patient had a PCN reaction that required hospitalization:No ?Has patient had a PCN reaction occurring within the last 10 years:No ?If all of the above answers are "NO", then may proceed with Cephalosporin use. ?  ? ? ? ?Current Outpatient Medications  ?Medication Sig Dispense Refill  ? Alirocumab (PRALUENT) 75 MG/ML SOAJ Inject 75 mg into the skin every 14 (fourteen) days. 2 mL 1  ? aspirin EC 81 MG tablet Take 81 mg by mouth daily.    ? clopidogrel (PLAVIX) 75 MG tablet Take 1 tablet (75 mg total) by mouth daily. HOLD WHILE ON BRILINTA 30 tablet 11  ? Continuous Blood Gluc Receiver (FREESTYLE LIBRE 2 READER) DEVI 1 Device by Does not apply route continuous. 1 each 0  ? Continuous Blood Gluc Sensor (FREESTYLE LIBRE 2 SENSOR) MISC 1 Device by Does not apply route every 14 (fourteen) days. 2 each 5  ? ezetimibe (ZETIA) 10 MG tablet Take 1 tablet (10 mg total) by mouth daily. 90 tablet 1  ? FLUoxetine (PROZAC) 40 MG capsule Take 1 capsule (40 mg total) by mouth daily. 90 capsule 1  ? levocetirizine (XYZAL) 5 MG tablet TAKE 1 TABLET BY MOUTH EVERY DAY IN THE EVENING 90 tablet 1  ? losartan (COZAAR) 50 MG tablet Take 1 tablet (50 mg total) by mouth daily. 30 tablet 2  ? metFORMIN (GLUCOPHAGE) 500 MG tablet TAKE 2 TABLETS DAILY WITH  BREAKFAST AND 1 TABLET DAILY WITH SUPPER. 270 tablet 1  ? mirtazapine (REMERON) 7.5 MG tablet TAKE 1 TABLET BY MOUTH AT BEDTIME. 90 tablet 0  ? montelukast (SINGULAIR) 10 MG tablet Take 1 tablet (10 mg total) by mouth at bedtime. 90 tablet 1  ? rOPINIRole (REQUIP) 0.25 MG tablet TAKE 1-2 TABLETS (0.25-0.5 MG TOTAL) BY MOUTH AT BEDTIME. 60 tablet 0  ? rosuvastatin (CRESTOR) 40 MG tablet Take 1 tablet (40 mg total) by mouth daily. 90 tablet 1  ? senna-docusate (SENOKOT-S) 8.6-50 MG tablet Take 1 tablet by mouth at bedtime as needed for mild constipation.    ? ?No current facility-administered medications for this visit.  ? ? ? ?Past Medical History:  ?Diagnosis Date  ? Anxiety   ? Arterial occlusive disease Nov. 2014  ? Arthritis   ? Claudication of lower extremity [facility]) Nov. 2014  ? Right Lower Extremity rest pain  ? Colon polyps   ? Depression   ? Diabetes mellitus without complication ([place])   ? GERD (gastroesophageal reflux disease)   ? Hyperlipidemia   ? Migraines   ? Vertigo   ? ? ?ROS: ? ? All systems reviewed and negative except as noted in the HPI. ? ? ?Past Surgical History:  ?Procedure Laterality Date  ? ABDOMINAL AORTAGRAM N/A 01/10/2013  ? Procedure: ABDOMINAL AORTAGRAM;  Surgeon: Rosetta Posner, MD;  Location: [facility] CATH LAB;  Service: Cardiovascular;  Laterality: N/A;  ? ABDOMINAL AORTOGRAM W/LOWER EXTREMITY Bilateral 07/16/2018  ? Procedure: ABDOMINAL AORTOGRAM W/LOWER EXTREMITY;  Surgeon: Angelia Mould, MD;  Location: [place] CV LAB;  Service: Cardiovascular;  Laterality: Bilateral;  ? ABDOMINAL AORTOGRAM W/LOWER EXTREMITY N/A 10/05/2020  ? Procedure: ABDOMINAL AORTOGRAM W/LOWER EXTREMITY;  Surgeon: Angelia Mould, MD;  Location: [place] CV LAB;  Service: Cardiovascular;  Laterality: N/A;  ? BUBBLE STUDY  03/26/2021  ? Procedure: BUBBLE STUDY;  Surgeon: Donato Heinz, MD;  Location: [place];  Service: Cardiovascular;;  ? CHOLECYSTECTOMY    ? Gall Bladder  ? iliac artery  angioplasty and stent placement  01/10/13  ? LOWER EXTREMITY ANGIOGRAM Bilateral 01/10/2013  ? Procedure: LOWER EXTREMITY ANGIOGRAM;  Surgeon: Rosetta Posner, MD;  Location: [facility] CATH LAB;  Service: Cardiovascular;  Laterality: Bilateral;  ? PERCUTANEOUS STENT INTERVENTION Right 01/10/2013  ? Procedure: PERCUTANEOUS STENT INTERVENTION;  Surgeon: Rosetta Posner, MD;  Location: [facility] CATH LAB;  Service: Cardiovascular;  Laterality: Right;  rt common iliac stent  ? PERIPHERAL VASCULAR CATHETERIZATION N/A 11/26/2015  ? Procedure: Abdominal Aortogram w/Lower Extremity;  Surgeon: Angelia Mould, MD;  Location: [place] CV LAB;  Service: Cardiovascular;  Laterality: N/A;  ? PERIPHERAL VASCULAR CATHETERIZATION Right 11/26/2015  ? Procedure: Peripheral Vascular Balloon Angioplasty;  Surgeon: Angelia Mould, MD;  Location: [place] CV LAB;  Service: Cardiovascular;  Laterality: Right;  rt common iliac  ? PERIPHERAL VASCULAR INTERVENTION  07/16/2018  ? Procedure: PERIPHERAL VASCULAR INTERVENTION;  Surgeon: Angelia Mould, MD;  Location: [place] CV LAB;  Service: Cardiovascular;;  ? PERIPHERAL VASCULAR INTERVENTION Right 10/05/2020  ? Procedure: PERIPHERAL VASCULAR INTERVENTION;  Surgeon: Angelia Mould, MD;  Location: [place] CV LAB;  Service: Cardiovascular;  Laterality: Right;  Common and exteral iliac artery  ? TEE WITHOUT CARDIOVERSION N/A 03/26/2021  ? Procedure: TRANSESOPHAGEAL ECHOCARDIOGRAM (TEE);  Surgeon: Donato Heinz, MD;  Location: [facility] ENDOSCOPY;  Service: Cardiovascular;  Laterality: N/A;  ? TOTAL ABDOMINAL HYSTERECTOMY    ? ? ? ?Family History  ?Problem Relation Age of Onset  ? Diabetes Mother   ? Hyperlipidemia Mother   ? Hypertension Mother   ? Varicose Veins Mother   ? COPD Mother   ? Stroke Mother   ? Anxiety disorder Mother   ? Depression Mother   ? Ovarian cancer Mother   ? Acute myelogenous leukemia Father   ? Diabetes Brother   ? Hypertension Brother   ?  Hyperlipidemia Brother   ? Stroke Maternal Grandmother   ? Anxiety disorder Maternal Grandmother   ? Depression Maternal Grandmother   ? Breast cancer Maternal Grandmother   ? Diabetes Maternal Grandmother   ? Heart disease Maternal Grandfather   ? Lung cancer Paternal Grandfather   ? Other Brother   ?     Blood disease  ? Anxiety disorder Daughter   ? Migraines Daughter   ? Migraines Daughter   ? Migraines Son   ? Seizures Son   ? ? ? ?Social History  ? ?Socioeconomic History  ? Marital status: Married  ?  Spouse name: Not on file  ? Number of children: 3  ? Years of education: Not on file  ? Highest education level: Not on file  ?Occupational History  ? Not on file  ?Tobacco Use  ? Smoking status: Former  ?  Packs/day: 0.25  ?  Types: Cigarettes  ?  Start date: 05/16/1977  ?  Quit date: 03/21/2021  ?  Years since quitting: 0.2  ? Smokeless tobacco: Never  ? Tobacco comments:  ?  1/2 cigarette per day  ?Vaping Use  ? Vaping Use: Never used  ?Substance and Sexual Activity  ? Alcohol use: Yes  ?  Alcohol/week: 1.0 standard drink  ?  Types: 1 Shots of liquor per week  ?  Comment: occ  ? Drug use: No  ? Sexual activity: Yes  ?  Partners: Male  ?  Birth control/protection: Surgical  ?Other Topics Concern  ? Not on file  ?Social History Narrative  ? Not on file  ? ?Social Determinants of Health  ? ?Financial Resource Strain: Not on file  ?Food Insecurity: Not on file  ?Transportation Needs: Not on file  ?Physical Activity: Not on file  ?Stress: Not on file  ?Social Connections: Not on file  ?Intimate Partner Violence: Not on file  ? ? ? ?BP 138/74   Pulse 82   Ht '5\' 1"'$  (1.549 m)   Wt 145 lb 0.3 oz (65.8 kg)   SpO2 97%   BMI 27.40 kg/m?  ? ?Physical Exam: ? ?Well appearing NAD ?HEENT: Unremarkable ?Neck:  No JVD, no thyromegally ?Lymphatics:  No adenopathy ?Back:  No CVA tenderness ?Lungs:  Clear ?HEART:  Regular rate rhythm, no murmurs, no rubs, no clicks ?Abd:  soft, positive bowel sounds, no organomegally, no  rebound, no guarding ?Ext:  2 plus pulses, no edema, no cyanosis, no clubbing ?Skin:  No rashes no nodules ?Neuro:  CN II through XII intact, motor grossly intact ? ? ?Assess/Plan:  ? ?Cryptogenic stroke - I have discussed the

## 2021-06-10 NOTE — Patient Instructions (Addendum)
Medication Instructions:  ?Your physician recommends that you continue on your current medications as directed. Please refer to the Current Medication list given to you today. ? ?Labwork: ?None ordered. ? ?Testing/Procedures: ?None ordered. ? ?Follow-Up: ? ?Your physician wants you to follow-up in: as needed ? ?Implantable Loop Recorder Placement, Care After ?This sheet gives you information about how to care for yourself after your procedure. Your health care provider may also give you more specific instructions. If you have problems or questions, contact your health care provider. ?What can I expect after the procedure? ?After the procedure, it is common to have: ?Soreness or discomfort near the incision. ?Some swelling or bruising near the incision. ? ?Follow these instructions at home: ?Incision care ? ?Monitor your cardiac device site for redness, swelling, and drainage. Call the device clinic at [phone] if you experience these symptoms or fever/chills. ? ?Keep the large square bandage on your site for 24 hours and then you may remove it yourself. Keep the steri-strips underneath in place.  ? ?You may shower after 72 hours / 3 days from your procedure with the steri-strips in place. They will usually fall off on their own, or may be removed after 10 days. Pat dry.  ? ?Avoid lotions, ointments, or perfumes over your incision until it is well-healed. ? ?Please do not submerge in water until your site is completely healed.  ? ?Your device is MRI compatible.  ? ?Remote monitoring is used to monitor your cardiac device from home. This monitoring is scheduled every month by our office. It allows [place] to keep an eye on the function of your device to ensure it is working properly. ? ?If your wound site starts to bleed apply pressure.   ?   ?If you have any questions/concerns please call the device clinic at [phone]. ? ?Activity ? ?Return to your normal activities. ? ?General instructions ?Follow instructions  from your health care provider about how to manage your implantable loop recorder and transmit the information. Learn how to activate a recording if this is necessary for your type of device. ?You may go through a metal detection gate, and you may let someone hold a metal detector over your chest. Show your ID card if needed. ?Do not have an MRI unless you check with your health care provider first. ?Take over-the-counter and prescription medicines only as told by your health care provider. ?Keep all follow-up visits as told by your health care provider. This is important. ?Contact a health care provider if: ?You have redness, swelling, or pain around your incision. ?You have a fever. ?You have pain that is not relieved by your pain medicine. ?You have triggered your device because of fainting (syncope) or because of a heartbeat that feels like it is racing, slow, fluttering, or skipping (palpitations). ?Get help right away if you have: ?Chest pain. ?Difficulty breathing. ?Summary ?After the procedure, it is common to have soreness or discomfort near the incision. ?Change your dressing as told by your health care provider. ?Follow instructions from your health care provider about how to manage your implantable loop recorder and transmit the information. ?Keep all follow-up visits as told by your health care provider. This is important. ?This information is not intended to replace advice given to you by your health care provider. Make sure you discuss any questions you have with your health care provider. ?Document Released: 01/01/2015 Document Revised: 03/07/2017 Document Reviewed: 03/07/2017 ?Elsevier Patient Education ? [place]. ? ?

## 2021-06-15 ENCOUNTER — Other Ambulatory Visit: Payer: Self-pay | Admitting: Family Medicine

## 2021-06-15 DIAGNOSIS — G2581 Restless legs syndrome: Secondary | ICD-10-CM

## 2021-06-18 NOTE — Telephone Encounter (Signed)
Please advise patient she is due for a chronic follow-up. ?

## 2021-06-18 NOTE — Telephone Encounter (Signed)
Pt aware refills, appt made 07/18/21 ?

## 2021-06-26 ENCOUNTER — Other Ambulatory Visit: Payer: Self-pay | Admitting: Family Medicine

## 2021-06-26 DIAGNOSIS — I10 Essential (primary) hypertension: Secondary | ICD-10-CM

## 2021-07-02 ENCOUNTER — Other Ambulatory Visit: Payer: Self-pay | Admitting: Family Medicine

## 2021-07-02 DIAGNOSIS — G2581 Restless legs syndrome: Secondary | ICD-10-CM

## 2021-07-04 ENCOUNTER — Other Ambulatory Visit: Payer: Self-pay

## 2021-07-04 NOTE — Patient Outreach (Signed)
[facility]) Care Management  07/04/2021  KENNETHA PEARMAN 1965/10/09 [id number]   Telephone outreach to patient to obtain mRS was successfully completed. MRS= [place] Management Assistant [phone]

## 2021-07-15 ENCOUNTER — Ambulatory Visit (INDEPENDENT_AMBULATORY_CARE_PROVIDER_SITE_OTHER): Payer: 59

## 2021-07-15 DIAGNOSIS — I639 Cerebral infarction, unspecified: Secondary | ICD-10-CM | POA: Diagnosis not present

## 2021-07-16 LAB — CUP PACEART REMOTE DEVICE CHECK
Date Time Interrogation Session: 20230610120105
Implantable Pulse Generator Implant Date: 20230508

## 2021-07-18 ENCOUNTER — Ambulatory Visit: Payer: 59 | Admitting: Family Medicine

## 2021-07-20 ENCOUNTER — Other Ambulatory Visit: Payer: Self-pay | Admitting: Family Medicine

## 2021-07-20 DIAGNOSIS — I10 Essential (primary) hypertension: Secondary | ICD-10-CM

## 2021-07-24 ENCOUNTER — Other Ambulatory Visit: Payer: Self-pay | Admitting: Family Medicine

## 2021-07-24 DIAGNOSIS — E1169 Type 2 diabetes mellitus with other specified complication: Secondary | ICD-10-CM

## 2021-07-26 ENCOUNTER — Ambulatory Visit: Payer: 59 | Admitting: Family Medicine

## 2021-08-01 ENCOUNTER — Ambulatory Visit (INDEPENDENT_AMBULATORY_CARE_PROVIDER_SITE_OTHER): Payer: 59 | Admitting: Family Medicine

## 2021-08-01 ENCOUNTER — Encounter: Payer: Self-pay | Admitting: Family Medicine

## 2021-08-01 VITALS — BP 125/73 | HR 75 | Temp 97.8°F | Ht 61.0 in | Wt 174.0 lb

## 2021-08-01 DIAGNOSIS — Z8673 Personal history of transient ischemic attack (TIA), and cerebral infarction without residual deficits: Secondary | ICD-10-CM | POA: Diagnosis not present

## 2021-08-01 DIAGNOSIS — F411 Generalized anxiety disorder: Secondary | ICD-10-CM

## 2021-08-01 DIAGNOSIS — E785 Hyperlipidemia, unspecified: Secondary | ICD-10-CM

## 2021-08-01 DIAGNOSIS — I739 Peripheral vascular disease, unspecified: Secondary | ICD-10-CM | POA: Diagnosis not present

## 2021-08-01 DIAGNOSIS — R630 Anorexia: Secondary | ICD-10-CM | POA: Insufficient documentation

## 2021-08-01 DIAGNOSIS — F331 Major depressive disorder, recurrent, moderate: Secondary | ICD-10-CM

## 2021-08-01 DIAGNOSIS — J302 Other seasonal allergic rhinitis: Secondary | ICD-10-CM

## 2021-08-01 DIAGNOSIS — I10 Essential (primary) hypertension: Secondary | ICD-10-CM | POA: Diagnosis not present

## 2021-08-01 DIAGNOSIS — R69 Illness, unspecified: Secondary | ICD-10-CM | POA: Diagnosis not present

## 2021-08-01 DIAGNOSIS — G479 Sleep disorder, unspecified: Secondary | ICD-10-CM

## 2021-08-01 DIAGNOSIS — E782 Mixed hyperlipidemia: Secondary | ICD-10-CM

## 2021-08-01 DIAGNOSIS — E1169 Type 2 diabetes mellitus with other specified complication: Secondary | ICD-10-CM | POA: Diagnosis not present

## 2021-08-01 DIAGNOSIS — I6930 Unspecified sequelae of cerebral infarction: Secondary | ICD-10-CM | POA: Insufficient documentation

## 2021-08-01 LAB — BAYER DCA HB A1C WAIVED: HB A1C (BAYER DCA - WAIVED): 6.2 % — ABNORMAL HIGH (ref 4.8–5.6)

## 2021-08-01 MED ORDER — MONTELUKAST SODIUM 10 MG PO TABS: 10.0000 mg | ORAL_TABLET | Freq: Every day | ORAL | 1 refills | Status: DC

## 2021-08-01 MED ORDER — ROSUVASTATIN CALCIUM 40 MG PO TABS: 40.0000 mg | ORAL_TABLET | Freq: Every day | ORAL | 1 refills | Status: DC

## 2021-08-01 MED ORDER — LEVOCETIRIZINE DIHYDROCHLORIDE 5 MG PO TABS: 5.0000 mg | ORAL_TABLET | Freq: Every evening | ORAL | 1 refills | Status: DC

## 2021-08-01 MED ORDER — FLUOXETINE HCL 40 MG PO CAPS: 40.0000 mg | ORAL_CAPSULE | Freq: Every day | ORAL | 1 refills | Status: DC

## 2021-08-01 MED ORDER — EZETIMIBE 10 MG PO TABS: 10.0000 mg | ORAL_TABLET | Freq: Every day | ORAL | 1 refills | Status: DC

## 2021-08-01 MED ORDER — MIRTAZAPINE 7.5 MG PO TABS: 7.5000 mg | ORAL_TABLET | Freq: Every day | ORAL | 1 refills | Status: DC

## 2021-08-01 MED ORDER — LOSARTAN POTASSIUM 50 MG PO TABS: 50.0000 mg | ORAL_TABLET | Freq: Every day | ORAL | 1 refills | Status: DC

## 2021-08-01 MED ORDER — METFORMIN HCL 500 MG PO TABS: ORAL_TABLET | ORAL | 1 refills | Status: DC

## 2021-08-01 NOTE — Progress Notes (Signed)
Assessment & Plan:   Problem List Items Addressed This Visit       Cardiovascular and Mediastinum   PAD (peripheral artery disease) (HCC)    Continue current regimen of aspirin, Plavix, Zetia, and Rosuvastatin. Our pharmacist is looking into Leqvio for patient so that it can go through her medical benefits instead of pharmacy benefits.       Relevant Medications   rosuvastatin (CRESTOR) 40 MG tablet   losartan (COZAAR) 50 MG tablet   ezetimibe (ZETIA) 10 MG tablet   Essential hypertension    Well controlled on current regimen.       Relevant Medications   rosuvastatin (CRESTOR) 40 MG tablet   losartan (COZAAR) 50 MG tablet   ezetimibe (ZETIA) 10 MG tablet   Other Relevant Orders   Lipid panel (Completed)   CBC with Differential/Platelet (Completed)   CMP14+EGFR (Completed)     Endocrine   DM type 2 with diabetic dyslipidemia ([facility]) - Primary    Lab Results  Component Value Date   HGBA1C 6.2 (H) 08/01/2021   HGBA1C 6.2 (H) 04/11/2021   HGBA1C 6.1 (H) 03/22/2021    - Diabetes is at goal of A1c < 6.5. - Medications: continue current medications - Home glucose monitoring: continue monitoring - Patient is currently taking a statin. Patient is taking an ACE-inhibitor/ARB.  - Instruction/counseling given: discussed foot care  - Record release sent for diabetic eye exam.   Diabetes Health Maintenance Due  Topic Date Due   OPHTHALMOLOGY EXAM  04/14/2021   HEMOGLOBIN A1C  01/31/2022   FOOT EXAM  08/02/2022    Lab Results  Component Value Date   LABMICR 8.9 07/04/2020   LABMICR 7.4 06/29/2019       Relevant Medications   rosuvastatin (CRESTOR) 40 MG tablet   metFORMIN (GLUCOPHAGE) 500 MG tablet   losartan (COZAAR) 50 MG tablet   ezetimibe (ZETIA) 10 MG tablet   Other Relevant Orders   Lipid panel (Completed)   CBC with Differential/Platelet (Completed)   CMP14+EGFR (Completed)   Bayer DCA Hb A1c Waived (Completed)   Vitamin B12 (Completed)     Other    Anxiety, generalized    Well controlled on current regimen of Prozac and Remeron.      Relevant Medications   mirtazapine (REMERON) 7.5 MG tablet   FLUoxetine (PROZAC) 40 MG capsule   Other Relevant Orders   CMP14+EGFR (Completed)   Major depressive disorder, recurrent episode, moderate (HCC)    Well controlled on current regimen of Prozac and Remeron.      Relevant Medications   mirtazapine (REMERON) 7.5 MG tablet   FLUoxetine (PROZAC) 40 MG capsule   Other Relevant Orders   CMP14+EGFR (Completed)   Hyperlipidemia    Labs to assess. Continue Zetia and Rosuvastatin. Our pharmacist is looking into Leqvio for patient so that it can go through her medical benefits instead of pharmacy benefits.       Relevant Medications   rosuvastatin (CRESTOR) 40 MG tablet   losartan (COZAAR) 50 MG tablet   ezetimibe (ZETIA) 10 MG tablet   Other Relevant Orders   Lipid panel (Completed)   CBC with Differential/Platelet (Completed)   CMP14+EGFR (Completed)   Seasonal allergies    Well controlled on current regimen of Xyzal and Singulair.      Relevant Medications   levocetirizine (XYZAL) 5 MG tablet   montelukast (SINGULAIR) 10 MG tablet   History of recent stroke    PT and OT completed. Continue aspirin, Plavix,  Zetia, and Rosuvastatin. Our pharmacist is looking into Leqvio for patient so that it can go through her medical benefits instead of pharmacy benefits. Maintain follow-ups with cardiology and neurology.      Relevant Medications   rosuvastatin (CRESTOR) 40 MG tablet   ezetimibe (ZETIA) 10 MG tablet   Difficulty sleeping    Well controlled on current regimen of Remeron.      Relevant Medications   mirtazapine (REMERON) 7.5 MG tablet   Other Relevant Orders   CMP14+EGFR (Completed)   Decreased appetite    Improved with Remeron.      Relevant Medications   mirtazapine (REMERON) 7.5 MG tablet   Other Relevant Orders   CMP14+EGFR (Completed)    Return in about 6 months  (around 01/31/2022) for annual physical.  Hannah Limes, MSN, APRN, FNP-C [facility]  Subjective:    Patient ID: Hannah Mcdaniel, female    DOB: 1965-05-23, 56 y.o.   MRN: [id number]  Patient Care Team: [name], FNP as PCP - General (Family Medicine) Sueanne Margarita, MD as PCP - Cardiology (Cardiology) Angelia Mould, MD as Consulting Physician (Vascular Surgery) Harlen Labs, MD as Referring Physician (Optometry)   Chief Complaint:  Chief Complaint  Patient presents with   Medical Management of Chronic Issues    HPI: Hannah Mcdaniel is a 56 y.o. female presenting on 08/01/2021 for Medical Management of Chronic Issues  Diabetes: Patient presents for follow up of diabetes. Current symptoms include: none. Known diabetic complications: cardiovascular disease, cerebrovascular disease, and peripheral vascular disease. Medication compliance: yes. Current diet: in general, a "healthy" diet  . Current exercise: walking. Home blood sugar records: BGs are running  consistent with Hgb A1C, states they are under 100 every morning . Is she on ACE inhibitor or angiotensin II receptor blocker? Yes (Losartan). Is she on a statin? Yes (Rosuvastatin).   History of CVA: has has had three strokes. Previously Praluent was approved through insurance. She started it and has been using it until her dose that was due yesterday. She went to pick up the medication and it is going to cost >$500. She is taking aspirin, Plavix, Zetia, and Rosuvastatin. She has graduated from PT and OT. She saw cardiology on 06/10/2021 where an ILR was inserted.   Restless Legs: controlled with ropinirole.   Difficulty Sleeping: started on Remeron three months ago. She is getting good sleep at night.  Depression/Anxiety: patient feels Prozac 40 mg with the Wellbutrin 450 mg is working well for her. She has previously taken Ativan, Xanax, Celexa, and Lexapro.      08/01/2021   10:59 AM  04/19/2021   11:30 AM 04/03/2021    9:19 AM  Depression screen PHQ 2/9  Decreased Interest 1 1 0  Down, Depressed, Hopeless 0 1 1  PHQ - 2 Score _0 Altered sleeping _1 Tired, decreased energy _2 Change in appetite 0 3 2  Feeling bad or failure about yourself  0 1 1  Trouble concentrating 0 0 0  Moving slowly or fidgety/restless 0 0 0  Suicidal thoughts 0 0 0  PHQ-9 Score _3 Difficult doing work/chores Somewhat difficult Somewhat difficult Somewhat difficult      08/01/2021   11:00 AM 04/19/2021   11:30 AM 04/03/2021    9:20 AM 03/28/2021   11:03 AM  GAD 7 : Generalized Anxiety Score  Nervous, Anxious, on Edge 0 0  2 3  Control/stop worrying 0 0 0 2  Worry too much - different things 0 0 0 1  Trouble relaxing _0 Restless 3 0 2 1  Easily annoyed or irritable 0 0 3 3  Afraid - awful might happen 0 0 0 0  Total GAD 7 Score _1 Anxiety Difficulty Somewhat difficult Somewhat difficult Somewhat difficult Somewhat difficult    New complaints: None   Social history:  Relevant past medical, surgical, family and social history reviewed and updated as indicated. Interim medical history since our last visit reviewed.  Allergies and medications reviewed and updated.  DATA REVIEWED: CHART IN EPIC  ROS: Negative unless specifically indicated above in HPI.    Current Outpatient Medications:    aspirin EC 81 MG tablet, Take 81 mg by mouth daily., Disp: , Rfl:    clopidogrel (PLAVIX) 75 MG tablet, Take 1 tablet (75 mg total) by mouth daily. HOLD WHILE ON BRILINTA, Disp: 30 tablet, Rfl: 11   Continuous Blood Gluc Receiver (FREESTYLE LIBRE 2 READER) DEVI, 1 Device by Does not apply route continuous., Disp: 1 each, Rfl: 0   Continuous Blood Gluc Sensor (FREESTYLE LIBRE 2 SENSOR) MISC, 1 Device by Does not apply route every 14 (fourteen) days., Disp: 2 each, Rfl: 5   ezetimibe (ZETIA) 10 MG tablet, Take 1 tablet (10 mg total) by mouth daily., Disp: 90 tablet, Rfl:  1   FLUoxetine (PROZAC) 40 MG capsule, Take 1 capsule (40 mg total) by mouth daily., Disp: 90 capsule, Rfl: 1   levocetirizine (XYZAL) 5 MG tablet, TAKE 1 TABLET BY MOUTH EVERY DAY IN THE EVENING, Disp: 90 tablet, Rfl: 1   losartan (COZAAR) 50 MG tablet, TAKE 1 TABLET BY MOUTH EVERY DAY, Disp: 30 tablet, Rfl: 0   metFORMIN (GLUCOPHAGE) 500 MG tablet, TAKE 2 TABLETS DAILY WITH BREAKFAST AND 1 TABLET DAILY WITH SUPPER., Disp: 270 tablet, Rfl: 0   mirtazapine (REMERON) 7.5 MG tablet, TAKE 1 TABLET BY MOUTH AT BEDTIME., Disp: 90 tablet, Rfl: 0   montelukast (SINGULAIR) 10 MG tablet, Take 1 tablet (10 mg total) by mouth at bedtime., Disp: 90 tablet, Rfl: 1   rOPINIRole (REQUIP) 0.25 MG tablet, TAKE 1-2 TABLETS (0.25-0.5 MG TOTAL) BY MOUTH AT BEDTIME., Disp: 60 tablet, Rfl: 0   rosuvastatin (CRESTOR) 40 MG tablet, Take 1 tablet (40 mg total) by mouth daily., Disp: 90 tablet, Rfl: 1   senna-docusate (SENOKOT-S) 8.6-50 MG tablet, Take 1 tablet by mouth at bedtime as needed for mild constipation., Disp: , Rfl:    PRALUENT 75 MG/ML SOAJ, INJECT 75 MG SUBCUTANEOUSLY EVERY 14 DAYS (Patient not taking: Reported on 08/01/2021), Disp: 2 mL, Rfl: 2   Allergies  Allergen Reactions   Asa [Aspirin] Rash    Can take low-dose aspirin.   Bempedoic Acid Other (See Comments)    Bilateral shoulder pain.   Codeine Rash   Penicillins Rash    Has patient had a PCN reaction causing immediate rash, facial/tongue/throat swelling, SOB or lightheadedness with hypotension:Yes Has patient had a PCN reaction causing severe rash involving mucus membranes or skin necrosis:Yes Has patient had a PCN reaction that required hospitalization:No Has patient had a PCN reaction occurring within the last 10 years:No If all of the above answers are "NO", then may proceed with Cephalosporin use.    Past Medical History:  Diagnosis Date   Anxiety    Arterial occlusive disease Nov. 2014   Arthritis    Claudication  of lower extremity  ([place]) Nov. 2014   Right Lower Extremity rest pain   Colon polyps    Depression    Diabetes mellitus without complication ([place])    GERD (gastroesophageal reflux disease)    Hyperlipidemia    Migraines    Vertigo     Past Surgical History:  Procedure Laterality Date   ABDOMINAL AORTAGRAM N/A 01/10/2013   Procedure: ABDOMINAL AORTAGRAM;  Surgeon: Rosetta Posner, MD;  Location: [facility] CATH LAB;  Service: Cardiovascular;  Laterality: N/A;   ABDOMINAL AORTOGRAM W/LOWER EXTREMITY Bilateral 07/16/2018   Procedure: ABDOMINAL AORTOGRAM W/LOWER EXTREMITY;  Surgeon: Angelia Mould, MD;  Location: [place] CV LAB;  Service: Cardiovascular;  Laterality: Bilateral;   ABDOMINAL AORTOGRAM W/LOWER EXTREMITY N/A 10/05/2020   Procedure: ABDOMINAL AORTOGRAM W/LOWER EXTREMITY;  Surgeon: Angelia Mould, MD;  Location: [place] CV LAB;  Service: Cardiovascular;  Laterality: N/A;   BUBBLE STUDY  03/26/2021   Procedure: BUBBLE STUDY;  Surgeon: Donato Heinz, MD;  Location: [facility] ENDOSCOPY;  Service: Cardiovascular;;   CHOLECYSTECTOMY     Gall Bladder   iliac artery angioplasty and stent placement  01/10/13   LOWER EXTREMITY ANGIOGRAM Bilateral 01/10/2013   Procedure: LOWER EXTREMITY ANGIOGRAM;  Surgeon: Rosetta Posner, MD;  Location: [facility] CATH LAB;  Service: Cardiovascular;  Laterality: Bilateral;   PERCUTANEOUS STENT INTERVENTION Right 01/10/2013   Procedure: PERCUTANEOUS STENT INTERVENTION;  Surgeon: Rosetta Posner, MD;  Location: [facility] CATH LAB;  Service: Cardiovascular;  Laterality: Right;  rt common iliac stent   PERIPHERAL VASCULAR CATHETERIZATION N/A 11/26/2015   Procedure: Abdominal Aortogram w/Lower Extremity;  Surgeon: Angelia Mould, MD;  Location: [place] CV LAB;  Service: Cardiovascular;  Laterality: N/A;   PERIPHERAL VASCULAR CATHETERIZATION Right 11/26/2015   Procedure: Peripheral Vascular Balloon Angioplasty;  Surgeon: Angelia Mould, MD;  Location: [place] CV LAB;   Service: Cardiovascular;  Laterality: Right;  rt common iliac   PERIPHERAL VASCULAR INTERVENTION  07/16/2018   Procedure: PERIPHERAL VASCULAR INTERVENTION;  Surgeon: Angelia Mould, MD;  Location: [place] CV LAB;  Service: Cardiovascular;;   PERIPHERAL VASCULAR INTERVENTION Right 10/05/2020   Procedure: PERIPHERAL VASCULAR INTERVENTION;  Surgeon: Angelia Mould, MD;  Location: [place] CV LAB;  Service: Cardiovascular;  Laterality: Right;  Common and exteral iliac artery   TEE WITHOUT CARDIOVERSION N/A 03/26/2021   Procedure: TRANSESOPHAGEAL ECHOCARDIOGRAM (TEE);  Surgeon: Donato Heinz, MD;  Location: [facility] ENDOSCOPY;  Service: Cardiovascular;  Laterality: N/A;   TOTAL ABDOMINAL HYSTERECTOMY      Social History   Socioeconomic History   Marital status: Married    Spouse name: Not on file   Number of children: 3   Years of education: Not on file   Highest education level: Not on file  Occupational History   Not on file  Tobacco Use   Smoking status: Former    Packs/day: 0.25    Types: Cigarettes    Start date: 05/16/1977    Quit date: 03/21/2021    Years since quitting: 0.3   Smokeless tobacco: Never   Tobacco comments:    1/2 cigarette per day  Vaping Use   Vaping Use: Never used  Substance and Sexual Activity   Alcohol use: Yes    Alcohol/week: 1.0 standard drink of alcohol    Types: 1 Shots of liquor per week    Comment: occ   Drug use: No   Sexual activity: Yes    Partners: Male    Birth control/protection:  Surgical  Other Topics Concern   Not on file  Social History Narrative   Not on file   Social Determinants of Health   Financial Resource Strain: Not on file  Food Insecurity: Not on file  Transportation Needs: Not on file  Physical Activity: Not on file  Stress: Not on file  Social Connections: Not on file  Intimate Partner Violence: Not on file        Objective:    BP 125/73   Pulse 75   Temp 97.8 F (36.6 C) (Temporal)    Ht 5' 1" (1.549 m)   Wt 174 lb (78.9 kg)   SpO2 95%   BMI 32.88 kg/m   Wt Readings from Last 3 Encounters:  08/01/21 174 lb (78.9 kg)  06/10/21 145 lb 0.3 oz (65.8 kg)  05/10/21 140 lb (63.5 kg)   Physical Exam Vitals reviewed.  Constitutional:      General: She is not in acute distress.    Appearance: Normal appearance. She is obese. She is not ill-appearing, toxic-appearing or diaphoretic.  HENT:     Head: Normocephalic and atraumatic.  Eyes:     General: No scleral icterus.       Right eye: No discharge.        Left eye: No discharge.     Conjunctiva/sclera: Conjunctivae normal.  Cardiovascular:     Rate and Rhythm: Normal rate and regular rhythm.     Heart sounds: Normal heart sounds. No murmur heard.    No friction rub. No gallop.  Pulmonary:     Effort: Pulmonary effort is normal. No respiratory distress.     Breath sounds: Normal breath sounds. No stridor. No wheezing, rhonchi or rales.  Musculoskeletal:        General: Normal range of motion.     Cervical back: Normal range of motion.  Skin:    General: Skin is warm and dry.     Capillary Refill: Capillary refill takes less than 2 seconds.  Neurological:     General: No focal deficit present.     Mental Status: She is alert and oriented to person, place, and time. Mental status is at baseline.     Gait: Gait abnormal (walking with cane).  Psychiatric:        Mood and Affect: Mood normal.        Behavior: Behavior normal.        Thought Content: Thought content normal.        Judgment: Judgment normal.    Diabetic Foot Exam - Simple   Simple Foot Form Diabetic Foot exam was performed with the following findings: Yes 08/01/2021 11:19 AM  Visual Inspection No deformities, no ulcerations, no other skin breakdown bilaterally: Yes Sensation Testing Intact to touch and monofilament testing bilaterally: Yes Pulse Check Posterior Tibialis and Dorsalis pulse intact bilaterally: Yes Comments     Lab Results   Component Value Date   TSH 1.130 04/03/2021   Lab Results  Component Value Date   WBC 10.9 (H) 04/11/2021   HGB 12.7 04/11/2021   HCT 35.6 (L) 04/11/2021   MCV 87.7 04/11/2021   PLT 281 04/11/2021   Lab Results  Component Value Date   NA 139 04/11/2021   K 3.9 04/11/2021   CO2 25 04/11/2021   GLUCOSE 167 (H) 04/11/2021   BUN 13 04/11/2021   CREATININE 0.97 04/11/2021   BILITOT 0.5 04/11/2021   ALKPHOS 91 04/11/2021   AST 26 04/11/2021   ALT 23 04/11/2021  PROT 6.8 04/11/2021   ALBUMIN 3.8 04/11/2021   CALCIUM 9.5 04/11/2021   ANIONGAP 7 04/11/2021   EGFR 82 04/03/2021   Lab Results  Component Value Date   CHOL 89 04/11/2021   Lab Results  Component Value Date   HDL 42 04/11/2021   Lab Results  Component Value Date   LDLCALC 25 04/11/2021   Lab Results  Component Value Date   TRIG 111 04/11/2021   Lab Results  Component Value Date   CHOLHDL 2.1 04/11/2021   Lab Results  Component Value Date   HGBA1C 6.2 (H) 04/11/2021

## 2021-08-01 NOTE — Patient Instructions (Signed)
Schedule your mammogram;

## 2021-08-02 LAB — CMP14+EGFR
ALT: 12 IU/L (ref 0–32)
AST: 16 IU/L (ref 0–40)
Albumin/Globulin Ratio: 3.3 — ABNORMAL HIGH (ref 1.2–2.2)
Albumin: 5.2 g/dL — ABNORMAL HIGH (ref 3.8–4.9)
Alkaline Phosphatase: 145 IU/L — ABNORMAL HIGH (ref 44–121)
BUN/Creatinine Ratio: 14 (ref 9–23)
BUN: 14 mg/dL (ref 6–24)
Bilirubin Total: 0.3 mg/dL (ref 0.0–1.2)
CO2: 22 mmol/L (ref 20–29)
Calcium: 9.6 mg/dL (ref 8.7–10.2)
Chloride: 105 mmol/L (ref 96–106)
Creatinine, Ser: 0.99 mg/dL (ref 0.57–1.00)
Globulin, Total: 1.6 g/dL (ref 1.5–4.5)
Glucose: 111 mg/dL — ABNORMAL HIGH (ref 70–99)
Potassium: 4.3 mmol/L (ref 3.5–5.2)
Sodium: 140 mmol/L (ref 134–144)
Total Protein: 6.8 g/dL (ref 6.0–8.5)
eGFR: 67 mL/min/{1.73_m2} (ref >59)

## 2021-08-02 LAB — CBC WITH DIFFERENTIAL/PLATELET
Basophils Absolute: 0.1 10*3/uL (ref 0.0–0.2)
Basos: 1 %
EOS (ABSOLUTE): 0.1 10*3/uL (ref 0.0–0.4)
Eos: 1 %
Hematocrit: 41.1 % (ref 34.0–46.6)
Hemoglobin: 13.8 g/dL (ref 11.1–15.9)
Immature Grans (Abs): 0 10*3/uL (ref 0.0–0.1)
Immature Granulocytes: 0 %
Lymphocytes Absolute: 3.3 10*3/uL — ABNORMAL HIGH (ref 0.7–3.1)
Lymphs: 34 %
MCH: 30.4 pg (ref 26.6–33.0)
MCHC: 33.6 g/dL (ref 31.5–35.7)
MCV: 91 fL (ref 79–97)
Monocytes Absolute: 0.4 10*3/uL (ref 0.1–0.9)
Monocytes: 4 %
Neutrophils Absolute: 5.9 10*3/uL (ref 1.4–7.0)
Neutrophils: 60 %
Platelets: 276 10*3/uL (ref 150–450)
RBC: 4.54 x10E6/uL (ref 3.77–5.28)
RDW: 12 % (ref 11.7–15.4)
WBC: 9.7 10*3/uL (ref 3.4–10.8)

## 2021-08-02 LAB — LIPID PANEL
Chol/HDL Ratio: 2.7 ratio (ref 0.0–4.4)
Cholesterol, Total: 128 mg/dL (ref 100–199)
HDL: 48 mg/dL (ref >39)
LDL Chol Calc (NIH): 55 mg/dL (ref 0–99)
Triglycerides: 148 mg/dL (ref 0–149)
VLDL Cholesterol Cal: 25 mg/dL (ref 5–40)

## 2021-08-02 LAB — VITAMIN B12: Vitamin B-12: 269 pg/mL (ref 232–1245)

## 2021-08-04 NOTE — Assessment & Plan Note (Signed)
Well controlled on current regimen of Prozac and Remeron.

## 2021-08-04 NOTE — Assessment & Plan Note (Signed)
Well controlled on current regimen of Remeron.

## 2021-08-04 NOTE — Assessment & Plan Note (Signed)
Well-controlled on current regimen. ?

## 2021-08-04 NOTE — Assessment & Plan Note (Signed)
Continue current regimen of aspirin, Plavix, Zetia, and Rosuvastatin. Our pharmacist is looking into Leqvio for patient so that it can go through her medical benefits instead of pharmacy benefits.

## 2021-08-04 NOTE — Assessment & Plan Note (Signed)
Improved with Remeron.  

## 2021-08-04 NOTE — Assessment & Plan Note (Addendum)
PT and OT completed. Continue aspirin, Plavix, Zetia, and Rosuvastatin. Our pharmacist is looking into Leqvio for patient so that it can go through her medical benefits instead of pharmacy benefits. Maintain follow-ups with cardiology and neurology.

## 2021-08-04 NOTE — Assessment & Plan Note (Signed)
Well controlled on current regimen of Xyzal and Singulair.

## 2021-08-04 NOTE — Assessment & Plan Note (Signed)
Labs to assess. Continue Zetia and Rosuvastatin. Our pharmacist is looking into Leqvio for patient so that it can go through her medical benefits instead of pharmacy benefits.

## 2021-08-04 NOTE — Assessment & Plan Note (Signed)
Lab Results  Component Value Date   HGBA1C 6.2 (H) 08/01/2021   HGBA1C 6.2 (H) 04/11/2021   HGBA1C 6.1 (H) 03/22/2021    - Diabetes is at goal of A1c < 6.5. - Medications: continue current medications - Home glucose monitoring: continue monitoring - Patient is currently taking a statin. Patient is taking an ACE-inhibitor/ARB.  - Instruction/counseling given: discussed foot care  - Record release sent for diabetic eye exam.   Diabetes Health Maintenance Due  Topic Date Due  . OPHTHALMOLOGY EXAM  04/14/2021  . HEMOGLOBIN A1C  01/31/2022  . FOOT EXAM  08/02/2022    Lab Results  Component Value Date   LABMICR 8.9 07/04/2020   LABMICR 7.4 06/29/2019

## 2021-08-05 NOTE — Progress Notes (Signed)
Carelink Summary Report / Loop Recorder 

## 2021-08-19 ENCOUNTER — Ambulatory Visit (INDEPENDENT_AMBULATORY_CARE_PROVIDER_SITE_OTHER): Payer: 59

## 2021-08-19 DIAGNOSIS — I639 Cerebral infarction, unspecified: Secondary | ICD-10-CM | POA: Diagnosis not present

## 2021-08-19 LAB — CUP PACEART REMOTE DEVICE CHECK
Date Time Interrogation Session: 20230713120218
Implantable Pulse Generator Implant Date: 20230508

## 2021-09-05 ENCOUNTER — Other Ambulatory Visit: Payer: Self-pay | Admitting: Nurse Practitioner

## 2021-09-05 DIAGNOSIS — Z1231 Encounter for screening mammogram for malignant neoplasm of breast: Secondary | ICD-10-CM

## 2021-09-09 ENCOUNTER — Encounter: Payer: Self-pay | Admitting: Nurse Practitioner

## 2021-09-09 ENCOUNTER — Ambulatory Visit
Admission: RE | Admit: 2021-09-09 | Discharge: 2021-09-09 | Disposition: A | Discharge status: Home / Self Care | Payer: 59 | Source: Ambulatory Visit | Attending: Family Medicine | Admitting: Family Medicine

## 2021-09-09 ENCOUNTER — Ambulatory Visit: Payer: 59 | Admitting: Nurse Practitioner

## 2021-09-09 VITALS — BP 158/84 | HR 88 | Temp 98.4°F | Ht 61.0 in | Wt 151.4 lb

## 2021-09-09 DIAGNOSIS — Z1231 Encounter for screening mammogram for malignant neoplasm of breast: Secondary | ICD-10-CM

## 2021-09-09 DIAGNOSIS — H6502 Acute serous otitis media, left ear: Secondary | ICD-10-CM

## 2021-09-09 MED ORDER — PREDNISONE 20 MG PO TABS: 20.0000 mg | ORAL_TABLET | Freq: Every day | ORAL | 0 refills | Status: DC

## 2021-09-09 MED ORDER — AZITHROMYCIN 250 MG PO TABS: ORAL_TABLET | ORAL | 0 refills | Status: AC

## 2021-09-09 NOTE — Progress Notes (Signed)
Acute Office Visit  Subjective:     Patient ID: Hannah Mcdaniel, female    DOB: 1965/07/15, 56 y.o.   MRN: [id number]  Chief Complaint  Patient presents with   Ear Pain    Left side for about 2 days     Otalgia  There is pain in the left ear. This is a new problem. The current episode started yesterday. The problem occurs constantly. The problem has been gradually worsening. There has been no fever. The pain is moderate. Associated symptoms include a sore throat. Pertinent negatives include no coughing, diarrhea, headaches, hearing loss, neck pain or rash. She has tried acetaminophen for the symptoms. The treatment provided mild relief.     Review of Systems  Constitutional: Negative.  Negative for chills and fever.  HENT:  Positive for ear pain and sore throat. Negative for hearing loss.   Respiratory:  Negative for cough.   Gastrointestinal:  Negative for diarrhea.  Musculoskeletal:  Negative for neck pain.  Skin: Negative.  Negative for rash.  Neurological:  Negative for headaches.  All other systems reviewed and are negative.       Objective:    BP (!) 158/84   Pulse 88   Temp 98.4 F (36.9 C)   Ht '5\' 1"'$  (1.549 m)   Wt 151 lb 6.4 oz (68.7 kg)   SpO2 94%   BMI 28.61 kg/m  BP Readings from Last 3 Encounters:  09/09/21 (!) 158/84  08/01/21 125/73  06/10/21 138/74   Wt Readings from Last 3 Encounters:  09/09/21 151 lb 6.4 oz (68.7 kg)  08/01/21 174 lb (78.9 kg)  06/10/21 145 lb 0.3 oz (65.8 kg)      Physical Exam Vitals and nursing note reviewed.  Constitutional:      Appearance: Normal appearance.  HENT:     Head: Normocephalic.     Right Ear: Hearing and external ear normal. No decreased hearing noted. Tenderness present. There is no impacted cerumen. No foreign body.     Left Ear: Hearing and external ear normal. Swelling and tenderness present. A middle ear effusion is present.     Nose: Nose normal. No congestion.     Mouth/Throat:     Mouth:  Mucous membranes are moist.     Pharynx: Oropharynx is clear.  Eyes:     Conjunctiva/sclera: Conjunctivae normal.  Cardiovascular:     Rate and Rhythm: Normal rate and regular rhythm.     Pulses: Normal pulses.     Heart sounds: Normal heart sounds.  Pulmonary:     Effort: Pulmonary effort is normal.     Breath sounds: Normal breath sounds.  Neurological:     Mental Status: She is alert.     No results found for any visits on 09/09/21.      Assessment & Plan:  Take meds as prescribed - Use a cool mist humidifier  -Use saline nose sprays frequently -Force fluids -For fever or aches or pains- take Tylenol or ibuprofen. -Azithromycin 500 mg tablet by mouth day 1, 250 mg tablet day 2-5. -If symptoms do not improve, she may need to be COVID tested to rule this out Follow up with worsening unresolved symptoms  Problem List Items Addressed This Visit   None Visit Diagnoses     Non-recurrent acute serous otitis media of left ear    -  Primary   Relevant Medications   azithromycin (ZITHROMAX) 250 MG tablet   predniSONE (DELTASONE) 20 MG tablet  Meds ordered this encounter  Medications   azithromycin (ZITHROMAX) 250 MG tablet    Sig: Take 2 tablets on day 1, then 1 tablet daily on days 2 through 5    Dispense:  6 tablet    Refill:  0    Order Specific Question:   Supervising Provider    Answer:   Jeneen Rinks   predniSONE (DELTASONE) 20 MG tablet    Sig: Take 1 tablet (20 mg total) by mouth daily with breakfast.    Dispense:  6 tablet    Refill:  0    Order Specific Question:   Supervising Provider    Answer:   Jeneen Rinks    Return if symptoms worsen or fail to improve.  Ivy Lynn, NP

## 2021-09-09 NOTE — Patient Instructions (Signed)
Otitis Media, Adult  Otitis media is a condition in which the middle ear is red and swollen (inflamed) and full of fluid. The middle ear is the part of the ear that contains bones for hearing as well as air that helps send sounds to the brain. The condition usually goes away on its own. What are the causes? This condition is caused by a blockage in the eustachian tube. This tube connects the middle ear to the back of the nose. It normally allows air into the middle ear. The blockage is caused by fluid or swelling. Problems that can cause blockage include: A cold or infection that affects the nose, mouth, or throat. Allergies. An irritant, such as tobacco smoke. Adenoids that have become large. The adenoids are soft tissue located in the back of the throat, behind the nose and the roof of the mouth. Growth or swelling in the upper part of the throat, just behind the nose (nasopharynx). Damage to the ear caused by a change in pressure. This is called barotrauma. What increases the risk? You are more likely to develop this condition if you: Smoke or are exposed to tobacco smoke. Have an opening in the roof of your mouth (cleft palate). Have acid reflux. Have problems in your body's defense system (immune system). What are the signs or symptoms? Symptoms of this condition include: Ear pain. Fever. Problems with hearing. Being tired. Fluid leaking from the ear. Ringing in the ear. How is this treated? This condition can go away on its own within 3-5 days. But if the condition is caused by germs (bacteria) and does not go away on its own, or if it keeps coming back, your doctor may: Give you antibiotic medicines. Give you medicines for pain. Follow these instructions at home: Take over-the-counter and prescription medicines only as told by your doctor. If you were prescribed an antibiotic medicine, take it as told by your doctor. Do not stop taking it even if you start to feel better. Keep  all follow-up visits. Contact a doctor if: You have bleeding from your nose. There is a lump on your neck. You are not feeling better in 5 days. You feel worse instead of better. Get help right away if: You have pain that is not helped with medicine. You have swelling, redness, or pain around your ear. You get a stiff neck. You cannot move part of your face (paralysis). You notice that the bone behind your ear hurts when you touch it. You get a very bad headache. Summary Otitis media means that the middle ear is red, swollen, and full of fluid. This condition usually goes away on its own. If the problem does not go away, treatment may be needed. You may be given medicines to treat the infection or to treat your pain. If you were prescribed an antibiotic medicine, take it as told by your doctor. Do not stop taking it even if you start to feel better. Keep all follow-up visits. This information is not intended to replace advice given to you by your health care provider. Make sure you discuss any questions you have with your health care provider. Document Revised: 04/30/2020 Document Reviewed: 04/30/2020 Elsevier Patient Education  2023 [employer]  

## 2021-09-12 ENCOUNTER — Other Ambulatory Visit: Payer: Self-pay | Admitting: Physician Assistant

## 2021-09-12 ENCOUNTER — Other Ambulatory Visit: Payer: Self-pay | Admitting: Family Medicine

## 2021-09-12 DIAGNOSIS — G2581 Restless legs syndrome: Secondary | ICD-10-CM

## 2021-09-13 ENCOUNTER — Telehealth: Payer: Self-pay | Admitting: Internal Medicine

## 2021-09-13 NOTE — Telephone Encounter (Signed)
Patient is calling stating that she is receiving a barcode through through mychart asking her to download something to track her Afib. She is confused by this due to never being told she has Afib. Please advise.

## 2021-09-13 NOTE — Telephone Encounter (Signed)
Spoke with the patient and advised her on information from [place]. Patient verbalized understanding.

## 2021-09-19 LAB — CUP PACEART REMOTE DEVICE CHECK
Date Time Interrogation Session: 20230815120049
Implantable Pulse Generator Implant Date: 20230508

## 2021-09-20 NOTE — Progress Notes (Signed)
Carelink Summary Report / Loop Recorder 

## 2021-09-23 ENCOUNTER — Ambulatory Visit (INDEPENDENT_AMBULATORY_CARE_PROVIDER_SITE_OTHER): Payer: 59

## 2021-09-23 DIAGNOSIS — I639 Cerebral infarction, unspecified: Secondary | ICD-10-CM

## 2021-10-14 ENCOUNTER — Other Ambulatory Visit: Payer: Self-pay | Admitting: Family Medicine

## 2021-10-20 NOTE — Progress Notes (Signed)
Carelink Summary Report / Loop Recorder 

## 2021-10-21 LAB — CUP PACEART REMOTE DEVICE CHECK
Date Time Interrogation Session: 20230917120246
Implantable Pulse Generator Implant Date: 20230508

## 2021-10-28 ENCOUNTER — Ambulatory Visit (INDEPENDENT_AMBULATORY_CARE_PROVIDER_SITE_OTHER): Payer: 59

## 2021-10-28 DIAGNOSIS — I639 Cerebral infarction, unspecified: Secondary | ICD-10-CM | POA: Diagnosis not present

## 2021-11-06 ENCOUNTER — Telehealth: Payer: Self-pay | Admitting: Internal Medicine

## 2021-11-06 NOTE — Telephone Encounter (Signed)
Pt called [facility] c/o fatigue, intermittent dizziness.  BP this morning was 179/112, with HR of 98.  Pt called back and stated her BP has been up and down for a while.  This morning she felt fatigued, and sometimes feels dizzy.  She confirmed the BP and HR above was correct.  She is going to see her PCP tomorrow. She called her PCP office, and they recommended additional appointment to [facility].   At time of call, Pt stated her BP was 164/107, and HR was 74.    Per Pt request, pt scheduled to see Ambrose Pancoast, NP on 11/07/2021 at 155 pm.   Pt wanted to address her up and down BP, and symptoms with a provider in the [place] office.  I scheduled this appointment.  Pt advised  if symptoms worsen, nor BP or HR does not go down, to go to the nearest ER for a providers care.  Pt understood with symptomatic Hypertension, ER visit may be needed if it is uncontrolled throughout the night.

## 2021-11-06 NOTE — Telephone Encounter (Signed)
STAT if patient feels like he/she is going to faint   Are you dizzy now? Yes   Do you feel faint or have you passed out? no  Do you have any other symptoms? Pt states she feel like her heart isn't pumping enough blood and she feels weak   Have you checked your HR and BP (record if available)?  179/112 hr 98

## 2021-11-06 NOTE — Progress Notes (Deleted)
Office Visit    Patient Name: Hannah Mcdaniel Date of Encounter: 11/06/2021  Primary Care Provider:  Ivy Lynn, NP Primary Cardiologist:  Fransico Him, MD Primary Electrophysiologist: None  Chief Complaint    Hannah Mcdaniel is a 56 y.o. female with PMH of CVA recurrent with 3 episodes within 30 days, HLD, DM type II, GERD, HTN, migraines depression with anxiety, PAD s/p stenting, tobacco abuse who presents today for elevated blood pressures and heart rate.  Past Medical History    Past Medical History:  Diagnosis Date   Anxiety    Arterial occlusive disease Nov. 2014   Arthritis    Claudication of lower extremity ([place]) Nov. 2014   Right Lower Extremity rest pain   Colon polyps    Depression    Diabetes mellitus without complication ([place])    GERD (gastroesophageal reflux disease)    Hyperlipidemia    Migraines    Vertigo    Past Surgical History:  Procedure Laterality Date   ABDOMINAL AORTAGRAM N/A 01/10/2013   Procedure: ABDOMINAL AORTAGRAM;  Surgeon: Rosetta Posner, MD;  Location: [facility] CATH LAB;  Service: Cardiovascular;  Laterality: N/A;   ABDOMINAL AORTOGRAM W/LOWER EXTREMITY Bilateral 07/16/2018   Procedure: ABDOMINAL AORTOGRAM W/LOWER EXTREMITY;  Surgeon: Angelia Mould, MD;  Location: [place] CV LAB;  Service: Cardiovascular;  Laterality: Bilateral;   ABDOMINAL AORTOGRAM W/LOWER EXTREMITY N/A 10/05/2020   Procedure: ABDOMINAL AORTOGRAM W/LOWER EXTREMITY;  Surgeon: Angelia Mould, MD;  Location: [place] CV LAB;  Service: Cardiovascular;  Laterality: N/A;   BUBBLE STUDY  03/26/2021   Procedure: BUBBLE STUDY;  Surgeon: Donato Heinz, MD;  Location: [facility] ENDOSCOPY;  Service: Cardiovascular;;   CHOLECYSTECTOMY     Gall Bladder   iliac artery angioplasty and stent placement  01/10/13   LOWER EXTREMITY ANGIOGRAM Bilateral 01/10/2013   Procedure: LOWER EXTREMITY ANGIOGRAM;  Surgeon: Rosetta Posner, MD;  Location: [facility] CATH LAB;  Service:  Cardiovascular;  Laterality: Bilateral;   PERCUTANEOUS STENT INTERVENTION Right 01/10/2013   Procedure: PERCUTANEOUS STENT INTERVENTION;  Surgeon: Rosetta Posner, MD;  Location: [facility] CATH LAB;  Service: Cardiovascular;  Laterality: Right;  rt common iliac stent   PERIPHERAL VASCULAR CATHETERIZATION N/A 11/26/2015   Procedure: Abdominal Aortogram w/Lower Extremity;  Surgeon: Angelia Mould, MD;  Location: [place] CV LAB;  Service: Cardiovascular;  Laterality: N/A;   PERIPHERAL VASCULAR CATHETERIZATION Right 11/26/2015   Procedure: Peripheral Vascular Balloon Angioplasty;  Surgeon: Angelia Mould, MD;  Location: [place] CV LAB;  Service: Cardiovascular;  Laterality: Right;  rt common iliac   PERIPHERAL VASCULAR INTERVENTION  07/16/2018   Procedure: PERIPHERAL VASCULAR INTERVENTION;  Surgeon: Angelia Mould, MD;  Location: [place] CV LAB;  Service: Cardiovascular;;   PERIPHERAL VASCULAR INTERVENTION Right 10/05/2020   Procedure: PERIPHERAL VASCULAR INTERVENTION;  Surgeon: Angelia Mould, MD;  Location: [place] CV LAB;  Service: Cardiovascular;  Laterality: Right;  Common and exteral iliac artery   TEE WITHOUT CARDIOVERSION N/A 03/26/2021   Procedure: TRANSESOPHAGEAL ECHOCARDIOGRAM (TEE);  Surgeon: Donato Heinz, MD;  Location: [facility] ENDOSCOPY;  Service: Cardiovascular;  Laterality: N/A;   TOTAL ABDOMINAL HYSTERECTOMY      Allergies  Allergies  Allergen Reactions   Asa [Aspirin] Rash    Can take low-dose aspirin.   Bempedoic Acid Other (See Comments)    Bilateral shoulder pain.   Codeine Rash   Penicillins Rash    Has patient had a PCN reaction causing immediate rash, facial/tongue/throat  swelling, SOB or lightheadedness with hypotension:Yes Has patient had a PCN reaction causing severe rash involving mucus membranes or skin necrosis:Yes Has patient had a PCN reaction that required hospitalization:No Has patient had a PCN reaction occurring within  the last 10 years:No If all of the above answers are "NO", then may proceed with Cephalosporin use.     History of Present Illness    Hannah Mcdaniel  is a 56 year old female with the above mention past medical history who presents today for complaint of up-and-down blood pressures with elevated heart rate.  Patient has significant history of CVA with recent bilateral basal ganglia ischemic stroke s/p TNK (03/21/21) per MRI and CT scan.  CTA of head and neck with no large vessel stenosis.  2D echo was completed with EF of 55% and mildly dilated LA with negative bubble study.  Patient was discharged with antiplatelet therapy consisting of ASA and Brilinta.  She was noted to have some left-sided weakness.  Patient had cryptogenic strokes and had 30-day event monitor worn and ordered by neurology.  Patient had 30-day event monitor showed predominant rhythm of normal sinus with wide-complex tachycardia and 5 beats of nonsustained atrial tach with sinus arrhythmia.  Patient was seen by EP for consultation of ILR for cryptogenic nature of strokes.  Recorder was placed successfully on 06/2021.  She was last seen in follow-up on  Since last being seen in the office patient reports***.  Patient denies chest pain, palpitations, dyspnea, PND, orthopnea, nausea, vomiting, dizziness, syncope, edema, weight gain, or early satiety.   ***Notes: -Patient reports dizziness and up-and-down blood pressures with last being 179/112 and heart rate of 98 Home Medications    Current Outpatient Medications  Medication Sig Dispense Refill   aspirin EC 81 MG tablet Take 81 mg by mouth daily.     clopidogrel (PLAVIX) 75 MG tablet TAKE 1 TABLET BY MOUTH EVERY DAY 90 tablet 3   Continuous Blood Gluc Receiver (FREESTYLE LIBRE 2 READER) DEVI 1 Device by Does not apply route continuous. 1 each 0   Continuous Blood Gluc Sensor (FREESTYLE LIBRE 2 SENSOR) MISC 1 Device by Does not apply route every 14 (fourteen) days. 2 each 5    ezetimibe (ZETIA) 10 MG tablet Take 1 tablet (10 mg total) by mouth daily. 90 tablet 1   FLUoxetine (PROZAC) 40 MG capsule Take 1 capsule (40 mg total) by mouth daily. 90 capsule 1   levocetirizine (XYZAL) 5 MG tablet Take 1 tablet (5 mg total) by mouth every evening. 90 tablet 1   losartan (COZAAR) 50 MG tablet Take 1 tablet (50 mg total) by mouth daily. 90 tablet 1   metFORMIN (GLUCOPHAGE) 500 MG tablet 2 tablets daily with breakfast and 1 tablet with supper 270 tablet 1   mirtazapine (REMERON) 7.5 MG tablet Take 1 tablet (7.5 mg total) by mouth at bedtime. 90 tablet 1   montelukast (SINGULAIR) 10 MG tablet Take 1 tablet (10 mg total) by mouth at bedtime. 90 tablet 1   PRALUENT 75 MG/ML SOAJ INJECT 75 MG SUBCUTANEOUSLY EVERY 14 DAYS 1 mL 2   predniSONE (DELTASONE) 20 MG tablet Take 1 tablet (20 mg total) by mouth daily with breakfast. 6 tablet 0   rOPINIRole (REQUIP) 0.25 MG tablet TAKE 1-2 TABLETS (0.25-0.5 MG TOTAL) BY MOUTH AT BEDTIME. 180 tablet 1   rosuvastatin (CRESTOR) 40 MG tablet Take 1 tablet (40 mg total) by mouth daily. 90 tablet 1   senna-docusate (SENOKOT-S) 8.6-50 MG tablet  Take 1 tablet by mouth at bedtime as needed for mild constipation.     No current facility-administered medications for this visit.     Review of Systems  Please see the history of present illness.    (+)*** (+)***  All other systems reviewed and are otherwise negative except as noted above.  Physical Exam    Wt Readings from Last 3 Encounters:  09/09/21 151 lb 6.4 oz (68.7 kg)  08/01/21 174 lb (78.9 kg)  06/10/21 145 lb 0.3 oz (65.8 kg)   EU:MPNTI were no vitals filed for this visit.,There is no height or weight on file to calculate BMI.  Constitutional:      Appearance: Healthy appearance. Not in distress.  Neck:     Vascular: JVD normal.  Pulmonary:     Effort: Pulmonary effort is normal.     Breath sounds: No wheezing. No rales. Diminished in the bases Cardiovascular:     Normal rate.  Regular rhythm. Normal S1. Normal S2.      Murmurs: There is no murmur.  Edema:    Peripheral edema absent.  Abdominal:     Palpations: Abdomen is soft non tender. There is no hepatomegaly.  Skin:    General: Skin is warm and dry.  Neurological:     General: No focal deficit present.     Mental Status: Alert and oriented to person, place and time.     Cranial Nerves: Cranial nerves are intact.  EKG/LABS/Other Studies Reviewed    ECG personally reviewed by me today - ***  Risk Assessment/Calculations:   {Does this patient have ATRIAL FIBRILLATION?:[phone]}        Lab Results  Component Value Date   WBC 9.7 08/01/2021   HGB 13.8 08/01/2021   HCT 41.1 08/01/2021   MCV 91 08/01/2021   PLT 276 08/01/2021   Lab Results  Component Value Date   CREATININE 0.99 08/01/2021   BUN 14 08/01/2021   NA 140 08/01/2021   K 4.3 08/01/2021   CL 105 08/01/2021   CO2 22 08/01/2021   Lab Results  Component Value Date   ALT 12 08/01/2021   AST 16 08/01/2021   ALKPHOS 145 (H) 08/01/2021   BILITOT 0.3 08/01/2021   Lab Results  Component Value Date   CHOL 128 08/01/2021   HDL 48 08/01/2021   LDLCALC 55 08/01/2021   TRIG 148 08/01/2021   CHOLHDL 2.7 08/01/2021    Lab Results  Component Value Date   HGBA1C 6.2 (H) 08/01/2021    Assessment & Plan    1.  History of CVA: -Cryptogenic strokes with implantable loop recorder placed with no episodes of AF per [facility] report 10/2021  -Continue current GDMT with Plavix 75 mg and ASA 81 mg, Crestor 40 mg, and Zetia 10 mg  2.  Essential hypertension: -Patient has reported up-and-down blood pressures with pressure today of*** -Only on losartan 50 mg  3.  DM type II: -Patient's last hemoglobin A1c was 6.2 -Continue current treatment plan per PCP  4.  Hyperlipidemia: -Patient's last LDL cholesterol was 55 below goal of less than 70 -Continue Crestor 40 mg daily and Zetia 10 mg      Disposition: Follow-up with Fransico Him, MD  or APP in *** months {Are you ordering a CV Procedure (e.g. stress test, cath, DCCV, TEE, etc)?   Press F2        :[id number]}   Medication Adjustments/Labs and Tests Ordered: Current medicines are reviewed at length with the patient  today.  Concerns regarding medicines are outlined above.   Signed, Mable Fill, Marissa Nestle, NP 11/06/2021, 4:30 PM [facility]  Note:  This document was prepared using Dragon voice recognition software and may include unintentional dictation errors.

## 2021-11-07 ENCOUNTER — Ambulatory Visit: Payer: 59 | Admitting: Nurse Practitioner

## 2021-11-07 DIAGNOSIS — I10 Essential (primary) hypertension: Secondary | ICD-10-CM

## 2021-11-08 NOTE — Progress Notes (Signed)
Carelink Summary Report / Loop Recorder 

## 2021-12-02 ENCOUNTER — Ambulatory Visit (INDEPENDENT_AMBULATORY_CARE_PROVIDER_SITE_OTHER): Payer: 59

## 2021-12-02 DIAGNOSIS — I639 Cerebral infarction, unspecified: Secondary | ICD-10-CM

## 2021-12-02 LAB — CUP PACEART REMOTE DEVICE CHECK
Date Time Interrogation Session: 20231029231440
Implantable Pulse Generator Implant Date: 20230508

## 2022-01-01 NOTE — Progress Notes (Signed)
Carelink Summary Report / Loop Recorder 

## 2022-01-05 ENCOUNTER — Other Ambulatory Visit: Payer: Self-pay | Admitting: Family Medicine

## 2022-01-06 ENCOUNTER — Ambulatory Visit (INDEPENDENT_AMBULATORY_CARE_PROVIDER_SITE_OTHER): Payer: 59

## 2022-01-06 DIAGNOSIS — I639 Cerebral infarction, unspecified: Secondary | ICD-10-CM

## 2022-01-06 LAB — CUP PACEART REMOTE DEVICE CHECK
Date Time Interrogation Session: 20231203232115
Implantable Pulse Generator Implant Date: 20230508

## 2022-02-04 ENCOUNTER — Encounter: Payer: 59 | Admitting: Family Medicine

## 2022-02-04 ENCOUNTER — Encounter: Payer: Self-pay | Admitting: Nurse Practitioner

## 2022-02-04 ENCOUNTER — Ambulatory Visit (INDEPENDENT_AMBULATORY_CARE_PROVIDER_SITE_OTHER): Payer: 59 | Admitting: Nurse Practitioner

## 2022-02-04 VITALS — BP 180/96 | HR 75 | Temp 98.7°F | Ht 61.0 in | Wt 147.0 lb

## 2022-02-04 DIAGNOSIS — E782 Mixed hyperlipidemia: Secondary | ICD-10-CM

## 2022-02-04 DIAGNOSIS — Z7984 Long term (current) use of oral hypoglycemic drugs: Secondary | ICD-10-CM | POA: Diagnosis not present

## 2022-02-04 DIAGNOSIS — E1169 Type 2 diabetes mellitus with other specified complication: Secondary | ICD-10-CM

## 2022-02-04 DIAGNOSIS — I10 Essential (primary) hypertension: Secondary | ICD-10-CM | POA: Diagnosis not present

## 2022-02-04 DIAGNOSIS — E785 Hyperlipidemia, unspecified: Secondary | ICD-10-CM | POA: Diagnosis not present

## 2022-02-04 LAB — BAYER DCA HB A1C WAIVED: HB A1C (BAYER DCA - WAIVED): 7.1 % — ABNORMAL HIGH (ref 4.8–5.6)

## 2022-02-04 NOTE — Assessment & Plan Note (Signed)
Blood pressure elevated in clinic today, patient reports she has not had her blood pressure medication today, but blood pressure is well controlled on current medication regimen  Completed labs: CBC, CMP, Lipid panel results pending. Continue low sodium diet,

## 2022-02-04 NOTE — Assessment & Plan Note (Signed)
Labs completed results pending. Continue low cholesterol diet. And follow up in 6 months

## 2022-02-04 NOTE — Patient Instructions (Signed)
Dyslipidemia Dyslipidemia is an imbalance of waxy, fat-like substances (lipids) in the blood. The body needs lipids in small amounts. Dyslipidemia often involves a high level of cholesterol or triglycerides, which are types of lipids. Common forms of dyslipidemia include: High levels of LDL cholesterol. LDL is the type of cholesterol that causes fatty deposits (plaques) to build up in the blood vessels that carry blood away from the heart (arteries). Low levels of HDL cholesterol. HDL cholesterol is the type of cholesterol that protects against heart disease. High levels of HDL remove the LDL buildup from arteries. High levels of triglycerides. Triglycerides are a fatty substance in the blood that is linked to a buildup of plaques in the arteries. What are the causes? There are two main types of dyslipidemia: primary and secondary. Primary dyslipidemia is caused by changes (mutations) in genes that are passed down through families (inherited). These mutations cause several types of dyslipidemia. Secondary dyslipidemia may be caused by various risk factors that can lead to the disease, such as lifestyle choices and certain medical conditions. What increases the risk? You are more likely to develop this condition if you are an older man or if you are a woman who has gone through menopause. Other risk factors include: Having a family history of dyslipidemia. Taking certain medicines, including birth control pills, steroids, some diuretics, and beta-blockers. Eating a diet high in saturated fat. Smoking cigarettes or excessive alcohol intake. Having certain medical conditions such as diabetes, polycystic ovary syndrome (PCOS), kidney disease, liver disease, or hypothyroidism. Not exercising regularly. Being overweight or obese with too much belly fat. What are the signs or symptoms? In most cases, dyslipidemia does not usually cause any symptoms. In severe cases, very high lipid levels can  cause: Fatty bumps under the skin (xanthomas). A white or gray ring around the black center (pupil) of the eye. Very high triglyceride levels can cause inflammation of the pancreas (pancreatitis). How is this diagnosed? Your health care provider may diagnose dyslipidemia based on a routine blood test (fasting blood test). Because most people do not have symptoms of the condition, this blood testing (lipid profile) is done on adults age 20 and older and is repeated every 4-6 years. This test checks: Total cholesterol. This measures the total amount of cholesterol in your blood, including LDL cholesterol, HDL cholesterol, and triglycerides. A healthy number is below 200 mg/dL (5.17 mmol/L). LDL cholesterol. The target number for LDL cholesterol is different for each person, depending on individual risk factors. A healthy number is usually below 100 mg/dL (2.59 mmol/L). Ask your health care provider what your LDL cholesterol should be. HDL cholesterol. An HDL level of 60 mg/dL (1.55 mmol/L) or higher is best because it helps to protect against heart disease. A number below 40 mg/dL (1.03 mmol/L) for men or below 50 mg/dL (1.29 mmol/L) for women increases the risk for heart disease. Triglycerides. A healthy triglyceride number is below 150 mg/dL (1.69 mmol/L). If your lipid profile is abnormal, your health care provider may do other blood tests. How is this treated? Treatment depends on the type of dyslipidemia that you have and your other risk factors for heart disease and stroke. Your health care provider will have a target range for your lipid levels based on this information. Treatment for dyslipidemia starts with lifestyle changes, such as diet and exercise. Your health care provider may recommend that you: Get regular exercise. Make changes to your diet. Quit smoking if you smoke. Limit your alcohol intake. If diet   changes and exercise do not help you reach your goals, your health care provider  may also prescribe medicine to lower lipids. The most commonly prescribed type of medicine lowers your LDL cholesterol (statin drug). If you have a high triglyceride level, your provider may prescribe another type of drug (fibrate) or an omega-3 fish oil supplement, or both. Follow these instructions at home: Eating and drinking  Follow instructions from your health care provider or dietitian about eating or drinking restrictions. Eat a healthy diet as told by your health care provider. This can help you reach and maintain a healthy weight, lower your LDL cholesterol, and raise your HDL cholesterol. This may include: Limiting your calories, if you are overweight. Eating more fruits, vegetables, whole grains, fish, and lean meats. Limiting saturated fat, trans fat, and cholesterol. Do not drink alcohol if: Your health care provider tells you not to drink. You are pregnant, may be pregnant, or are planning to become pregnant. If you drink alcohol: Limit how much you have to: 0-1 drink a day for women. 0-2 drinks a day for men. Know how much alcohol is in your drink. In the [place] one drink equals one 12 oz bottle of beer (355 mL), one 5 oz glass of wine (148 mL), or one 1 oz glass of hard liquor (44 mL). Activity Get regular exercise. Start an exercise and strength training program as told by your health care provider. Ask your health care provider what activities are safe for you. Your health care provider may recommend: 30 minutes of aerobic activity 4-6 days a week. Brisk walking is an example of aerobic activity. Strength training 2 days a week. General instructions Do not use any products that contain nicotine or tobacco. These products include cigarettes, chewing tobacco, and vaping devices, such as e-cigarettes. If you need help quitting, ask your health care provider. Take over-the-counter and prescription medicines only as told by your health care provider. This includes  supplements. Keep all follow-up visits. This is important. Contact a health care provider if: You are having trouble sticking to your exercise or diet plan. You are struggling to quit smoking or to control your use of alcohol. Summary Dyslipidemia often involves a high level of cholesterol or triglycerides, which are types of lipids. Treatment depends on the type of dyslipidemia that you have and your other risk factors for heart disease and stroke. Treatment for dyslipidemia starts with lifestyle changes, such as diet and exercise. Your health care provider may prescribe medicine to lower lipids. This information is not intended to replace advice given to you by your health care provider. Make sure you discuss any questions you have with your health care provider. Document Revised: 08/23/2021 Document Reviewed: 03/26/2020 Elsevier Patient Education  [place]. Diabetes Mellitus Basics  Diabetes mellitus, or diabetes, is a long-term (chronic) disease. It occurs when the body does not properly use sugar (glucose) that is released from food after you eat. Diabetes mellitus may be caused by one or both of these problems: Your pancreas does not make enough of a hormone called insulin. Your body does not react in a normal way to the insulin that it makes. Insulin lets glucose enter cells in your body. This gives you energy. If you have diabetes, glucose cannot get into cells. This causes high blood glucose (hyperglycemia). How to treat and manage diabetes You may need to take insulin or other diabetes medicines daily to keep your glucose in balance. If you are prescribed insulin, you will  learn how to give yourself insulin by injection. You may need to adjust the amount of insulin you take based on the foods that you eat. You will need to check your blood glucose levels using a glucose monitor as told by your health care provider. The readings can help determine if you have low or high  blood glucose. Generally, you should have these blood glucose levels: Before meals (preprandial): 80-130 mg/dL (4.4-7.2 mmol/L). After meals (postprandial): below 180 mg/dL (10 mmol/L). Hemoglobin A1c (HbA1c) level: less than 7%. Your health care provider will set treatment goals for you. Keep all follow-up visits. This is important. Follow these instructions at home: Diabetes medicines Take your diabetes medicines every day as told by your health care provider. List your diabetes medicines here: Name of medicine: ______________________________ Amount (dose): _______________ Time (a.m./p.m.): _______________ Notes: ___________________________________ Name of medicine: ______________________________ Amount (dose): _______________ Time (a.m./p.m.): _______________ Notes: ___________________________________ Name of medicine: ______________________________ Amount (dose): _______________ Time (a.m./p.m.): _______________ Notes: ___________________________________ Insulin If you use insulin, list the types of insulin you use here: Insulin type: ______________________________ Amount (dose): _______________ Time (a.m./p.m.): _______________Notes: ___________________________________ Insulin type: ______________________________ Amount (dose): _______________ Time (a.m./p.m.): _______________ Notes: ___________________________________ Insulin type: ______________________________ Amount (dose): _______________ Time (a.m./p.m.): _______________ Notes: ___________________________________ Insulin type: ______________________________ Amount (dose): _______________ Time (a.m./p.m.): _______________ Notes: ___________________________________ Insulin type: ______________________________ Amount (dose): _______________ Time (a.m./p.m.): _______________ Notes: ___________________________________ Managing blood glucose  Check your blood glucose levels using a glucose monitor as told by your health care  provider. Write down the times that you check your glucose levels here: Time: _______________ Notes: ___________________________________ Time: _______________ Notes: ___________________________________ Time: _______________ Notes: ___________________________________ Time: _______________ Notes: ___________________________________ Time: _______________ Notes: ___________________________________ Time: _______________ Notes: ___________________________________  Low blood glucose Low blood glucose (hypoglycemia) is when glucose is at or below 70 mg/dL (3.9 mmol/L). Symptoms may include: Feeling: Hungry. Sweaty and clammy. Irritable or easily upset. Dizzy. Sleepy. Having: A fast heartbeat. A headache. A change in your vision. Numbness around the mouth, lips, or tongue. Having trouble with: Moving (coordination). Sleeping. Treating low blood glucose To treat low blood glucose, eat or drink something containing sugar right away. If you can think clearly and swallow safely, follow the 15:15 rule: Take 15 grams of a fast-acting carb (carbohydrate), as told by your health care provider. Some fast-acting carbs are: Glucose tablets: take 3-4 tablets. Hard candy: eat 3-5 pieces. Fruit juice: drink 4 oz (120 mL). Regular (not diet) soda: drink 4-6 oz (120-180 mL). Honey or sugar: eat 1 Tbsp (15 mL). Check your blood glucose levels 15 minutes after you take the carb. If your glucose is still at or below 70 mg/dL (3.9 mmol/L), take 15 grams of a carb again. If your glucose does not go above 70 mg/dL (3.9 mmol/L) after 3 tries, get help right away. After your glucose goes back to normal, eat a meal or a snack within 1 hour. Treating very low blood glucose If your glucose is at or below 54 mg/dL (3 mmol/L), you have very low blood glucose (severe hypoglycemia). This is an emergency. Do not wait to see if the symptoms will go away. Get medical help right away. Call your local emergency services  (911 in the [place] Do not drive yourself to the hospital. Questions to ask your health care provider Should I talk with a diabetes educator? What equipment will I need to care for myself at home? What diabetes medicines do I need? When should I take them? How often do I need to check my  blood glucose levels? What number can I call if I have questions? When is my follow-up visit? Where can I find a support group for people with diabetes? Where to find more information American Diabetes Association: www.diabetes.org Association of Diabetes Care and Education Specialists: www.diabeteseducator.org Contact a health care provider if: Your blood glucose is at or above 240 mg/dL (13.3 mmol/L) for 2 days in a row. You have been sick or have had a fever for 2 days or more, and you are not getting better. You have any of these problems for more than 6 hours: You cannot eat or drink. You feel nauseous. You vomit. You have diarrhea. Get help right away if: Your blood glucose is lower than 54 mg/dL (3 mmol/L). You get confused. You have trouble thinking clearly. You have trouble breathing. These symptoms may represent a serious problem that is an emergency. Do not wait to see if the symptoms will go away. Get medical help right away. Call your local emergency services (911 in the [place] Do not drive yourself to the hospital. Summary Diabetes mellitus is a chronic disease that occurs when the body does not properly use sugar (glucose) that is released from food after you eat. Take insulin and diabetes medicines as told. Check your blood glucose every day, as often as told. Keep all follow-up visits. This is important. This information is not intended to replace advice given to you by your health care provider. Make sure you discuss any questions you have with your health care provider. Document Revised: 05/24/2019 Document Reviewed: 05/24/2019 Elsevier Patient Education  [place].

## 2022-02-04 NOTE — Assessment & Plan Note (Signed)
Continue on current medication regimen. No worsening or new symptoms. Continue diabetic diet. Labs completed results pending:  Patient did not bring a blood pressure log to clinic today she reports that her blood sugar is well controlled at home.

## 2022-02-04 NOTE — Progress Notes (Signed)
Acute Office Visit  Subjective:     Patient ID: Hannah Mcdaniel, female    DOB: 08-02-1965, 57 y.o.   MRN: [id number]  Chief Complaint  Patient presents with   Medical Management of Chronic Issues    6 month    Diabetes She presents for her follow-up diabetic visit. She has type 2 diabetes mellitus. The initial diagnosis of diabetes was made 3 years ago. There are no hypoglycemic associated symptoms. Pertinent negatives for hypoglycemia include no confusion, headaches or mood changes. Pertinent negatives for diabetes include no blurred vision, no chest pain, no fatigue, no foot ulcerations, no polydipsia and no polyphagia. There are no hypoglycemic complications. Symptoms are stable. Risk factors for coronary artery disease include dyslipidemia, diabetes mellitus and hypertension. Current diabetic treatment includes oral agent (monotherapy). She is compliant with treatment most of the time. She is following a generally healthy diet. She has not had a previous visit with a dietitian. Her home blood glucose trend is fluctuating dramatically.  Hypertension This is a chronic problem. The current episode started more than 1 year ago. The problem is unchanged. The problem is controlled. Pertinent negatives include no blurred vision, chest pain or headaches. Past treatments include calcium channel blockers. The current treatment provides significant improvement. Compliance problems include medication side effects.   Hyperlipidemia This is a chronic problem. The current episode started more than 1 year ago. The problem is controlled. Recent lipid tests were reviewed and are variable. She has no history of hypothyroidism. Pertinent negatives include no chest pain. Current antihyperlipidemic treatment includes ezetimibe and statins. The current treatment provides moderate improvement of lipids. There are no compliance problems.  Risk factors for coronary artery disease include dyslipidemia and diabetes  mellitus.     Review of Systems  Constitutional: Negative.  Negative for chills, fatigue and fever.  HENT: Negative.    Eyes:  Negative for blurred vision.  Respiratory: Negative.    Cardiovascular: Negative.  Negative for chest pain.  Gastrointestinal: Negative.   Genitourinary: Negative.   Musculoskeletal: Negative.   Skin: Negative.  Negative for itching and rash.  Neurological:  Negative for headaches.  Endo/Heme/Allergies:  Negative for polydipsia and polyphagia.  Psychiatric/Behavioral:  Negative for confusion.   All other systems reviewed and are negative.       Objective:    BP (!) 180/96   Pulse 75   Temp 98.7 F (37.1 C)   Ht _0  (1.549 m)   Wt 147 lb (66.7 kg)   SpO2 98%   BMI 27.78 kg/m  BP Readings from Last 3 Encounters:  02/04/22 (!) 180/96  09/09/21 (!) 158/84  08/01/21 125/73   Wt Readings from Last 3 Encounters:  02/04/22 147 lb (66.7 kg)  09/09/21 151 lb 6.4 oz (68.7 kg)  08/01/21 174 lb (78.9 kg)      Physical Exam Vitals and nursing note reviewed.  Constitutional:      Appearance: Normal appearance.  HENT:     Head: Normocephalic.     Right Ear: External ear normal.     Left Ear: External ear normal.     Nose: Nose normal. No congestion.     Mouth/Throat:     Mouth: Mucous membranes are moist.     Pharynx: Oropharynx is clear.  Eyes:     Conjunctiva/sclera: Conjunctivae normal.     Pupils: Pupils are equal, round, and reactive to light.  Cardiovascular:     Rate and Rhythm: Normal rate and regular rhythm.  Pulses: Normal pulses.     Heart sounds: Normal heart sounds.  Pulmonary:     Effort: Pulmonary effort is normal.     Breath sounds: Normal breath sounds.  Abdominal:     General: Bowel sounds are normal.  Skin:    General: Skin is warm.     Findings: No erythema or rash.  Neurological:     General: No focal deficit present.     Mental Status: She is alert and oriented to person, place, and time.  Psychiatric:         Mood and Affect: Mood normal.        Behavior: Behavior normal.     Results for orders placed or performed in visit on 02/04/22  Bayer DCA Hb A1c Waived  Result Value Ref Range   HB A1C (BAYER DCA - WAIVED) 7.1 (H) 4.8 - 5.6 %        Assessment & Plan:   Problem List Items Addressed This Visit       Cardiovascular and Mediastinum   Essential hypertension    Blood pressure elevated in clinic today, patient reports she has not had her blood pressure medication today, but blood pressure is well controlled on current medication regimen  Completed labs: CBC, CMP, Lipid panel results pending. Continue low sodium diet,         Endocrine   DM type 2 with diabetic dyslipidemia ([place]) - Primary    Continue on current medication regimen. No worsening or new symptoms. Continue diabetic diet. Labs completed results pending:  Patient did not bring a blood pressure log to clinic today she reports that her blood sugar is well controlled at home.      Relevant Orders   Bayer DCA Hb A1c Waived (Completed)   CBC with Differential/Platelet   CMP14+EGFR   Lipid panel   VITAMIN D 25 Hydroxy (Vit-D Deficiency, Fractures)   Microalbumin / creatinine urine ratio   Vitamin B12     Other   Hyperlipidemia    Labs completed results pending. Continue low cholesterol diet. And follow up in 6 months       No orders of the defined types were placed in this encounter.   Return in about 3 months (around 05/06/2022) for chronic disease management.  Ivy Lynn, NP

## 2022-02-05 LAB — CMP14+EGFR
ALT: 12 IU/L (ref 0–32)
AST: 16 IU/L (ref 0–40)
Albumin/Globulin Ratio: 1.7 (ref 1.2–2.2)
Albumin: 4.1 g/dL (ref 3.8–4.9)
Alkaline Phosphatase: 148 IU/L — ABNORMAL HIGH (ref 44–121)
BUN/Creatinine Ratio: 10 (ref 9–23)
BUN: 10 mg/dL (ref 6–24)
Bilirubin Total: 0.3 mg/dL (ref 0.0–1.2)
CO2: 23 mmol/L (ref 20–29)
Calcium: 9.4 mg/dL (ref 8.7–10.2)
Chloride: 102 mmol/L (ref 96–106)
Creatinine, Ser: 1.03 mg/dL — ABNORMAL HIGH (ref 0.57–1.00)
Globulin, Total: 2.4 g/dL (ref 1.5–4.5)
Glucose: 122 mg/dL — ABNORMAL HIGH (ref 70–99)
Potassium: 4.1 mmol/L (ref 3.5–5.2)
Sodium: 139 mmol/L (ref 134–144)
Total Protein: 6.5 g/dL (ref 6.0–8.5)
eGFR: 63 mL/min/{1.73_m2} (ref >59)

## 2022-02-05 LAB — CBC WITH DIFFERENTIAL/PLATELET
Basophils Absolute: 0.1 10*3/uL (ref 0.0–0.2)
Basos: 1 %
EOS (ABSOLUTE): 0.1 10*3/uL (ref 0.0–0.4)
Eos: 1 %
Hematocrit: 42.7 % (ref 34.0–46.6)
Hemoglobin: 14.8 g/dL (ref 11.1–15.9)
Immature Grans (Abs): 0 10*3/uL (ref 0.0–0.1)
Immature Granulocytes: 0 %
Lymphocytes Absolute: 2.7 10*3/uL (ref 0.7–3.1)
Lymphs: 32 %
MCH: 30.5 pg (ref 26.6–33.0)
MCHC: 34.7 g/dL (ref 31.5–35.7)
MCV: 88 fL (ref 79–97)
Monocytes Absolute: 0.4 10*3/uL (ref 0.1–0.9)
Monocytes: 4 %
Neutrophils Absolute: 5.2 10*3/uL (ref 1.4–7.0)
Neutrophils: 62 %
Platelets: 293 10*3/uL (ref 150–450)
RBC: 4.86 x10E6/uL (ref 3.77–5.28)
RDW: 11.8 % (ref 11.7–15.4)
WBC: 8.4 10*3/uL (ref 3.4–10.8)

## 2022-02-05 LAB — MICROALBUMIN / CREATININE URINE RATIO
Creatinine, Urine: 100.3 mg/dL
Microalb/Creat Ratio: 38 mg/g creat — ABNORMAL HIGH (ref 0–29)
Microalbumin, Urine: 38 ug/mL

## 2022-02-05 LAB — LIPID PANEL
Chol/HDL Ratio: 4.5 ratio — ABNORMAL HIGH (ref 0.0–4.4)
Cholesterol, Total: 203 mg/dL — ABNORMAL HIGH (ref 100–199)
HDL: 45 mg/dL (ref >39)
LDL Chol Calc (NIH): 118 mg/dL — ABNORMAL HIGH (ref 0–99)
Triglycerides: 229 mg/dL — ABNORMAL HIGH (ref 0–149)
VLDL Cholesterol Cal: 40 mg/dL (ref 5–40)

## 2022-02-05 LAB — VITAMIN D 25 HYDROXY (VIT D DEFICIENCY, FRACTURES): Vit D, 25-Hydroxy: 7.8 ng/mL — ABNORMAL LOW (ref 30.0–100.0)

## 2022-02-06 LAB — SPECIMEN STATUS REPORT

## 2022-02-06 LAB — VITAMIN B12: Vitamin B-12: 284 pg/mL (ref 232–1245)

## 2022-02-09 ENCOUNTER — Other Ambulatory Visit: Payer: Self-pay | Admitting: Family Medicine

## 2022-02-09 DIAGNOSIS — I10 Essential (primary) hypertension: Secondary | ICD-10-CM

## 2022-02-09 DIAGNOSIS — E1169 Type 2 diabetes mellitus with other specified complication: Secondary | ICD-10-CM

## 2022-02-09 DIAGNOSIS — R630 Anorexia: Secondary | ICD-10-CM

## 2022-02-09 DIAGNOSIS — G479 Sleep disorder, unspecified: Secondary | ICD-10-CM

## 2022-02-10 ENCOUNTER — Ambulatory Visit (INDEPENDENT_AMBULATORY_CARE_PROVIDER_SITE_OTHER): Payer: 59

## 2022-02-10 DIAGNOSIS — I639 Cerebral infarction, unspecified: Secondary | ICD-10-CM | POA: Diagnosis not present

## 2022-02-11 LAB — CUP PACEART REMOTE DEVICE CHECK
Date Time Interrogation Session: 20240107232727
Implantable Pulse Generator Implant Date: 20230508

## 2022-02-13 NOTE — Progress Notes (Signed)
Carelink Summary Report / Loop Recorder 

## 2022-02-20 ENCOUNTER — Encounter: Payer: Self-pay | Admitting: Family Medicine

## 2022-02-20 ENCOUNTER — Ambulatory Visit (INDEPENDENT_AMBULATORY_CARE_PROVIDER_SITE_OTHER): Payer: 59 | Admitting: Family Medicine

## 2022-02-20 VITALS — BP 195/84 | HR 78 | Temp 97.8°F | Ht 61.0 in | Wt 157.0 lb

## 2022-02-20 DIAGNOSIS — I152 Hypertension secondary to endocrine disorders: Secondary | ICD-10-CM | POA: Diagnosis not present

## 2022-02-20 DIAGNOSIS — E1159 Type 2 diabetes mellitus with other circulatory complications: Secondary | ICD-10-CM

## 2022-02-20 DIAGNOSIS — I10 Essential (primary) hypertension: Secondary | ICD-10-CM

## 2022-02-20 MED ORDER — LOSARTAN POTASSIUM 50 MG PO TABS: 100.0000 mg | ORAL_TABLET | Freq: Every day | ORAL | 0 refills | Status: DC

## 2022-02-20 NOTE — Progress Notes (Signed)
Subjective:  Patient ID: Hannah Mcdaniel, female    DOB: 1965-07-09, 57 y.o.   MRN: [id number]  Patient Care Team: Baruch Gouty, FNP as PCP - General (Family Medicine) Sueanne Margarita, MD as PCP - Cardiology (Cardiology) Angelia Mould, MD as Consulting Physician (Vascular Surgery) Harlen Labs, MD as Referring Physician (Optometry)   Chief Complaint:  Hypertension   HPI: Hannah Mcdaniel is a 57 y.o. female presenting on 02/20/2022 for Hypertension   Pt walked into today with her family member for an appointment and asked to have her BP checked because she was not feeling the greatest. BP noted to be high so pt was placed on schedule to be seen. She states over the last few days she has had a slight headache with intermittent dizziness. States the dizziness only lasted a few seconds and has not happened in a day or so No other reported symptoms. She is currently on losartan 50 mg and tolerating well.      Relevant past medical, surgical, family, and social history reviewed and updated as indicated.  Allergies and medications reviewed and updated. Data reviewed: Chart in Epic.   Past Medical History:  Diagnosis Date   Anxiety    Arterial occlusive disease Nov. 2014   Arthritis    Claudication of lower extremity [facility]) Nov. 2014   Right Lower Extremity rest pain   Colon polyps    Depression    Diabetes mellitus without complication ([place])    GERD (gastroesophageal reflux disease)    Hyperlipidemia    Migraines    Vertigo     Past Surgical History:  Procedure Laterality Date   ABDOMINAL AORTAGRAM N/A 01/10/2013   Procedure: ABDOMINAL AORTAGRAM;  Surgeon: Rosetta Posner, MD;  Location: [facility] CATH LAB;  Service: Cardiovascular;  Laterality: N/A;   ABDOMINAL AORTOGRAM W/LOWER EXTREMITY Bilateral 07/16/2018   Procedure: ABDOMINAL AORTOGRAM W/LOWER EXTREMITY;  Surgeon: Angelia Mould, MD;  Location: [place] CV LAB;  Service: Cardiovascular;  Laterality:  Bilateral;   ABDOMINAL AORTOGRAM W/LOWER EXTREMITY N/A 10/05/2020   Procedure: ABDOMINAL AORTOGRAM W/LOWER EXTREMITY;  Surgeon: Angelia Mould, MD;  Location: [place] CV LAB;  Service: Cardiovascular;  Laterality: N/A;   BUBBLE STUDY  03/26/2021   Procedure: BUBBLE STUDY;  Surgeon: Donato Heinz, MD;  Location: [facility] ENDOSCOPY;  Service: Cardiovascular;;   CHOLECYSTECTOMY     Gall Bladder   iliac artery angioplasty and stent placement  01/10/13   LOWER EXTREMITY ANGIOGRAM Bilateral 01/10/2013   Procedure: LOWER EXTREMITY ANGIOGRAM;  Surgeon: Rosetta Posner, MD;  Location: [facility] CATH LAB;  Service: Cardiovascular;  Laterality: Bilateral;   PERCUTANEOUS STENT INTERVENTION Right 01/10/2013   Procedure: PERCUTANEOUS STENT INTERVENTION;  Surgeon: Rosetta Posner, MD;  Location: [facility] CATH LAB;  Service: Cardiovascular;  Laterality: Right;  rt common iliac stent   PERIPHERAL VASCULAR CATHETERIZATION N/A 11/26/2015   Procedure: Abdominal Aortogram w/Lower Extremity;  Surgeon: Angelia Mould, MD;  Location: [place] CV LAB;  Service: Cardiovascular;  Laterality: N/A;   PERIPHERAL VASCULAR CATHETERIZATION Right 11/26/2015   Procedure: Peripheral Vascular Balloon Angioplasty;  Surgeon: Angelia Mould, MD;  Location: [place] CV LAB;  Service: Cardiovascular;  Laterality: Right;  rt common iliac   PERIPHERAL VASCULAR INTERVENTION  07/16/2018   Procedure: PERIPHERAL VASCULAR INTERVENTION;  Surgeon: Angelia Mould, MD;  Location: [place] CV LAB;  Service: Cardiovascular;;   PERIPHERAL VASCULAR INTERVENTION Right 10/05/2020   Procedure: PERIPHERAL VASCULAR INTERVENTION;  Surgeon: Angelia Mould, MD;  Location: [place] CV LAB;  Service: Cardiovascular;  Laterality: Right;  Common and exteral iliac artery   TEE WITHOUT CARDIOVERSION N/A 03/26/2021   Procedure: TRANSESOPHAGEAL ECHOCARDIOGRAM (TEE);  Surgeon: Donato Heinz, MD;  Location: [facility] ENDOSCOPY;   Service: Cardiovascular;  Laterality: N/A;   TOTAL ABDOMINAL HYSTERECTOMY      Social History   Socioeconomic History   Marital status: Married    Spouse name: Not on file   Number of children: 3   Years of education: Not on file   Highest education level: Not on file  Occupational History   Not on file  Tobacco Use   Smoking status: Former    Packs/day: 0.25    Types: Cigarettes    Start date: 05/16/1977    Quit date: 03/21/2021    Years since quitting: 0.9   Smokeless tobacco: Never   Tobacco comments:    1/2 cigarette per day  Vaping Use   Vaping Use: Never used  Substance and Sexual Activity   Alcohol use: Yes    Alcohol/week: 1.0 standard drink of alcohol    Types: 1 Shots of liquor per week    Comment: occ   Drug use: No   Sexual activity: Yes    Partners: Male    Birth control/protection: Surgical  Other Topics Concern   Not on file  Social History Narrative   Not on file   Social Determinants of Health   Financial Resource Strain: Not on file  Food Insecurity: Not on file  Transportation Needs: Not on file  Physical Activity: Not on file  Stress: Not on file  Social Connections: Not on file  Intimate Partner Violence: Not on file    Outpatient Encounter Medications as of 02/20/2022  Medication Sig   Alirocumab (PRALUENT) 75 MG/ML SOAJ INJECT 75 MG SUBCUTANEOUSLY EVERY 14 DAYS   aspirin EC 81 MG tablet Take 81 mg by mouth daily.   clopidogrel (PLAVIX) 75 MG tablet TAKE 1 TABLET BY MOUTH EVERY DAY   Continuous Blood Gluc Receiver (FREESTYLE LIBRE 2 READER) DEVI 1 Device by Does not apply route continuous.   Continuous Blood Gluc Sensor (FREESTYLE LIBRE 2 SENSOR) MISC 1 Device by Does not apply route every 14 (fourteen) days.   ezetimibe (ZETIA) 10 MG tablet Take 1 tablet (10 mg total) by mouth daily.   FLUoxetine (PROZAC) 40 MG capsule Take 1 capsule (40 mg total) by mouth daily.   levocetirizine (XYZAL) 5 MG tablet Take 1 tablet (5 mg total) by mouth  every evening.   metFORMIN (GLUCOPHAGE) 500 MG tablet TAKE 2 TABLETS DAILY WITH BREAKFAST AND 1 TABLET WITH SUPPER   mirtazapine (REMERON) 7.5 MG tablet TAKE 1 TABLET BY MOUTH AT BEDTIME.   montelukast (SINGULAIR) 10 MG tablet Take 1 tablet (10 mg total) by mouth at bedtime.   rOPINIRole (REQUIP) 0.25 MG tablet TAKE 1-2 TABLETS (0.25-0.5 MG TOTAL) BY MOUTH AT BEDTIME.   rosuvastatin (CRESTOR) 40 MG tablet Take 1 tablet (40 mg total) by mouth daily.   [DISCONTINUED] losartan (COZAAR) 50 MG tablet TAKE 1 TABLET BY MOUTH EVERY DAY   losartan (COZAAR) 50 MG tablet Take 2 tablets (100 mg total) by mouth daily.   No facility-administered encounter medications on file as of 02/20/2022.    Allergies  Allergen Reactions   Asa [Aspirin] Rash    Can take low-dose aspirin.   Bempedoic Acid Other (See Comments)    Bilateral shoulder pain.   Codeine  Rash   Penicillins Rash    Has patient had a PCN reaction causing immediate rash, facial/tongue/throat swelling, SOB or lightheadedness with hypotension:Yes Has patient had a PCN reaction causing severe rash involving mucus membranes or skin necrosis:Yes Has patient had a PCN reaction that required hospitalization:No Has patient had a PCN reaction occurring within the last 10 years:No If all of the above answers are "NO", then may proceed with Cephalosporin use.     Review of Systems  Constitutional:  Negative for activity change, appetite change, chills, diaphoresis, fatigue, fever and unexpected weight change.  HENT: Negative.    Eyes: Negative.  Negative for photophobia.  Respiratory:  Negative for cough, chest tightness and shortness of breath.   Cardiovascular:  Negative for chest pain, palpitations and leg swelling.  Gastrointestinal:  Negative for abdominal pain, blood in stool, constipation, diarrhea, nausea and vomiting.  Endocrine: Negative.   Genitourinary:  Negative for decreased urine volume, difficulty urinating, dysuria, frequency and  urgency.  Musculoskeletal:  Negative for arthralgias and myalgias.  Skin: Negative.   Allergic/Immunologic: Negative.   Neurological:  Positive for dizziness and headaches. Negative for tremors, seizures, syncope, facial asymmetry, speech difficulty, weakness, light-headedness and numbness.  Hematological: Negative.   Psychiatric/Behavioral:  Negative for confusion, hallucinations, sleep disturbance and suicidal ideas.   All other systems reviewed and are negative.       Objective:  BP (!) 195/84   Pulse 78   Temp 97.8 F (36.6 C) (Oral)   Ht '5\' 1"'$  (1.549 m)   Wt 157 lb (71.2 kg)   BMI 29.66 kg/m    Wt Readings from Last 3 Encounters:  02/20/22 157 lb (71.2 kg)  02/04/22 147 lb (66.7 kg)  09/09/21 151 lb 6.4 oz (68.7 kg)    Physical Exam Vitals and nursing note reviewed.  Constitutional:      General: She is not in acute distress.    Appearance: Normal appearance. She is well-developed and well-groomed. She is not ill-appearing, toxic-appearing or diaphoretic.  HENT:     Head: Normocephalic and atraumatic.     Jaw: There is normal jaw occlusion.     Right Ear: Hearing normal.     Left Ear: Hearing normal.     Nose: Nose normal.     Mouth/Throat:     Lips: Pink.     Mouth: Mucous membranes are moist.     Pharynx: Uvula midline.  Eyes:     General: Lids are normal.     Conjunctiva/sclera: Conjunctivae normal.     Pupils: Pupils are equal, round, and reactive to light.  Neck:     Thyroid: No thyroid mass, thyromegaly or thyroid tenderness.     Vascular: No carotid bruit or JVD.     Trachea: Trachea and phonation normal.  Cardiovascular:     Rate and Rhythm: Normal rate and regular rhythm.     Chest Wall: PMI is not displaced.     Pulses: Normal pulses.     Heart sounds: Murmur heard.     Systolic murmur is present with a grade of 3/6.     No friction rub. No gallop.  Pulmonary:     Effort: Pulmonary effort is normal. No respiratory distress.     Breath sounds:  Normal breath sounds. No wheezing.  Abdominal:     General: There is no abdominal bruit.     Palpations: There is no hepatomegaly or splenomegaly.  Musculoskeletal:        General: Normal range of motion.  Cervical back: Normal range of motion and neck supple.     Right lower leg: No edema.     Left lower leg: No edema.  Lymphadenopathy:     Cervical: No cervical adenopathy.  Skin:    General: Skin is warm and dry.     Capillary Refill: Capillary refill takes less than 2 seconds.     Coloration: Skin is not cyanotic, jaundiced or pale.     Findings: No rash.  Neurological:     General: No focal deficit present.     Mental Status: She is alert and oriented to person, place, and time.     Sensory: Sensation is intact.     Motor: Motor function is intact.     Coordination: Coordination is intact.     Gait: Gait is intact.     Deep Tendon Reflexes: Reflexes are normal and symmetric.  Psychiatric:        Attention and Perception: Attention and perception normal.        Mood and Affect: Mood and affect normal.        Speech: Speech normal.        Behavior: Behavior normal. Behavior is cooperative.        Thought Content: Thought content normal.        Cognition and Memory: Cognition and memory normal.        Judgment: Judgment normal.     Results for orders placed or performed in visit on 02/10/22  CUP PACEART REMOTE DEVICE CHECK  Result Value Ref Range   Date Time Interrogation Session [id number]    Pulse Generator Manufacturer MERM    Pulse Gen Model LNQ22 LINQ II    Pulse Gen Serial Number [id number] [facility]    Implantable Pulse Generator Type ICM/ILR    Implantable Pulse Generator Implant Date [id number]        Pertinent labs & imaging results that were available during my care of the patient were reviewed by me and considered in my medical decision making.  Assessment & Plan:  Hannah Mcdaniel was seen today for hypertension.  Diagnoses and all  orders for this visit:  Hypertension associated with diabetes ([place]) Uncontrolled hypertension Will increase losartan to 100 mg daily. Labs pending. DASH diet discussed. Aware of red flags which require emergent evaluation and treatment.  -     BMP8+EGFR -     CBC with Differential/Platelet -     Thyroid Panel With TSH -     losartan (COZAAR) 50 MG tablet; Take 2 tablets (100 mg total) by mouth daily.     Continue all other maintenance medications.  Follow up plan: Return in about 4 weeks (around 03/20/2022) for HTN.   Continue healthy lifestyle choices, including diet (rich in fruits, vegetables, and lean proteins, and low in salt and simple carbohydrates) and exercise (at least 30 minutes of moderate physical activity daily).  Educational handout given for DASH diet, HTN  The above assessment and management plan was discussed with the patient. The patient verbalized understanding of and has agreed to the management plan. Patient is aware to call the clinic if they develop any new symptoms or if symptoms persist or worsen. Patient is aware when to return to the clinic for a follow-up visit. Patient educated on when it is appropriate to go to the emergency department.   Monia Pouch, FNP-C [facility] [phone]

## 2022-02-20 NOTE — Patient Instructions (Signed)

## 2022-02-21 LAB — CBC WITH DIFFERENTIAL/PLATELET
Basophils Absolute: 0.1 10*3/uL (ref 0.0–0.2)
Basos: 1 %
EOS (ABSOLUTE): 0.1 10*3/uL (ref 0.0–0.4)
Eos: 1 %
Hematocrit: 44.9 % (ref 34.0–46.6)
Hemoglobin: 15.6 g/dL (ref 11.1–15.9)
Immature Grans (Abs): 0 10*3/uL (ref 0.0–0.1)
Immature Granulocytes: 0 %
Lymphocytes Absolute: 3.3 10*3/uL — ABNORMAL HIGH (ref 0.7–3.1)
Lymphs: 43 %
MCH: 31.2 pg (ref 26.6–33.0)
MCHC: 34.7 g/dL (ref 31.5–35.7)
MCV: 90 fL (ref 79–97)
Monocytes Absolute: 0.4 10*3/uL (ref 0.1–0.9)
Monocytes: 5 %
Neutrophils Absolute: 4 10*3/uL (ref 1.4–7.0)
Neutrophils: 50 %
Platelets: 283 10*3/uL (ref 150–450)
RBC: 5 x10E6/uL (ref 3.77–5.28)
RDW: 12 % (ref 11.7–15.4)
WBC: 7.8 10*3/uL (ref 3.4–10.8)

## 2022-02-21 LAB — THYROID PANEL WITH TSH
Free Thyroxine Index: 3.6 (ref 1.2–4.9)
T3 Uptake Ratio: 31 % (ref 24–39)
T4, Total: 11.5 ug/dL (ref 4.5–12.0)
TSH: 1.95 u[IU]/mL (ref 0.450–4.500)

## 2022-02-21 LAB — BMP8+EGFR
BUN/Creatinine Ratio: 11 (ref 9–23)
BUN: 12 mg/dL (ref 6–24)
CO2: 22 mmol/L (ref 20–29)
Calcium: 9.7 mg/dL (ref 8.7–10.2)
Chloride: 103 mmol/L (ref 96–106)
Creatinine, Ser: 1.08 mg/dL — ABNORMAL HIGH (ref 0.57–1.00)
Glucose: 83 mg/dL (ref 70–99)
Potassium: 3.7 mmol/L (ref 3.5–5.2)
Sodium: 141 mmol/L (ref 134–144)
eGFR: 60 mL/min/{1.73_m2} (ref >59)

## 2022-03-05 ENCOUNTER — Other Ambulatory Visit: Payer: Self-pay | Admitting: Family Medicine

## 2022-03-05 DIAGNOSIS — G2581 Restless legs syndrome: Secondary | ICD-10-CM

## 2022-03-16 LAB — CUP PACEART REMOTE DEVICE CHECK
Date Time Interrogation Session: 20240209231214
Implantable Pulse Generator Implant Date: 20230508

## 2022-03-17 ENCOUNTER — Ambulatory Visit: Payer: 59

## 2022-03-17 DIAGNOSIS — I639 Cerebral infarction, unspecified: Secondary | ICD-10-CM

## 2022-03-17 NOTE — Progress Notes (Signed)
Carelink Summary Report / Loop Recorder 

## 2022-03-23 ENCOUNTER — Other Ambulatory Visit: Payer: Self-pay | Admitting: Family Medicine

## 2022-03-23 DIAGNOSIS — E1159 Type 2 diabetes mellitus with other circulatory complications: Secondary | ICD-10-CM

## 2022-03-23 DIAGNOSIS — I10 Essential (primary) hypertension: Secondary | ICD-10-CM

## 2022-04-08 ENCOUNTER — Other Ambulatory Visit: Payer: Self-pay

## 2022-04-08 DIAGNOSIS — Z95828 Presence of other vascular implants and grafts: Secondary | ICD-10-CM

## 2022-04-08 DIAGNOSIS — I739 Peripheral vascular disease, unspecified: Secondary | ICD-10-CM

## 2022-04-08 DIAGNOSIS — I70219 Atherosclerosis of native arteries of extremities with intermittent claudication, unspecified extremity: Secondary | ICD-10-CM

## 2022-04-15 LAB — HM DIABETES EYE EXAM

## 2022-04-16 ENCOUNTER — Ambulatory Visit (INDEPENDENT_AMBULATORY_CARE_PROVIDER_SITE_OTHER): Payer: 59

## 2022-04-16 DIAGNOSIS — I639 Cerebral infarction, unspecified: Secondary | ICD-10-CM | POA: Diagnosis not present

## 2022-04-18 LAB — CUP PACEART REMOTE DEVICE CHECK
Date Time Interrogation Session: 20240313231228
Implantable Pulse Generator Implant Date: 20230508

## 2022-04-24 ENCOUNTER — Ambulatory Visit: Payer: 59 | Admitting: Vascular Surgery

## 2022-04-24 ENCOUNTER — Ambulatory Visit (HOSPITAL_COMMUNITY)
Admission: RE | Admit: 2022-04-24 | Discharge: 2022-04-24 | Disposition: A | Discharge status: Home / Self Care | Payer: 59 | Source: Ambulatory Visit | Attending: Vascular Surgery | Admitting: Vascular Surgery

## 2022-04-24 ENCOUNTER — Encounter: Payer: Self-pay | Admitting: Family Medicine

## 2022-04-24 ENCOUNTER — Encounter: Payer: Self-pay | Admitting: Vascular Surgery

## 2022-04-24 ENCOUNTER — Ambulatory Visit (INDEPENDENT_AMBULATORY_CARE_PROVIDER_SITE_OTHER): Payer: 59 | Admitting: Family Medicine

## 2022-04-24 ENCOUNTER — Ambulatory Visit (INDEPENDENT_AMBULATORY_CARE_PROVIDER_SITE_OTHER)
Admission: RE | Admit: 2022-04-24 | Discharge: 2022-04-24 | Disposition: A | Discharge status: Home / Self Care | Payer: 59 | Source: Ambulatory Visit | Attending: Vascular Surgery | Admitting: Vascular Surgery

## 2022-04-24 VITALS — BP 187/101 | HR 70 | Temp 97.9°F | Resp 20 | Ht 61.0 in | Wt 138.0 lb

## 2022-04-24 VITALS — BP 165/111 | HR 83 | Temp 97.6°F | Ht 61.0 in | Wt 139.2 lb

## 2022-04-24 DIAGNOSIS — I10 Essential (primary) hypertension: Secondary | ICD-10-CM

## 2022-04-24 DIAGNOSIS — Z95828 Presence of other vascular implants and grafts: Secondary | ICD-10-CM

## 2022-04-24 DIAGNOSIS — L02412 Cutaneous abscess of left axilla: Secondary | ICD-10-CM

## 2022-04-24 DIAGNOSIS — I739 Peripheral vascular disease, unspecified: Secondary | ICD-10-CM | POA: Insufficient documentation

## 2022-04-24 DIAGNOSIS — I70219 Atherosclerosis of native arteries of extremities with intermittent claudication, unspecified extremity: Secondary | ICD-10-CM | POA: Insufficient documentation

## 2022-04-24 LAB — VAS US ABI WITH/WO TBI
Left ABI: 1
Right ABI: 0.95

## 2022-04-24 MED ORDER — CLOPIDOGREL BISULFATE 75 MG PO TABS: 75.0000 mg | ORAL_TABLET | Freq: Every day | ORAL | 11 refills | Status: DC

## 2022-04-24 MED ORDER — SULFAMETHOXAZOLE-TRIMETHOPRIM 800-160 MG PO TABS: 1.0000 | ORAL_TABLET | Freq: Two times a day (BID) | ORAL | 0 refills | Status: AC

## 2022-04-24 MED ORDER — HYDROCHLOROTHIAZIDE 25 MG PO TABS: 25.0000 mg | ORAL_TABLET | Freq: Every day | ORAL | 3 refills | Status: DC

## 2022-04-24 NOTE — Progress Notes (Signed)
Subjective:  Patient ID: Hannah Mcdaniel, female    DOB: 22-Oct-1965, 57 y.o.   MRN: [id number]  Patient Care Team: Pryor Ochoa, FNP as PCP - General (Family Medicine) Sueanne Margarita, MD as PCP - Cardiology (Cardiology) Angelia Mould, MD as Consulting Physician (Vascular Surgery) Harlen Labs, MD as Referring Physician (Optometry)   Chief Complaint:  Recurrent Skin Infections (Boil on left axillary x 1 week & on left breast x 4 days )   HPI: Hannah Mcdaniel is a 57 y.o. female presenting on 04/24/2022 for Recurrent Skin Infections (Boil on left axillary x 1 week & on left breast x 4 days )   Pt presents today for evaluation of abscess to left axilla and left breast. States they have been present for several days. States the one under her arm has increased in size and tenderness over the last few days. Does have erythema. No drainage. Has been using warm compresses without resolution of symptoms.  Her blood pressure is noted to be elevated again in office today. States she has been taking medications as prescribed.        Relevant past medical, surgical, family, and social history reviewed and updated as indicated.  Allergies and medications reviewed and updated. Data reviewed: Chart in Epic.   Past Medical History:  Diagnosis Date   Anxiety    Arterial occlusive disease Nov. 2014   Arthritis    Claudication of lower extremity [facility]) Nov. 2014   Right Lower Extremity rest pain   Colon polyps    Depression    Diabetes mellitus without complication ([place])    GERD (gastroesophageal reflux disease)    Hyperlipidemia    Migraines    Vertigo     Past Surgical History:  Procedure Laterality Date   ABDOMINAL AORTAGRAM N/A 01/10/2013   Procedure: ABDOMINAL AORTAGRAM;  Surgeon: Rosetta Posner, MD;  Location: [facility] CATH LAB;  Service: Cardiovascular;  Laterality: N/A;   ABDOMINAL AORTOGRAM W/LOWER EXTREMITY Bilateral 07/16/2018   Procedure: ABDOMINAL  AORTOGRAM W/LOWER EXTREMITY;  Surgeon: Angelia Mould, MD;  Location: [place] CV LAB;  Service: Cardiovascular;  Laterality: Bilateral;   ABDOMINAL AORTOGRAM W/LOWER EXTREMITY N/A 10/05/2020   Procedure: ABDOMINAL AORTOGRAM W/LOWER EXTREMITY;  Surgeon: Angelia Mould, MD;  Location: [place] CV LAB;  Service: Cardiovascular;  Laterality: N/A;   BUBBLE STUDY  03/26/2021   Procedure: BUBBLE STUDY;  Surgeon: Donato Heinz, MD;  Location: [facility] ENDOSCOPY;  Service: Cardiovascular;;   CHOLECYSTECTOMY     Gall Bladder   iliac artery angioplasty and stent placement  01/10/13   LOWER EXTREMITY ANGIOGRAM Bilateral 01/10/2013   Procedure: LOWER EXTREMITY ANGIOGRAM;  Surgeon: Rosetta Posner, MD;  Location: [facility] CATH LAB;  Service: Cardiovascular;  Laterality: Bilateral;   PERCUTANEOUS STENT INTERVENTION Right 01/10/2013   Procedure: PERCUTANEOUS STENT INTERVENTION;  Surgeon: Rosetta Posner, MD;  Location: [facility] CATH LAB;  Service: Cardiovascular;  Laterality: Right;  rt common iliac stent   PERIPHERAL VASCULAR CATHETERIZATION N/A 11/26/2015   Procedure: Abdominal Aortogram w/Lower Extremity;  Surgeon: Angelia Mould, MD;  Location: [place] CV LAB;  Service: Cardiovascular;  Laterality: N/A;   PERIPHERAL VASCULAR CATHETERIZATION Right 11/26/2015   Procedure: Peripheral Vascular Balloon Angioplasty;  Surgeon: Angelia Mould, MD;  Location: [place] CV LAB;  Service: Cardiovascular;  Laterality: Right;  rt common iliac   PERIPHERAL VASCULAR INTERVENTION  07/16/2018   Procedure: PERIPHERAL VASCULAR INTERVENTION;  Surgeon: Angelia Mould,  MD;  Location: [place] CV LAB;  Service: Cardiovascular;;   PERIPHERAL VASCULAR INTERVENTION Right 10/05/2020   Procedure: PERIPHERAL VASCULAR INTERVENTION;  Surgeon: Angelia Mould, MD;  Location: [place] CV LAB;  Service: Cardiovascular;  Laterality: Right;  Common and exteral iliac artery   TEE WITHOUT CARDIOVERSION  N/A 03/26/2021   Procedure: TRANSESOPHAGEAL ECHOCARDIOGRAM (TEE);  Surgeon: Donato Heinz, MD;  Location: [facility] ENDOSCOPY;  Service: Cardiovascular;  Laterality: N/A;   TOTAL ABDOMINAL HYSTERECTOMY      Social History   Socioeconomic History   Marital status: Married    Spouse name: Not on file   Number of children: 3   Years of education: Not on file   Highest education level: Not on file  Occupational History   Not on file  Tobacco Use   Smoking status: Former    Packs/day: .25    Types: Cigarettes    Start date: 05/16/1977    Quit date: 03/21/2021    Years since quitting: 1.0   Smokeless tobacco: Never   Tobacco comments:    1/2 cigarette per day  Vaping Use   Vaping Use: Never used  Substance and Sexual Activity   Alcohol use: Yes    Alcohol/week: 1.0 standard drink of alcohol    Types: 1 Shots of liquor per week    Comment: occ   Drug use: No   Sexual activity: Yes    Partners: Male    Birth control/protection: Surgical  Other Topics Concern   Not on file  Social History Narrative   Not on file   Social Determinants of Health   Financial Resource Strain: Not on file  Food Insecurity: Not on file  Transportation Needs: Not on file  Physical Activity: Not on file  Stress: Not on file  Social Connections: Not on file  Intimate Partner Violence: Not on file    Outpatient Encounter Medications as of 04/24/2022  Medication Sig   Alirocumab (PRALUENT) 75 MG/ML SOAJ INJECT 75 MG SUBCUTANEOUSLY EVERY 14 DAYS   aspirin EC 81 MG tablet Take 81 mg by mouth daily.   clopidogrel (PLAVIX) 75 MG tablet TAKE 1 TABLET BY MOUTH EVERY DAY   clopidogrel (PLAVIX) 75 MG tablet Take 1 tablet (75 mg total) by mouth daily.   Continuous Blood Gluc Receiver (FREESTYLE LIBRE 2 READER) DEVI 1 Device by Does not apply route continuous.   Continuous Blood Gluc Sensor (FREESTYLE LIBRE 2 SENSOR) MISC 1 Device by Does not apply route every 14 (fourteen) days.   ezetimibe (ZETIA) 10  MG tablet Take 1 tablet (10 mg total) by mouth daily.   FLUoxetine (PROZAC) 40 MG capsule Take 1 capsule (40 mg total) by mouth daily.   hydrochlorothiazide (HYDRODIURIL) 25 MG tablet Take 1 tablet (25 mg total) by mouth daily.   levocetirizine (XYZAL) 5 MG tablet Take 1 tablet (5 mg total) by mouth every evening.   losartan (COZAAR) 50 MG tablet TAKE 2 TABLETS BY MOUTH EVERY DAY   metFORMIN (GLUCOPHAGE) 500 MG tablet TAKE 2 TABLETS DAILY WITH BREAKFAST AND 1 TABLET WITH SUPPER   mirtazapine (REMERON) 7.5 MG tablet TAKE 1 TABLET BY MOUTH AT BEDTIME.   montelukast (SINGULAIR) 10 MG tablet Take 1 tablet (10 mg total) by mouth at bedtime.   rOPINIRole (REQUIP) 0.25 MG tablet TAKE 1-2 TABLETS (0.25-0.5 MG TOTAL) BY MOUTH AT BEDTIME.   rosuvastatin (CRESTOR) 40 MG tablet Take 1 tablet (40 mg total) by mouth daily.   sulfamethoxazole-trimethoprim (BACTRIM DS) 800-160 MG  tablet Take 1 tablet by mouth 2 (two) times daily for 10 days.   No facility-administered encounter medications on file as of 04/24/2022.    Allergies  Allergen Reactions   Asa [Aspirin] Rash    Can take low-dose aspirin.   Bempedoic Acid Other (See Comments)    Bilateral shoulder pain.   Codeine Rash   Penicillins Rash    Has patient had a PCN reaction causing immediate rash, facial/tongue/throat swelling, SOB or lightheadedness with hypotension:Yes Has patient had a PCN reaction causing severe rash involving mucus membranes or skin necrosis:Yes Has patient had a PCN reaction that required hospitalization:No Has patient had a PCN reaction occurring within the last 10 years:No If all of the above answers are "NO", then may proceed with Cephalosporin use.     Review of Systems  Constitutional:  Positive for activity change, appetite change and fatigue. Negative for chills, diaphoresis, fever and unexpected weight change.  HENT: Negative.    Eyes:  Positive for visual disturbance. Negative for photophobia.  Respiratory:   Negative for cough, chest tightness and shortness of breath.   Cardiovascular:  Negative for chest pain, palpitations and leg swelling.  Gastrointestinal:  Negative for abdominal pain, blood in stool, constipation, diarrhea, nausea and vomiting.  Endocrine: Negative.  Negative for polydipsia, polyphagia and polyuria.  Genitourinary:  Negative for decreased urine volume, difficulty urinating, dysuria, frequency and urgency.  Musculoskeletal:  Negative for arthralgias and myalgias.  Skin:  Positive for color change and wound. Negative for pallor and rash.  Allergic/Immunologic: Negative.   Neurological:  Negative for dizziness, tremors, seizures, syncope, facial asymmetry, speech difficulty, weakness, light-headedness, numbness and headaches.  Hematological: Negative.   Psychiatric/Behavioral:  Negative for agitation, behavioral problems, confusion, decreased concentration, dysphoric mood, hallucinations, self-injury, sleep disturbance and suicidal ideas. The patient is not nervous/anxious and is not hyperactive.   All other systems reviewed and are negative.       Objective:  BP (!) 165/111   Pulse 83   Temp 97.6 F (36.4 C) (Temporal)   Ht 5\' 1"  (1.549 m)   Wt 139 lb 3.2 oz (63.1 kg)   SpO2 96%   BMI 26.30 kg/m    Wt Readings from Last 3 Encounters:  04/24/22 139 lb 3.2 oz (63.1 kg)  04/24/22 138 lb (62.6 kg)  02/20/22 157 lb (71.2 kg)    Physical Exam Vitals and nursing note reviewed.  Constitutional:      General: She is not in acute distress.    Appearance: Normal appearance. She is well-developed, well-groomed and overweight. She is ill-appearing (chronically). She is not toxic-appearing or diaphoretic.  HENT:     Head: Normocephalic and atraumatic.     Jaw: There is normal jaw occlusion.     Right Ear: Hearing normal.     Left Ear: Hearing normal.     Nose: Nose normal.     Mouth/Throat:     Lips: Pink.     Mouth: Mucous membranes are moist.     Pharynx: Oropharynx  is clear. Uvula midline.  Eyes:     General: Lids are normal.     Extraocular Movements: Extraocular movements intact.     Conjunctiva/sclera: Conjunctivae normal.     Pupils: Pupils are equal, round, and reactive to light.  Neck:     Thyroid: No thyroid mass, thyromegaly or thyroid tenderness.     Vascular: No carotid bruit or JVD.     Trachea: Trachea and phonation normal.  Cardiovascular:  Rate and Rhythm: Normal rate and regular rhythm.     Chest Wall: PMI is not displaced.     Pulses: Normal pulses.     Heart sounds: Normal heart sounds. No murmur heard.    No friction rub. No gallop.  Pulmonary:     Effort: Pulmonary effort is normal. No respiratory distress.     Breath sounds: Normal breath sounds. No wheezing.  Abdominal:     General: Bowel sounds are normal. There is no distension or abdominal bruit.     Palpations: Abdomen is soft. There is no hepatomegaly or splenomegaly.     Tenderness: There is no abdominal tenderness. There is no right CVA tenderness or left CVA tenderness.     Hernia: No hernia is present.  Musculoskeletal:        General: Normal range of motion.     Cervical back: Normal range of motion and neck supple.     Right lower leg: No edema.     Left lower leg: No edema.  Lymphadenopathy:     Cervical: No cervical adenopathy.  Skin:    General: Skin is warm and dry.     Capillary Refill: Capillary refill takes less than 2 seconds.     Coloration: Skin is not cyanotic, jaundiced or pale.     Findings: Abscess (approximate 4 cm x 4 cm, erythema, tenderness, flucutant, left axilla) and erythema present. No rash.       Neurological:     General: No focal deficit present.     Mental Status: She is alert and oriented to person, place, and time.     Sensory: Sensation is intact.     Motor: Motor function is intact.     Coordination: Coordination is intact.     Gait: Gait is intact.     Deep Tendon Reflexes: Reflexes are normal and symmetric.   Psychiatric:        Attention and Perception: Attention and perception normal.        Mood and Affect: Mood and affect normal.        Speech: Speech normal.        Behavior: Behavior normal. Behavior is cooperative.        Thought Content: Thought content normal.        Cognition and Memory: Cognition and memory normal.        Judgment: Judgment normal.     Results for orders placed or performed during the hospital encounter of 04/24/22  VAS [place] ABI WITH/WO TBI  Result Value Ref Range   Right ABI 0.95    Left ABI 1      I & D  Date/Time: 04/24/2022 4:35 PM  Performed by: Baruch Gouty, FNP Authorized by: Baruch Gouty, FNP   Consent:    Consent obtained:  Verbal   Consent given by:  Patient   Risks, benefits, and alternatives were discussed: yes     Risks discussed:  Bleeding, incomplete drainage, pain, infection and damage to other organs   Alternatives discussed:  No treatment, delayed treatment, alternative treatment, observation and referral Universal protocol:    Procedure explained and questions answered to patient or proxy's satisfaction: yes     Relevant documents present and verified: yes     Immediately prior to procedure a time out was called: yes     Patient identity confirmed:  Verbally with patient Location:    Type:  Abscess   Location:  Upper extremity   Upper extremity  location: left axilla. Pre-procedure details:    Skin preparation:  Alcohol and povidone-iodine Sedation:    Sedation type:  None Anesthesia:    Anesthesia method:  Local infiltration   Local anesthetic:  Lidocaine 2% w/o epi Procedure type:    Complexity:  Complex Procedure details:    Needle aspiration: no     Incision types:  Single straight   Scalpel blade:  11   Wound management:  Probed and deloculated, irrigated with saline and extensive cleaning   Drainage:  Purulent and bloody   Drainage amount:  Copious   Wound treatment:  Wound left open Post-procedure details:     Procedure completion:  Tolerated well, no immediate complications    Pertinent labs & imaging results that were available during my care of the patient were reviewed by me and considered in my medical decision making.  Assessment & Plan:  Azion was seen today for recurrent skin infections.  Diagnoses and all orders for this visit:  Abscess of axilla, left I&D in office today. Tolerated well. Bactrim as prescribed. Hibiclens soak once weekly. Wound recheck in 2 weeks. Wound care discussed in detail.  -     sulfamethoxazole-trimethoprim (BACTRIM DS) 800-160 MG tablet; Take 1 tablet by mouth 2 (two) times daily for 10 days. -     I & D  Uncontrolled hypertension BP not controlled. Changes were made in regimen today, add HCTZ. Goal BP is 130/80. Pt aware to report any persistent high or low readings. DASH diet and exercise encouraged. Exercise at least 150 minutes per week and increase as tolerated. Goal BMI > 25. Stress management encouraged. Avoid nicotine and tobacco product use. Avoid excessive alcohol and NSAID's. Avoid more than 2000 mg of sodium daily. Medications as prescribed. Follow up as scheduled.  -     BMP8+EGFR -     hydrochlorothiazide (HYDRODIURIL) 25 MG tablet; Take 1 tablet (25 mg total) by mouth daily.     Continue all other maintenance medications.  Follow up plan: Return in about 2 weeks (around 05/08/2022), or if symptoms worsen or fail to improve, for HTN, wound recheck.   Continue healthy lifestyle choices, including diet (rich in fruits, vegetables, and lean proteins, and low in salt and simple carbohydrates) and exercise (at least 30 minutes of moderate physical activity daily).  Educational handout given for abscess  The above assessment and management plan was discussed with the patient. The patient verbalized understanding of and has agreed to the management plan. Patient is aware to call the clinic if they develop any new symptoms or if symptoms persist  or worsen. Patient is aware when to return to the clinic for a follow-up visit. Patient educated on when it is appropriate to go to the emergency department.   Monia Pouch, FNP-C [facility] [phone]

## 2022-04-24 NOTE — Progress Notes (Signed)
REASON FOR VISIT:   Follow-up of peripheral arterial disease with claudication.  MEDICAL ISSUES:   PERIPHERAL ARTERIAL DISEASE: Her iliac stents on the right are patent with only some mild recurrent stenosis.  She is on aspirin Plavix and a statin.  I renewed her Plavix given that she has had multiple interventions.  Fortunately she is highly motivated now and has been walking quite a bit.  She quit smoking last year.  She would like to be seen by a physician so I will arrange a follow-up with a physician in 1 year.  HYPERTENSION: The patient's initial blood pressure today was elevated. We repeated this and this was still elevated.  She tells me that she did not take her blood pressure medicine this morning.   HPI:   Hannah Mcdaniel is a pleasant 57 y.o. female who I last saw on 01/03/2021.  She had multiple previous interventions in the right common and external iliac arteries.  Most recently, she underwent angioplasty and stenting of the right common iliac artery and right external iliac artery in September 2022.  When I saw her last she was almost off cigarettes completely.  She was walking 3 miles a day.  She comes in for a 1 year follow-up visit.  Since I saw her last, she has remained off of cigarettes.  She is still ambulating daily and is highly motivated.  I do not get any clear-cut history of claudication.  She denies any history of rest pain.  She does have some numbness in the left foot related to a previous stroke.  Her blood pressure was high today but she did not take her blood pressure medicines this morning.  Past Medical History:  Diagnosis Date   Anxiety    Arterial occlusive disease Nov. 2014   Arthritis    Claudication of lower extremity ([place]) Nov. 2014   Right Lower Extremity rest pain   Colon polyps    Depression    Diabetes mellitus without complication ([place])    GERD (gastroesophageal reflux disease)    Hyperlipidemia    Migraines    Vertigo     Family  History  Problem Relation Age of Onset   Diabetes Mother    Hyperlipidemia Mother    Hypertension Mother    Varicose Veins Mother    COPD Mother    Stroke Mother    Anxiety disorder Mother    Depression Mother    Ovarian cancer Mother    Acute myelogenous leukemia Father    Diabetes Brother    Hypertension Brother    Hyperlipidemia Brother    Stroke Maternal Grandmother    Anxiety disorder Maternal Grandmother    Depression Maternal Grandmother    Breast cancer Maternal Grandmother    Diabetes Maternal Grandmother    Heart disease Maternal Grandfather    Lung cancer Paternal Grandfather    Other Brother        Blood disease   Anxiety disorder Daughter    Migraines Daughter    Migraines Daughter    Migraines Son    Seizures Son     SOCIAL HISTORY: Social History   Tobacco Use   Smoking status: Former    Packs/day: .25    Types: Cigarettes    Start date: 05/16/1977    Quit date: 03/21/2021    Years since quitting: 1.0   Smokeless tobacco: Never   Tobacco comments:    1/2 cigarette per day  Substance Use Topics   Alcohol  use: Yes    Alcohol/week: 1.0 standard drink of alcohol    Types: 1 Shots of liquor per week    Comment: occ    Allergies  Allergen Reactions   Asa [Aspirin] Rash    Can take low-dose aspirin.   Bempedoic Acid Other (See Comments)    Bilateral shoulder pain.   Codeine Rash   Penicillins Rash    Has patient had a PCN reaction causing immediate rash, facial/tongue/throat swelling, SOB or lightheadedness with hypotension:Yes Has patient had a PCN reaction causing severe rash involving mucus membranes or skin necrosis:Yes Has patient had a PCN reaction that required hospitalization:No Has patient had a PCN reaction occurring within the last 10 years:No If all of the above answers are "NO", then may proceed with Cephalosporin use.     Current Outpatient Medications  Medication Sig Dispense Refill   Alirocumab (PRALUENT) 75 MG/ML SOAJ INJECT  75 MG SUBCUTANEOUSLY EVERY 14 DAYS 6 mL 0   aspirin EC 81 MG tablet Take 81 mg by mouth daily.     clopidogrel (PLAVIX) 75 MG tablet TAKE 1 TABLET BY MOUTH EVERY DAY 90 tablet 3   Continuous Blood Gluc Receiver (FREESTYLE LIBRE 2 READER) DEVI 1 Device by Does not apply route continuous. 1 each 0   Continuous Blood Gluc Sensor (FREESTYLE LIBRE 2 SENSOR) MISC 1 Device by Does not apply route every 14 (fourteen) days. 2 each 5   ezetimibe (ZETIA) 10 MG tablet Take 1 tablet (10 mg total) by mouth daily. 90 tablet 1   FLUoxetine (PROZAC) 40 MG capsule Take 1 capsule (40 mg total) by mouth daily. 90 capsule 1   levocetirizine (XYZAL) 5 MG tablet Take 1 tablet (5 mg total) by mouth every evening. 90 tablet 1   losartan (COZAAR) 50 MG tablet TAKE 2 TABLETS BY MOUTH EVERY DAY 180 tablet 1   metFORMIN (GLUCOPHAGE) 500 MG tablet TAKE 2 TABLETS DAILY WITH BREAKFAST AND 1 TABLET WITH SUPPER 270 tablet 0   mirtazapine (REMERON) 7.5 MG tablet TAKE 1 TABLET BY MOUTH AT BEDTIME. 90 tablet 0   montelukast (SINGULAIR) 10 MG tablet Take 1 tablet (10 mg total) by mouth at bedtime. 90 tablet 1   rOPINIRole (REQUIP) 0.25 MG tablet TAKE 1-2 TABLETS (0.25-0.5 MG TOTAL) BY MOUTH AT BEDTIME. 180 tablet 0   rosuvastatin (CRESTOR) 40 MG tablet Take 1 tablet (40 mg total) by mouth daily. 90 tablet 1   No current facility-administered medications for this visit.    REVIEW OF SYSTEMS:  [X]  denotes positive finding, [ ]  denotes negative finding Cardiac  Comments:  Chest pain or chest pressure:    Shortness of breath upon exertion:    Short of breath when lying flat:    Irregular heart rhythm:        Vascular    Pain in calf, thigh, or hip brought on by ambulation:    Pain in feet at night that wakes you up from your sleep:     Blood clot in your veins:    Leg swelling:         Pulmonary    Oxygen at home:    Productive cough:     Wheezing:         Neurologic    Sudden weakness in arms or legs:     Sudden  numbness in arms or legs:     Sudden onset of difficulty speaking or slurred speech:    Temporary loss of vision in one eye:  Problems with dizziness:         Gastrointestinal    Blood in stool:     Vomited blood:         Genitourinary    Burning when urinating:     Blood in urine:        Psychiatric    Major depression:         Hematologic    Bleeding problems:    Problems with blood clotting too easily:        Skin    Rashes or ulcers:        Constitutional    Fever or chills:     PHYSICAL EXAM:   Vitals:   04/24/22 0831  BP: (!) 187/101  Pulse: 70  Resp: 20  Temp: 97.9 F (36.6 C)  SpO2: 96%  Weight: 138 lb (62.6 kg)  Height: 5\' 1"  (1.549 m)    GENERAL: The patient is a well-nourished female, in no acute distress. The vital signs are documented above. CARDIAC: There is a regular rate and rhythm.  VASCULAR: I do not detect carotid bruits. On the right side she has a palpable femoral pulse and palpable posterior tibial pulse. On the left side she has a palpable femoral pulse and dorsalis pedis pulse. PULMONARY: There is good air exchange bilaterally without wheezing or rales. ABDOMEN: Soft and non-tender with normal pitched bowel sounds.  MUSCULOSKELETAL: There are no major deformities or cyanosis. NEUROLOGIC: No focal weakness or paresthesias are detected. SKIN: There are no ulcers or rashes noted. PSYCHIATRIC: The patient has a normal affect.  DATA:    AORTOILIAC DUPLEX: I have independently interpreted her aortoiliac duplex.  Her stents in the right common iliac artery and external iliac artery are widely patent with biphasic flow.  There are some mildly elevated velocities in the mid portion of the common iliac artery with a peak systolic velocity of 123XX123 cm/s.  ARTERIAL DOPPLER STUDY: I have independently interpreted her arterial Doppler study.  On the right side there is a biphasic posterior tibial signal with a monophasic dorsalis pedis signal.   ABIs 95%.  Toe pressures 136 mmHg.  On the left side there is a biphasic dorsalis pedis and posterior tibial signal.  ABIs 100%.  Toe pressures 149 mmHg.    [name] [facility] [phone]

## 2022-04-25 LAB — BMP8+EGFR
BUN/Creatinine Ratio: 11 (ref 9–23)
BUN: 12 mg/dL (ref 6–24)
CO2: 22 mmol/L (ref 20–29)
Calcium: 9.7 mg/dL (ref 8.7–10.2)
Chloride: 101 mmol/L (ref 96–106)
Creatinine, Ser: 1.11 mg/dL — ABNORMAL HIGH (ref 0.57–1.00)
Glucose: 101 mg/dL — ABNORMAL HIGH (ref 70–99)
Potassium: 3.7 mmol/L (ref 3.5–5.2)
Sodium: 139 mmol/L (ref 134–144)
eGFR: 58 mL/min/{1.73_m2} — ABNORMAL LOW (ref >59)

## 2022-04-29 NOTE — Progress Notes (Signed)
Carelink Summary Report / Loop Recorder 

## 2022-05-06 ENCOUNTER — Encounter: Payer: Self-pay | Admitting: Family Medicine

## 2022-05-07 ENCOUNTER — Other Ambulatory Visit: Payer: Self-pay | Admitting: Family Medicine

## 2022-05-07 ENCOUNTER — Encounter: Payer: Self-pay | Admitting: Family Medicine

## 2022-05-07 ENCOUNTER — Ambulatory Visit: Payer: 59 | Admitting: Family Medicine

## 2022-05-07 VITALS — BP 117/65 | HR 74 | Temp 98.7°F | Ht 61.0 in | Wt 142.0 lb

## 2022-05-07 DIAGNOSIS — I739 Peripheral vascular disease, unspecified: Secondary | ICD-10-CM | POA: Diagnosis not present

## 2022-05-07 DIAGNOSIS — R809 Proteinuria, unspecified: Secondary | ICD-10-CM

## 2022-05-07 DIAGNOSIS — E1129 Type 2 diabetes mellitus with other diabetic kidney complication: Secondary | ICD-10-CM

## 2022-05-07 DIAGNOSIS — I152 Hypertension secondary to endocrine disorders: Secondary | ICD-10-CM

## 2022-05-07 DIAGNOSIS — R634 Abnormal weight loss: Secondary | ICD-10-CM | POA: Diagnosis not present

## 2022-05-07 DIAGNOSIS — E785 Hyperlipidemia, unspecified: Secondary | ICD-10-CM

## 2022-05-07 DIAGNOSIS — E1169 Type 2 diabetes mellitus with other specified complication: Secondary | ICD-10-CM

## 2022-05-07 DIAGNOSIS — E559 Vitamin D deficiency, unspecified: Secondary | ICD-10-CM

## 2022-05-07 DIAGNOSIS — F411 Generalized anxiety disorder: Secondary | ICD-10-CM

## 2022-05-07 DIAGNOSIS — E1159 Type 2 diabetes mellitus with other circulatory complications: Secondary | ICD-10-CM

## 2022-05-07 LAB — BAYER DCA HB A1C WAIVED: HB A1C (BAYER DCA - WAIVED): 6.4 % — ABNORMAL HIGH (ref 4.8–5.6)

## 2022-05-07 MED ORDER — DAPAGLIFLOZIN PROPANEDIOL 5 MG PO TABS: 5.0000 mg | ORAL_TABLET | Freq: Every day | ORAL | 0 refills | Status: DC

## 2022-05-07 NOTE — Progress Notes (Signed)
Acute Office Visit  Subjective:  Patient ID: Hannah Mcdaniel, female    DOB: 08/28/1965, 57 y.o.   MRN: [id number]  Chief Complaint  Patient presents with   Medical Management of Chronic Issues    3 month follow up    HPI Patient is in today for chronic condition follow up  Diabetes and Hypertension  And recent CVA 03/17/22 Has implanted loop recorder. Has not had atrial fibrillation recorded yet. May 2023   PAD  Follows with Dr. Scot Dock, seen at end of March.  Stenting of right common iliac artery in Sept 2022  Diabetes  Checking BG daily 80-100 in am. Not tolerating Metformin.  UTD on eye exam  Declines podiatry   HTN BP has been controlled at home. Since last visit has had one BP over 130  Has lost weight. Continues to take losartan   HLD  Is taking zetia, has not been able to afford Praluent. Follows with Cardiology   ROS As per HPI  Objective:  BP 117/65   Pulse 74   Temp 98.7 F (37.1 C)   Ht 5\' 1"  (1.549 m)   Wt 142 lb (64.4 kg)   SpO2 98%   BMI 26.83 kg/m  Filed Weights   05/07/22 0943  Weight: 142 lb (64.4 kg)   02/20/22 157lbs  04/24/22 139 lbs   Physical Exam Constitutional:      General: She is not in acute distress.    Appearance: She is ill-appearing. She is not toxic-appearing.  Cardiovascular:     Rate and Rhythm: Normal rate and regular rhythm.     Heart sounds: Murmur heard.  Pulmonary:     Effort: Pulmonary effort is normal. No respiratory distress.     Breath sounds: Normal breath sounds. No stridor. No wheezing, rhonchi or rales.  Chest:     Chest wall: No tenderness.  Abdominal:     Palpations: Abdomen is soft.  Musculoskeletal:        General: No swelling.     Right lower leg: No edema.     Left lower leg: No edema.  Skin:    General: Skin is warm.     Capillary Refill: Capillary refill takes less than 2 seconds.  Neurological:     General: No focal deficit present.     Mental Status: She is alert and oriented to  person, place, and time. Mental status is at baseline.     Cranial Nerves: No cranial nerve deficit.     Motor: No weakness.     Gait: Gait normal.  Psychiatric:        Mood and Affect: Mood normal.        Behavior: Behavior normal.        Thought Content: Thought content normal.        Judgment: Judgment normal.    Assessment & Plan:  1. Type 2 diabetes mellitus with other specified complication, without long-term current use of insulin A1C <7 today!  Patient is not able to tolerate Metformin due to side effects and self-discontinued medications approximately one month ago. Due to albuminuria and diabetes, will start [place] as below. Discussed with patient and family that presented with her the benefits of adding farxiga. Discussed side effects of medication with patient. Will monitor B12 as patient was taking metformin prior.  Labs as below. Will communicate results to patient once available.  - Bayer DCA Hb A1c Waived - CMP14+EGFR - Vitamin B12 - Ambulatory referral to Nephrology -  VITAMIN D 25 Hydroxy (Vit-D Deficiency, Fractures) - dapagliflozin propanediol (FARXIGA) 5 MG TABS tablet; Take 1 tablet (5 mg total) by mouth daily before breakfast.  Dispense: 30 tablet; Refill: 0  2. Type 2 diabetes mellitus with albuminuria Referral placed as below for worsening renal function on repeat labs.  - Ambulatory referral to Nephrology - dapagliflozin propanediol (FARXIGA) 5 MG TABS tablet; Take 1 tablet (5 mg total) by mouth daily before breakfast.  Dispense: 30 tablet; Refill: 0  3. Hyperlipidemia associated with type 2 diabetes mellitus Labs as below. Will communicate results to patient once available.  Fasting  Patient follows with Vascular and Cardiology for PAD. Unsure if patient is taking crestor. Per patient, was not able to tolerate statins. Continues to have crestor on her med list, but was not dispensed since November 2023 with 90 day supply. Instructed patient to talk with  specialists about prior auth for [place]. She states that she tolerates it well, but is unable to afford it at this time.  - Lipid panel  4. Hypertension associated with diabetes Well controlled at this time. Continue current regimen. Patient has access to home monitor instructed patient to monitor daily. Instructed pt to take BP first thing in the morning after sitting for 5 minutes with feet flat on the floor, arm at heart level. Will review measurements with patient at follow up and determine plan for BP management.  5. PAD (peripheral artery disease) Follows with Vascular and Cardiology.   6. Anxiety, generalized Pt screened positive for anxiety today. Pt offered nonpharmacologic and pharmacologic therapy. Pt declined initiating treatment at this time. Safety contract established today with patient in clinic. Denies intent to harm herself or others. Pt to notify provider if they would like to initiate treatment.   7. Unintentional weight loss Labs as above. Pt weight has increased slightly since hospitalization. Will monitor with labs and pursue further workup if weight loss persists and labs are unremarkable.   The above assessment and management plan was discussed with the patient. The patient verbalized understanding of and has agreed to the management plan using shared-decision making. Patient is aware to call the clinic if they develop any new symptoms or if symptoms fail to improve or worsen. Patient is aware when to return to the clinic for a follow-up visit. Patient educated on when it is appropriate to go to the emergency department.    Donzetta Kohut, DNP-FNP [facility] [address] [place], [place] [address] [phone]

## 2022-05-08 LAB — CMP14+EGFR
ALT: 7 IU/L (ref 0–32)
AST: 14 IU/L (ref 0–40)
Albumin/Globulin Ratio: 2 (ref 1.2–2.2)
Albumin: 4.3 g/dL (ref 3.8–4.9)
Alkaline Phosphatase: 132 IU/L — ABNORMAL HIGH (ref 44–121)
BUN/Creatinine Ratio: 12 (ref 9–23)
BUN: 11 mg/dL (ref 6–24)
Bilirubin Total: 0.4 mg/dL (ref 0.0–1.2)
CO2: 22 mmol/L (ref 20–29)
Calcium: 9.7 mg/dL (ref 8.7–10.2)
Chloride: 102 mmol/L (ref 96–106)
Creatinine, Ser: 0.95 mg/dL (ref 0.57–1.00)
Globulin, Total: 2.1 g/dL (ref 1.5–4.5)
Glucose: 103 mg/dL — ABNORMAL HIGH (ref 70–99)
Potassium: 4.3 mmol/L (ref 3.5–5.2)
Sodium: 138 mmol/L (ref 134–144)
Total Protein: 6.4 g/dL (ref 6.0–8.5)
eGFR: 70 mL/min/{1.73_m2} (ref >59)

## 2022-05-08 LAB — LIPID PANEL
Chol/HDL Ratio: 5.6 ratio — ABNORMAL HIGH (ref 0.0–4.4)
Cholesterol, Total: 235 mg/dL — ABNORMAL HIGH (ref 100–199)
HDL: 42 mg/dL (ref >39)
LDL Chol Calc (NIH): 146 mg/dL — ABNORMAL HIGH (ref 0–99)
Triglycerides: 259 mg/dL — ABNORMAL HIGH (ref 0–149)
VLDL Cholesterol Cal: 47 mg/dL — ABNORMAL HIGH (ref 5–40)

## 2022-05-08 LAB — VITAMIN D 25 HYDROXY (VIT D DEFICIENCY, FRACTURES): Vit D, 25-Hydroxy: 9.6 ng/mL — ABNORMAL LOW (ref 30.0–100.0)

## 2022-05-08 LAB — VITAMIN B12: Vitamin B-12: 293 pg/mL (ref 232–1245)

## 2022-05-08 MED ORDER — EMPAGLIFLOZIN 10 MG PO TABS
10.0000 mg | ORAL_TABLET | Freq: Every day | ORAL | 0 refills | Status: DC
Start: 2022-05-08 — End: 2022-06-04

## 2022-05-08 NOTE — Telephone Encounter (Signed)
JARDIANCE 10 MG TABS tablet        Changed from: dapagliflozin propanediol (FARXIGA) 5 MG TABS tablet    Pharmacy comment: Alternative Requested:NOT COVERE

## 2022-05-08 NOTE — Telephone Encounter (Signed)
Refill failed. resent °

## 2022-05-09 MED ORDER — VITAMIN D (ERGOCALCIFEROL) 1.25 MG (50000 UNIT) PO CAPS
50000.0000 [IU] | ORAL_CAPSULE | ORAL | 0 refills | Status: AC
Start: 2022-05-09 — End: 2022-07-26

## 2022-05-09 NOTE — Addendum Note (Signed)
Addended by: Neale Burly on: 05/09/2022 08:09 AM   Modules accepted: Orders

## 2022-05-19 ENCOUNTER — Ambulatory Visit (INDEPENDENT_AMBULATORY_CARE_PROVIDER_SITE_OTHER): Payer: 59

## 2022-05-19 DIAGNOSIS — I639 Cerebral infarction, unspecified: Secondary | ICD-10-CM

## 2022-05-19 LAB — CUP PACEART REMOTE DEVICE CHECK
Date Time Interrogation Session: 20240414231048
Implantable Pulse Generator Implant Date: 20230508

## 2022-05-22 NOTE — Progress Notes (Signed)
Carelink Summary Report / Loop Recorder 

## 2022-06-04 ENCOUNTER — Encounter: Payer: Self-pay | Admitting: Family Medicine

## 2022-06-04 ENCOUNTER — Other Ambulatory Visit: Payer: Self-pay | Admitting: Family Medicine

## 2022-06-04 ENCOUNTER — Ambulatory Visit (INDEPENDENT_AMBULATORY_CARE_PROVIDER_SITE_OTHER): Payer: 59 | Admitting: Family Medicine

## 2022-06-04 VITALS — BP 134/78 | HR 86 | Temp 97.5°F | Ht 61.0 in | Wt 139.0 lb

## 2022-06-04 DIAGNOSIS — R809 Proteinuria, unspecified: Secondary | ICD-10-CM

## 2022-06-04 DIAGNOSIS — E1151 Type 2 diabetes mellitus with diabetic peripheral angiopathy without gangrene: Secondary | ICD-10-CM

## 2022-06-04 DIAGNOSIS — F331 Major depressive disorder, recurrent, moderate: Secondary | ICD-10-CM | POA: Diagnosis not present

## 2022-06-04 DIAGNOSIS — E1129 Type 2 diabetes mellitus with other diabetic kidney complication: Secondary | ICD-10-CM | POA: Diagnosis not present

## 2022-06-04 DIAGNOSIS — E1169 Type 2 diabetes mellitus with other specified complication: Secondary | ICD-10-CM

## 2022-06-04 DIAGNOSIS — G43001 Migraine without aura, not intractable, with status migrainosus: Secondary | ICD-10-CM

## 2022-06-04 DIAGNOSIS — I739 Peripheral vascular disease, unspecified: Secondary | ICD-10-CM

## 2022-06-04 DIAGNOSIS — E1159 Type 2 diabetes mellitus with other circulatory complications: Secondary | ICD-10-CM

## 2022-06-04 DIAGNOSIS — I152 Hypertension secondary to endocrine disorders: Secondary | ICD-10-CM | POA: Diagnosis not present

## 2022-06-04 DIAGNOSIS — F411 Generalized anxiety disorder: Secondary | ICD-10-CM | POA: Diagnosis not present

## 2022-06-04 DIAGNOSIS — E785 Hyperlipidemia, unspecified: Secondary | ICD-10-CM | POA: Diagnosis not present

## 2022-06-04 MED ORDER — EMPAGLIFLOZIN 10 MG PO TABS: 10.0000 mg | ORAL_TABLET | Freq: Every day | ORAL | 1 refills | Status: DC

## 2022-06-04 NOTE — Patient Instructions (Signed)
[facility] [address] [place] [address] [phone]

## 2022-06-04 NOTE — Progress Notes (Signed)
Acute Office Visit  Subjective:  Patient ID: Hannah Mcdaniel, female    DOB: Jul 23, 1965, 57 y.o.   MRN: [id number]  Chief Complaint  Patient presents with   Medical Management of Chronic Issues    HPI Patient is in today for follow up of chronic conditions   PAD/HLD  Continues on zetia. Denies side effects. Follows with cardiology.   HTN  Reports that she is taking her BP at home.  Did not bring her log in today. Reports 117/34!  Rechecked it and it was "normal" after that.  Reports that she has migraines, so she is unsure if the headaches are related to her BP.    Diabetes  Type 2 Diabetes with CVA Glucometer:Accucheck .   High at home: >200; Low at home: 48, Taking medication(s): Jardiance,. States that she was not symptomatic at 60, cut out sweets.  Last eye exam: UTD, with Dr. Dierdre Searles ~2 months ago  Last foot exam: UTD  Last A1c:  Lab Results  Component Value Date   HGBA1C 6.4 (H) 05/07/2022   Nephropathy screen indicated?: UTD  Last flu, zoster and/or pneumovax:  Immunization History  Administered Date(s) Administered   Influenza,inj,Quad PF,6+ Mos 01/20/2017, 11/07/2020   Influenza-Unspecified 01/20/2017   Moderna Sars-Covid-2 Vaccination 06/14/2019, 07/12/2019   PNEUMOCOCCAL CONJUGATE-20 11/07/2020   Pneumococcal Polysaccharide-23 04/23/2015   Tdap 04/23/2010    ROS: Denies dizziness, LOC, polyuria, polydipsia, wt gain, foot ulcerations, numbness or tingling in extremities, shortness of breath or chest pain. Endorses unintended weight loss, but is gaining.   Follows with Cardiology, reports that they are only managing her CVA and ILR.   ROS As per HPI  Objective:  BP 134/78   Pulse 86   Temp (!) 97.5 F (36.4 C) (Temporal)   Ht 5\' 1"  (1.549 m)   Wt 139 lb (63 kg)   SpO2 97%   BMI 26.26 kg/m    Physical Exam Constitutional:      General: She is not in acute distress.    Appearance: Normal appearance. She is not ill-appearing, toxic-appearing or  diaphoretic.  Cardiovascular:     Rate and Rhythm: Normal rate and regular rhythm.     Pulses: Normal pulses.          Radial pulses are 2+ on the right side and 2+ on the left side.       Posterior tibial pulses are 2+ on the right side and 2+ on the left side.     Heart sounds: Heart sounds are distant. No murmur heard.    No gallop.  Pulmonary:     Effort: Pulmonary effort is normal. No respiratory distress.     Breath sounds: Normal breath sounds. No stridor. No wheezing, rhonchi or rales.  Musculoskeletal:     Right lower leg: No edema.     Left lower leg: No edema.  Skin:    General: Skin is warm.     Capillary Refill: Capillary refill takes less than 2 seconds.  Neurological:     General: No focal deficit present.     Mental Status: She is alert and oriented to person, place, and time. Mental status is at baseline.     Motor: No weakness.  Psychiatric:        Mood and Affect: Mood normal.        Behavior: Behavior normal.        Thought Content: Thought content normal.        Judgment: Judgment normal.  Distant sounds, may be due to ILR.     06/04/2022    9:17 AM 05/07/2022    9:48 AM 02/20/2022    2:26 PM 08/01/2021   11:00 AM  GAD 7 : Generalized Anxiety Score  Nervous, Anxious, on Edge 0 0 0 0  Control/stop worrying 0 0 0 0  Worry too much - different things 0 1 0 0  Trouble relaxing 0 1 3 3   Restless 0 1 3 3   Easily annoyed or irritable 0 0 0 0  Afraid - awful might happen 0 0 0 0  Total GAD 7 Score 0 3 6 6   Anxiety Difficulty Not difficult at all Somewhat difficult Somewhat difficult Somewhat difficult       06/04/2022    9:17 AM 05/07/2022    9:47 AM 02/20/2022    2:25 PM  Depression screen PHQ 2/9  Decreased Interest 2 2 0  Down, Depressed, Hopeless 1 1 0  PHQ - 2 Score 3 3 0  Altered sleeping 3 0 0  Tired, decreased energy 0 3 1  Change in appetite 0 0 0  Feeling bad or failure about yourself  0 0 0  Trouble concentrating 0 0 0  Moving slowly or  fidgety/restless 0 0 1  Suicidal thoughts 0 0 0  PHQ-9 Score 6 6 2   Difficult doing work/chores Somewhat difficult Somewhat difficult Somewhat difficult   Assessment & Plan:  1. Hypertension associated with diabetes (HCC) Not at goal in office today. At goal at last office visit and at goal per patient at home. Requested patient to bring in log to next visit.   2. Type 2 diabetes mellitus with albuminuria (HCC) 3. Type 2 diabetes mellitus with other specified complication, without long-term current use of insulin (HCC) Tolerating well. Refill as below. Will get A1C at next appt. Discussed with patient hypoglycemia and monitoring BG at home. Has referral placed for nephrology for establishment and recommendations.  - empagliflozin (JARDIANCE) 10 MG TABS tablet; Take 1 tablet (10 mg total) by mouth daily.  Dispense: 90 tablet; Refill: 1  4. PAD (peripheral artery disease) (HCC) Follows with Cardiology. Has follow up. Continues with zetia. Unable to afford Repatha.   5. Migraine without aura and with status migrainosus, not intractable Patient managing with OTC symptomatic therapies. Instructed her to follow up.   6. Hyperlipidemia associated with type 2 diabetes mellitus (HCC) Follows with Cardiology. Has follow up. Continues with zetia. Unable to afford Repatha.   7. Major depressive disorder, recurrent episode, moderate (HCC) 8. Anxiety, generalized Well controlled at this time. Denies SI. Patient to follow up if she would like to restart therapy.   The above assessment and management plan was discussed with the patient. The patient verbalized understanding of and has agreed to the management plan using shared-decision making. Patient is aware to call the clinic if they develop any new symptoms or if symptoms fail to improve or worsen. Patient is aware when to return to the clinic for a follow-up visit. Patient educated on when it is appropriate to go to the emergency department.    Neale Burly, DNP-FNP [facility] [address] [place], [place] [address] [phone]

## 2022-06-23 ENCOUNTER — Ambulatory Visit (INDEPENDENT_AMBULATORY_CARE_PROVIDER_SITE_OTHER): Payer: 59

## 2022-06-23 DIAGNOSIS — I639 Cerebral infarction, unspecified: Secondary | ICD-10-CM

## 2022-06-23 LAB — CUP PACEART REMOTE DEVICE CHECK
Date Time Interrogation Session: 20240517230450
Implantable Pulse Generator Implant Date: 20230508

## 2022-06-23 NOTE — Progress Notes (Signed)
Carelink Summary Report / Loop Recorder 

## 2022-07-04 ENCOUNTER — Telehealth: Payer: Self-pay

## 2022-07-04 NOTE — Telephone Encounter (Signed)
Per pharmacy Jardiance requires PA.  PA started via CMM Key: [id number]

## 2022-07-17 DIAGNOSIS — M79622 Pain in left upper arm: Secondary | ICD-10-CM | POA: Diagnosis not present

## 2022-07-17 DIAGNOSIS — S40022A Contusion of left upper arm, initial encounter: Secondary | ICD-10-CM | POA: Diagnosis not present

## 2022-07-17 DIAGNOSIS — Y929 Unspecified place or not applicable: Secondary | ICD-10-CM | POA: Diagnosis not present

## 2022-07-17 DIAGNOSIS — Y999 Unspecified external cause status: Secondary | ICD-10-CM | POA: Diagnosis not present

## 2022-07-17 DIAGNOSIS — I10 Essential (primary) hypertension: Secondary | ICD-10-CM | POA: Diagnosis not present

## 2022-07-17 DIAGNOSIS — S59902A Unspecified injury of left elbow, initial encounter: Secondary | ICD-10-CM | POA: Diagnosis not present

## 2022-07-17 DIAGNOSIS — Z8673 Personal history of transient ischemic attack (TIA), and cerebral infarction without residual deficits: Secondary | ICD-10-CM | POA: Diagnosis not present

## 2022-07-17 DIAGNOSIS — Y939 Activity, unspecified: Secondary | ICD-10-CM | POA: Diagnosis not present

## 2022-07-17 DIAGNOSIS — S46912A Strain of unspecified muscle, fascia and tendon at shoulder and upper arm level, left arm, initial encounter: Secondary | ICD-10-CM | POA: Diagnosis not present

## 2022-07-17 DIAGNOSIS — W109XXA Fall (on) (from) unspecified stairs and steps, initial encounter: Secondary | ICD-10-CM | POA: Diagnosis not present

## 2022-07-17 DIAGNOSIS — Z87891 Personal history of nicotine dependence: Secondary | ICD-10-CM | POA: Diagnosis not present

## 2022-07-17 DIAGNOSIS — E785 Hyperlipidemia, unspecified: Secondary | ICD-10-CM | POA: Diagnosis not present

## 2022-07-18 NOTE — Progress Notes (Signed)
Carelink Summary Report / Loop Recorder 

## 2022-07-21 ENCOUNTER — Other Ambulatory Visit (HOSPITAL_COMMUNITY): Payer: Self-pay

## 2022-07-28 ENCOUNTER — Ambulatory Visit (INDEPENDENT_AMBULATORY_CARE_PROVIDER_SITE_OTHER): Payer: 59

## 2022-07-28 DIAGNOSIS — R4781 Slurred speech: Secondary | ICD-10-CM | POA: Diagnosis not present

## 2022-07-28 DIAGNOSIS — E876 Hypokalemia: Secondary | ICD-10-CM | POA: Diagnosis not present

## 2022-07-28 DIAGNOSIS — I639 Cerebral infarction, unspecified: Secondary | ICD-10-CM | POA: Diagnosis not present

## 2022-07-28 DIAGNOSIS — R531 Weakness: Secondary | ICD-10-CM | POA: Diagnosis not present

## 2022-07-28 DIAGNOSIS — Z720 Tobacco use: Secondary | ICD-10-CM | POA: Diagnosis not present

## 2022-07-28 DIAGNOSIS — R4182 Altered mental status, unspecified: Secondary | ICD-10-CM | POA: Diagnosis not present

## 2022-07-28 DIAGNOSIS — Z8673 Personal history of transient ischemic attack (TIA), and cerebral infarction without residual deficits: Secondary | ICD-10-CM | POA: Diagnosis not present

## 2022-07-28 DIAGNOSIS — G459 Transient cerebral ischemic attack, unspecified: Secondary | ICD-10-CM | POA: Diagnosis not present

## 2022-07-28 DIAGNOSIS — I10 Essential (primary) hypertension: Secondary | ICD-10-CM | POA: Diagnosis not present

## 2022-07-28 DIAGNOSIS — F430 Acute stress reaction: Secondary | ICD-10-CM | POA: Diagnosis not present

## 2022-07-28 DIAGNOSIS — F411 Generalized anxiety disorder: Secondary | ICD-10-CM | POA: Diagnosis not present

## 2022-07-28 LAB — CUP PACEART REMOTE DEVICE CHECK
Date Time Interrogation Session: 20240623231018
Implantable Pulse Generator Implant Date: 20230508

## 2022-07-29 ENCOUNTER — Ambulatory Visit: Payer: 59 | Admitting: Family Medicine

## 2022-07-30 ENCOUNTER — Encounter: Payer: Self-pay | Admitting: Family Medicine

## 2022-07-31 ENCOUNTER — Ambulatory Visit: Payer: 59 | Admitting: Family Medicine

## 2022-08-04 ENCOUNTER — Encounter: Payer: Self-pay | Admitting: Family Medicine

## 2022-08-05 ENCOUNTER — Ambulatory Visit: Payer: 59 | Admitting: Family Medicine

## 2022-08-06 ENCOUNTER — Encounter: Payer: Self-pay | Admitting: Family Medicine

## 2022-08-06 ENCOUNTER — Ambulatory Visit: Payer: 59 | Admitting: Family Medicine

## 2022-08-06 ENCOUNTER — Ambulatory Visit (INDEPENDENT_AMBULATORY_CARE_PROVIDER_SITE_OTHER): Payer: 59

## 2022-08-06 VITALS — BP 169/80 | HR 65 | Temp 98.0°F | Ht 61.0 in | Wt 134.0 lb

## 2022-08-06 DIAGNOSIS — M25512 Pain in left shoulder: Secondary | ICD-10-CM | POA: Diagnosis not present

## 2022-08-06 DIAGNOSIS — F331 Major depressive disorder, recurrent, moderate: Secondary | ICD-10-CM | POA: Diagnosis not present

## 2022-08-06 DIAGNOSIS — E785 Hyperlipidemia, unspecified: Secondary | ICD-10-CM | POA: Diagnosis not present

## 2022-08-06 DIAGNOSIS — E1159 Type 2 diabetes mellitus with other circulatory complications: Secondary | ICD-10-CM

## 2022-08-06 DIAGNOSIS — M19012 Primary osteoarthritis, left shoulder: Secondary | ICD-10-CM | POA: Diagnosis not present

## 2022-08-06 DIAGNOSIS — R809 Proteinuria, unspecified: Secondary | ICD-10-CM | POA: Diagnosis not present

## 2022-08-06 DIAGNOSIS — I152 Hypertension secondary to endocrine disorders: Secondary | ICD-10-CM | POA: Diagnosis not present

## 2022-08-06 DIAGNOSIS — F17200 Nicotine dependence, unspecified, uncomplicated: Secondary | ICD-10-CM

## 2022-08-06 DIAGNOSIS — E1169 Type 2 diabetes mellitus with other specified complication: Secondary | ICD-10-CM | POA: Diagnosis not present

## 2022-08-06 DIAGNOSIS — E1129 Type 2 diabetes mellitus with other diabetic kidney complication: Secondary | ICD-10-CM | POA: Diagnosis not present

## 2022-08-06 DIAGNOSIS — F17210 Nicotine dependence, cigarettes, uncomplicated: Secondary | ICD-10-CM | POA: Diagnosis not present

## 2022-08-06 DIAGNOSIS — Z7984 Long term (current) use of oral hypoglycemic drugs: Secondary | ICD-10-CM

## 2022-08-06 DIAGNOSIS — F411 Generalized anxiety disorder: Secondary | ICD-10-CM | POA: Diagnosis not present

## 2022-08-06 DIAGNOSIS — I700 Atherosclerosis of aorta: Secondary | ICD-10-CM | POA: Diagnosis not present

## 2022-08-06 LAB — CMP14+EGFR
ALT: 13 IU/L (ref 0–32)
AST: 20 IU/L (ref 0–40)
Albumin: 4.5 g/dL (ref 3.8–4.9)
Alkaline Phosphatase: 132 IU/L — ABNORMAL HIGH (ref 44–121)
BUN/Creatinine Ratio: 18 (ref 9–23)
BUN: 17 mg/dL (ref 6–24)
Bilirubin Total: 0.5 mg/dL (ref 0.0–1.2)
CO2: 21 mmol/L (ref 20–29)
Calcium: 9.8 mg/dL (ref 8.7–10.2)
Chloride: 101 mmol/L (ref 96–106)
Creatinine, Ser: 0.97 mg/dL (ref 0.57–1.00)
Globulin, Total: 2.5 g/dL (ref 1.5–4.5)
Glucose: 104 mg/dL — ABNORMAL HIGH (ref 70–99)
Potassium: 4.3 mmol/L (ref 3.5–5.2)
Sodium: 139 mmol/L (ref 134–144)
Total Protein: 7 g/dL (ref 6.0–8.5)
eGFR: 68 mL/min/{1.73_m2} (ref >59)

## 2022-08-06 LAB — CBC WITH DIFFERENTIAL/PLATELET
Basophils Absolute: 0 10*3/uL (ref 0.0–0.2)
Basos: 1 %
EOS (ABSOLUTE): 0.1 10*3/uL (ref 0.0–0.4)
Eos: 1 %
Hematocrit: 45.1 % (ref 34.0–46.6)
Hemoglobin: 15.7 g/dL (ref 11.1–15.9)
Immature Grans (Abs): 0 10*3/uL (ref 0.0–0.1)
Immature Granulocytes: 0 %
Lymphocytes Absolute: 3.2 10*3/uL — ABNORMAL HIGH (ref 0.7–3.1)
Lymphs: 37 %
MCH: 31.5 pg (ref 26.6–33.0)
MCHC: 34.8 g/dL (ref 31.5–35.7)
MCV: 91 fL (ref 79–97)
Monocytes Absolute: 0.4 10*3/uL (ref 0.1–0.9)
Monocytes: 5 %
Neutrophils Absolute: 4.9 10*3/uL (ref 1.4–7.0)
Neutrophils: 56 %
Platelets: 282 10*3/uL (ref 150–450)
RBC: 4.98 x10E6/uL (ref 3.77–5.28)
RDW: 12.8 % (ref 11.7–15.4)
WBC: 8.6 10*3/uL (ref 3.4–10.8)

## 2022-08-06 LAB — LIPID PANEL
Chol/HDL Ratio: 6.6 ratio — ABNORMAL HIGH (ref 0.0–4.4)
Cholesterol, Total: 317 mg/dL — ABNORMAL HIGH (ref 100–199)
HDL: 48 mg/dL (ref >39)
LDL Chol Calc (NIH): 221 mg/dL — ABNORMAL HIGH (ref 0–99)
Triglycerides: 241 mg/dL — ABNORMAL HIGH (ref 0–149)
VLDL Cholesterol Cal: 48 mg/dL — ABNORMAL HIGH (ref 5–40)

## 2022-08-06 LAB — BAYER DCA HB A1C WAIVED: HB A1C (BAYER DCA - WAIVED): 6 % — ABNORMAL HIGH (ref 4.8–5.6)

## 2022-08-06 MED ORDER — EMPAGLIFLOZIN 10 MG PO TABS
10.0000 mg | ORAL_TABLET | Freq: Every day | ORAL | 1 refills | Status: DC
Start: 2022-08-06 — End: 2023-02-02

## 2022-08-06 MED ORDER — PREDNISONE 20 MG PO TABS
40.0000 mg | ORAL_TABLET | Freq: Every day | ORAL | 0 refills | Status: AC
Start: 2022-08-06 — End: 2022-08-11

## 2022-08-06 MED ORDER — HYDROXYZINE PAMOATE 25 MG PO CAPS
25.0000 mg | ORAL_CAPSULE | Freq: Three times a day (TID) | ORAL | 0 refills | Status: DC | PRN
Start: 2022-08-06 — End: 2022-09-03

## 2022-08-06 NOTE — Progress Notes (Signed)
Acute Office Visit  Subjective:  Patient ID: Hannah Mcdaniel, female    DOB: Apr 01, 1965, 57 y.o.   MRN: [id number]  Chief Complaint  Patient presents with   Medical Management of Chronic Issues   HPI Patient is in today for follow up of chronic conditions, worsening depression and anxiety, and pain in left shoulder.   HTN Blood pressure monitor - patient uses and upper arm cuff at home  BP at home average states 120/76  ROS Denies peripheral edema, changes to vision, chest pain, headaches, palpitations, sweats, SOB, PND, orthopnea, neck pain  Meds hydrochlorothiazide, losartan CAD risks: CVA history, HTN, HLD, T2DM  Type 2 Diabetes with Hypertension and Hyperlipidemia  Glucometer: Has not been checking BG this last week.   High at home: 59; Low at home: 41, Taking medication(s): Jardiance  States that she was not symptomatic. States that she is not having lows frequently.  Last eye exam: requested  Last foot exam: due - completed today Last A1c:  Lab Results  Component Value Date   HGBA1C 6.4 (H) 05/07/2022   Nephropathy screen indicated?: has follow up with nephrology 23rd of July       Component Ref Range & Units 6 mo ago 2 yr ago 3 yr ago  Creatinine, Urine Not Estab. mg/dL 409.8 119.1 478.2  Microalbumin, Urine Not Estab. ug/mL 38.0 8.9 7.4  Microalb/Creat Ratio 0 - 29 mg/g creat 38 High  5 CM 5 CM  Comment:                        Normal:                0 -  29                        Moderately increased: 30 - 300                        Severely increased:       >300    02/04/22 Last flu, zoster and/or pneumovax:  Immunization History  Administered Date(s) Administered   Influenza,inj,Quad PF,6+ Mos 01/20/2017, 11/07/2020   Influenza-Unspecified 01/20/2017   Moderna Sars-Covid-2 Vaccination 06/14/2019, 07/12/2019   PNEUMOCOCCAL CONJUGATE-20 11/07/2020   Pneumococcal Polysaccharide-23 04/23/2015   Tdap 04/23/2010    ROS: Endorses dizziness, Denies: LOC,  polyuria, polydipsia, unintended weight loss/gain, foot ulcerations, numbness or tingling in extremities, shortness of breath or chest pain.  Depression/Anxiety  She recently lost her mother in law, whom she was the primary caregiver of for the last 8 years. She states that she has to "break her habit" of constantly checking on her mother in law. Her family took her to the hospital for "her nerves" and she was given Xanax. She has not been taking them because she does not want to be on medications.  She is planning to go to [place] this weekend with her Daughter and grandchildren. Symptoms include lack of motivation, appetite, crying all the time. Reports that she cannot sleep and has constant worry. States that it has caused her to be dizzy and raise her BP.   Left shoulder Pain  Started one month ago. Has bursitis in right shoulder. States that she is not able to lift her arm over her head due to the pain. She has tried tylenol for the pain and it has not helped. Denies injury or fall.   ROS As per  HPI   Objective:  BP (!) 169/80   Pulse 65   Temp 98 F (36.7 C)   Ht 5\' 1"  (1.549 m)   Wt 134 lb (60.8 kg)   SpO2 100%   BMI 25.32 kg/m     08/06/2022   10:34 AM 08/06/2022   10:32 AM 06/04/2022    9:05 AM  Vitals with BMI  Height  5\' 1"  5\' 1"   Weight  134 lbs 139 lbs  BMI  25.33 26.28  Systolic 169 176 161  Diastolic 80 82 78  Pulse  65 86     Physical Exam Constitutional:      General: She is awake. She is not in acute distress.    Appearance: She is well-groomed and overweight. She is ill-appearing. She is not toxic-appearing or diaphoretic.     Comments: Chronically ill-appearing   HENT:     Head:     Comments: Wears glasses  Cardiovascular:     Rate and Rhythm: Normal rate and regular rhythm.     Pulses: Normal pulses.          Radial pulses are 2+ on the right side and 2+ on the left side.       Posterior tibial pulses are 2+ on the right side and 2+ on the left side.      Heart sounds: Heart sounds are distant. No murmur heard.    No gallop.     Comments: Implanted loop recorder Pulmonary:     Effort: Pulmonary effort is normal. No respiratory distress.     Breath sounds: Decreased air movement present. No stridor. Decreased breath sounds present. No wheezing, rhonchi or rales.  Abdominal:     Palpations: Abdomen is soft.  Musculoskeletal:     Cervical back: Full passive range of motion without pain and neck supple.     Right lower leg: No edema.     Left lower leg: No edema.     Comments: Walking with cane,  Left sided weakness   Skin:    General: Skin is warm.     Capillary Refill: Capillary refill takes less than 2 seconds.     Coloration: Skin is pale.     Findings: Bruising present.  Neurological:     General: No focal deficit present.     Mental Status: She is alert, oriented to person, place, and time and easily aroused. Mental status is at baseline.     GCS: GCS eye subscore is 4. GCS verbal subscore is 5. GCS motor subscore is 6.     Motor: No weakness.  Psychiatric:        Attention and Perception: Perception normal. She is inattentive.        Mood and Affect: Mood is anxious and depressed. Affect is flat and tearful.        Speech: Speech normal.        Behavior: Behavior is withdrawn. Behavior is cooperative.        Thought Content: Thought content normal. Thought content does not include homicidal or suicidal ideation. Thought content does not include homicidal or suicidal plan.        Cognition and Memory: Cognition and memory normal.        Judgment: Judgment normal.       08/06/2022   10:37 AM 06/04/2022    9:17 AM 05/07/2022    9:47 AM  Depression screen PHQ 2/9  Decreased Interest 3 2 2   Down, Depressed, Hopeless 3 1  1  PHQ - 2 Score 6 3 3   Altered sleeping 2 3 0  Tired, decreased energy 3 0 3  Change in appetite 2 0 0  Feeling bad or failure about yourself  3 0 0  Trouble concentrating 2 0 0  Moving slowly or  fidgety/restless 0 0 0  Suicidal thoughts 0 0 0  PHQ-9 Score 18 6 6   Difficult doing work/chores Very difficult Somewhat difficult Somewhat difficult      08/06/2022   10:38 AM 06/04/2022    9:17 AM 05/07/2022    9:48 AM 02/20/2022    2:26 PM  GAD 7 : Generalized Anxiety Score  Nervous, Anxious, on Edge 3 0 0 0  Control/stop worrying 2 0 0 0  Worry too much - different things 1 0 1 0  Trouble relaxing 3 0 1 3  Restless 2 0 1 3  Easily annoyed or irritable 3 0 0 0  Afraid - awful might happen 0 0 0 0  Total GAD 7 Score 14 0 3 6  Anxiety Difficulty Very difficult Not difficult at all Somewhat difficult Somewhat difficult    Assessment & Plan:  1. Hypertension associated with diabetes (HCC) Not at goal in office. Instructed patient to monitor BP at home and schedule follow up with Triage RN to check BP and calibrate home monitor in 2 weeks. Patient has access to monitor at home.  - CBC with Differential/Platelet  2. Type 2 diabetes mellitus with other specified complication, without long-term current use of insulin (HCC) Reviewed A1C. Remains at goal. Completed foot exam. Refills as below. Labs as below. Will communicate results to patient once available.  - Bayer DCA Hb A1c Waived - CMP14+EGFR - empagliflozin (JARDIANCE) 10 MG TABS tablet; Take 1 tablet (10 mg total) by mouth daily.  Dispense: 90 tablet; Refill: 1  3. Hyperlipidemia associated with type 2 diabetes mellitus (HCC) Labs as below. Will communicate results to patient once available.  Not fasting  Patient is tolerating statin, continue current regimen until Lipid panel results.  - Lipid panel  4. Major depressive disorder, recurrent episode, moderate (HCC) Medication as below. Discussed with patient the risk of taking hydroxyzine with benzo. She does not wish to take benzodiazepines, offered her alternative, which she was interested in. Safety contract established. Denies SI. Patient amenable to meeting with Arlys John when she  returns from vacation. Patient and/or legal guardian verbally consented to [facility] services about presenting concerns and psychiatric consultation as appropriate.  The services will be billed as appropriate for the patient. - hydrOXYzine (VISTARIL) 25 MG capsule; Take 1 capsule (25 mg total) by mouth every 8 (eight) hours as needed.  Dispense: 90 capsule; Refill: 0 - Ambulatory referral to Integrated Behavioral Health  5. Anxiety, generalized As above.  - hydrOXYzine (VISTARIL) 25 MG capsule; Take 1 capsule (25 mg total) by mouth every 8 (eight) hours as needed.  Dispense: 90 capsule; Refill: 0  6. Smoker Patient continues to smoke. Will follow up at chronic condition follow up to pursue lung cancer screening.    7. Type 2 diabetes mellitus with albuminuria (HCC) Well controlled A1C. Patient is on GDMT taking Losartan and SGLT2I. Referral previously placed with nephrology and patient has upcoming appt. Appreciate recommendations.  - empagliflozin (JARDIANCE) 10 MG TABS tablet; Take 1 tablet (10 mg total) by mouth daily.  Dispense: 90 tablet; Refill: 1  8. Acute pain of left shoulder Will start with conservative management as below as patient has  renal impairment and is well controlled with A1C. Imaging as below. Will await results and determine next steps.  - predniSONE (DELTASONE) 20 MG tablet; Take 2 tablets (40 mg total) by mouth daily with breakfast for 5 days.  Dispense: 10 tablet; Refill: 0 - DG Shoulder Left; Future  The above assessment and management plan was discussed with the patient. The patient verbalized understanding of and has agreed to the management plan using shared-decision making. Patient is aware to call the clinic if they develop any new symptoms or if symptoms fail to improve or worsen. Patient is aware when to return to the clinic for a follow-up visit. Patient educated on when it is appropriate to go to the emergency department.   Return in  about 6 weeks (around 09/17/2022) for Chronic Condition Follow up.  Neale Burly, DNP-FNP [facility] [address] [place], [place] [address] [phone]

## 2022-08-08 ENCOUNTER — Telehealth: Payer: Self-pay | Admitting: Family Medicine

## 2022-08-08 NOTE — Telephone Encounter (Signed)
Called pt back and let her know the current status of results and advised her wed call as soon has we have them back

## 2022-08-08 NOTE — Progress Notes (Signed)
A1C remains in goal. CBC with slight increase in lymphocytes, some variation in labs are expected and do not indicate pathological finding. Slightly elevated Alk phos, can be due to non fasting status. Will repeat and if remains elevated will complete isoenzymes. Cholesterol remains elevated. Patient was previously on Repatha, but was stopped due to cost/insurance coverage. Will reach out to [facility] to see if there is a way we can restart this.

## 2022-08-12 NOTE — Progress Notes (Signed)
Osteoarthritis seen on xray. Patient can continue current treatment plan. If she would like a referral to physical therapy, I can place one.

## 2022-08-13 ENCOUNTER — Encounter: Payer: Self-pay | Admitting: Pharmacist

## 2022-08-15 ENCOUNTER — Other Ambulatory Visit: Payer: Self-pay | Admitting: Family Medicine

## 2022-08-15 DIAGNOSIS — E559 Vitamin D deficiency, unspecified: Secondary | ICD-10-CM

## 2022-08-15 NOTE — Progress Notes (Signed)
Carelink Summary Report / Loop Recorder 

## 2022-08-15 NOTE — Progress Notes (Signed)
No retinopathy. Repeat in one year.

## 2022-08-18 ENCOUNTER — Ambulatory Visit: Payer: 59

## 2022-08-18 VITALS — BP 127/69 | HR 79

## 2022-08-18 DIAGNOSIS — Z0130 Encounter for examination of blood pressure without abnormal findings: Secondary | ICD-10-CM

## 2022-08-18 NOTE — Progress Notes (Signed)
Patient here today for blood pressure check.  Blood pressure was 127/69, pulse 79.  Patient did not bring her monitor from home.

## 2022-08-22 ENCOUNTER — Ambulatory Visit: Payer: 59

## 2022-08-28 ENCOUNTER — Other Ambulatory Visit: Payer: Self-pay | Admitting: Family Medicine

## 2022-08-28 DIAGNOSIS — E1122 Type 2 diabetes mellitus with diabetic chronic kidney disease: Secondary | ICD-10-CM | POA: Diagnosis not present

## 2022-08-28 DIAGNOSIS — F32A Depression, unspecified: Secondary | ICD-10-CM | POA: Diagnosis not present

## 2022-08-28 DIAGNOSIS — N1831 Chronic kidney disease, stage 3a: Secondary | ICD-10-CM | POA: Diagnosis not present

## 2022-08-28 DIAGNOSIS — E785 Hyperlipidemia, unspecified: Secondary | ICD-10-CM | POA: Diagnosis not present

## 2022-08-28 DIAGNOSIS — R809 Proteinuria, unspecified: Secondary | ICD-10-CM | POA: Diagnosis not present

## 2022-08-28 DIAGNOSIS — F419 Anxiety disorder, unspecified: Secondary | ICD-10-CM | POA: Diagnosis not present

## 2022-08-28 DIAGNOSIS — F411 Generalized anxiety disorder: Secondary | ICD-10-CM

## 2022-08-28 DIAGNOSIS — F331 Major depressive disorder, recurrent, moderate: Secondary | ICD-10-CM

## 2022-09-01 ENCOUNTER — Ambulatory Visit (INDEPENDENT_AMBULATORY_CARE_PROVIDER_SITE_OTHER): Payer: 59

## 2022-09-01 DIAGNOSIS — I639 Cerebral infarction, unspecified: Secondary | ICD-10-CM | POA: Diagnosis not present

## 2022-09-10 ENCOUNTER — Telehealth: Payer: Self-pay | Admitting: Pharmacist

## 2022-09-10 DIAGNOSIS — E1169 Type 2 diabetes mellitus with other specified complication: Secondary | ICD-10-CM

## 2022-09-10 NOTE — Telephone Encounter (Signed)
    09/10/2022 Name: Hannah Mcdaniel MRN: [id number] DOB: Jan 31, 1966   A1C remains at goal. CBC with slight increase in lymphocytes, some variation in labs are expected and do not indicate pathological finding. Slightly elevated Alk phos, can be due to non fasting status. Will repeat and if remains elevated will complete isoenzymes. Cholesterol remains elevated. Patient was previously on Repatha, but was stopped due to cost/insurance coverage. Will reach out to [place] to see if there is a way we can restart this.    Lipid Panel     Component Value Date/Time   CHOL 317 (H) 08/06/2022 1027   TRIG 241 (H) 08/06/2022 1027   HDL 48 08/06/2022 1027   CHOLHDL 6.6 (H) 08/06/2022 1027   CHOLHDL 2.1 04/11/2021 1835   VLDL 22 04/11/2021 1835   LDLCALC 221 (H) 08/06/2022 1027    Appreciate [id number] placed to pharmacy    Kieth Brightly, PharmD, BCACP Clinical Pharmacist, [facility]

## 2022-09-16 ENCOUNTER — Ambulatory Visit (INDEPENDENT_AMBULATORY_CARE_PROVIDER_SITE_OTHER): Payer: 59 | Admitting: Pharmacist

## 2022-09-16 ENCOUNTER — Telehealth: Payer: Self-pay | Admitting: Pharmacist

## 2022-09-16 DIAGNOSIS — G720 Drug-induced myopathy: Secondary | ICD-10-CM | POA: Diagnosis not present

## 2022-09-16 DIAGNOSIS — E1169 Type 2 diabetes mellitus with other specified complication: Secondary | ICD-10-CM | POA: Diagnosis not present

## 2022-09-16 DIAGNOSIS — E785 Hyperlipidemia, unspecified: Secondary | ICD-10-CM

## 2022-09-16 DIAGNOSIS — T466X5D Adverse effect of antihyperlipidemic and antiarteriosclerotic drugs, subsequent encounter: Secondary | ICD-10-CM | POA: Diagnosis not present

## 2022-09-16 MED ORDER — PRALUENT 150 MG/ML ~~LOC~~ SOAJ
150.0000 mg | SUBCUTANEOUS | 2 refills | Status: DC
Start: 2022-09-16 — End: 2023-04-17

## 2022-09-16 MED ORDER — REPATHA SURECLICK 140 MG/ML ~~LOC~~ SOAJ
140.0000 mg | SUBCUTANEOUS | 2 refills | Status: DC
Start: 2022-09-16 — End: 2022-09-16

## 2022-09-16 NOTE — Progress Notes (Unsigned)
09/16/2022 Name: Hannah Mcdaniel MRN: [id number] DOB: February 27, 1965  Chief Complaint  Patient presents with   Hyperlipidemia    Hannah Mcdaniel is a 57 y.o. year old female who was referred for medication management by their primary care provider, Ellamae Sia, Aleen Campi, FNP. They presented for a face to face visit today.   They were referred to the pharmacist by their PCP for assistance in managing hyperlipidemia. Patient reports she was taking a PCSK9 (Praluent) in the past and tolerated, but insurance remains the barrier to her PSCK9 therapy at this time.  Her LDL decreased to LDL 55 in Fall 2023 on PCSK9 therapy.  She has tried at failed multiple statins as well as bempedoic acid (reported severe myopathy with both).  Her LDL has increased to 221 (goal <70) despite ezetimibe monotherapy therapy and lifestyle management.  She reports myopathy with rosuvastatin, bempedoic acid. Patient was previously on Praluent, but was stopped due to cost/insurance coverage.  Subjective:  Care Team: Primary Care Provider: Arrie Senate, FNP   Medication Access/Adherence  [facility]acy:  [phone] [place] [place] - [address] [place] [address] Phone: [phone] Fax: [phone]  Patient reports affordability concerns with their medications: Yes  Patient reports access/transportation concerns to their pharmacy: No  Patient reports adherence concerns with their medications:  Yes  insurance barriers  Hyperlipidemia/ASCVD Risk Reduction  Current lipid lowering medications: ezetimibe Medications tried in the past: rosuvastatin  Antiplatelet regimen: clopidogrel (was on ASA in the past, reports allergy)  ASCVD History: cryptogenic stroke, T2DM, HTN Family History: mom stroke Risk Factors: former smoker  Current physical activity: encouraged   Current medication access support: [employer] [facility]  Objective:  Lab Results  Component  Value Date   HGBA1C 6.0 (H) 08/06/2022    Lab Results  Component Value Date   CREATININE 0.97 08/06/2022   BUN 17 08/06/2022   NA 139 08/06/2022   K 4.3 08/06/2022   CL 101 08/06/2022   CO2 21 08/06/2022    Lab Results  Component Value Date   CHOL 317 (H) 08/06/2022   HDL 48 08/06/2022   LDLCALC 221 (H) 08/06/2022   TRIG 241 (H) 08/06/2022   CHOLHDL 6.6 (H) 08/06/2022    Medications Reviewed Today     Reviewed by Danella Maiers, [facility] (Pharmacist) on 09/16/22 at [phone]  Med List Status: <None>   Medication Order Taking? Sig Documenting Provider Last Dose Status Informant  Alirocumab (PRALUENT) 150 MG/ML SOAJ [id number] Yes Inject 1 mL (150 mg total) into the skin every 14 (fourteen) days. Arrie Senate, FNP  Active   Discontinued 09/16/22 [phone] (Completed Course)   Discontinued 09/16/22 0850 (Change in therapy) Discontinued 09/16/22 0850 (Duplicate)   clopidogrel (PLAVIX) 75 MG tablet [id number] No Take 1 tablet (75 mg total) by mouth daily. Chuck Hint, MD Taking Active   empagliflozin (JARDIANCE) 10 MG TABS tablet [id number]  Take 1 tablet (10 mg total) by mouth daily. Arrie Senate, FNP  Active   ezetimibe (ZETIA) 10 MG tablet [id number] No Take 1 tablet (10 mg total) by mouth daily. Gwenlyn Fudge, FNP Taking Active   hydrochlorothiazide (HYDRODIURIL) 25 MG tablet [id number] No Take 1 tablet (25 mg total) by mouth daily. Sonny Masters, FNP Taking Active   hydrOXYzine (VISTARIL) 25 MG capsule [id number]  TAKE 1 CAPSULE (25 MG TOTAL) BY MOUTH EVERY 8 (EIGHT) HOURS AS NEEDED. Arrie Senate, FNP  Active   levocetirizine (  XYZAL) 5 MG tablet [id number] No Take 1 tablet (5 mg total) by mouth every evening. Gwenlyn Fudge, FNP Taking Active   losartan (COZAAR) 50 MG tablet [id number] No TAKE 2 TABLETS BY MOUTH EVERY DAY Rakes, Doralee Albino, FNP Taking Active   mirtazapine (REMERON) 7.5 MG tablet [id number] No TAKE 1 TABLET BY MOUTH AT BEDTIME. Sonny Masters, FNP Taking Active   montelukast (SINGULAIR) 10 MG tablet [id number] No Take 1 tablet (10 mg total) by mouth at bedtime. Gwenlyn Fudge, FNP Taking Active   rOPINIRole (REQUIP) 0.25 MG tablet [id number] No TAKE 1-2 TABLETS (0.25-0.5 MG TOTAL) BY MOUTH AT BEDTIME. Sonny Masters, FNP Taking Active             Assessment/Plan:   Hyperlipidemia/ASCVD Risk Reduction: - Currently uncontrolled.  - Reviewed long term complications of uncontrolled cholesterol - Reviewed dietary recommendations including FOLLOWING A HEART HEALTHY DIET/HEALTHY PLATE METHOD - Reviewed lifestyle recommendations including: increasing physical activity up to 150min/week - Recommend to  START Praluent (PCSK9 preferred on insurance) Continue ezetimibe, but will likely stop at f/u once controlled on Praluent -Assisted patient with Praluent copay card retrieval [employer] [facility] --medication approved until 02/03/2023     30 min of patient care was provided to the patient during this visit time including direct patient care & charting  Follow Up Plan: 1-2 months; PCP on 09/18/22  Kieth Brightly, PharmD, BCACP Clinical Pharmacist, [facility]

## 2022-09-17 ENCOUNTER — Other Ambulatory Visit: Payer: Self-pay | Admitting: Family Medicine

## 2022-09-17 DIAGNOSIS — E559 Vitamin D deficiency, unspecified: Secondary | ICD-10-CM

## 2022-09-17 NOTE — Progress Notes (Signed)
Carelink Summary Report / Loop Recorder 

## 2022-09-18 ENCOUNTER — Other Ambulatory Visit: Payer: Self-pay | Admitting: Family Medicine

## 2022-09-18 ENCOUNTER — Ambulatory Visit (INDEPENDENT_AMBULATORY_CARE_PROVIDER_SITE_OTHER): Payer: 59 | Admitting: Family Medicine

## 2022-09-18 ENCOUNTER — Encounter: Payer: Self-pay | Admitting: Family Medicine

## 2022-09-18 VITALS — BP 185/92 | HR 70 | Temp 98.1°F | Ht 61.0 in | Wt 135.0 lb

## 2022-09-18 DIAGNOSIS — Z7984 Long term (current) use of oral hypoglycemic drugs: Secondary | ICD-10-CM

## 2022-09-18 DIAGNOSIS — M79622 Pain in left upper arm: Secondary | ICD-10-CM | POA: Diagnosis not present

## 2022-09-18 DIAGNOSIS — F411 Generalized anxiety disorder: Secondary | ICD-10-CM | POA: Diagnosis not present

## 2022-09-18 DIAGNOSIS — I152 Hypertension secondary to endocrine disorders: Secondary | ICD-10-CM

## 2022-09-18 DIAGNOSIS — E1159 Type 2 diabetes mellitus with other circulatory complications: Secondary | ICD-10-CM

## 2022-09-18 DIAGNOSIS — E1169 Type 2 diabetes mellitus with other specified complication: Secondary | ICD-10-CM

## 2022-09-18 DIAGNOSIS — Z87891 Personal history of nicotine dependence: Secondary | ICD-10-CM

## 2022-09-18 DIAGNOSIS — I10 Essential (primary) hypertension: Secondary | ICD-10-CM

## 2022-09-18 DIAGNOSIS — F331 Major depressive disorder, recurrent, moderate: Secondary | ICD-10-CM | POA: Diagnosis not present

## 2022-09-18 DIAGNOSIS — E785 Hyperlipidemia, unspecified: Secondary | ICD-10-CM | POA: Diagnosis not present

## 2022-09-18 DIAGNOSIS — E559 Vitamin D deficiency, unspecified: Secondary | ICD-10-CM

## 2022-09-18 DIAGNOSIS — G479 Sleep disorder, unspecified: Secondary | ICD-10-CM | POA: Diagnosis not present

## 2022-09-18 MED ORDER — HYDROXYZINE PAMOATE 25 MG PO CAPS
25.0000 mg | ORAL_CAPSULE | Freq: Four times a day (QID) | ORAL | 1 refills | Status: AC | PRN
Start: 2022-09-18 — End: ?

## 2022-09-18 MED ORDER — DICLOFENAC SODIUM 1 % EX GEL
4.0000 g | Freq: Four times a day (QID) | CUTANEOUS | 2 refills | Status: AC
Start: 2022-09-18 — End: ?

## 2022-09-18 NOTE — Telephone Encounter (Signed)
Attempted PA for Praluent;  called insurance to confirm Patient notified of approval [facility] [place]  filled VM left with patient notifying them of approval and med is ready to pick up

## 2022-09-18 NOTE — Progress Notes (Signed)
Acute Office Visit  Subjective:  Patient ID: Hannah Mcdaniel, female    DOB: [birth year], 57 y.o.   MRN: [id number]  Chief Complaint  Patient presents with   Medical Management of Chronic Issues   Hypertension   Patient is in today for chronic condition follow   Hypertension Has BP monitor at home Yes BP at home average 129/36 states that repeat was 120s/60s  ROS Denies  fatigue, peripheral edema, changes to vision, chest pain, headaches, palpitations, sweats, SOB, PND, orthopnea Meds hydrochlorothiazide, Losartan   CAD risks smoker, Diabetes Mellitus, hypertension Reports that she did not take her medications today and was rushing out of her house to get here.   Type 2 Diabetes with HTN, HLD, previous Cryptogenic Stroke  Glucometer:unknown brand .   High at home: 97; Low at home: 87, Taking medication(s): Jardiance Denies symptoms of hypoglycemia  Last eye exam: UTD, April or May of this year  Last foot exam: completed today Last A1c:  Lab Results  Component Value Date   HGBA1C 6.0 (H) 08/06/2022   Nephropathy screen indicated?: completed  Last flu, zoster and/or pneumovax:  Immunization History  Administered Date(s) Administered   Influenza,inj,Quad PF,6+ Mos 01/20/2017, 11/07/2020   Influenza-Unspecified 01/20/2017   Moderna Sars-Covid-2 Vaccination 06/14/2019, 07/12/2019   PNEUMOCOCCAL CONJUGATE-20 11/07/2020   Pneumococcal Polysaccharide-23 04/23/2015   Tdap 04/23/2010   ROS: Denies dizziness, LOC, polyuria, polydipsia, unintended weight loss/gain, foot ulcerations, numbness or tingling in extremities, shortness of breath or chest pain.  Hyperlipidemia: Patient presents with hyperlipidemia. There is a family history of hyperlipidemia. There is not a family history of early ischemia heart disease.  Anxiety/Depression  States that she is doing well. Declines counseling. States that she "cut out" the stressors in her life. States that she is sleeping well when she  eventually goes to sleep. States that it takes her 3-4 hours for her to fall asleep. Reports that hydroxyzine does not help her go to sleep. States that she takes one before bed and two during the day.   Tobacco Use  Quit 2-3 months ago. States that she quit cold [place] "just decided it was time"   Left arm pain Arm pain continues. States that it has been going on for 2 months. Has not done PT. States that  She cannot lay on that side. Reports that prednisone really helped her right arm.   Has not tried voltaren gel   ROS As per HPI   Objective:  BP (!) 185/92   Pulse 70   Temp 98.1 F (36.7 C)   Ht 5\' 1"  (1.549 m)   Wt 135 lb (61.2 kg)   SpO2 95%   BMI 25.51 kg/m   Physical Exam Constitutional:      General: She is awake. She is not in acute distress.    Appearance: Normal appearance. She is well-developed, well-groomed and overweight. She is not ill-appearing, toxic-appearing or diaphoretic.  Eyes:     Comments: Wears glasses   Cardiovascular:     Rate and Rhythm: Normal rate and regular rhythm.     Pulses: Normal pulses.          Radial pulses are 2+ on the right side and 2+ on the left side.       Posterior tibial pulses are 2+ on the right side and 2+ on the left side.     Heart sounds: Normal heart sounds. No murmur heard.    No gallop.  Pulmonary:     Effort:  Pulmonary effort is normal. No respiratory distress.     Breath sounds: Normal breath sounds. No stridor. No wheezing, rhonchi or rales.  Musculoskeletal:     Left shoulder: Tenderness and bony tenderness present. No swelling, effusion or crepitus. Decreased range of motion. Decreased strength.     Cervical back: Full passive range of motion without pain and neck supple.     Right lower leg: No edema.     Left lower leg: No edema.     Comments: Decreased ROM and strength due to pain.  Walks with cane on right side   Skin:    General: Skin is warm.     Capillary Refill: Capillary refill takes less than 2  seconds.  Neurological:     General: No focal deficit present.     Mental Status: She is alert, oriented to person, place, and time and easily aroused. Mental status is at baseline.     GCS: GCS eye subscore is 4. GCS verbal subscore is 5. GCS motor subscore is 6.     Motor: No weakness.  Psychiatric:        Attention and Perception: Attention and perception normal.        Mood and Affect: Mood and affect normal.        Speech: Speech normal.        Behavior: Behavior normal. Behavior is cooperative.        Thought Content: Thought content normal. Thought content does not include homicidal or suicidal ideation. Thought content does not include homicidal or suicidal plan.        Cognition and Memory: Cognition and memory normal.        Judgment: Judgment normal.       09/18/2022    8:06 AM 08/06/2022   10:37 AM 06/04/2022    9:17 AM  Depression screen PHQ 2/9  Decreased Interest 0 3 2  Down, Depressed, Hopeless 0 3 1  PHQ - 2 Score 0 6 3  Altered sleeping 2 2 3   Tired, decreased energy 2 3 0  Change in appetite 0 2 0  Feeling bad or failure about yourself  0 3 0  Trouble concentrating 0 2 0  Moving slowly or fidgety/restless 0 0 0  Suicidal thoughts 0 0 0  PHQ-9 Score 4 18 6   Difficult doing work/chores Not difficult at all Very difficult Somewhat difficult      09/18/2022    8:06 AM 08/06/2022   10:38 AM 06/04/2022    9:17 AM 05/07/2022    9:48 AM  GAD 7 : Generalized Anxiety Score  Nervous, Anxious, on Edge 0 3 0 0  Control/stop worrying 0 2 0 0  Worry too much - different things 0 1 0 1  Trouble relaxing 2 3 0 1  Restless 0 2 0 1  Easily annoyed or irritable 2 3 0 0  Afraid - awful might happen 0 0 0 0  Total GAD 7 Score 4 14 0 3  Anxiety Difficulty Not difficult at all Very difficult Not difficult at all Somewhat difficult   Assessment & Plan:  1. Hypertension associated with diabetes (HCC) Not at goal in office. Patient to send in BP measurements from home. Patient has  access to BP monitor.   2. Type 2 diabetes mellitus with other specified complication, without long-term current use of insulin (HCC) Well controlled at last A1C of 6% on 08/06/22.   3. Hyperlipidemia associated with type 2 diabetes mellitus (HCC) Not at  goal, taking Praluent and zetia as she is intolerant statins.   4. Anxiety, generalized Will increase frequency of hydroxyzine to 4 times daily as needed for anxiety and insomnia.  - hydrOXYzine (VISTARIL) 25 MG capsule; Take 1 capsule (25 mg total) by mouth every 6 (six) hours as needed.  Dispense: 270 capsule; Refill: 1  5. Major depressive disorder, recurrent episode, moderate (HCC) Well controlled. Declines counseling. Denies SI. Will increase frequency of hydroxyzine to 4 times daily as needed for anxiety and insomnia.  - hydrOXYzine (VISTARIL) 25 MG capsule; Take 1 capsule (25 mg total) by mouth every 6 (six) hours as needed.  Dispense: 270 capsule; Refill: 1  6. Difficulty sleeping As above.   7. Former smoker Praised patient for quitting. Offered patient resources, declines at this time.   8. Vitamin D deficiency Labs as below. Will communicate results to patient once available. Will await results to determine next steps.  - VITAMIN D 25 Hydroxy (Vit-D Deficiency, Fractures)  9. Left upper arm pain Medication as below with referral to PT. Patient recently had prednisone for pain and would not like to repeat at this time due to diabetes. Patient to follow up is symptoms continue with plan for referral to ortho.  - diclofenac Sodium (VOLTAREN) 1 % GEL; Apply 4 g topically 4 (four) times daily.  Dispense: 4 g; Refill: 2 - Ambulatory referral to Physical Therapy  The above assessment and management plan was discussed with the patient. The patient verbalized understanding of and has agreed to the management plan using shared-decision making. Patient is aware to call the clinic if they develop any new symptoms or if symptoms fail to  improve or worsen. Patient is aware when to return to the clinic for a follow-up visit. Patient educated on when it is appropriate to go to the emergency department.   Return in about 3 months (around 12/19/2022) for Chronic Condition Follow up.  Neale Burly, DNP-FNP [facility] [address] [place], [place] [address] [phone]

## 2022-09-19 LAB — VITAMIN D 25 HYDROXY (VIT D DEFICIENCY, FRACTURES): Vit D, 25-Hydroxy: 20.3 ng/mL — ABNORMAL LOW (ref 30.0–100.0)

## 2022-09-19 MED ORDER — VITAMIN D (ERGOCALCIFEROL) 1.25 MG (50000 UNIT) PO CAPS: 50000.0000 [IU] | ORAL_CAPSULE | ORAL | 0 refills | Status: AC

## 2022-09-19 NOTE — Addendum Note (Signed)
Addended by: Neale Burly on: 09/19/2022 08:09 AM   Modules accepted: Orders

## 2022-09-19 NOTE — Progress Notes (Signed)
Vitamin D level remains low after supplementation. I have sent in a refill of the weekly supplement to take for the next 12 weeks. After that, take a daily OTC vitamin D supplement with 1000-2000 IU.

## 2022-09-23 NOTE — Telephone Encounter (Signed)
I sent in a refill on 09/19/22 once I got her lab results

## 2022-09-29 ENCOUNTER — Ambulatory Visit: Payer: 59 | Attending: Family Medicine | Admitting: Physical Therapy

## 2022-09-29 ENCOUNTER — Other Ambulatory Visit: Payer: Self-pay

## 2022-09-29 DIAGNOSIS — M25512 Pain in left shoulder: Secondary | ICD-10-CM | POA: Insufficient documentation

## 2022-09-29 DIAGNOSIS — M6281 Muscle weakness (generalized): Secondary | ICD-10-CM | POA: Insufficient documentation

## 2022-09-29 DIAGNOSIS — M79622 Pain in left upper arm: Secondary | ICD-10-CM | POA: Insufficient documentation

## 2022-09-29 DIAGNOSIS — M25612 Stiffness of left shoulder, not elsewhere classified: Secondary | ICD-10-CM | POA: Diagnosis not present

## 2022-09-29 NOTE — Therapy (Signed)
OUTPATIENT PHYSICAL THERAPY SHOULDER EVALUATION   Patient Name: Hannah Mcdaniel MRN: [id number] DOB:25-Aug-1965, 57 y.o., female Today's Date: 09/29/2022  END OF SESSION:  PT End of Session - 09/29/22 1125     Visit Number 1    Number of Visits 12    Date for PT Re-Evaluation 11/10/22    Authorization Type FOTO.    PT Start Time 1057    PT Stop Time 1145    PT Time Calculation (min) 48 min    Activity Tolerance Patient tolerated treatment well    Behavior During Therapy [facility] for tasks assessed/performed             Past Medical History:  Diagnosis Date   Anxiety    Arterial occlusive disease Nov. 2014   Arthritis    Claudication of lower extremity (HCC) Nov. 2014   Right Lower Extremity rest pain   Colon polyps    Depression    Diabetes mellitus without complication (HCC)    GERD (gastroesophageal reflux disease)    Hyperlipidemia    Migraines    Vertigo    Past Surgical History:  Procedure Laterality Date   ABDOMINAL AORTAGRAM N/A 01/10/2013   Procedure: ABDOMINAL AORTAGRAM;  Surgeon: Larina Earthly, MD;  Location: [facility] C[facility] LAB;  Service: Cardiovascular;  Laterality: N/A;   ABDOMINAL AORTOGRAM W/LOWER EXTREMITY Bilateral 07/16/2018   Procedure: ABDOMINAL AORTOGRAM W/LOWER EXTREMITY;  Surgeon: Chuck Hint, MD;  Location: [facility] INVASIVE CV LAB;  Service: Cardiovascular;  Laterality: Bilateral;   ABDOMINAL AORTOGRAM W/LOWER EXTREMITY N/A 10/05/2020   Procedure: ABDOMINAL AORTOGRAM W/LOWER EXTREMITY;  Surgeon: Chuck Hint, MD;  Location: [facility] INVASIVE CV LAB;  Service: Cardiovascular;  Laterality: N/A;   BUBBLE STUDY  03/26/2021   Procedure: BUBBLE STUDY;  Surgeon: Little Ishikawa, MD;  Location: [facility] ENDOSCOPY;  Service: Cardiovascular;;   CHOLECYSTECTOMY     Gall Bladder   iliac artery angioplasty and stent placement  01/10/13   LOWER EXTREMITY ANGIOGRAM Bilateral 01/10/2013   Procedure: LOWER EXTREMITY ANGIOGRAM;  Surgeon: Larina Earthly, MD;   Location: [facility] CATH LAB;  Service: Cardiovascular;  Laterality: Bilateral;   PERCUTANEOUS STENT INTERVENTION Right 01/10/2013   Procedure: PERCUTANEOUS STENT INTERVENTION;  Surgeon: Larina Earthly, MD;  Location: [facility] CATH LAB;  Service: Cardiovascular;  Laterality: Right;  rt common iliac stent   PERIPHERAL VASCULAR CATHETERIZATION N/A 11/26/2015   Procedure: Abdominal Aortogram w/Lower Extremity;  Surgeon: Chuck Hint, MD;  Location: [facility] INVASIVE CV LAB;  Service: Cardiovascular;  Laterality: N/A;   PERIPHERAL VASCULAR CATHETERIZATION Right 11/26/2015   Procedure: Peripheral Vascular Balloon Angioplasty;  Surgeon: Chuck Hint, MD;  Location: [facility] INVASIVE CV LAB;  Service: Cardiovascular;  Laterality: Right;  rt common iliac   PERIPHERAL VASCULAR INTERVENTION  07/16/2018   Procedure: PERIPHERAL VASCULAR INTERVENTION;  Surgeon: Chuck Hint, MD;  Location: [facility] INVASIVE CV LAB;  Service: Cardiovascular;;   PERIPHERAL VASCULAR INTERVENTION Right 10/05/2020   Procedure: PERIPHERAL VASCULAR INTERVENTION;  Surgeon: Chuck Hint, MD;  Location: [facility] INVASIVE CV LAB;  Service: Cardiovascular;  Laterality: Right;  Common and exteral iliac artery   TEE WITHOUT CARDIOVERSION N/A 03/26/2021   Procedure: TRANSESOPHAGEAL ECHOCARDIOGRAM (TEE);  Surgeon: Little Ishikawa, MD;  Location: [facility] ENDOSCOPY;  Service: Cardiovascular;  Laterality: N/A;   TOTAL ABDOMINAL HYSTERECTOMY     Patient Active Problem List   Diagnosis Date Noted   History of recent stroke 08/01/2021   Difficulty sleeping 08/01/2021   Decreased appetite 08/01/2021   Cryptogenic  stroke (HCC) 05/10/2021   Hypertension associated with diabetes (HCC) 04/11/2021   Seasonal allergies 04/11/2020   Smoker 05/27/2019   Hyperlipidemia associated with type 2 diabetes mellitus (HCC)    Migraine without aura and with status migrainosus, not intractable 04/29/2016   Type 2 diabetes mellitus with other specified complication  (HCC) 01/24/2016   Anxiety, generalized 07/24/2015   Major depressive disorder, recurrent episode, moderate (HCC) 05/30/2013   PAD (peripheral artery disease) (HCC) 02/09/2013    REFERRING PROVIDER: Neale Burly  REFERRING DIAG: Left upper arm pain.  THERAPY DIAG:  Acute pain of left shoulder  Stiffness of left shoulder, not elsewhere classified  Muscle weakness (generalized)  Rationale for Evaluation and Treatment: Rehabilitation  ONSET DATE: ~2 months  SUBJECTIVE:                                                                                                                                                                                      SUBJECTIVE STATEMENT:  The patient presents to the clinic today with c/o left shoulder pain that has been ongoing for about 2 months.  The patient rates her pain at a 6 today but can rise to higher levels with active movement of her left shoulder.  Resting with arm by side helps decrease pain.  She describes the pain as shooting coming from her left shoulder and going about mid way down her lateral upper arm.  She is unable to sleep on her left side due to pain.  PERTINENT HISTORY: DM.  See above.  PAIN:  Are you having pain? Yes: NPRS scale: 6/10 Pain location: Left shoulder/upper arm. Pain description: Shooting. Aggravating factors: As above. Relieving factors: As above.  PRECAUTIONS: Other: Supervise gait please.   WEIGHT BEARING RESTRICTIONS: No  FALLS:  Has patient fallen in last 6 months? Yes. Number of falls 1.  Using cane for safety.  LIVING ENVIRONMENT: Lives with: lives with their spouse Lives in: House/apartment Has following equipment at home: Single point cane   PLOF: Independent with basic ADLs and Independent with household mobility with device  PATIENT GOALS:Use left UE without pain.  NEXT MD VISIT:   OBJECTIVE:   DIAGNOSTIC FINDINGS:   08/11/22:   IMPRESSION: Very mild glenohumeral and  acromioclavicular osteoarthritis.  PATIENT SURVEYS:  FOTO .  POSTURE: Forward head and rounded shoulders.  UPPER EXTREMITY ROM:   Active left shoulder flexion limited to 110 degrees and passive full.  ER was performed slowly but through a full range of motion.  Behind back to left SIJ region.  UPPER EXTREMITY MMT:   Patient able to hold left UE against gravity.  Not able to accurately assess due  to pain.  ER strength with arm by side graded grossly at 4/5 and IR 4-/5 which appears limited by pain.  DTR's:   Normal UE DTR's.   PALPATION:  Tender to palpation over left posterior cuff musculature and middle deltoid.   TODAY'S TREATMENT:                                                                                                                                         DATE: HMP and IFC at 80-150 Hz on 40% scan x 20 minutes to patient's left shoulder.  Normal modality response following removal of modality.  PATIENT EDUCATION:   HOME EXERCISE PROGRAM:   ASSESSMENT:  CLINICAL IMPRESSION: The patient presents to OPPT with c/o left shoulder and upper arm pain over the last two months or so.  She has limited active movement which appears to be decreased at least due to pain.  She is tender to palpation over her left posterior cuff and middle deltoid musculature.  UE DTR'ar are intact.  She cannot sleep on her left side and her ability to perform ADL's is impaired.  Patient will benefit from skilled physical therapy intervention to address pain and deficits.  OBJECTIVE IMPAIRMENTS: decreased activity tolerance, decreased ROM, decreased strength, increased muscle spasms, and pain.   ACTIVITY LIMITATIONS: carrying, lifting, and reach over head  PARTICIPATION LIMITATIONS: meal prep, cleaning, and laundry  PERSONAL FACTORS: 1 comorbidity: DM  are also affecting patient's functional outcome.   REHAB POTENTIAL: Excellent  CLINICAL DECISION MAKING: Stable/uncomplicated  EVALUATION  COMPLEXITY: Low   GOALS:  LONG TERM GOALS: Target date: 11/10/22  Ind with a HEP.  Goal status: INITIAL  2.  Active left shoulder flexion to 150 degrees so the patient can easily reach overhead.  Goal status: INITIAL  3.  Increase ROM so patient is able to reach behind back to L3.  Goal status: INITIAL  4.  Perform ADL's with pain not > 3/10.  Goal status: INITIAL  5.  Increase left shoulder strength to a solid 4+/5 to increase stability for performance of functional activities.  Goal status: INITIAL   PLAN:  PT FREQUENCY: 2x/week  PT DURATION: 6 weeks  PLANNED INTERVENTIONS: Therapeutic exercises, Therapeutic activity, Neuromuscular re-education, Patient/Family education, Self Care, Dry Needling, Electrical stimulation, Vasopneumatic device, Ultrasound, and Manual therapy  PLAN FOR NEXT SESSION: Combo e'stim/US at 1.50 W/CM2, STW/M, P and AAROM to patient's left shoulder.     Natnael Biederman, [place], PT 09/29/2022, 12:18 PM

## 2022-10-01 ENCOUNTER — Ambulatory Visit (INDEPENDENT_AMBULATORY_CARE_PROVIDER_SITE_OTHER): Payer: 59

## 2022-10-01 DIAGNOSIS — I639 Cerebral infarction, unspecified: Secondary | ICD-10-CM | POA: Diagnosis not present

## 2022-10-02 LAB — CUP PACEART REMOTE DEVICE CHECK
Date Time Interrogation Session: 20240828231302
Implantable Pulse Generator Implant Date: 20230508

## 2022-10-03 ENCOUNTER — Ambulatory Visit: Payer: 59 | Admitting: *Deleted

## 2022-10-03 DIAGNOSIS — M6281 Muscle weakness (generalized): Secondary | ICD-10-CM

## 2022-10-03 DIAGNOSIS — M25612 Stiffness of left shoulder, not elsewhere classified: Secondary | ICD-10-CM | POA: Diagnosis not present

## 2022-10-03 DIAGNOSIS — M25512 Pain in left shoulder: Secondary | ICD-10-CM

## 2022-10-03 DIAGNOSIS — M79622 Pain in left upper arm: Secondary | ICD-10-CM | POA: Diagnosis not present

## 2022-10-03 NOTE — Therapy (Signed)
OUTPATIENT PHYSICAL THERAPY SHOULDER EVALUATION   Patient Name: Hannah Mcdaniel MRN: [id number] DOB:[birth year], 57 y.o., female Today's Date: 10/03/2022  END OF SESSION:  PT End of Session - 10/03/22 0934     Visit Number 2    Number of Visits 12    Date for PT Re-Evaluation 11/10/22    Authorization Type FOTO.    PT Start Time [phone]    PT Stop Time 1015    PT Time Calculation (min) 44 min             Past Medical History:  Diagnosis Date   Anxiety    Arterial occlusive disease Nov. 2014   Arthritis    Claudication of lower extremity (HCC) Nov. 2014   Right Lower Extremity rest pain   Colon polyps    Depression    Diabetes mellitus without complication (HCC)    GERD (gastroesophageal reflux disease)    Hyperlipidemia    Migraines    Vertigo    Past Surgical History:  Procedure Laterality Date   ABDOMINAL AORTAGRAM N/A 01/10/2013   Procedure: ABDOMINAL AORTAGRAM;  Surgeon: Larina Earthly, MD;  Location: [facility] CATH LAB;  Service: Cardiovascular;  Laterality: N/A;   ABDOMINAL AORTOGRAM W/LOWER EXTREMITY Bilateral 07/16/2018   Procedure: ABDOMINAL AORTOGRAM W/LOWER EXTREMITY;  Surgeon: Chuck Hint, MD;  Location: [facility] INVASIVE CV LAB;  Service: Cardiovascular;  Laterality: Bilateral;   ABDOMINAL AORTOGRAM W/LOWER EXTREMITY N/A 10/05/2020   Procedure: ABDOMINAL AORTOGRAM W/LOWER EXTREMITY;  Surgeon: Chuck Hint, MD;  Location: [facility] INVASIVE CV LAB;  Service: Cardiovascular;  Laterality: N/A;   BUBBLE STUDY  03/26/2021   Procedure: BUBBLE STUDY;  Surgeon: Little Ishikawa, MD;  Location: [facility] ENDOSCOPY;  Service: Cardiovascular;;   CHOLECYSTECTOMY     Gall Bladder   iliac artery angioplasty and stent placement  01/10/13   LOWER EXTREMITY ANGIOGRAM Bilateral 01/10/2013   Procedure: LOWER EXTREMITY ANGIOGRAM;  Surgeon: Larina Earthly, MD;  Location: [facility] CATH LAB;  Service: Cardiovascular;  Laterality: Bilateral;   PERCUTANEOUS STENT INTERVENTION Right 01/10/2013    Procedure: PERCUTANEOUS STENT INTERVENTION;  Surgeon: Larina Earthly, MD;  Location: [facility] CATH LAB;  Service: Cardiovascular;  Laterality: Right;  rt common iliac stent   PERIPHERAL VASCULAR CATHETERIZATION N/A 11/26/2015   Procedure: Abdominal Aortogram w/Lower Extremity;  Surgeon: Chuck Hint, MD;  Location: [facility] INVASIVE CV LAB;  Service: Cardiovascular;  Laterality: N/A;   PERIPHERAL VASCULAR CATHETERIZATION Right 11/26/2015   Procedure: Peripheral Vascular Balloon Angioplasty;  Surgeon: Chuck Hint, MD;  Location: [facility] INVASIVE CV LAB;  Service: Cardiovascular;  Laterality: Right;  rt common iliac   PERIPHERAL VASCULAR INTERVENTION  07/16/2018   Procedure: PERIPHERAL VASCULAR INTERVENTION;  Surgeon: Chuck Hint, MD;  Location: [facility] INVASIVE CV LAB;  Service: Cardiovascular;;   PERIPHERAL VASCULAR INTERVENTION Right 10/05/2020   Procedure: PERIPHERAL VASCULAR INTERVENTION;  Surgeon: Chuck Hint, MD;  Location: [facility] INVASIVE CV LAB;  Service: Cardiovascular;  Laterality: Right;  Common and exteral iliac artery   TEE WITHOUT CARDIOVERSION N/A 03/26/2021   Procedure: TRANSESOPHAGEAL ECHOCARDIOGRAM (TEE);  Surgeon: Little Ishikawa, MD;  Location: [facility] ENDOSCOPY;  Service: Cardiovascular;  Laterality: N/A;   TOTAL ABDOMINAL HYSTERECTOMY     Patient Active Problem List   Diagnosis Date Noted   History of recent stroke 08/01/2021   Difficulty sleeping 08/01/2021   Decreased appetite 08/01/2021   Cryptogenic stroke (HCC) 05/10/2021   Hypertension associated with diabetes (HCC) 04/11/2021   Seasonal allergies 04/11/2020   Smoker  05/27/2019   Hyperlipidemia associated with type 2 diabetes mellitus (HCC)    Migraine without aura and with status migrainosus, not intractable 04/29/2016   Type 2 diabetes mellitus with other specified complication (HCC) 01/24/2016   Anxiety, generalized 07/24/2015   Major depressive disorder, recurrent episode, moderate (HCC)  05/30/2013   PAD (peripheral artery disease) (HCC) 02/09/2013    REFERRING PROVIDER: Neale Burly  REFERRING DIAG: Left upper arm pain.  THERAPY DIAG:  Acute pain of left shoulder  Stiffness of left shoulder, not elsewhere classified  Muscle weakness (generalized)  Rationale for Evaluation and Treatment: Rehabilitation  ONSET DATE: ~2 months  SUBJECTIVE:                                                                                                                                                                                      SUBJECTIVE STATEMENT: LT shldr 5-6/10 today. Hurts a lot at night.    PERTINENT HISTORY: DM.  See above.  PAIN:  Are you having pain? Yes: NPRS scale: 6/10 Pain location: Left shoulder/upper arm. Pain description: Shooting. Aggravating factors: As above. Relieving factors: As above.  PRECAUTIONS: Other: Supervise gait please.   WEIGHT BEARING RESTRICTIONS: No  FALLS:  Has patient fallen in last 6 months? Yes. Number of falls 1.  Using cane for safety.  LIVING ENVIRONMENT: Lives with: lives with their spouse Lives in: House/apartment Has following equipment at home: Single point cane   PLOF: Independent with basic ADLs and Independent with household mobility with device  PATIENT GOALS:Use left UE without pain.  NEXT MD VISIT:   OBJECTIVE:   DIAGNOSTIC FINDINGS:   08/11/22:   IMPRESSION: Very mild glenohumeral and acromioclavicular osteoarthritis.  PATIENT SURVEYS:  FOTO .  POSTURE: Forward head and rounded shoulders.  UPPER EXTREMITY ROM:   Active left shoulder flexion limited to 110 degrees and passive full.  ER was performed slowly but through a full range of motion.  Behind back to left SIJ region.  UPPER EXTREMITY MMT:   Patient able to hold left UE against gravity.  Not able to accurately assess due to pain.  ER strength with arm by side graded grossly at 4/5 and IR 4-/5 which appears limited by  pain.  DTR's:   Normal UE DTR's.   PALPATION:  Tender to palpation over left posterior cuff musculature and middle deltoid.   TODAY'S TREATMENT:  DATE:                          10-03-22 [place]  estim combo 1.5 w/cm2 to LT shldr  posteriolateral aspect STW to LT shldr musculature posterior cuff and lateral aspect HMP and IFC at 80-150 Hz on 40% scan x 15 minutes to patient's left shoulder.  Normal modality response following removal of modality.  PATIENT EDUCATION:   HOME EXERCISE PROGRAM:   ASSESSMENT:  CLINICAL IMPRESSION: The patient arrived today doing fair with LT shldr, but pain was 6/10. She was able to perform AAROM with pulleys and did good f/b [place] combo and STW performed to LT shldr for pain management. Pt did great with decreased pain end of session.      OBJECTIVE IMPAIRMENTS: decreased activity tolerance, decreased ROM, decreased strength, increased muscle spasms, and pain.   ACTIVITY LIMITATIONS: carrying, lifting, and reach over head  PARTICIPATION LIMITATIONS: meal prep, cleaning, and laundry  PERSONAL FACTORS: 1 comorbidity: DM  are also affecting patient's functional outcome.   REHAB POTENTIAL: Excellent  CLINICAL DECISION MAKING: Stable/uncomplicated  EVALUATION COMPLEXITY: Low   GOALS:  LONG TERM GOALS: Target date: 11/10/22  Ind with a HEP.  Goal status: INITIAL  2.  Active left shoulder flexion to 150 degrees so the patient can easily reach overhead.  Goal status: INITIAL  3.  Increase ROM so patient is able to reach behind back to L3.  Goal status: INITIAL  4.  Perform ADL's with pain not > 3/10.  Goal status: INITIAL  5.  Increase left shoulder strength to a solid 4+/5 to increase stability for performance of functional activities.  Goal status: INITIAL   PLAN:  PT FREQUENCY: 2x/week  PT DURATION: 6  weeks  PLANNED INTERVENTIONS: Therapeutic exercises, Therapeutic activity, Neuromuscular re-education, Patient/Family education, Self Care, Dry Needling, Electrical stimulation, Vasopneumatic device, Ultrasound, and Manual therapy  PLAN FOR NEXT SESSION: Combo e'stim/US at 1.50 W/CM2, STW/M, P and AAROM to patient's left shoulder.     Amaiyah Nordhoff,CHRIS, PTA 10/03/2022, 1:00 PM

## 2022-10-08 ENCOUNTER — Ambulatory Visit: Payer: 59 | Admitting: Physical Therapy

## 2022-10-09 NOTE — Progress Notes (Signed)
Carelink Summary Report / Loop Recorder 

## 2022-10-10 ENCOUNTER — Ambulatory Visit: Payer: 59 | Attending: Family Medicine | Admitting: *Deleted

## 2022-10-10 DIAGNOSIS — M6281 Muscle weakness (generalized): Secondary | ICD-10-CM | POA: Diagnosis not present

## 2022-10-10 DIAGNOSIS — M25512 Pain in left shoulder: Secondary | ICD-10-CM

## 2022-10-10 DIAGNOSIS — M25612 Stiffness of left shoulder, not elsewhere classified: Secondary | ICD-10-CM | POA: Diagnosis not present

## 2022-10-10 NOTE — Therapy (Signed)
OUTPATIENT PHYSICAL THERAPY SHOULDER EVALUATION   Patient Name: Hannah Mcdaniel MRN: [id number] DOB:1965/04/11, 57 y.o., female Today's Date: 10/10/2022  END OF SESSION:  PT End of Session - 10/10/22 0932     Visit Number 3    Number of Visits 12    Date for PT Re-Evaluation 11/10/22    Authorization Type FOTO.    PT Start Time 0930    PT Stop Time 1020    PT Time Calculation (min) 50 min             Past Medical History:  Diagnosis Date   Anxiety    Arterial occlusive disease Nov. 2014   Arthritis    Claudication of lower extremity (HCC) Nov. 2014   Right Lower Extremity rest pain   Colon polyps    Depression    Diabetes mellitus without complication (HCC)    GERD (gastroesophageal reflux disease)    Hyperlipidemia    Migraines    Vertigo    Past Surgical History:  Procedure Laterality Date   ABDOMINAL AORTAGRAM N/A 01/10/2013   Procedure: ABDOMINAL AORTAGRAM;  Surgeon: Larina Earthly, MD;  Location: [facility] CATH LAB;  Service: Cardiovascular;  Laterality: N/A;   ABDOMINAL AORTOGRAM W/LOWER EXTREMITY Bilateral 07/16/2018   Procedure: ABDOMINAL AORTOGRAM W/LOWER EXTREMITY;  Surgeon: Chuck Hint, MD;  Location: [facility] INVASIVE CV LAB;  Service: Cardiovascular;  Laterality: Bilateral;   ABDOMINAL AORTOGRAM W/LOWER EXTREMITY N/A 10/05/2020   Procedure: ABDOMINAL AORTOGRAM W/LOWER EXTREMITY;  Surgeon: Chuck Hint, MD;  Location: [facility] INVASIVE CV LAB;  Service: Cardiovascular;  Laterality: N/A;   BUBBLE STUDY  03/26/2021   Procedure: BUBBLE STUDY;  Surgeon: Little Ishikawa, MD;  Location: [facility] ENDOSCOPY;  Service: Cardiovascular;;   CHOLECYSTECTOMY     Gall Bladder   iliac artery angioplasty and stent placement  01/10/13   LOWER EXTREMITY ANGIOGRAM Bilateral 01/10/2013   Procedure: LOWER EXTREMITY ANGIOGRAM;  Surgeon: Larina Earthly, MD;  Location: [facility] CATH LAB;  Service: Cardiovascular;  Laterality: Bilateral;   PERCUTANEOUS STENT INTERVENTION Right 01/10/2013    Procedure: PERCUTANEOUS STENT INTERVENTION;  Surgeon: Larina Earthly, MD;  Location: [facility] CATH LAB;  Service: Cardiovascular;  Laterality: Right;  rt common iliac stent   PERIPHERAL VASCULAR CATHETERIZATION N/A 11/26/2015   Procedure: Abdominal Aortogram w/Lower Extremity;  Surgeon: Chuck Hint, MD;  Location: [facility] INVASIVE CV LAB;  Service: Cardiovascular;  Laterality: N/A;   PERIPHERAL VASCULAR CATHETERIZATION Right 11/26/2015   Procedure: Peripheral Vascular Balloon Angioplasty;  Surgeon: Chuck Hint, MD;  Location: [facility] INVASIVE CV LAB;  Service: Cardiovascular;  Laterality: Right;  rt common iliac   PERIPHERAL VASCULAR INTERVENTION  07/16/2018   Procedure: PERIPHERAL VASCULAR INTERVENTION;  Surgeon: Chuck Hint, MD;  Location: [facility] INVASIVE CV LAB;  Service: Cardiovascular;;   PERIPHERAL VASCULAR INTERVENTION Right 10/05/2020   Procedure: PERIPHERAL VASCULAR INTERVENTION;  Surgeon: Chuck Hint, MD;  Location: [facility] INVASIVE CV LAB;  Service: Cardiovascular;  Laterality: Right;  Common and exteral iliac artery   TEE WITHOUT CARDIOVERSION N/A 03/26/2021   Procedure: TRANSESOPHAGEAL ECHOCARDIOGRAM (TEE);  Surgeon: Little Ishikawa, MD;  Location: [facility] ENDOSCOPY;  Service: Cardiovascular;  Laterality: N/A;   TOTAL ABDOMINAL HYSTERECTOMY     Patient Active Problem List   Diagnosis Date Noted   History of recent stroke 08/01/2021   Difficulty sleeping 08/01/2021   Decreased appetite 08/01/2021   Cryptogenic stroke (HCC) 05/10/2021   Hypertension associated with diabetes (HCC) 04/11/2021   Seasonal allergies 04/11/2020   Smoker  05/27/2019   Hyperlipidemia associated with type 2 diabetes mellitus (HCC)    Migraine without aura and with status migrainosus, not intractable 04/29/2016   Type 2 diabetes mellitus with other specified complication (HCC) 01/24/2016   Anxiety, generalized 07/24/2015   Major depressive disorder, recurrent episode, moderate (HCC)  05/30/2013   PAD (peripheral artery disease) (HCC) 02/09/2013    REFERRING PROVIDER: Neale Burly  REFERRING DIAG: Left upper arm pain.  THERAPY DIAG:  Acute pain of left shoulder  Stiffness of left shoulder, not elsewhere classified  Muscle weakness (generalized)  Rationale for Evaluation and Treatment: Rehabilitation  ONSET DATE: ~2 months  SUBJECTIVE:                                                                                                                                                                                      SUBJECTIVE STATEMENT: LT shldr 5-6/10 today. Did okay after last visit    PERTINENT HISTORY: DM.  See above.  PAIN:  Are you having pain? Yes: NPRS scale: 6/10 Pain location: Left shoulder/upper arm. Pain description: Shooting. Aggravating factors: As above. Relieving factors: As above.  PRECAUTIONS: Other: Supervise gait please.   WEIGHT BEARING RESTRICTIONS: No  FALLS:  Has patient fallen in last 6 months? Yes. Number of falls 1.  Using cane for safety.  LIVING ENVIRONMENT: Lives with: lives with their spouse Lives in: House/apartment Has following equipment at home: Single point cane   PLOF: Independent with basic ADLs and Independent with household mobility with device  PATIENT GOALS:Use left UE without pain.  NEXT MD VISIT:   OBJECTIVE:   DIAGNOSTIC FINDINGS:   08/11/22:   IMPRESSION: Very mild glenohumeral and acromioclavicular osteoarthritis.  PATIENT SURVEYS:  FOTO .  POSTURE: Forward head and rounded shoulders.  UPPER EXTREMITY ROM:   Active left shoulder flexion limited to 110 degrees and passive full.  ER was performed slowly but through a full range of motion.  Behind back to left SIJ region.  UPPER EXTREMITY MMT:   Patient able to hold left UE against gravity.  Not able to accurately assess due to pain.  ER strength with arm by side graded grossly at 4/5 and IR 4-/5 which appears limited by  pain.  DTR's:   Normal UE DTR's.   PALPATION:  Tender to palpation over left posterior cuff musculature and middle deltoid.   TODAY'S TREATMENT:  DATE:                                               10-10-22                                    EXERCISE LOG  Exercise Repetitions and Resistance Comments  Pulleys X 5 mins   UE Ranger X                Blank cell = exercise not performed today                         [place]  estim combo 1.5 w/cm2 to LT shldr  posteriolateral aspect x 14 mins  HMP and IFC at 80-150 Hz on 40% scan x 15 minutes to patient's left shoulder.  Normal modality response following removal of modality.  PATIENT EDUCATION:   HOME EXERCISE PROGRAM:   ASSESSMENT:  CLINICAL IMPRESSION: The patient arrived today doing fair with LT shldr pain,  but pain was 6/10. She was able to perform AAROM with pulleys and UE ranger seated and did good f/b [place] combo with decreased pain end of session. Pt still with increased pain when elevating LT UE and has referred pain into deltoid. Flexion to 90 degrees with pain.      OBJECTIVE IMPAIRMENTS: decreased activity tolerance, decreased ROM, decreased strength, increased muscle spasms, and pain.   ACTIVITY LIMITATIONS: carrying, lifting, and reach over head  PARTICIPATION LIMITATIONS: meal prep, cleaning, and laundry  PERSONAL FACTORS: 1 comorbidity: DM  are also affecting patient's functional outcome.   REHAB POTENTIAL: Excellent  CLINICAL DECISION MAKING: Stable/uncomplicated  EVALUATION COMPLEXITY: Low   GOALS:  LONG TERM GOALS: Target date: 11/10/22  Ind with a HEP.  Goal status: INITIAL  2.  Active left shoulder flexion to 150 degrees so the patient can easily reach overhead.  Goal status: INITIAL  3.  Increase ROM so patient is able to reach behind back to L3.  Goal  status: INITIAL  4.  Perform ADL's with pain not > 3/10.  Goal status: INITIAL  5.  Increase left shoulder strength to a solid 4+/5 to increase stability for performance of functional activities.  Goal status: INITIAL   PLAN:  PT FREQUENCY: 2x/week  PT DURATION: 6 weeks  PLANNED INTERVENTIONS: Therapeutic exercises, Therapeutic activity, Neuromuscular re-education, Patient/Family education, Self Care, Dry Needling, Electrical stimulation, Vasopneumatic device, Ultrasound, and Manual therapy  PLAN FOR NEXT SESSION: Combo e'stim/US at 1.50 W/CM2, STW/M, P and AAROM to patient's left shoulder.     Romaine Maciolek,CHRIS, PTA 10/10/2022, 10:22 AM

## 2022-10-13 ENCOUNTER — Encounter: Payer: 59 | Admitting: *Deleted

## 2022-10-17 ENCOUNTER — Encounter: Payer: 59 | Admitting: *Deleted

## 2022-10-20 ENCOUNTER — Encounter: Payer: Self-pay | Admitting: Family Medicine

## 2022-10-20 ENCOUNTER — Other Ambulatory Visit: Payer: Self-pay | Admitting: Family Medicine

## 2022-10-20 ENCOUNTER — Ambulatory Visit (INDEPENDENT_AMBULATORY_CARE_PROVIDER_SITE_OTHER): Payer: 59 | Admitting: Family Medicine

## 2022-10-20 VITALS — BP 107/63 | HR 70 | Temp 98.7°F | Ht 61.0 in | Wt 133.0 lb

## 2022-10-20 DIAGNOSIS — R6889 Other general symptoms and signs: Secondary | ICD-10-CM | POA: Diagnosis not present

## 2022-10-20 DIAGNOSIS — R413 Other amnesia: Secondary | ICD-10-CM | POA: Diagnosis not present

## 2022-10-20 DIAGNOSIS — R200 Anesthesia of skin: Secondary | ICD-10-CM

## 2022-10-20 LAB — URINALYSIS, ROUTINE W REFLEX MICROSCOPIC
Bilirubin, UA: NEGATIVE
Ketones, UA: NEGATIVE
Leukocytes,UA: NEGATIVE
Nitrite, UA: NEGATIVE
Protein,UA: NEGATIVE
Specific Gravity, UA: <1.005 — ABNORMAL LOW (ref 1.005–1.030)
Urobilinogen, Ur: 0.2 mg/dL (ref 0.2–1.0)
pH, UA: 6 (ref 5.0–7.5)

## 2022-10-20 NOTE — Addendum Note (Signed)
Addended by: Arville Care on: 10/20/2022 10:45 AM   Modules accepted: Orders

## 2022-10-20 NOTE — Progress Notes (Addendum)
BP 107/63   Pulse 70   Temp 98.7 F (37.1 C)   Ht 5\' 1"  (1.549 m)   Wt 133 lb (60.3 kg)   SpO2 96%   BMI 25.13 kg/m    Subjective:   Patient ID: Hannah Mcdaniel, female    DOB: [birth year], 57 y.o.   MRN: [id number]  HPI: Hannah Mcdaniel is a 57 y.o. female presenting on 10/20/2022 for Memory Loss   HPI Worsening memory issues Patient is coming in today with worsening memory issues.  Family has noticed that she has been forgetting a lot more things recently.  She did have some memory issues after her strokes from last year but then she has had worsening memory over the past week, they have noticed that she is forgetting a lot more.  She was driving and got lost up in [place] and they had to go get her because she did not know where she was at and things like that have happened a couple times in the past week.  She did not remember who one of her neighbors was and family was concerned because of all of these things increasing.  She also complains of some numbness on the left side of her body and maybe some weakness in her left leg.  That is the same area that was affected by her stroke but she feels like maybe it is worsened a little bit over the past week or 2.    Relevant past medical, surgical, family and social history reviewed and updated as indicated. Interim medical history since our last visit reviewed. Allergies and medications reviewed and updated.  Review of Systems  Constitutional:  Negative for chills and fever.  Eyes:  Negative for visual disturbance.  Respiratory:  Negative for chest tightness and shortness of breath.   Cardiovascular:  Negative for chest pain and leg swelling.  Genitourinary:  Negative for dysuria, frequency and urgency.  Skin:  Negative for rash.  Neurological:  Positive for weakness and numbness. Negative for light-headedness and headaches.  Psychiatric/Behavioral:  Positive for confusion. Negative for agitation and behavioral problems.   All  other systems reviewed and are negative.   Per HPI unless specifically indicated above   Allergies as of 10/20/2022       Reactions   Crestor [rosuvastatin] Other (See Comments)   Severe muscle pain   Asa [aspirin] Rash   Can take low-dose aspirin.   Bempedoic Acid Other (See Comments)   Bilateral shoulder pain.   Codeine Rash   Penicillins Rash   Has patient had a PCN reaction causing immediate rash, facial/tongue/throat swelling, SOB or lightheadedness with hypotension:Yes Has patient had a PCN reaction causing severe rash involving mucus membranes or skin necrosis:Yes Has patient had a PCN reaction that required hospitalization:No Has patient had a PCN reaction occurring within the last 10 years:No If all of the above answers are "NO", then may proceed with Cephalosporin use.        Medication List        Accurate as of October 20, 2022 10:45 AM. If you have any questions, ask your nurse or doctor.          clopidogrel 75 MG tablet Commonly known as: Plavix Take 1 tablet (75 mg total) by mouth daily.   diclofenac Sodium 1 % Gel Commonly known as: Voltaren Apply 4 g topically 4 (four) times daily.   empagliflozin 10 MG Tabs tablet Commonly known as: Jardiance Take 1 tablet (10 mg total)  by mouth daily.   ezetimibe 10 MG tablet Commonly known as: ZETIA Take 1 tablet (10 mg total) by mouth daily.   hydrochlorothiazide 25 MG tablet Commonly known as: HYDRODIURIL Take 1 tablet (25 mg total) by mouth daily.   hydrOXYzine 25 MG capsule Commonly known as: VISTARIL Take 1 capsule (25 mg total) by mouth every 6 (six) hours as needed.   levocetirizine 5 MG tablet Commonly known as: XYZAL Take 1 tablet (5 mg total) by mouth every evening.   losartan 50 MG tablet Commonly known as: COZAAR TAKE 2 TABLETS BY MOUTH EVERY DAY   mirtazapine 7.5 MG tablet Commonly known as: REMERON TAKE 1 TABLET BY MOUTH AT BEDTIME.   montelukast 10 MG tablet Commonly known  as: SINGULAIR Take 1 tablet (10 mg total) by mouth at bedtime.   Praluent 150 MG/ML Soaj Generic drug: Alirocumab Inject 1 mL (150 mg total) into the skin every 14 (fourteen) days.   rOPINIRole 0.25 MG tablet Commonly known as: REQUIP TAKE 1-2 TABLETS (0.25-0.5 MG TOTAL) BY MOUTH AT BEDTIME.   Vitamin D (Ergocalciferol) 1.25 MG (50000 UNIT) Caps capsule Commonly known as: DRISDOL Take 1 capsule (50,000 Units total) by mouth every 7 (seven) days for 12 doses.         Objective:   BP 107/63   Pulse 70   Temp 98.7 F (37.1 C)   Ht 5\' 1"  (1.549 m)   Wt 133 lb (60.3 kg)   SpO2 96%   BMI 25.13 kg/m   Wt Readings from Last 3 Encounters:  10/20/22 133 lb (60.3 kg)  09/18/22 135 lb (61.2 kg)  08/06/22 134 lb (60.8 kg)    Physical Exam Vitals and nursing note reviewed.  Constitutional:      General: She is not in acute distress.    Appearance: She is well-developed. She is not diaphoretic.  Eyes:     Conjunctiva/sclera: Conjunctivae normal.  Cardiovascular:     Rate and Rhythm: Normal rate and regular rhythm.     Heart sounds: Normal heart sounds. No murmur heard. Pulmonary:     Effort: Pulmonary effort is normal. No respiratory distress.     Breath sounds: Normal breath sounds. No wheezing.  Abdominal:     General: Abdomen is flat. Bowel sounds are normal. There is no distension.     Tenderness: There is no abdominal tenderness. There is no guarding or rebound.  Musculoskeletal:        General: Normal range of motion.     Comments: 4 out of 5 strength in all 4 extremities  Skin:    General: Skin is warm and dry.     Findings: No rash.  Neurological:     General: No focal deficit present.     Mental Status: She is alert.     Sensory: Sensory deficit (Patient perceived slight sensory deficit on left side of her face) present.     Coordination: Coordination normal.  Psychiatric:        Behavior: Behavior normal.     Urinalysis: 3+ glucose and trace blood but  otherwise negative, will send for culture.  Assessment & Plan:   Problem List Items Addressed This Visit   None Visit Diagnoses     Memory changes    -  Primary   Relevant Orders   Urine Culture   Urinalysis, Routine w reflex microscopic   Ambulatory referral to Neurology   CBC with Differential/Platelet   CMP14+EGFR   TSH   Folate  Vitamin B12   MR Brain Wo Contrast   Forgetfulness       Relevant Orders   Urine Culture   Urinalysis, Routine w reflex microscopic   Ambulatory referral to Neurology   CBC with Differential/Platelet   CMP14+EGFR   TSH   Folate   Vitamin B12   MR Brain Wo Contrast   Left sided numbness       Relevant Orders   Ambulatory referral to Neurology   CBC with Differential/Platelet   CMP14+EGFR   TSH   Folate   Vitamin B12   MR Brain Wo Contrast     Do not know if we can do MRI because she has a loop recorder.  Will do urgent referral to neurology  Urine is normal, concerned that she possibly had another stroke but do not know if we can do an MRI because of her loop recorder  Will try to see if we can ordered MRI with loop recorder Follow up plan: Return if symptoms worsen or fail to improve.  Counseling provided for all of the vaccine components Orders Placed This Encounter  Procedures   Urine Culture   MR Brain Wo Contrast   Urinalysis, Routine w reflex microscopic   CBC with Differential/Platelet   CMP14+EGFR   TSH   Folate   Vitamin B12   Ambulatory referral to Neurology    Arville Care, MD [facility] 10/20/2022, 10:45 AM

## 2022-10-21 LAB — CBC WITH DIFFERENTIAL/PLATELET
Basophils Absolute: 0.1 10*3/uL (ref 0.0–0.2)
Basos: 1 %
EOS (ABSOLUTE): 0.1 10*3/uL (ref 0.0–0.4)
Eos: 1 %
Hematocrit: 46.4 % (ref 34.0–46.6)
Hemoglobin: 16.4 g/dL — ABNORMAL HIGH (ref 11.1–15.9)
Immature Grans (Abs): 0 10*3/uL (ref 0.0–0.1)
Immature Granulocytes: 0 %
Lymphocytes Absolute: 2.8 10*3/uL (ref 0.7–3.1)
Lymphs: 32 %
MCH: 31.9 pg (ref 26.6–33.0)
MCHC: 35.3 g/dL (ref 31.5–35.7)
MCV: 90 fL (ref 79–97)
Monocytes Absolute: 0.3 10*3/uL (ref 0.1–0.9)
Monocytes: 4 %
Neutrophils Absolute: 5.4 10*3/uL (ref 1.4–7.0)
Neutrophils: 62 %
Platelets: 286 10*3/uL (ref 150–450)
RBC: 5.14 x10E6/uL (ref 3.77–5.28)
RDW: 11.4 % — ABNORMAL LOW (ref 11.7–15.4)
WBC: 8.7 10*3/uL (ref 3.4–10.8)

## 2022-10-21 LAB — CMP14+EGFR
ALT: 8 IU/L (ref 0–32)
AST: 15 IU/L (ref 0–40)
Albumin: 4.5 g/dL (ref 3.8–4.9)
Alkaline Phosphatase: 129 IU/L — ABNORMAL HIGH (ref 44–121)
BUN/Creatinine Ratio: 10 (ref 9–23)
BUN: 11 mg/dL (ref 6–24)
Bilirubin Total: 0.5 mg/dL (ref 0.0–1.2)
CO2: 21 mmol/L (ref 20–29)
Calcium: 9.9 mg/dL (ref 8.7–10.2)
Chloride: 103 mmol/L (ref 96–106)
Creatinine, Ser: 1.09 mg/dL — ABNORMAL HIGH (ref 0.57–1.00)
Globulin, Total: 2.5 g/dL (ref 1.5–4.5)
Glucose: 103 mg/dL — ABNORMAL HIGH (ref 70–99)
Potassium: 4.2 mmol/L (ref 3.5–5.2)
Sodium: 141 mmol/L (ref 134–144)
Total Protein: 7 g/dL (ref 6.0–8.5)
eGFR: 59 mL/min/{1.73_m2} — ABNORMAL LOW (ref >59)

## 2022-10-21 LAB — VITAMIN B12: Vitamin B-12: 343 pg/mL (ref 232–1245)

## 2022-10-21 LAB — FOLATE: Folate: 8.8 ng/mL (ref >3.0)

## 2022-10-21 LAB — TSH: TSH: 1.15 u[IU]/mL (ref 0.450–4.500)

## 2022-10-22 ENCOUNTER — Ambulatory Visit (HOSPITAL_COMMUNITY): Payer: 59

## 2022-10-25 ENCOUNTER — Other Ambulatory Visit: Payer: 59
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
## 2022-10-28 ENCOUNTER — Encounter: Payer: Self-pay | Admitting: Physician Assistant

## 2022-10-28 ENCOUNTER — Telehealth: Payer: Self-pay | Admitting: Physician Assistant

## 2022-10-28 ENCOUNTER — Ambulatory Visit: Payer: 59

## 2022-10-28 ENCOUNTER — Ambulatory Visit: Payer: 59 | Admitting: Physician Assistant

## 2022-10-28 VITALS — BP 153/85 | HR 85 | Resp 18 | Ht 61.0 in | Wt 134.0 lb

## 2022-10-28 DIAGNOSIS — R413 Other amnesia: Secondary | ICD-10-CM | POA: Insufficient documentation

## 2022-10-28 DIAGNOSIS — I6930 Unspecified sequelae of cerebral infarction: Secondary | ICD-10-CM | POA: Diagnosis not present

## 2022-10-28 NOTE — Telephone Encounter (Signed)
Patient's daughter called stating that she called to reschedule the patients MRI but now the Radiology place will not do the MRI due to a Stint that is in the patients stomach, she is requestng a call back

## 2022-10-28 NOTE — Patient Instructions (Addendum)
It was a pleasure to see you today at our office.   Recommendations:   MRI of the brain  Continue Plavix  Continue secondary stroke prevention  Agree with loop recorder, follow with Cards  Start B12 supplements  Follow up in  2-3 weeks  Referral to PT and ST    For psychiatric meds, mood meds: Please have your primary care physician manage these medications.  If you have any severe symptoms of a stroke, or other severe issues such as confusion,severe chills or fever, etc call 911 or go to the ER as you may need to be evaluated further     For assessment of decision of mental capacity and competency:  Call Dr. Erick Blinks, geriatric psychiatrist at [phone]  Counseling regarding caregiver distress, including caregiver depression, anxiety and issues regarding community resources, adult day care programs, adult living facilities, or memory care questions:  please contact your  Primary Doctor's Social Worker   Whom to call: Memory  decline, memory medications: Call our office [phone]    [email or web address]   RECOMMENDATIONS FOR ALL PATIENTS WITH MEMORY PROBLEMS: 1. Continue to exercise (Recommend 30 minutes of walking everyday, or 3 hours every week) 2. Increase social interactions - continue going to [place] and enjoy social gatherings with friends and family 3. Eat healthy, avoid fried foods and eat more fruits and vegetables 4. Maintain adequate blood pressure, blood sugar, and blood cholesterol level. Reducing the risk of stroke and cardiovascular disease also helps promoting better memory. 5. Avoid stressful situations. Live a simple life and avoid aggravations. Organize your time and prepare for the next day in anticipation. 6. Sleep well, avoid any interruptions of sleep and avoid any distractions in the bedroom that may interfere with adequate sleep quality 7. Avoid sugar, avoid sweets as there is a strong link  between excessive sugar intake, diabetes, and cognitive impairment We discussed the Mediterranean diet, which has been shown to help patients reduce the risk of progressive memory disorders and reduces cardiovascular risk. This includes eating fish, eat fruits and green leafy vegetables, nuts like almonds and hazelnuts, walnuts, and also use olive oil. Avoid fast foods and fried foods as much as possible. Avoid sweets and sugar as sugar use has been linked to worsening of memory function.  There is always a concern of gradual progression of memory problems. If this is the case, then we may need to adjust level of care according to patient needs. Support, both to the patient and caregiver, should then be put into place.      You have been referred for a neuropsychological evaluation (i.e., evaluation of memory and thinking abilities). Please bring someone with you to this appointment if possible, as it is helpful for the doctor to hear from both you and another adult who knows you well. Please bring eyeglasses and hearing aids if you wear them.    The evaluation will take approximately 3 hours and has two parts:   The first part is a clinical interview with the neuropsychologist (Dr. Milbert Coulter or Dr. Roseanne Reno). During the interview, the neuropsychologist will speak with you and the individual you brought to the appointment.    The second part of the evaluation is testing with the doctor's technician Annabelle Harman or Selena Batten). During the testing, the technician will ask you to remember different types of material, solve problems, and answer some questionnaires. Your family member will not be present for this portion of the evaluation.   Please note: We must reserve several  hours of the neuropsychologist's time and the psychometrician's time for your evaluation appointment. As such, there is a No-Show fee of $100. If you are unable to attend any of your appointments, please contact our office as soon as possible to  reschedule.      DRIVING: Regarding driving, in patients with progressive memory problems, driving will be impaired. We advise to have someone else do the driving if trouble finding directions or if minor accidents are reported. Independent driving assessment is available to determine safety of driving.   If you are interested in the driving assessment, you can contact the following:  [facility] in [place] [phone]  [facility] [phone]  [facility] [phone]  [facility] [phone] or [phone]   FALL PRECAUTIONS: Be cautious when walking. Scan the area for obstacles that may increase the risk of trips and falls. When getting up in the mornings, sit up at the edge of the bed for a few minutes before getting out of bed. Consider elevating the bed at the head end to avoid drop of blood pressure when getting up. Walk always in a well-lit room (use night lights in the walls). Avoid area rugs or power cords from appliances in the middle of the walkways. Use a walker or a cane if necessary and consider physical therapy for balance exercise. Get your eyesight checked regularly.  FINANCIAL OVERSIGHT: Supervision, especially oversight when making financial decisions or transactions is also recommended.  HOME SAFETY: Consider the safety of the kitchen when operating appliances like stoves, microwave oven, and blender. Consider having supervision and share cooking responsibilities until no longer able to participate in those. Accidents with firearms and other hazards in the house should be identified and addressed as well.   ABILITY TO BE LEFT ALONE: If patient is unable to contact 911 operator, consider using LifeLine, or when the need is there, arrange for someone to stay with patients. Smoking is a fire hazard, consider supervision or cessation. Risk of wandering should be assessed by caregiver and if detected at any point,  supervision and safe proof recommendations should be instituted.  MEDICATION SUPERVISION: Inability to self-administer medication needs to be constantly addressed. Implement a mechanism to ensure safe administration of the medications.      Mediterranean Diet A Mediterranean diet refers to food and lifestyle choices that are based on the traditions of countries located on the [place]. This way of eating has been shown to help prevent certain conditions and improve outcomes for people who have chronic diseases, like kidney disease and heart disease. What are tips for following this plan? Lifestyle  Cook and eat meals together with your family, when possible. Drink enough fluid to keep your urine clear or pale yellow. Be physically active every day. This includes: Aerobic exercise like running or swimming. Leisure activities like gardening, walking, or housework. Get 7-8 hours of sleep each night. If recommended by your health care provider, drink red wine in moderation. This means 1 glass a day for nonpregnant women and 2 glasses a day for men. A glass of wine equals 5 oz (150 mL). Reading food labels  Check the serving size of packaged foods. For foods such as rice and pasta, the serving size refers to the amount of cooked product, not dry. Check the total fat in packaged foods. Avoid foods that have saturated fat or trans fats. Check the ingredients list for added sugars, such as corn syrup. Shopping  At the grocery store, buy  most of your food from the areas near the walls of the store. This includes: Fresh fruits and vegetables (produce). Grains, beans, nuts, and seeds. Some of these may be available in unpackaged forms or large amounts (in bulk). Fresh seafood. Poultry and eggs. Low-fat dairy products. Buy whole ingredients instead of prepackaged foods. Buy fresh fruits and vegetables in-season from local farmers markets. Buy frozen fruits and vegetables in resealable  bags. If you do not have access to quality fresh seafood, buy precooked frozen shrimp or canned fish, such as tuna, salmon, or sardines. Buy small amounts of raw or cooked vegetables, salads, or olives from the deli or salad bar at your store. Stock your pantry so you always have certain foods on hand, such as olive oil, canned tuna, canned tomatoes, rice, pasta, and beans. Cooking  Cook foods with extra-virgin olive oil instead of using butter or other vegetable oils. Have meat as a side dish, and have vegetables or grains as your main dish. This means having meat in small portions or adding small amounts of meat to foods like pasta or stew. Use beans or vegetables instead of meat in common dishes like chili or lasagna. Experiment with different cooking methods. Try roasting or broiling vegetables instead of steaming or sauteing them. Add frozen vegetables to soups, stews, pasta, or rice. Add nuts or seeds for added healthy fat at each meal. You can add these to yogurt, salads, or vegetable dishes. Marinate fish or vegetables using olive oil, lemon juice, garlic, and fresh herbs. Meal planning  Plan to eat 1 vegetarian meal one day each week. Try to work up to 2 vegetarian meals, if possible. Eat seafood 2 or more times a week. Have healthy snacks readily available, such as: Vegetable sticks with hummus. Greek yogurt. Fruit and nut trail mix. Eat balanced meals throughout the week. This includes: Fruit: 2-3 servings a day Vegetables: 4-5 servings a day Low-fat dairy: 2 servings a day Fish, poultry, or lean meat: 1 serving a day Beans and legumes: 2 or more servings a week Nuts and seeds: 1-2 servings a day Whole grains: 6-8 servings a day Extra-virgin olive oil: 3-4 servings a day Limit red meat and sweets to only a few servings a month What are my food choices? Mediterranean diet Recommended Grains: Whole-grain pasta. Brown rice. Bulgar wheat. Polenta. Couscous. Whole-wheat bread.  Orpah Cobb. Vegetables: Artichokes. Beets. Broccoli. Cabbage. Carrots. Eggplant. Green beans. Chard. Kale. Spinach. Onions. Leeks. Peas. Squash. Tomatoes. Peppers. Radishes. Fruits: Apples. Apricots. Avocado. Berries. Bananas. Cherries. Dates. Figs. Grapes. Lemons. Melon. Oranges. Peaches. Plums. Pomegranate. Meats and other protein foods: Beans. Almonds. Sunflower seeds. Pine nuts. Peanuts. Cod. Salmon. Scallops. Shrimp. Tuna. Tilapia. Clams. Oysters. Eggs. Dairy: Low-fat milk. Cheese. Greek yogurt. Beverages: Water. Red wine. Herbal tea. Fats and oils: Extra virgin olive oil. Avocado oil. Grape seed oil. Sweets and desserts: [place] yogurt with honey. Baked apples. Poached pears. Trail mix. Seasoning and other foods: Basil. Cilantro. Coriander. Cumin. Mint. Parsley. Sage. Rosemary. Tarragon. Garlic. Oregano. Thyme. Pepper. Balsalmic vinegar. Tahini. Hummus. Tomato sauce. Olives. Mushrooms. Limit these Grains: Prepackaged pasta or rice dishes. Prepackaged cereal with added sugar. Vegetables: Deep fried potatoes (french fries). Fruits: Fruit canned in syrup. Meats and other protein foods: Beef. Pork. Lamb. Poultry with skin. Hot dogs. Tomasa Blase. Dairy: Ice cream. Sour cream. Whole milk. Beverages: Juice. Sugar-sweetened soft drinks. Beer. Liquor and spirits. Fats and oils: Butter. Canola oil. Vegetable oil. Beef fat (tallow). Lard. Sweets and desserts: Cookies. Cakes. Pies. Candy. Seasoning and  other foods: Mayonnaise. Premade sauces and marinades. The items listed may not be a complete list. Talk with your dietitian about what dietary choices are right for you. Summary The Mediterranean diet includes both food and lifestyle choices. Eat a variety of fresh fruits and vegetables, beans, nuts, seeds, and whole grains. Limit the amount of red meat and sweets that you eat. Talk with your health care provider about whether it is safe for you to drink red wine in moderation. This means 1 glass a day  for nonpregnant women and 2 glasses a day for men. A glass of wine equals 5 oz (150 mL). This information is not intended to replace advice given to you by your health care provider. Make sure you discuss any questions you have with your health care provider. Document Released: 09/13/2015 Document Revised: 10/16/2015 Document Reviewed: 09/13/2015 Elsevier Interactive Patient Education  2017 [employer].

## 2022-10-29 NOTE — Telephone Encounter (Signed)
Spoke with DPR Gearldine Bienenstock the daughter she is going to contact [facility] [phone] and get the MRI scheduled, The stint information was given to them, they will schedule and update and send [place] information. Gearldine Bienenstock thanked me for calling.

## 2022-10-31 ENCOUNTER — Inpatient Hospital Stay (HOSPITAL_COMMUNITY)
Admission: EM | Admit: 2022-10-31 | Discharge: 2022-11-01 | DRG: 065 | Disposition: A | Discharge status: Home / Self Care | Payer: 59 | Attending: Family Medicine | Admitting: Family Medicine

## 2022-10-31 ENCOUNTER — Other Ambulatory Visit: Payer: Self-pay

## 2022-10-31 ENCOUNTER — Emergency Department (HOSPITAL_COMMUNITY): Payer: 59

## 2022-10-31 ENCOUNTER — Telehealth: Payer: Self-pay | Admitting: Family Medicine

## 2022-10-31 ENCOUNTER — Ambulatory Visit
Admission: RE | Admit: 2022-10-31 | Discharge: 2022-10-31 | Disposition: A | Discharge status: Home / Self Care | Payer: 59 | Source: Ambulatory Visit | Attending: Family Medicine | Admitting: Family Medicine

## 2022-10-31 ENCOUNTER — Encounter (HOSPITAL_COMMUNITY): Payer: Self-pay | Admitting: Emergency Medicine

## 2022-10-31 DIAGNOSIS — Z83438 Family history of other disorder of lipoprotein metabolism and other lipidemia: Secondary | ICD-10-CM

## 2022-10-31 DIAGNOSIS — Z8249 Family history of ischemic heart disease and other diseases of the circulatory system: Secondary | ICD-10-CM

## 2022-10-31 DIAGNOSIS — Z823 Family history of stroke: Secondary | ICD-10-CM

## 2022-10-31 DIAGNOSIS — I10 Essential (primary) hypertension: Secondary | ICD-10-CM | POA: Diagnosis present

## 2022-10-31 DIAGNOSIS — I63231 Cerebral infarction due to unspecified occlusion or stenosis of right carotid arteries: Secondary | ICD-10-CM | POA: Diagnosis not present

## 2022-10-31 DIAGNOSIS — F17210 Nicotine dependence, cigarettes, uncomplicated: Secondary | ICD-10-CM | POA: Diagnosis not present

## 2022-10-31 DIAGNOSIS — I639 Cerebral infarction, unspecified: Secondary | ICD-10-CM | POA: Diagnosis not present

## 2022-10-31 DIAGNOSIS — Z23 Encounter for immunization: Secondary | ICD-10-CM

## 2022-10-31 DIAGNOSIS — Z886 Allergy status to analgesic agent status: Secondary | ICD-10-CM

## 2022-10-31 DIAGNOSIS — Z91148 Patient's other noncompliance with medication regimen for other reason: Secondary | ICD-10-CM

## 2022-10-31 DIAGNOSIS — Z8041 Family history of malignant neoplasm of ovary: Secondary | ICD-10-CM

## 2022-10-31 DIAGNOSIS — Z888 Allergy status to other drugs, medicaments and biological substances status: Secondary | ICD-10-CM | POA: Diagnosis not present

## 2022-10-31 DIAGNOSIS — Z825 Family history of asthma and other chronic lower respiratory diseases: Secondary | ICD-10-CM

## 2022-10-31 DIAGNOSIS — I63411 Cerebral infarction due to embolism of right middle cerebral artery: Secondary | ICD-10-CM | POA: Diagnosis not present

## 2022-10-31 DIAGNOSIS — I69354 Hemiplegia and hemiparesis following cerebral infarction affecting left non-dominant side: Secondary | ICD-10-CM | POA: Diagnosis not present

## 2022-10-31 DIAGNOSIS — R413 Other amnesia: Secondary | ICD-10-CM

## 2022-10-31 DIAGNOSIS — Z818 Family history of other mental and behavioral disorders: Secondary | ICD-10-CM

## 2022-10-31 DIAGNOSIS — Z7984 Long term (current) use of oral hypoglycemic drugs: Secondary | ICD-10-CM

## 2022-10-31 DIAGNOSIS — E785 Hyperlipidemia, unspecified: Secondary | ICD-10-CM | POA: Diagnosis not present

## 2022-10-31 DIAGNOSIS — Z79899 Other long term (current) drug therapy: Secondary | ICD-10-CM

## 2022-10-31 DIAGNOSIS — I63511 Cerebral infarction due to unspecified occlusion or stenosis of right middle cerebral artery: Secondary | ICD-10-CM | POA: Diagnosis not present

## 2022-10-31 DIAGNOSIS — R6889 Other general symptoms and signs: Secondary | ICD-10-CM

## 2022-10-31 DIAGNOSIS — E1151 Type 2 diabetes mellitus with diabetic peripheral angiopathy without gangrene: Secondary | ICD-10-CM | POA: Diagnosis not present

## 2022-10-31 DIAGNOSIS — Z7902 Long term (current) use of antithrombotics/antiplatelets: Secondary | ICD-10-CM

## 2022-10-31 DIAGNOSIS — Z803 Family history of malignant neoplasm of breast: Secondary | ICD-10-CM | POA: Diagnosis not present

## 2022-10-31 DIAGNOSIS — E1165 Type 2 diabetes mellitus with hyperglycemia: Secondary | ICD-10-CM | POA: Diagnosis present

## 2022-10-31 DIAGNOSIS — Z66 Do not resuscitate: Secondary | ICD-10-CM | POA: Diagnosis not present

## 2022-10-31 DIAGNOSIS — R200 Anesthesia of skin: Secondary | ICD-10-CM

## 2022-10-31 DIAGNOSIS — Z806 Family history of leukemia: Secondary | ICD-10-CM | POA: Diagnosis not present

## 2022-10-31 DIAGNOSIS — R9089 Other abnormal findings on diagnostic imaging of central nervous system: Secondary | ICD-10-CM | POA: Diagnosis not present

## 2022-10-31 DIAGNOSIS — Z90710 Acquired absence of both cervix and uterus: Secondary | ICD-10-CM

## 2022-10-31 DIAGNOSIS — I6340 Cerebral infarction due to embolism of unspecified cerebral artery: Principal | ICD-10-CM

## 2022-10-31 DIAGNOSIS — R531 Weakness: Secondary | ICD-10-CM | POA: Diagnosis not present

## 2022-10-31 DIAGNOSIS — K219 Gastro-esophageal reflux disease without esophagitis: Secondary | ICD-10-CM | POA: Diagnosis present

## 2022-10-31 DIAGNOSIS — R4701 Aphasia: Secondary | ICD-10-CM | POA: Diagnosis present

## 2022-10-31 DIAGNOSIS — Z885 Allergy status to narcotic agent status: Secondary | ICD-10-CM | POA: Diagnosis not present

## 2022-10-31 DIAGNOSIS — Z880 Allergy status to penicillin: Secondary | ICD-10-CM

## 2022-10-31 DIAGNOSIS — Z7982 Long term (current) use of aspirin: Secondary | ICD-10-CM

## 2022-10-31 DIAGNOSIS — R4182 Altered mental status, unspecified: Secondary | ICD-10-CM | POA: Diagnosis not present

## 2022-10-31 DIAGNOSIS — I6389 Other cerebral infarction: Secondary | ICD-10-CM | POA: Diagnosis not present

## 2022-10-31 DIAGNOSIS — Z801 Family history of malignant neoplasm of trachea, bronchus and lung: Secondary | ICD-10-CM

## 2022-10-31 DIAGNOSIS — Z833 Family history of diabetes mellitus: Secondary | ICD-10-CM

## 2022-10-31 DIAGNOSIS — I63233 Cerebral infarction due to unspecified occlusion or stenosis of bilateral carotid arteries: Secondary | ICD-10-CM | POA: Diagnosis not present

## 2022-10-31 LAB — CBC
HCT: 43.7 % (ref 36.0–46.0)
HCT: 41.8 % (ref 36.0–46.0)
Hemoglobin: 14.8 g/dL (ref 12.0–15.0)
Hemoglobin: 14.4 g/dL (ref 12.0–15.0)
MCH: 30.2 pg (ref 26.0–34.0)
MCH: 30.2 pg (ref 26.0–34.0)
MCHC: 33.9 g/dL (ref 30.0–36.0)
MCHC: 34.4 g/dL (ref 30.0–36.0)
MCV: 89.2 fL (ref 80.0–100.0)
MCV: 87.6 fL (ref 80.0–100.0)
Platelets: 268 10*3/uL (ref 150–400)
Platelets: 267 10*3/uL (ref 150–400)
RBC: 4.9 MIL/uL (ref 3.87–5.11)
RBC: 4.77 MIL/uL (ref 3.87–5.11)
RDW: 11.3 % — ABNORMAL LOW (ref 11.5–15.5)
RDW: 11.4 % — ABNORMAL LOW (ref 11.5–15.5)
WBC: 8.3 10*3/uL (ref 4.0–10.5)
WBC: 7.9 10*3/uL (ref 4.0–10.5)
nRBC: 0 % (ref 0.0–0.2)
nRBC: 0 % (ref 0.0–0.2)

## 2022-10-31 LAB — DIFFERENTIAL
Abs Immature Granulocytes: 0.01 10*3/uL (ref 0.00–0.07)
Basophils Absolute: 0.1 10*3/uL (ref 0.0–0.1)
Basophils Relative: 1 %
Eosinophils Absolute: 0.1 10*3/uL (ref 0.0–0.5)
Eosinophils Relative: 1 %
Immature Granulocytes: 0 %
Lymphocytes Relative: 41 %
Lymphs Abs: 3.4 10*3/uL (ref 0.7–4.0)
Monocytes Absolute: 0.4 10*3/uL (ref 0.1–1.0)
Monocytes Relative: 5 %
Neutro Abs: 4.4 10*3/uL (ref 1.7–7.7)
Neutrophils Relative %: 52 %

## 2022-10-31 LAB — COMPREHENSIVE METABOLIC PANEL
ALT: 18 U/L (ref 0–44)
AST: 26 U/L (ref 15–41)
Albumin: 3.8 g/dL (ref 3.5–5.0)
Alkaline Phosphatase: 101 U/L (ref 38–126)
Anion gap: 10 (ref 5–15)
BUN: 16 mg/dL (ref 6–20)
CO2: 25 mmol/L (ref 22–32)
Calcium: 9 mg/dL (ref 8.9–10.3)
Chloride: 100 mmol/L (ref 98–111)
Creatinine, Ser: 1.08 mg/dL — ABNORMAL HIGH (ref 0.44–1.00)
GFR, Estimated: 60 mL/min — ABNORMAL LOW (ref >60)
Glucose, Bld: 155 mg/dL — ABNORMAL HIGH (ref 70–99)
Potassium: 3.4 mmol/L — ABNORMAL LOW (ref 3.5–5.1)
Sodium: 135 mmol/L (ref 135–145)
Total Bilirubin: 0.6 mg/dL (ref 0.3–1.2)
Total Protein: 6.7 g/dL (ref 6.5–8.1)

## 2022-10-31 LAB — GLUCOSE, CAPILLARY
Glucose-Capillary: 180 mg/dL — ABNORMAL HIGH (ref 70–99)
Glucose-Capillary: 115 mg/dL — ABNORMAL HIGH (ref 70–99)

## 2022-10-31 LAB — I-STAT CHEM 8, ED
BUN: 19 mg/dL (ref 6–20)
Calcium, Ion: 1.16 mmol/L (ref 1.15–1.40)
Chloride: 100 mmol/L (ref 98–111)
Creatinine, Ser: 1 mg/dL (ref 0.44–1.00)
Glucose, Bld: 150 mg/dL — ABNORMAL HIGH (ref 70–99)
HCT: 45 % (ref 36.0–46.0)
Hemoglobin: 15.3 g/dL — ABNORMAL HIGH (ref 12.0–15.0)
Potassium: 3.5 mmol/L (ref 3.5–5.1)
Sodium: 136 mmol/L (ref 135–145)
TCO2: 25 mmol/L (ref 22–32)

## 2022-10-31 LAB — CREATININE, SERUM
Creatinine, Ser: 1.19 mg/dL — ABNORMAL HIGH (ref 0.44–1.00)
GFR, Estimated: 53 mL/min — ABNORMAL LOW (ref >60)

## 2022-10-31 LAB — CBG MONITORING, ED: Glucose-Capillary: 135 mg/dL — ABNORMAL HIGH (ref 70–99)

## 2022-10-31 LAB — PROTIME-INR
INR: 0.9 (ref 0.8–1.2)
Prothrombin Time: 12.7 s (ref 11.4–15.2)

## 2022-10-31 LAB — HEMOGLOBIN A1C
Hgb A1c MFr Bld: 6.4 % — ABNORMAL HIGH (ref 4.8–5.6)
Mean Plasma Glucose: 136.98 mg/dL

## 2022-10-31 LAB — ETHANOL: Alcohol, Ethyl (B): <10 mg/dL (ref <10)

## 2022-10-31 LAB — HIV ANTIBODY (ROUTINE TESTING W REFLEX): HIV Screen 4th Generation wRfx: NONREACTIVE

## 2022-10-31 LAB — APTT: aPTT: 27 s (ref 24–36)

## 2022-10-31 MED ORDER — ACETAMINOPHEN 325 MG PO TABS: 650.0000 mg | ORAL_TABLET | ORAL | Status: DC | PRN

## 2022-10-31 MED ORDER — ORAL CARE MOUTH RINSE: 15.0000 mL | OROMUCOSAL | Status: DC | PRN

## 2022-10-31 MED ORDER — HEPARIN SODIUM (PORCINE) 5000 UNIT/ML IJ SOLN
5000.0000 [IU] | Freq: Three times a day (TID) | INTRAMUSCULAR | Status: DC
  Administered 2022-10-31 – 2022-11-01 (×3): 5000 [IU] via SUBCUTANEOUS
  Filled 2022-10-31 (×3): qty 1

## 2022-10-31 MED ORDER — SODIUM CHLORIDE 0.9% FLUSH: 3.0000 mL | Freq: Once | INTRAVENOUS | Status: DC

## 2022-10-31 MED ORDER — IOHEXOL 350 MG/ML SOLN
80.0000 mL | Freq: Once | INTRAVENOUS | Status: AC | PRN
  Administered 2022-10-31: 80 mL via INTRAVENOUS

## 2022-10-31 MED ORDER — ACETAMINOPHEN 650 MG RE SUPP: 650.0000 mg | RECTAL | Status: DC | PRN

## 2022-10-31 MED ORDER — ASPIRIN 81 MG PO TBEC
81.0000 mg | DELAYED_RELEASE_TABLET | Freq: Every day | ORAL | Status: DC
  Administered 2022-10-31 – 2022-11-01 (×2): 81 mg via ORAL
  Filled 2022-10-31 (×2): qty 1

## 2022-10-31 MED ORDER — ACETAMINOPHEN 160 MG/5ML PO SOLN: 650.0000 mg | ORAL | Status: DC | PRN

## 2022-10-31 MED ORDER — INFLUENZA VIRUS VACC SPLIT PF (FLUZONE) 0.5 ML IM SUSY
0.5000 mL | PREFILLED_SYRINGE | INTRAMUSCULAR | Status: AC
  Administered 2022-11-01: 0.5 mL via INTRAMUSCULAR
  Filled 2022-10-31: qty 0.5

## 2022-10-31 MED ORDER — LOSARTAN POTASSIUM 50 MG PO TABS
100.0000 mg | ORAL_TABLET | Freq: Every day | ORAL | Status: DC
  Administered 2022-11-01: 100 mg via ORAL
  Filled 2022-10-31: qty 2

## 2022-10-31 MED ORDER — STROKE: EARLY STAGES OF RECOVERY BOOK
Freq: Once | Status: AC
  Filled 2022-10-31: qty 1

## 2022-10-31 MED ORDER — HYDRALAZINE HCL 20 MG/ML IJ SOLN: 10.0000 mg | Freq: Four times a day (QID) | INTRAMUSCULAR | Status: DC | PRN

## 2022-10-31 MED ORDER — VITAMIN D (ERGOCALCIFEROL) 1.25 MG (50000 UNIT) PO CAPS: 50000.0000 [IU] | ORAL_CAPSULE | ORAL | Status: DC

## 2022-10-31 MED ORDER — MONTELUKAST SODIUM 10 MG PO TABS
10.0000 mg | ORAL_TABLET | Freq: Every day | ORAL | Status: DC
  Administered 2022-10-31: 10 mg via ORAL
  Filled 2022-10-31: qty 1

## 2022-10-31 MED ORDER — CLOPIDOGREL BISULFATE 75 MG PO TABS
75.0000 mg | ORAL_TABLET | Freq: Every day | ORAL | Status: DC
  Administered 2022-10-31 – 2022-11-01 (×2): 75 mg via ORAL
  Filled 2022-10-31 (×2): qty 1

## 2022-10-31 MED ORDER — INSULIN ASPART 100 UNIT/ML IJ SOLN
0.0000 [IU] | Freq: Three times a day (TID) | INTRAMUSCULAR | Status: DC
  Administered 2022-10-31 – 2022-11-01 (×2): 2 [IU] via SUBCUTANEOUS

## 2022-10-31 MED ORDER — HYDROCHLOROTHIAZIDE 25 MG PO TABS
25.0000 mg | ORAL_TABLET | Freq: Every day | ORAL | Status: DC
  Administered 2022-11-01: 25 mg via ORAL
  Filled 2022-10-31: qty 1

## 2022-10-31 MED ORDER — EMPAGLIFLOZIN 10 MG PO TABS
10.0000 mg | ORAL_TABLET | Freq: Every day | ORAL | Status: DC
  Administered 2022-11-01: 10 mg via ORAL
  Filled 2022-10-31: qty 1

## 2022-10-31 MED ORDER — SODIUM CHLORIDE 0.9 % IV SOLN: INTRAVENOUS | Status: AC

## 2022-10-31 MED ORDER — STROKE: EARLY STAGES OF RECOVERY BOOK: Freq: Once | Status: DC

## 2022-10-31 MED ORDER — SENNOSIDES-DOCUSATE SODIUM 8.6-50 MG PO TABS: 1.0000 | ORAL_TABLET | Freq: Every evening | ORAL | Status: DC | PRN

## 2022-10-31 NOTE — ED Triage Notes (Signed)
Pt arrives via POV from [facility] where patient had outpt MRI showing new acute right MCA infarct. Pt reports symptoms began approx 3 weeks ago with new onset confusion. Getting lost while driving. Pt currently awake, alert, pt has residual left arm weakness from previous stroke. Pt also reports new left eye with blurred vision. Pt reports some aphasia which is new. Pt ambulatory with cane. Takes 81mg  ASA daily.

## 2022-10-31 NOTE — Consult Note (Addendum)
Stroke Neurology Consultation Note  Consult Requested by: Maia Plan, MD  Reason for Consult: stroke  Consult Date: 10/31/22   The history was obtained from the pt and husband.  During history and examination, all items were  able to obtain unless otherwise noted.  History of Present Illness:  Hannah Mcdaniel is a 57 y.o. Caucasian female with PMH of right MCA CVA 03/2021. On plavix/aspirin but often forgets to take it. Follows with Neurology Silt Marlowe Kays, APP most recently for memory loss on 9/24. MRI ordered for this workup shows new CVA and sent to ER. Still on a loop recorder.   She feels like she is back to her baseline with left sided weakness. She walks with a cane.   tPA Given: No: out of window.  Past Medical History:  Diagnosis Date   Anxiety    Arterial occlusive disease Nov. 2014   Arthritis    Claudication of lower extremity [facility]) Nov. 2014   Right Lower Extremity rest pain   Colon polyps    Depression    Diabetes mellitus without complication (HCC)    GERD (gastroesophageal reflux disease)    Hyperlipidemia    Migraines    Vertigo     Past Surgical History:  Procedure Laterality Date   ABDOMINAL AORTAGRAM N/A 01/10/2013   Procedure: ABDOMINAL AORTAGRAM;  Surgeon: Larina Earthly, MD;  Location: [facility] CATH LAB;  Service: Cardiovascular;  Laterality: N/A;   ABDOMINAL AORTOGRAM W/LOWER EXTREMITY Bilateral 07/16/2018   Procedure: ABDOMINAL AORTOGRAM W/LOWER EXTREMITY;  Surgeon: Chuck Hint, MD;  Location: [facility] INVASIVE CV LAB;  Service: Cardiovascular;  Laterality: Bilateral;   ABDOMINAL AORTOGRAM W/LOWER EXTREMITY N/A 10/05/2020   Procedure: ABDOMINAL AORTOGRAM W/LOWER EXTREMITY;  Surgeon: Chuck Hint, MD;  Location: [facility] INVASIVE CV LAB;  Service: Cardiovascular;  Laterality: N/A;   BUBBLE STUDY  03/26/2021   Procedure: BUBBLE STUDY;  Surgeon: Little Ishikawa, MD;  Location: [facility] ENDOSCOPY;  Service: Cardiovascular;;   CHOLECYSTECTOMY      Gall Bladder   iliac artery angioplasty and stent placement  01/10/13   LOWER EXTREMITY ANGIOGRAM Bilateral 01/10/2013   Procedure: LOWER EXTREMITY ANGIOGRAM;  Surgeon: Larina Earthly, MD;  Location: [facility] CATH LAB;  Service: Cardiovascular;  Laterality: Bilateral;   PERCUTANEOUS STENT INTERVENTION Right 01/10/2013   Procedure: PERCUTANEOUS STENT INTERVENTION;  Surgeon: Larina Earthly, MD;  Location: [facility] CATH LAB;  Service: Cardiovascular;  Laterality: Right;  rt common iliac stent   PERIPHERAL VASCULAR CATHETERIZATION N/A 11/26/2015   Procedure: Abdominal Aortogram w/Lower Extremity;  Surgeon: Chuck Hint, MD;  Location: [facility] INVASIVE CV LAB;  Service: Cardiovascular;  Laterality: N/A;   PERIPHERAL VASCULAR CATHETERIZATION Right 11/26/2015   Procedure: Peripheral Vascular Balloon Angioplasty;  Surgeon: Chuck Hint, MD;  Location: [facility] INVASIVE CV LAB;  Service: Cardiovascular;  Laterality: Right;  rt common iliac   PERIPHERAL VASCULAR INTERVENTION  07/16/2018   Procedure: PERIPHERAL VASCULAR INTERVENTION;  Surgeon: Chuck Hint, MD;  Location: [facility] INVASIVE CV LAB;  Service: Cardiovascular;;   PERIPHERAL VASCULAR INTERVENTION Right 10/05/2020   Procedure: PERIPHERAL VASCULAR INTERVENTION;  Surgeon: Chuck Hint, MD;  Location: [facility] INVASIVE CV LAB;  Service: Cardiovascular;  Laterality: Right;  Common and exteral iliac artery   TEE WITHOUT CARDIOVERSION N/A 03/26/2021   Procedure: TRANSESOPHAGEAL ECHOCARDIOGRAM (TEE);  Surgeon: Little Ishikawa, MD;  Location: [facility] ENDOSCOPY;  Service: Cardiovascular;  Laterality: N/A;   TOTAL ABDOMINAL HYSTERECTOMY      Family History  Problem Relation  Age of Onset   Diabetes Mother    Hyperlipidemia Mother    Hypertension Mother    Varicose Veins Mother    COPD Mother    Stroke Mother    Anxiety disorder Mother    Depression Mother    Ovarian cancer Mother    Acute myelogenous leukemia Father    Diabetes Brother     Hypertension Brother    Hyperlipidemia Brother    Stroke Maternal Grandmother    Anxiety disorder Maternal Grandmother    Depression Maternal Grandmother    Breast cancer Maternal Grandmother    Diabetes Maternal Grandmother    Heart disease Maternal Grandfather    Lung cancer Paternal Grandfather    Other Brother        Blood disease   Anxiety disorder Daughter    Migraines Daughter    Migraines Daughter    Migraines Son    Seizures Son     Social History:  reports that she quit smoking about 19 months ago. Her smoking use included cigarettes. She started smoking about 45 years ago. She has a 11 pack-year smoking history. She has never used smokeless tobacco. She reports current alcohol use of about 1.0 standard drink of alcohol per week. She reports that she does not use drugs.  Allergies:  Allergies  Allergen Reactions   Crestor [Rosuvastatin] Other (See Comments)    Severe muscle pain   Asa [Aspirin] Rash    Can take low-dose aspirin.   Bempedoic Acid Other (See Comments)    Bilateral shoulder pain.   Codeine Rash   Penicillins Rash    Has patient had a PCN reaction causing immediate rash, facial/tongue/throat swelling, SOB or lightheadedness with hypotension:Yes Has patient had a PCN reaction causing severe rash involving mucus membranes or skin necrosis:Yes Has patient had a PCN reaction that required hospitalization:No Has patient had a PCN reaction occurring within the last 10 years:No If all of the above answers are "NO", then may proceed with Cephalosporin use.     No current facility-administered medications on file prior to encounter.   Current Outpatient Medications on File Prior to Encounter  Medication Sig Dispense Refill   Alirocumab (PRALUENT) 150 MG/ML SOAJ Inject 1 mL (150 mg total) into the skin every 14 (fourteen) days. 2 mL 2   clopidogrel (PLAVIX) 75 MG tablet Take 1 tablet (75 mg total) by mouth daily. 30 tablet 11   diclofenac Sodium (VOLTAREN) 1  % GEL Apply 4 g topically 4 (four) times daily. 4 g 2   empagliflozin (JARDIANCE) 10 MG TABS tablet Take 1 tablet (10 mg total) by mouth daily. 90 tablet 1   ezetimibe (ZETIA) 10 MG tablet Take 1 tablet (10 mg total) by mouth daily. 90 tablet 1   hydrochlorothiazide (HYDRODIURIL) 25 MG tablet Take 1 tablet (25 mg total) by mouth daily. 90 tablet 3   hydrOXYzine (VISTARIL) 25 MG capsule Take 1 capsule (25 mg total) by mouth every 6 (six) hours as needed. 270 capsule 1   levocetirizine (XYZAL) 5 MG tablet Take 1 tablet (5 mg total) by mouth every evening. 90 tablet 1   losartan (COZAAR) 50 MG tablet TAKE 2 TABLETS BY MOUTH EVERY DAY 180 tablet 0   mirtazapine (REMERON) 7.5 MG tablet TAKE 1 TABLET BY MOUTH AT BEDTIME. 90 tablet 0   montelukast (SINGULAIR) 10 MG tablet Take 1 tablet (10 mg total) by mouth at bedtime. 90 tablet 1   rOPINIRole (REQUIP) 0.25 MG tablet TAKE 1-2 TABLETS (0.25-0.5  MG TOTAL) BY MOUTH AT BEDTIME. 180 tablet 0   Vitamin D, Ergocalciferol, (DRISDOL) 1.25 MG (50000 UNIT) CAPS capsule Take 1 capsule (50,000 Units total) by mouth every 7 (seven) days for 12 doses. 12 capsule 0    Review of Systems: A full ROS was attempted today and was  able to be performed.  Systems assessed include - Constitutional, Eyes, HENT, Respiratory, Cardiovascular, Gastrointestinal, Genitourinary, Integument/breast, Hematologic/lymphatic, Musculoskeletal, Neurological, Behavioral/Psych, Endocrine, Allergic/Immunologic - with pertinent responses as per HPI.  Physical Examination: Temp:  [97.4 F (36.3 C)-97.5 F (36.4 C)] 97.4 F (36.3 C) (09/27 1142) Pulse Rate:  [52-62] 55 (09/27 1315) Resp:  [13-19] 14 (09/27 1315) BP: (140-178)/(64-86) 151/86 (09/27 1315) SpO2:  [95 %-99 %] 98 % (09/27 1315) Weight:  [61.2 kg] 61.2 kg (09/27 1116)  General - well nourished, well developed, in no apparent distress.    Ophthalmologic - fundi not visualized due to noncooperation.    Cardiovascular - regular  rhythm and rate  Mental Status -  Level of arousal and orientation to time, place, and person were intact. Got confused with her age but got year.  Language including expression, naming, repetition, comprehension, reading, and writing was assessed and found intact. Attention span and concentration were normal. Recent and remote memory were intact. Fund of Knowledge was assessed and was intact.  Cranial Nerves II - XII - II - Vision intact OU. III, IV, VI - Extraocular movements intact. V - Facial sensation intact bilaterally. VII - Facial movement intact bilaterally. VIII - Hearing & vestibular intact bilaterally. X - Palate elevates symmetrically. XI - Chin turning & shoulder shrug intact bilaterally. XII - Tongue protrusion intact.  Motor Strength - The patient's strength was normal in all extremities and pronator drift was absent except for left leg drift. 4+/5 strength left arm/leg.    Motor Tone & Bulk - Muscle tone was assessed at the neck and appendages and was normal.  Bulk was normal and fasciculations were absent.   Reflexes - The patient's reflexes were normal in all extremities and she had no pathological reflexes.  Sensory - Light touch, temperature/pinprick were assessed and were normal.    Coordination - The patient had normal movements in the hands and feet with no ataxia or dysmetria.  Tremor was absent.  Gait and Station - deferred  NIHSS: 1 for left leg drift.   Data Reviewed: MR Brain Wo Contrast  Result Date: 10/31/2022 CLINICAL DATA:  Provided history: Neuro deficit, acute, stroke suspected. Mental status change, unknown cause. EXAM: MRI HEAD WITHOUT CONTRAST TECHNIQUE: Multiplanar, multiecho pulse sequences of the brain and surrounding structures were obtained without intravenous contrast. COMPARISON:  Brain MRI 04/11/2021. CT angiogram head/neck 04/12/2021. FINDINGS: Brain: Moderate lateral and third ventriculomegaly, unchanged from the prior brain MRI of  04/11/2021. New from the prior MRI, there is a large region of cortical/subcortical T2 FLAIR hyperintense signal abnormality within the posterior right temporal lobe, right parietal lobe and right occipital lobe. There is a superimposed focus of restricted diffusion within this territory spanning 5.7 cm. The imaging appearance is most consistent with an acute on subacute posterior right MCA territory infarct. 4 mm acute white matter infarct at the left temporoparietal junction (series 3, image 80). Moderate-sized chronic cortical/subcortical left PCA territory infarct within the left temporal and occipital lobes, new from the prior MRI. Cortical laminar necrosis at this site. Redemonstrated chronic infarct within the right corona radiata/posterior limb of right internal capsule. Known chronic infarct within the left periatrial white  matter/posterior limb of left internal capsule. Background age advanced multifocal T2 FLAIR hyperintense signal abnormality within the cerebral white matter, nonspecific but compatible with chronic small vessel ischemic disease. Chronic lacunar infarct within the right thalamus, new from the prior MRI. Progressive T2 FLAIR hyperintense signal abnormality within the bilateral mid brain and pons, and right ventral aspect of the medulla, reflecting a combination of Wallerian degeneration and chronic small vessel ischemic disease. This includes new chronic infarcts within the central and right aspect of the pons. Chronic lunar infarct within the right middle cerebellar peduncle, new from the prior MRI. Two small chronic infarcts within the right cerebellar hemisphere, unchanged No evidence of an intracranial mass. No chronic intracranial blood products. No extra-axial fluid collection. No midline shift. Vascular: A flow void is poorly delineated within the distal intracranial right vertebral artery. Flow voids preserved elsewhere within the proximal large arterial vessels. Skull and upper  cervical spine: No focal suspicious marrow lesion. Sinuses/Orbits: No mass or acute finding within the imaged orbits. No significant paranasal sinus disease. Impressions #1 and #2 called by telephone at the time of interpretation on 10/31/2022 at 10:53 am to provider [facility] , who verbally acknowledged these results. Dr. Louanne Skye contacted the patient via telephone prior to the patient leaving the imaging center. Impression #5 will be called to the ordering clinician or representative by the Radiologist Assistant, and communication documented in the PACS or [employer]. IMPRESSION: 1. Large acute on subacute cortical/subcortical posterior right MCA territory infarct. 2. 4 mm acute white matter infarct at the left temporoparietal junction. 3. Background age advanced chronic small vessel ischemic disc disease and chronic infarcts, as detailed and progressed from the prior brain MRI of 04/11/2021. This includes new but chronic infarcts within the left temporo-occipital lobes, right thalamus, pons and right middle cerebellar peduncle. 4. Unchanged moderate lateral and third ventriculomegaly which is nonspecific, but could reflect normal pressure hydrocephalus in the appropriate clinical setting. 5. A flow void is poorly delineated within the distal intracranial right vertebral artery, suspicious for high-grade stenosis or vessel occlusion. Consider MR or CT angiography for further evaluation. Electronically Signed   By: Jackey Loge D.O.   On: 10/31/2022 11:00   CUP PACEART REMOTE DEVICE CHECK  Result Date: 10/02/2022 ILR summary report received. Battery status OK. Normal device function. No new symptom, tachy, brady, or pause episodes. No new AF episodes. Monthly summary reports and ROV/PRN LA, CVRS   Assessment: 57 y.o. female with recent Right MCA, b/l basal ganglia stroke in 03/2021 loop monitor in place. Taking plavix/aspirin intermittenly but mostly non compliant.  Now with new stroke on MRI  outp for memory loss in left temporal parietal junction.   Still smokes. Provided counseling to stop.   Stroke Risk Factors - HTN, smoking, recent strokes.  Plan: - HgbA1c, fasting lipid panel -loop monitor interrogation -CTA head/neck. - No need for Permissive HTN  - PT consult, OT consult, Speech consult -Start statin if LDL >70. - Echocardiogram - Carotid dopplers - Prophylactic therapy-Antiplatelet med: Aspirin - dose 81 and Antiplatelet med: Plavix - dose 75mg  - Risk factor modification - Telemetry monitoring - Frequent neuro checks  q4 hr neuro checks -- Stroke education - Amb referral to neurology upon discharge    - Stroke team to follow  Thank you for this consultation and allowing [place] to participate in the care of this patient.

## 2022-10-31 NOTE — ED Notes (Signed)
Pt CBG 135 

## 2022-10-31 NOTE — Telephone Encounter (Signed)
Pt's daughter Gearldine Bienenstock called in and left a message. She had her MRI today and were told she needed to be transported to the emergency room. She would like to see if Huntley Dec could look at the MRI and give them more information.

## 2022-10-31 NOTE — Telephone Encounter (Signed)
I advised to call PCP to find out more information. Due to ordering physician

## 2022-10-31 NOTE — Telephone Encounter (Signed)
Radiology called and gave an alert that it looks like she has a new or subacute CVA and patient was contacted and recommended to go to the emergency department.

## 2022-10-31 NOTE — ED Notes (Signed)
ED TO INPATIENT HANDOFF REPORT  ED Nurse Name and Phone #: Zadin Lange [phone]  S Name/Age/Gender Jiles Harold 57 y.o. female Room/Bed: 032C/032C  Code Status   Code Status: Limited: Do not attempt resuscitation (DNR) -DNR-LIMITED -Do Not Intubate/DNI   Home/SNF/Other Home Patient oriented to: self, place, time, and situation Is this baseline? Yes   Triage Complete: Triage complete  Chief Complaint Acute CVA (cerebrovascular accident) [facility]) [I63.9]  Triage Note Pt arrives via POV from [facility] where patient had outpt MRI showing new acute right MCA infarct. Pt reports symptoms began approx 3 weeks ago with new onset confusion. Getting lost while driving. Pt currently awake, alert, pt has residual left arm weakness from previous stroke. Pt also reports new left eye with blurred vision. Pt reports some aphasia which is new. Pt ambulatory with cane. Takes 81mg  ASA daily.    Allergies Allergies  Allergen Reactions   Crestor [Rosuvastatin] Other (See Comments)    Severe muscle pain   Asa [Aspirin] Rash    Can take low-dose aspirin.   Bempedoic Acid Other (See Comments)    Bilateral shoulder pain.   Codeine Rash   Penicillins Rash    Has patient had a PCN reaction causing immediate rash, facial/tongue/throat swelling, SOB or lightheadedness with hypotension:Yes Has patient had a PCN reaction causing severe rash involving mucus membranes or skin necrosis:Yes Has patient had a PCN reaction that required hospitalization:No Has patient had a PCN reaction occurring within the last 10 years:No If all of the above answers are "NO", then may proceed with Cephalosporin use.     Level of Care/Admitting Diagnosis ED Disposition     ED Disposition  Admit   Condition  --   Comment  Hospital Area: [facility] [[id number]]  Level of Care: Progressive [102]  Admit to Progressive based on following criteria: NEUROLOGICAL AND NEUROSURGICAL complex patients with  significant risk of instability, who do not meet ICU criteria, yet require close observation or frequent assessment (< / = every 2 - 4 hours) with medical / nursing intervention.  May admit patient to [facility] or [facility] if equivalent level of care is available:: No  Covid Evaluation: Asymptomatic - no recent exposure (last 10 days) testing not required  Diagnosis: Acute CVA (cerebrovascular accident) [facility]) [[id number]]  Admitting Physician: Lurline Del [[id number]]  Attending Physician: Lurline Del [[id number]]  Certification:: I certify this patient will need inpatient services for at least 2 midnights  Expected Medical Readiness: 11/03/2022          B Medical/Surgery History Past Medical History:  Diagnosis Date   Anxiety    Arterial occlusive disease Nov. 2014   Arthritis    Claudication of lower extremity (HCC) Nov. 2014   Right Lower Extremity rest pain   Colon polyps    Depression    Diabetes mellitus without complication (HCC)    GERD (gastroesophageal reflux disease)    Hyperlipidemia    Migraines    Vertigo    Past Surgical History:  Procedure Laterality Date   ABDOMINAL AORTAGRAM N/A 01/10/2013   Procedure: ABDOMINAL AORTAGRAM;  Surgeon: Larina Earthly, MD;  Location: [facility] CATH LAB;  Service: Cardiovascular;  Laterality: N/A;   ABDOMINAL AORTOGRAM W/LOWER EXTREMITY Bilateral 07/16/2018   Procedure: ABDOMINAL AORTOGRAM W/LOWER EXTREMITY;  Surgeon: Chuck Hint, MD;  Location: [facility] INVASIVE CV LAB;  Service: Cardiovascular;  Laterality: Bilateral;   ABDOMINAL AORTOGRAM W/LOWER EXTREMITY N/A 10/05/2020   Procedure: ABDOMINAL AORTOGRAM W/LOWER EXTREMITY;  Surgeon: Chuck Hint, MD;  Location: [facility] INVASIVE CV LAB;  Service: Cardiovascular;  Laterality: N/A;   BUBBLE STUDY  03/26/2021   Procedure: BUBBLE STUDY;  Surgeon: Little Ishikawa, MD;  Location: [facility] ENDOSCOPY;  Service: Cardiovascular;;   CHOLECYSTECTOMY     Gall Bladder   iliac artery  angioplasty and stent placement  01/10/13   LOWER EXTREMITY ANGIOGRAM Bilateral 01/10/2013   Procedure: LOWER EXTREMITY ANGIOGRAM;  Surgeon: Larina Earthly, MD;  Location: [facility] CATH LAB;  Service: Cardiovascular;  Laterality: Bilateral;   PERCUTANEOUS STENT INTERVENTION Right 01/10/2013   Procedure: PERCUTANEOUS STENT INTERVENTION;  Surgeon: Larina Earthly, MD;  Location: [facility] CATH LAB;  Service: Cardiovascular;  Laterality: Right;  rt common iliac stent   PERIPHERAL VASCULAR CATHETERIZATION N/A 11/26/2015   Procedure: Abdominal Aortogram w/Lower Extremity;  Surgeon: Chuck Hint, MD;  Location: [facility] INVASIVE CV LAB;  Service: Cardiovascular;  Laterality: N/A;   PERIPHERAL VASCULAR CATHETERIZATION Right 11/26/2015   Procedure: Peripheral Vascular Balloon Angioplasty;  Surgeon: Chuck Hint, MD;  Location: [facility] INVASIVE CV LAB;  Service: Cardiovascular;  Laterality: Right;  rt common iliac   PERIPHERAL VASCULAR INTERVENTION  07/16/2018   Procedure: PERIPHERAL VASCULAR INTERVENTION;  Surgeon: Chuck Hint, MD;  Location: [facility] INVASIVE CV LAB;  Service: Cardiovascular;;   PERIPHERAL VASCULAR INTERVENTION Right 10/05/2020   Procedure: PERIPHERAL VASCULAR INTERVENTION;  Surgeon: Chuck Hint, MD;  Location: [facility] INVASIVE CV LAB;  Service: Cardiovascular;  Laterality: Right;  Common and exteral iliac artery   TEE WITHOUT CARDIOVERSION N/A 03/26/2021   Procedure: TRANSESOPHAGEAL ECHOCARDIOGRAM (TEE);  Surgeon: Little Ishikawa, MD;  Location: [facility] ENDOSCOPY;  Service: Cardiovascular;  Laterality: N/A;   TOTAL ABDOMINAL HYSTERECTOMY       A IV Location/Drains/Wounds Patient Lines/Drains/Airways Status     Active Line/Drains/Airways     Name Placement date Placement time Site Days   Peripheral IV 03/25/21 18 G 1" Anterior;Left;Proximal Forearm 03/25/21  1310  Forearm  585   Peripheral IV 04/11/21 20 G Right Antecubital 04/11/21  1320  Antecubital  568   Peripheral IV 10/31/22  20 G Anterior;Left Forearm 10/31/22  1228  Forearm  less than 1            Intake/Output Last 24 hours No intake or output data in the 24 hours ending 10/31/22 1610  Labs/Imaging Results for orders placed or performed during the hospital encounter of 10/31/22 (from the past 48 hour(s))  Protime-INR     Status: None   Collection Time: 10/31/22 11:18 AM  Result Value Ref Range   Prothrombin Time 12.7 11.4 - 15.2 seconds   INR 0.9 0.8 - 1.2    Comment: (NOTE) INR goal varies based on device and disease states. Performed at [facility], [address], [place], [place] [address]   APTT     Status: None   Collection Time: 10/31/22 11:18 AM  Result Value Ref Range   aPTT 27 24 - 36 seconds    Comment: Performed at [facility], [address], [place], [place] [address]  CBC     Status: Abnormal   Collection Time: 10/31/22 11:18 AM  Result Value Ref Range   WBC 8.3 4.0 - 10.5 K/uL   RBC 4.90 3.87 - 5.11 MIL/uL   Hemoglobin 14.8 12.0 - 15.0 g/dL   HCT 14.7 82.9 - 56.2 %   MCV 89.2 80.0 - 100.0 fL   MCH 30.2 26.0 - 34.0 pg   MCHC  33.9 30.0 - 36.0 g/dL   RDW 29.5 (L) 62.1 - 30.8 %   Platelets 268 150 - 400 K/uL   nRBC 0.0 0.0 - 0.2 %    Comment: Performed at [facility], [address] [place], [place] [address]  Differential     Status: None   Collection Time: 10/31/22 11:18 AM  Result Value Ref Range   Neutrophils Relative % 52 %   Neutro Abs 4.4 1.7 - 7.7 K/uL   Lymphocytes Relative 41 %   Lymphs Abs 3.4 0.7 - 4.0 K/uL   Monocytes Relative 5 %   Monocytes Absolute 0.4 0.1 - 1.0 K/uL   Eosinophils Relative 1 %   Eosinophils Absolute 0.1 0.0 - 0.5 K/uL   Basophils Relative 1 %   Basophils Absolute 0.1 0.0 - 0.1 K/uL   Immature Granulocytes 0 %   Abs Immature Granulocytes 0.01 0.00 - 0.07 K/uL    Comment: Performed at [facility], [address], [place], [place] [address]  Comprehensive metabolic panel     Status: Abnormal   Collection  Time: 10/31/22 11:18 AM  Result Value Ref Range   Sodium 135 135 - 145 mmol/L   Potassium 3.4 (L) 3.5 - 5.1 mmol/L   Chloride 100 98 - 111 mmol/L   CO2 25 22 - 32 mmol/L   Glucose, Bld 155 (H) 70 - 99 mg/dL    Comment: Glucose reference range applies only to samples taken after fasting for at least 8 hours.   BUN 16 6 - 20 mg/dL   Creatinine, Ser 5.28 (H) 0.44 - 1.00 mg/dL   Calcium 9.0 8.9 - 41.3 mg/dL   Total Protein 6.7 6.5 - 8.1 g/dL   Albumin 3.8 3.5 - 5.0 g/dL   AST 26 15 - 41 U/L   ALT 18 0 - 44 U/L   Alkaline Phosphatase 101 38 - 126 U/L   Total Bilirubin 0.6 0.3 - 1.2 mg/dL   GFR, Estimated 60 (L) >60 mL/min    Comment: (NOTE) Calculated using the CKD-EPI Creatinine Equation (2021)    Anion gap 10 5 - 15    Comment: Performed at [facility], [address], [place], [place] [address]  Ethanol     Status: None   Collection Time: 10/31/22 11:18 AM  Result Value Ref Range   Alcohol, Ethyl (B) <10 <10 mg/dL    Comment: (NOTE) Lowest detectable limit for serum alcohol is 10 mg/dL.  For medical purposes only. Performed at [facility], [address], [place], [place] [address]   Hemoglobin A1c     Status: Abnormal   Collection Time: 10/31/22 11:18 AM  Result Value Ref Range   Hgb A1c MFr Bld 6.4 (H) 4.8 - 5.6 %    Comment: (NOTE) Pre diabetes:          5.7%-6.4%  Diabetes:              >6.4%  Glycemic control for   <7.0% adults with diabetes    Mean Plasma Glucose 136.98 mg/dL    Comment: Performed at [facility], [address], [place], [place] [address]  I-stat chem 8, ED     Status: Abnormal   Collection Time: 10/31/22 11:34 AM  Result Value Ref Range   Sodium 136 135 - 145 mmol/L   Potassium 3.5 3.5 - 5.1 mmol/L   Chloride 100 98 - 111 mmol/L   BUN 19 6 - 20  mg/dL   Creatinine, Ser 1.61 0.44 - 1.00 mg/dL   Glucose, Bld 096 (H) 70 - 99 mg/dL    Comment: Glucose reference range applies only to samples taken after fasting for at least  8 hours.   Calcium, Ion 1.16 1.15 - 1.40 mmol/L   TCO2 25 22 - 32 mmol/L   Hemoglobin 15.3 (H) 12.0 - 15.0 g/dL   HCT 04.5 40.9 - 81.1 %  CBG monitoring, ED     Status: Abnormal   Collection Time: 10/31/22 11:44 AM  Result Value Ref Range   Glucose-Capillary 135 (H) 70 - 99 mg/dL    Comment: Glucose reference range applies only to samples taken after fasting for at least 8 hours.   CT ANGIO HEAD NECK W WO CM  Result Date: 10/31/2022 CLINICAL DATA:  Right MCA infarcts seen on MRI EXAM: CT ANGIOGRAPHY HEAD AND NECK WITH AND WITHOUT CONTRAST TECHNIQUE: Multidetector CT imaging of the head and neck was performed using the standard protocol during bolus administration of intravenous contrast. Multiplanar CT image reconstructions and MIPs were obtained to evaluate the vascular anatomy. Carotid stenosis measurements (when applicable) are obtained utilizing NASCET criteria, using the distal internal carotid diameter as the denominator. RADIATION DOSE REDUCTION: This exam was performed according to the departmental dose-optimization program which includes automated exposure control, adjustment of the mA and/or kV according to patient size and/or use of iterative reconstruction technique. CONTRAST:  80mL OMNIPAQUE IOHEXOL 350 MG/ML SOLN COMPARISON:  Same day brain MRI, CTA head and neck 04/12/2021 FINDINGS: CT HEAD FINDINGS Brain: Again seen is an acute on subacute infarct in the posterior right MCA distribution as seen on same-day MRI. Curvilinear hyperdensity overlying the infarct territory is favored to reflect cortical laminar necrosis as opposed to acute blood given lack of susceptibility artifact on the MRI. There is no evidence of malignant hemorrhagic transformation. There is background parenchymal volume loss with prominence of the ventricular system and extra-axial CSF spaces. There are remote cortical infarcts in the left medial temporal lobe and left occipital lobe and additional remote infarcts in  the brainstem and right cerebellar hemisphere. The pituitary and suprasellar region are normal. There is no solid mass lesion. There is no mass effect or midline shift. Vascular: See below. Skull: Normal. Negative for fracture or focal lesion. Sinuses/Orbits: The paranasal sinuses are clear. The globes and orbits are unremarkable. Other: The mastoid air cells and middle ear cavities are clear. Review of the MIP images confirms the above findings CTA NECK FINDINGS Aortic arch: There is mild calcified plaque in the imaged aortic arch. The origins of the major branch vessels are patent. The subclavian arteries are patent to the level imaged. Right carotid system: The right common, internal, and external carotid arteries are patent, with mild plaque at the bifurcation but no hemodynamically significant stenosis or occlusion. There is no evidence of dissection or aneurysm. Left carotid system: The left common, internal, and external carotid arteries are patent, with no hemodynamically significant stenosis or occlusion. There is no evidence of dissection or aneurysm. Vertebral arteries: The vertebral arteries are patent, without hemodynamically significant stenosis or occlusion there is no evidence of dissection or aneurysm. Skeleton: There is no acute osseous abnormality or suspicious osseous lesion. There is no visible canal hematoma. Other neck: There is extensive carious dental disease. The soft tissues of the neck are unremarkable. Upper chest: The imaged lung apices are clear. Review of the MIP images confirms the above findings CTA HEAD FINDINGS Anterior circulation: There is  mild calcified plaque in the intracranial ICAs without significant stenosis or occlusion. The bilateral MCAs and ACAS are patent, without proximal stenosis or occlusion. The anterior communicating artery is normal. There is no aneurysm or AVM. Posterior circulation: The right V4 segment is diminutive after the PICA origin which appears  progressed since the prior CTA. The dominant left V4 segment is widely patent. The basilar artery is patent. The major cerebellar arteries appear patent. The left P1 segment is markedly stenotic with diminutive enhancement throughout the remainder of the PCA which correlates with the infarct in the distribution. There is multifocal moderate to severe stenosis of the right P1 and P2 segments the distal branches are identified. These findings are progressed since the prior CTA. There is no aneurysm or AVM. Venous sinuses: Patent. Anatomic variants: None. Review of the MIP images confirms the above findings IMPRESSION: 1. Acute on subacute right MCA distribution infarct as seen on same day MRI with associated cortical laminar necrosis. 2. No corresponding high-grade MCA stenosis or occlusion. 3. Diminutive caliber of the right V4 segment after the PICA origin, marked left P1 stenosis and diminutive enhancement distally, and multifocal moderate to severe stenosis of the right P1 and P2 segments are all progressed since the prior CTA from 04/12/2021. 4. Mild plaque at the carotid bifurcations without significant stenosis or occlusion. Electronically Signed   By: Lesia Hausen M.D.   On: 10/31/2022 15:49   MR Brain Wo Contrast  Result Date: 10/31/2022 CLINICAL DATA:  Provided history: Neuro deficit, acute, stroke suspected. Mental status change, unknown cause. EXAM: MRI HEAD WITHOUT CONTRAST TECHNIQUE: Multiplanar, multiecho pulse sequences of the brain and surrounding structures were obtained without intravenous contrast. COMPARISON:  Brain MRI 04/11/2021. CT angiogram head/neck 04/12/2021. FINDINGS: Brain: Moderate lateral and third ventriculomegaly, unchanged from the prior brain MRI of 04/11/2021. New from the prior MRI, there is a large region of cortical/subcortical T2 FLAIR hyperintense signal abnormality within the posterior right temporal lobe, right parietal lobe and right occipital lobe. There is a  superimposed focus of restricted diffusion within this territory spanning 5.7 cm. The imaging appearance is most consistent with an acute on subacute posterior right MCA territory infarct. 4 mm acute white matter infarct at the left temporoparietal junction (series 3, image 80). Moderate-sized chronic cortical/subcortical left PCA territory infarct within the left temporal and occipital lobes, new from the prior MRI. Cortical laminar necrosis at this site. Redemonstrated chronic infarct within the right corona radiata/posterior limb of right internal capsule. Known chronic infarct within the left periatrial white matter/posterior limb of left internal capsule. Background age advanced multifocal T2 FLAIR hyperintense signal abnormality within the cerebral white matter, nonspecific but compatible with chronic small vessel ischemic disease. Chronic lacunar infarct within the right thalamus, new from the prior MRI. Progressive T2 FLAIR hyperintense signal abnormality within the bilateral mid brain and pons, and right ventral aspect of the medulla, reflecting a combination of Wallerian degeneration and chronic small vessel ischemic disease. This includes new chronic infarcts within the central and right aspect of the pons. Chronic lunar infarct within the right middle cerebellar peduncle, new from the prior MRI. Two small chronic infarcts within the right cerebellar hemisphere, unchanged No evidence of an intracranial mass. No chronic intracranial blood products. No extra-axial fluid collection. No midline shift. Vascular: A flow void is poorly delineated within the distal intracranial right vertebral artery. Flow voids preserved elsewhere within the proximal large arterial vessels. Skull and upper cervical spine: No focal suspicious marrow lesion. Sinuses/Orbits: No  mass or acute finding within the imaged orbits. No significant paranasal sinus disease. Impressions #1 and #2 called by telephone at the time of  interpretation on 10/31/2022 at 10:53 am to provider [facility] , who verbally acknowledged these results. Dr. Louanne Skye contacted the patient via telephone prior to the patient leaving the imaging center. Impression #5 will be called to the ordering clinician or representative by the Radiologist Assistant, and communication documented in the PACS or [employer]. IMPRESSION: 1. Large acute on subacute cortical/subcortical posterior right MCA territory infarct. 2. 4 mm acute white matter infarct at the left temporoparietal junction. 3. Background age advanced chronic small vessel ischemic disc disease and chronic infarcts, as detailed and progressed from the prior brain MRI of 04/11/2021. This includes new but chronic infarcts within the left temporo-occipital lobes, right thalamus, pons and right middle cerebellar peduncle. 4. Unchanged moderate lateral and third ventriculomegaly which is nonspecific, but could reflect normal pressure hydrocephalus in the appropriate clinical setting. 5. A flow void is poorly delineated within the distal intracranial right vertebral artery, suspicious for high-grade stenosis or vessel occlusion. Consider MR or CT angiography for further evaluation. Electronically Signed   By: Jackey Loge D.O.   On: 10/31/2022 11:00    Pending Labs Unresulted Labs (From admission, onward)     Start     Ordered   11/01/22 0500  Lipid panel  (Labs)  Tomorrow morning,   R       Comments: Fasting    10/31/22 1439   10/31/22 1600  HIV Antibody (routine testing w rflx)  (HIV Antibody (Routine testing w reflex) panel)  Once,   R        10/31/22 1603   10/31/22 1600  CBC  (heparin)  Once,   R       Comments: Baseline for heparin therapy IF NOT ALREADY DRAWN.  Notify MD if PLT < 100 K.    10/31/22 1603   10/31/22 1600  Creatinine, serum  (heparin)  Once,   R       Comments: Baseline for heparin therapy IF NOT ALREADY DRAWN.    10/31/22 1603            Vitals/Pain Today's  Vitals   10/31/22 1300 10/31/22 1315 10/31/22 1513 10/31/22 1534  BP: (!) 152/70 (!) 151/86  (!) 155/73  Pulse: (!) 53 (!) 55 60 62  Resp: 17 14 17 12   Temp:    97.9 F (36.6 C)  TempSrc:    Oral  SpO2: 99% 98% 98% 98%  Weight:      Height:      PainSc:        Isolation Precautions No active isolations  Medications Medications  sodium chloride flush (NS) 0.9 % injection 3 mL (has no administration in time range)   stroke: early stages of recovery book (has no administration in time range)  clopidogrel (PLAVIX) tablet 75 mg (75 mg Oral Given 10/31/22 1511)  aspirin EC tablet 81 mg (81 mg Oral Given 10/31/22 1512)   stroke: early stages of recovery book (has no administration in time range)  0.9 %  sodium chloride infusion (has no administration in time range)  acetaminophen (TYLENOL) tablet 650 mg (has no administration in time range)    Or  acetaminophen (TYLENOL) 160 MG/5ML solution 650 mg (has no administration in time range)    Or  acetaminophen (TYLENOL) suppository 650 mg (has no administration in time range)  senna-docusate (Senokot-S) tablet 1 tablet (has no  administration in time range)  heparin injection 5,000 Units (has no administration in time range)  insulin aspart (novoLOG) injection 0-9 Units (has no administration in time range)  iohexol (OMNIPAQUE) 350 MG/ML injection 80 mL (80 mLs Intravenous Contrast Given 10/31/22 1326)    Mobility walks     Focused Assessments Neuro Assessment Handoff:  Swallow screen pass? Yes    NIH Stroke Scale  Dizziness Present: No Headache Present: No Level of Consciousness (1a.)   : Alert, keenly responsive LOC Questions (1b. )   : Answers one question correctly LOC Commands (1c. )   : Performs both tasks correctly Best Gaze (2. )  : Normal Visual (3. )  : No visual loss Facial Palsy (4. )    : Normal symmetrical movements Motor Arm, Left (5a. )   : No drift Motor Arm, Right (5b. ) : No drift Motor Leg, Left (6a. )  :  No drift Motor Leg, Right (6b. ) : No drift Limb Ataxia (7. ): Absent Sensory (8. )  : Normal, no sensory loss Best Language (9. )  : No aphasia Dysarthria (10. ): Normal Extinction/Inattention (11.)   : No Abnormality Complete NIHSS TOTAL: 1     Neuro Assessment: Exceptions to WDL Neuro Checks:      Has TPA been given? No If patient is a Neuro Trauma and patient is going to OR before floor call report to 4N Charge nurse: [phone] or [phone]   R Recommendations: See Admitting Provider Note  Report given to:   Additional Notes:

## 2022-10-31 NOTE — ED Provider Notes (Signed)
Emergency Department Provider Note   I have reviewed the triage vital signs and the nursing notes.   HISTORY  Chief Complaint Cerebrovascular Accident   HPI Hannah Mcdaniel is a 57 y.o. female with PMH of prior CVA, HLD, and HTN presents to the emergency department for evaluation after acute stroke was found on outpatient MRI.  She has developed worsening memory symptoms over the past several weeks.  She has been driving and getting lost, having to have family come and pick her up.  She describes baseline weakness in her left arm and leg from a prior stroke and feels this might be slightly worse.  She has had some intermittent blurry vision in the left eye over the past couple of weeks but no symptoms currently.  She is compliant with her home medications including 81 mg aspirin and Plavix.    Past Medical History:  Diagnosis Date   Anxiety    Arterial occlusive disease Nov. 2014   Arthritis    Claudication of lower extremity (HCC) Nov. 2014   Right Lower Extremity rest pain   Colon polyps    Depression    Diabetes mellitus without complication (HCC)    GERD (gastroesophageal reflux disease)    Hyperlipidemia    Migraines    Vertigo     Review of Systems  Constitutional: No fever/chills Cardiovascular: Denies chest pain. Respiratory: Denies shortness of breath. Gastrointestinal: No abdominal pain.  No nausea, no vomiting.  Musculoskeletal: Negative for back pain. Skin: Negative for rash. Neurological: Negative for headaches. Positive memory difficult    ____________________________________________   PHYSICAL EXAM:  VITAL SIGNS: ED Triage Vitals  Encounter Vitals Group     BP 10/31/22 1116 (!) 178/85     Pulse Rate 10/31/22 1116 (!) 57     Resp 10/31/22 1116 18     Temp 10/31/22 1116 (!) 97.5 F (36.4 C)     Temp Source 10/31/22 1116 Oral     SpO2 10/31/22 1116 95 %     Weight 10/31/22 1116 135 lb (61.2 kg)     Height 10/31/22 1115 5\' 1"  (1.549 m)    Constitutional: Alert and oriented. Well appearing and in no acute distress. Eyes: Conjunctivae are normal.  Head: Atraumatic. Nose: No congestion/rhinnorhea. Mouth/Throat: Mucous membranes are moist. Neck: No stridor.   Cardiovascular: Normal rate, regular rhythm. Good peripheral circulation. Grossly normal heart sounds.   Respiratory: Normal respiratory effort.  No retractions. Lungs CTAB. Gastrointestinal: Soft and nontender. No distention.  Musculoskeletal: No lower extremity tenderness nor edema. No gross deformities of extremities. Neurologic:  Normal speech and language. 4+/5 strength in the LUE and LLE compared to full strength on the right. No facial asymmetry. No pronator drift.  Skin:  Skin is warm, dry and intact. No rash noted.   ____________________________________________   LABS (all labs ordered are listed, but only abnormal results are displayed)  Labs Reviewed  CBC - Abnormal; Notable for the following components:      Result Value   RDW 11.3 (*)    All other components within normal limits  COMPREHENSIVE METABOLIC PANEL - Abnormal; Notable for the following components:   Potassium 3.4 (*)    Glucose, Bld 155 (*)    Creatinine, Ser 1.08 (*)    GFR, Estimated 60 (*)    All other components within normal limits  I-STAT CHEM 8, ED - Abnormal; Notable for the following components:   Glucose, Bld 150 (*)    Hemoglobin 15.3 (*)  All other components within normal limits  CBG MONITORING, ED - Abnormal; Notable for the following components:   Glucose-Capillary 135 (*)    All other components within normal limits  PROTIME-INR  APTT  DIFFERENTIAL  ETHANOL  HEMOGLOBIN A1C   ____________________________________________  EKG   EKG Interpretation Date/Time:  Friday October 31 2022 11:21:45 EDT Ventricular Rate:  69 PR Interval:  180 QRS Duration:  84 QT Interval:  426 QTC Calculation: 456 R Axis:   57  Text Interpretation: Normal sinus rhythm T wave  abnormality, consider inferior ischemia T wave abnormality, consider anterolateral ischemia Abnormal ECG When compared with ECG of 11-Apr-2021 13:34, PREVIOUS ECG IS PRESENT Confirmed by Alona Bene [id number]) on 10/31/2022 1:03:14 PM        ____________________________________________  RADIOLOGY  MR Brain Wo Contrast  Result Date: 10/31/2022 CLINICAL DATA:  Provided history: Neuro deficit, acute, stroke suspected. Mental status change, unknown cause. EXAM: MRI HEAD WITHOUT CONTRAST TECHNIQUE: Multiplanar, multiecho pulse sequences of the brain and surrounding structures were obtained without intravenous contrast. COMPARISON:  Brain MRI 04/11/2021. CT angiogram head/neck 04/12/2021. FINDINGS: Brain: Moderate lateral and third ventriculomegaly, unchanged from the prior brain MRI of 04/11/2021. New from the prior MRI, there is a large region of cortical/subcortical T2 FLAIR hyperintense signal abnormality within the posterior right temporal lobe, right parietal lobe and right occipital lobe. There is a superimposed focus of restricted diffusion within this territory spanning 5.7 cm. The imaging appearance is most consistent with an acute on subacute posterior right MCA territory infarct. 4 mm acute white matter infarct at the left temporoparietal junction (series 3, image 80). Moderate-sized chronic cortical/subcortical left PCA territory infarct within the left temporal and occipital lobes, new from the prior MRI. Cortical laminar necrosis at this site. Redemonstrated chronic infarct within the right corona radiata/posterior limb of right internal capsule. Known chronic infarct within the left periatrial white matter/posterior limb of left internal capsule. Background age advanced multifocal T2 FLAIR hyperintense signal abnormality within the cerebral white matter, nonspecific but compatible with chronic small vessel ischemic disease. Chronic lacunar infarct within the right thalamus, new from the prior MRI.  Progressive T2 FLAIR hyperintense signal abnormality within the bilateral mid brain and pons, and right ventral aspect of the medulla, reflecting a combination of Wallerian degeneration and chronic small vessel ischemic disease. This includes new chronic infarcts within the central and right aspect of the pons. Chronic lunar infarct within the right middle cerebellar peduncle, new from the prior MRI. Two small chronic infarcts within the right cerebellar hemisphere, unchanged No evidence of an intracranial mass. No chronic intracranial blood products. No extra-axial fluid collection. No midline shift. Vascular: A flow void is poorly delineated within the distal intracranial right vertebral artery. Flow voids preserved elsewhere within the proximal large arterial vessels. Skull and upper cervical spine: No focal suspicious marrow lesion. Sinuses/Orbits: No mass or acute finding within the imaged orbits. No significant paranasal sinus disease. Impressions #1 and #2 called by telephone at the time of interpretation on 10/31/2022 at 10:53 am to provider [facility] , who verbally acknowledged these results. Dr. Louanne Skye contacted the patient via telephone prior to the patient leaving the imaging center. Impression #5 will be called to the ordering clinician or representative by the Radiologist Assistant, and communication documented in the PACS or [employer]. IMPRESSION: 1. Large acute on subacute cortical/subcortical posterior right MCA territory infarct. 2. 4 mm acute white matter infarct at the left temporoparietal junction. 3. Background age advanced chronic small vessel  ischemic disc disease and chronic infarcts, as detailed and progressed from the prior brain MRI of 04/11/2021. This includes new but chronic infarcts within the left temporo-occipital lobes, right thalamus, pons and right middle cerebellar peduncle. 4. Unchanged moderate lateral and third ventriculomegaly which is nonspecific, but could  reflect normal pressure hydrocephalus in the appropriate clinical setting. 5. A flow void is poorly delineated within the distal intracranial right vertebral artery, suspicious for high-grade stenosis or vessel occlusion. Consider MR or CT angiography for further evaluation. Electronically Signed   By: Jackey Loge D.O.   On: 10/31/2022 11:00    ____________________________________________   PROCEDURES  Procedure(s) performed:   Procedures  None  ____________________________________________   INITIAL IMPRESSION / ASSESSMENT AND PLAN / ED COURSE  Pertinent labs & imaging results that were available during my care of the patient were reviewed by me and considered in my medical decision making (see chart for details).   This patient is Presenting for Evaluation of CVA symptoms, which does require a range of treatment options, and is a complaint that involves a high risk of morbidity and mortality.  The Differential Diagnoses includes but is not exclusive to alcohol, illicit or prescription medications, intracranial pathology such as stroke, intracerebral hemorrhage, fever or infectious causes including sepsis, hypoxemia, uremia, trauma, endocrine related disorders such as diabetes, hypoglycemia, thyroid-related diseases, etc.   Critical Interventions-    Medications  sodium chloride flush (NS) 0.9 % injection 3 mL (has no administration in time range)   stroke: early stages of recovery book (has no administration in time range)  clopidogrel (PLAVIX) tablet 75 mg (75 mg Oral Given 10/31/22 1511)  aspirin EC tablet 81 mg (81 mg Oral Given 10/31/22 1512)  iohexol (OMNIPAQUE) 350 MG/ML injection 80 mL (80 mLs Intravenous Contrast Given 10/31/22 1326)    Reassessment after intervention: no new CVA symptoms.    I did obtain Additional Historical Information from husband at bedside.   I decided to review pertinent External Data, and in summary patient with outpatient MRI performed today  with acute infarct noted.  Clinical Laboratory Tests Ordered, included no acute kidney injury or severe electrolyte disturbance.  Mild hyperglycemia without DKA.  No leukocytosis.  Radiologic Tests Ordered, included MRI brain. I independently interpreted the images and agree with radiology interpretation.   Cardiac Monitor Tracing which shows NSR.    Social Determinants of Health Risk patient continues to smoke cigarettes but has cut back some.   Consult complete with Neurology, Dr. Viviann Spare. Plan for CTA head/neck and admit. They will consult.   TRH. Plan for admit.   Medical Decision Making: Summary:  Patient presents emergency with acute stroke on outpatient MRI.  No acute neurodeficits that would activate CODE STROKE, although considered. Plan for CTA head/neck and Neurology consultation.   Reevaluation with update and discussion with patient. Plan for admit. She and husband are in agreement with mgmt plan.   Patient's presentation is most consistent with acute presentation with potential threat to life or bodily function.   Disposition: admit  ____________________________________________  FINAL CLINICAL IMPRESSION(S) / ED DIAGNOSES  Final diagnoses:  Cerebral infarction due to embolism of cerebral artery [facility])    Note:  This document was prepared using Dragon voice recognition software and may include unintentional dictation errors.  Alona Bene, MD, [facility] Emergency Medicine    Astraea Gaughran, Arlyss Repress, MD 10/31/22 [phone]

## 2022-10-31 NOTE — H&P (Addendum)
History and Physical    Hannah Mcdaniel:[id number] DOB: [birth year] DOA: 10/31/2022  PCP: Arrie Senate, FNP  Patient coming from: home  I have personally briefly reviewed patient's old medical records in [facility]  Chief Complaint: cva on out patient MRI  HPI: Hannah Mcdaniel is a 57 y.o. female with medical history significant of   hypertension, hyperlipidemia, DM2, Vit D deficiency, history of migraines, depression, PVD, prior cryptogenic CVA on 03/2021 on Plavix  who  was seen by neurology on 9/24 outpatient for evaluation for memory impairment. Per patient around 2 weeks ago she began getting lost while driving ,she also noted intermittent vision changes. In addition during neurology visit she was noted have word finding difficulties. Patient does admit to continued tobacco abuse as well as being noncompliant with her aspirin and plavix. IN any event patient was sent for MRI and was noted have finding of acute to subacute cva. Patient currently notes that she feels back to baseline has no new weakness, paresthesias, vision changes but still notes some memory deficit.  ED Course:  Afeb, bp 178/85, hr 57, rr 18, sat 95% on ra  Wbc 8.3, hgb 14.8, plt 268 Na 135, K 3.4, glu 155, cr 1.08 A1C: 6.4 EKG: NSR diffuse  twave abnormality unchanged from EKG 04/2021 CTA head and neck IMPRESSION: 1. Acute on subacute right MCA distribution infarct as seen on same day MRI with associated cortical laminar necrosis. 2. No corresponding high-grade MCA stenosis or occlusion. 3. Diminutive caliber of the right V4 segment after the PICA origin, marked left P1 stenosis and diminutive enhancement distally, and multifocal moderate to severe stenosis of the right P1 and P2 segments are all progressed since the prior CTA from 04/12/2021. 4. Mild plaque at the carotid bifurcations without significant stenosis or occlusion.  Tx plavix , asa Review of Systems: As per HPI otherwise 10  point review of systems negative.   Past Medical History:  Diagnosis Date   Anxiety    Arterial occlusive disease Nov. 2014   Arthritis    Claudication of lower extremity [facility]) Nov. 2014   Right Lower Extremity rest pain   Colon polyps    Depression    Diabetes mellitus without complication (HCC)    GERD (gastroesophageal reflux disease)    Hyperlipidemia    Migraines    Vertigo     Past Surgical History:  Procedure Laterality Date   ABDOMINAL AORTAGRAM N/A 01/10/2013   Procedure: ABDOMINAL AORTAGRAM;  Surgeon: Larina Earthly, MD;  Location: [facility] CATH LAB;  Service: Cardiovascular;  Laterality: N/A;   ABDOMINAL AORTOGRAM W/LOWER EXTREMITY Bilateral 07/16/2018   Procedure: ABDOMINAL AORTOGRAM W/LOWER EXTREMITY;  Surgeon: Chuck Hint, MD;  Location: [facility] INVASIVE CV LAB;  Service: Cardiovascular;  Laterality: Bilateral;   ABDOMINAL AORTOGRAM W/LOWER EXTREMITY N/A 10/05/2020   Procedure: ABDOMINAL AORTOGRAM W/LOWER EXTREMITY;  Surgeon: Chuck Hint, MD;  Location: [facility] INVASIVE CV LAB;  Service: Cardiovascular;  Laterality: N/A;   BUBBLE STUDY  03/26/2021   Procedure: BUBBLE STUDY;  Surgeon: Little Ishikawa, MD;  Location: [facility] ENDOSCOPY;  Service: Cardiovascular;;   CHOLECYSTECTOMY     Gall Bladder   iliac artery angioplasty and stent placement  01/10/13   LOWER EXTREMITY ANGIOGRAM Bilateral 01/10/2013   Procedure: LOWER EXTREMITY ANGIOGRAM;  Surgeon: Larina Earthly, MD;  Location: [facility] CATH LAB;  Service: Cardiovascular;  Laterality: Bilateral;   PERCUTANEOUS STENT INTERVENTION Right 01/10/2013   Procedure: PERCUTANEOUS STENT INTERVENTION;  Surgeon: Kristen Loader  Early, MD;  Location: [facility] CATH LAB;  Service: Cardiovascular;  Laterality: Right;  rt common iliac stent   PERIPHERAL VASCULAR CATHETERIZATION N/A 11/26/2015   Procedure: Abdominal Aortogram w/Lower Extremity;  Surgeon: Chuck Hint, MD;  Location: [facility] INVASIVE CV LAB;  Service: Cardiovascular;  Laterality: N/A;    PERIPHERAL VASCULAR CATHETERIZATION Right 11/26/2015   Procedure: Peripheral Vascular Balloon Angioplasty;  Surgeon: Chuck Hint, MD;  Location: [facility] INVASIVE CV LAB;  Service: Cardiovascular;  Laterality: Right;  rt common iliac   PERIPHERAL VASCULAR INTERVENTION  07/16/2018   Procedure: PERIPHERAL VASCULAR INTERVENTION;  Surgeon: Chuck Hint, MD;  Location: [facility] INVASIVE CV LAB;  Service: Cardiovascular;;   PERIPHERAL VASCULAR INTERVENTION Right 10/05/2020   Procedure: PERIPHERAL VASCULAR INTERVENTION;  Surgeon: Chuck Hint, MD;  Location: [facility] INVASIVE CV LAB;  Service: Cardiovascular;  Laterality: Right;  Common and exteral iliac artery   TEE WITHOUT CARDIOVERSION N/A 03/26/2021   Procedure: TRANSESOPHAGEAL ECHOCARDIOGRAM (TEE);  Surgeon: Little Ishikawa, MD;  Location: [facility] ENDOSCOPY;  Service: Cardiovascular;  Laterality: N/A;   TOTAL ABDOMINAL HYSTERECTOMY       reports that she quit smoking about 19 months ago. Her smoking use included cigarettes. She started smoking about 45 years ago. She has a 11 pack-year smoking history. She has never used smokeless tobacco. She reports current alcohol use of about 1.0 standard drink of alcohol per week. She reports that she does not use drugs.  Allergies  Allergen Reactions   Crestor [Rosuvastatin] Other (See Comments)    Severe muscle pain   Asa [Aspirin] Rash    Can take low-dose aspirin.   Bempedoic Acid Other (See Comments)    Bilateral shoulder pain.   Codeine Rash   Penicillins Rash    Has patient had a PCN reaction causing immediate rash, facial/tongue/throat swelling, SOB or lightheadedness with hypotension:Yes Has patient had a PCN reaction causing severe rash involving mucus membranes or skin necrosis:Yes Has patient had a PCN reaction that required hospitalization:No Has patient had a PCN reaction occurring within the last 10 years:No If all of the above answers are "NO", then may proceed with  Cephalosporin use.     Family History  Problem Relation Age of Onset   Diabetes Mother    Hyperlipidemia Mother    Hypertension Mother    Varicose Veins Mother    COPD Mother    Stroke Mother    Anxiety disorder Mother    Depression Mother    Ovarian cancer Mother    Acute myelogenous leukemia Father    Diabetes Brother    Hypertension Brother    Hyperlipidemia Brother    Stroke Maternal Grandmother    Anxiety disorder Maternal Grandmother    Depression Maternal Grandmother    Breast cancer Maternal Grandmother    Diabetes Maternal Grandmother    Heart disease Maternal Grandfather    Lung cancer Paternal Grandfather    Other Brother        Blood disease   Anxiety disorder Daughter    Migraines Daughter    Migraines Daughter    Migraines Son    Seizures Son     Prior to Admission medications   Medication Sig Start Date End Date Taking? Authorizing Provider  Alirocumab (PRALUENT) 150 MG/ML SOAJ Inject 1 mL (150 mg total) into the skin every 14 (fourteen) days. 09/16/22   MilianAleen Campi, FNP  clopidogrel (PLAVIX) 75 MG tablet Take 1 tablet (75 mg total) by mouth daily. 04/24/22   Waverly Ferrari  S, MD  diclofenac Sodium (VOLTAREN) 1 % GEL Apply 4 g topically 4 (four) times daily. 09/18/22   Milian, Aleen Campi, FNP  empagliflozin (JARDIANCE) 10 MG TABS tablet Take 1 tablet (10 mg total) by mouth daily. 08/06/22   Milian, Aleen Campi, FNP  ezetimibe (ZETIA) 10 MG tablet Take 1 tablet (10 mg total) by mouth daily. 08/01/21   Gwenlyn Fudge, FNP  hydrochlorothiazide (HYDRODIURIL) 25 MG tablet Take 1 tablet (25 mg total) by mouth daily. 04/24/22   Sonny Masters, FNP  hydrOXYzine (VISTARIL) 25 MG capsule Take 1 capsule (25 mg total) by mouth every 6 (six) hours as needed. 09/18/22   Milian, Aleen Campi, FNP  levocetirizine (XYZAL) 5 MG tablet Take 1 tablet (5 mg total) by mouth every evening. 08/01/21   Gwenlyn Fudge, FNP  losartan (COZAAR) 50 MG tablet  TAKE 2 TABLETS BY MOUTH EVERY DAY 09/18/22   Sonny Masters, FNP  mirtazapine (REMERON) 7.5 MG tablet TAKE 1 TABLET BY MOUTH AT BEDTIME. 02/10/22   Rakes, Doralee Albino, FNP  montelukast (SINGULAIR) 10 MG tablet Take 1 tablet (10 mg total) by mouth at bedtime. 08/01/21   Gwenlyn Fudge, FNP  rOPINIRole (REQUIP) 0.25 MG tablet TAKE 1-2 TABLETS (0.25-0.5 MG TOTAL) BY MOUTH AT BEDTIME. 03/05/22   Rakes, Doralee Albino, FNP  Vitamin D, Ergocalciferol, (DRISDOL) 1.25 MG (50000 UNIT) CAPS capsule Take 1 capsule (50,000 Units total) by mouth every 7 (seven) days for 12 doses. 09/19/22 12/06/22  Arrie Senate, FNP    Physical Exam: Vitals:   10/31/22 1300 10/31/22 1315 10/31/22 1513 10/31/22 1534  BP: (!) 152/70 (!) 151/86  (!) 155/73  Pulse: (!) 53 (!) 55 60 62  Resp: 17 14 17 12   Temp:    97.9 F (36.6 C)  TempSrc:    Oral  SpO2: 99% 98% 98% 98%  Weight:      Height:        Constitutional: NAD, calm, comfortable Vitals:   10/31/22 1300 10/31/22 1315 10/31/22 1513 10/31/22 1534  BP: (!) 152/70 (!) 151/86  (!) 155/73  Pulse: (!) 53 (!) 55 60 62  Resp: 17 14 17 12   Temp:    97.9 F (36.6 C)  TempSrc:    Oral  SpO2: 99% 98% 98% 98%  Weight:      Height:       Eyes: PERRL, lids and conjunctivae normal ENMT: Mucous membranes are moist. Posterior pharynx clear of any exudate or lesions.poor dentition.  Neck: normal, supple, no masses, no thyromegaly Respiratory: clear to auscultation bilaterally, no wheezing, no crackles. Normal respiratory effort. No accessory muscle use.  Cardiovascular: Regular rate and rhythm, no murmurs / rubs / gallops. No extremity edema. 2+ pedal pulses. No carotid bruits.  Abdomen: no tenderness, no masses palpated. No hepatosplenomegaly. Bowel sounds positive.  Musculoskeletal: no clubbing / cyanosis. No joint deformity upper and lower extremities. Good ROM, no contractures. Normal muscle tone.  Skin: no rashes, lesions, ulcers. No induration Neurologic: CN 2-12  grossly intact. Sensation intact,. Strength 5/5 in all 4 except left 4/5( chronic ) Psychiatric: Normal judgment and insight. Alert and oriented x 3. Normal mood.    Labs on Admission: I have personally reviewed following labs and imaging studies  CBC: Recent Labs  Lab 10/31/22 1118 10/31/22 1134  WBC 8.3  --   NEUTROABS 4.4  --   HGB 14.8 15.3*  HCT 43.7 45.0  MCV 89.2  --   PLT 268  --  Basic Metabolic Panel: Recent Labs  Lab 10/31/22 1118 10/31/22 1134  NA 135 136  K 3.4* 3.5  CL 100 100  CO2 25  --   GLUCOSE 155* 150*  BUN 16 19  CREATININE 1.08* 1.00  CALCIUM 9.0  --    GFR: Estimated Creatinine Clearance: 52.1 mL/min (by C-G formula based on SCr of 1 mg/dL). Liver Function Tests: Recent Labs  Lab 10/31/22 1118  AST 26  ALT 18  ALKPHOS 101  BILITOT 0.6  PROT 6.7  ALBUMIN 3.8   No results for input(s): "LIPASE", "AMYLASE" in the last 168 hours. No results for input(s): "AMMONIA" in the last 168 hours. Coagulation Profile: Recent Labs  Lab 10/31/22 1118  INR 0.9   Cardiac Enzymes: No results for input(s): "CKTOTAL", "CKMB", "CKMBINDEX", "TROPONINI" in the last 168 hours. BNP (last 3 results) No results for input(s): "PROBNP" in the last 8760 hours. HbA1C: Recent Labs    10/31/22 1118  HGBA1C 6.4*   CBG: Recent Labs  Lab 10/31/22 1144  GLUCAP 135*   Lipid Profile: No results for input(s): "CHOL", "HDL", "LDLCALC", "TRIG", "CHOLHDL", "LDLDIRECT" in the last 72 hours. Thyroid Function Tests: No results for input(s): "TSH", "T4TOTAL", "FREET4", "T3FREE", "THYROIDAB" in the last 72 hours. Anemia Panel: No results for input(s): "VITAMINB12", "FOLATE", "FERRITIN", "TIBC", "IRON", "RETICCTPCT" in the last 72 hours. Urine analysis:    Component Value Date/Time   COLORURINE YELLOW 04/12/2021 1146   APPEARANCEUR Clear 10/20/2022 0957   LABSPEC 1.019 04/12/2021 1146   PHURINE 6.0 04/12/2021 1146   GLUCOSEU 3+ (A) 10/20/2022 0957   HGBUR  NEGATIVE 04/12/2021 1146   BILIRUBINUR Negative 10/20/2022 0957   KETONESUR NEGATIVE 04/12/2021 1146   PROTEINUR Negative 10/20/2022 0957   PROTEINUR 30 (A) 04/12/2021 1146   NITRITE Negative 10/20/2022 0957   NITRITE NEGATIVE 04/12/2021 1146   LEUKOCYTESUR Negative 10/20/2022 0957   LEUKOCYTESUR NEGATIVE 04/12/2021 1146    Radiological Exams on Admission: CT ANGIO HEAD NECK W WO CM  Result Date: 10/31/2022 CLINICAL DATA:  Right MCA infarcts seen on MRI EXAM: CT ANGIOGRAPHY HEAD AND NECK WITH AND WITHOUT CONTRAST TECHNIQUE: Multidetector CT imaging of the head and neck was performed using the standard protocol during bolus administration of intravenous contrast. Multiplanar CT image reconstructions and MIPs were obtained to evaluate the vascular anatomy. Carotid stenosis measurements (when applicable) are obtained utilizing NASCET criteria, using the distal internal carotid diameter as the denominator. RADIATION DOSE REDUCTION: This exam was performed according to the departmental dose-optimization program which includes automated exposure control, adjustment of the mA and/or kV according to patient size and/or use of iterative reconstruction technique. CONTRAST:  80mL OMNIPAQUE IOHEXOL 350 MG/ML SOLN COMPARISON:  Same day brain MRI, CTA head and neck 04/12/2021 FINDINGS: CT HEAD FINDINGS Brain: Again seen is an acute on subacute infarct in the posterior right MCA distribution as seen on same-day MRI. Curvilinear hyperdensity overlying the infarct territory is favored to reflect cortical laminar necrosis as opposed to acute blood given lack of susceptibility artifact on the MRI. There is no evidence of malignant hemorrhagic transformation. There is background parenchymal volume loss with prominence of the ventricular system and extra-axial CSF spaces. There are remote cortical infarcts in the left medial temporal lobe and left occipital lobe and additional remote infarcts in the brainstem and right  cerebellar hemisphere. The pituitary and suprasellar region are normal. There is no solid mass lesion. There is no mass effect or midline shift. Vascular: See below. Skull: Normal. Negative for fracture  or focal lesion. Sinuses/Orbits: The paranasal sinuses are clear. The globes and orbits are unremarkable. Other: The mastoid air cells and middle ear cavities are clear. Review of the MIP images confirms the above findings CTA NECK FINDINGS Aortic arch: There is mild calcified plaque in the imaged aortic arch. The origins of the major branch vessels are patent. The subclavian arteries are patent to the level imaged. Right carotid system: The right common, internal, and external carotid arteries are patent, with mild plaque at the bifurcation but no hemodynamically significant stenosis or occlusion. There is no evidence of dissection or aneurysm. Left carotid system: The left common, internal, and external carotid arteries are patent, with no hemodynamically significant stenosis or occlusion. There is no evidence of dissection or aneurysm. Vertebral arteries: The vertebral arteries are patent, without hemodynamically significant stenosis or occlusion there is no evidence of dissection or aneurysm. Skeleton: There is no acute osseous abnormality or suspicious osseous lesion. There is no visible canal hematoma. Other neck: There is extensive carious dental disease. The soft tissues of the neck are unremarkable. Upper chest: The imaged lung apices are clear. Review of the MIP images confirms the above findings CTA HEAD FINDINGS Anterior circulation: There is mild calcified plaque in the intracranial ICAs without significant stenosis or occlusion. The bilateral MCAs and ACAS are patent, without proximal stenosis or occlusion. The anterior communicating artery is normal. There is no aneurysm or AVM. Posterior circulation: The right V4 segment is diminutive after the PICA origin which appears progressed since the prior CTA.  The dominant left V4 segment is widely patent. The basilar artery is patent. The major cerebellar arteries appear patent. The left P1 segment is markedly stenotic with diminutive enhancement throughout the remainder of the PCA which correlates with the infarct in the distribution. There is multifocal moderate to severe stenosis of the right P1 and P2 segments the distal branches are identified. These findings are progressed since the prior CTA. There is no aneurysm or AVM. Venous sinuses: Patent. Anatomic variants: None. Review of the MIP images confirms the above findings IMPRESSION: 1. Acute on subacute right MCA distribution infarct as seen on same day MRI with associated cortical laminar necrosis. 2. No corresponding high-grade MCA stenosis or occlusion. 3. Diminutive caliber of the right V4 segment after the PICA origin, marked left P1 stenosis and diminutive enhancement distally, and multifocal moderate to severe stenosis of the right P1 and P2 segments are all progressed since the prior CTA from 04/12/2021. 4. Mild plaque at the carotid bifurcations without significant stenosis or occlusion. Electronically Signed   By: Lesia Hausen M.D.   On: 10/31/2022 15:49   MR Brain Wo Contrast  Result Date: 10/31/2022 CLINICAL DATA:  Provided history: Neuro deficit, acute, stroke suspected. Mental status change, unknown cause. EXAM: MRI HEAD WITHOUT CONTRAST TECHNIQUE: Multiplanar, multiecho pulse sequences of the brain and surrounding structures were obtained without intravenous contrast. COMPARISON:  Brain MRI 04/11/2021. CT angiogram head/neck 04/12/2021. FINDINGS: Brain: Moderate lateral and third ventriculomegaly, unchanged from the prior brain MRI of 04/11/2021. New from the prior MRI, there is a large region of cortical/subcortical T2 FLAIR hyperintense signal abnormality within the posterior right temporal lobe, right parietal lobe and right occipital lobe. There is a superimposed focus of restricted  diffusion within this territory spanning 5.7 cm. The imaging appearance is most consistent with an acute on subacute posterior right MCA territory infarct. 4 mm acute white matter infarct at the left temporoparietal junction (series 3, image 80). Moderate-sized chronic cortical/subcortical  left PCA territory infarct within the left temporal and occipital lobes, new from the prior MRI. Cortical laminar necrosis at this site. Redemonstrated chronic infarct within the right corona radiata/posterior limb of right internal capsule. Known chronic infarct within the left periatrial white matter/posterior limb of left internal capsule. Background age advanced multifocal T2 FLAIR hyperintense signal abnormality within the cerebral white matter, nonspecific but compatible with chronic small vessel ischemic disease. Chronic lacunar infarct within the right thalamus, new from the prior MRI. Progressive T2 FLAIR hyperintense signal abnormality within the bilateral mid brain and pons, and right ventral aspect of the medulla, reflecting a combination of Wallerian degeneration and chronic small vessel ischemic disease. This includes new chronic infarcts within the central and right aspect of the pons. Chronic lunar infarct within the right middle cerebellar peduncle, new from the prior MRI. Two small chronic infarcts within the right cerebellar hemisphere, unchanged No evidence of an intracranial mass. No chronic intracranial blood products. No extra-axial fluid collection. No midline shift. Vascular: A flow void is poorly delineated within the distal intracranial right vertebral artery. Flow voids preserved elsewhere within the proximal large arterial vessels. Skull and upper cervical spine: No focal suspicious marrow lesion. Sinuses/Orbits: No mass or acute finding within the imaged orbits. No significant paranasal sinus disease. Impressions #1 and #2 called by telephone at the time of interpretation on 10/31/2022 at 10:53 am to  provider [facility] , who verbally acknowledged these results. Dr. Louanne Skye contacted the patient via telephone prior to the patient leaving the imaging center. Impression #5 will be called to the ordering clinician or representative by the Radiologist Assistant, and communication documented in the PACS or [employer]. IMPRESSION: 1. Large acute on subacute cortical/subcortical posterior right MCA territory infarct. 2. 4 mm acute white matter infarct at the left temporoparietal junction. 3. Background age advanced chronic small vessel ischemic disc disease and chronic infarcts, as detailed and progressed from the prior brain MRI of 04/11/2021. This includes new but chronic infarcts within the left temporo-occipital lobes, right thalamus, pons and right middle cerebellar peduncle. 4. Unchanged moderate lateral and third ventriculomegaly which is nonspecific, but could reflect normal pressure hydrocephalus in the appropriate clinical setting. 5. A flow void is poorly delineated within the distal intracranial right vertebral artery, suspicious for high-grade stenosis or vessel occlusion. Consider MR or CT angiography for further evaluation. Electronically Signed   By: Jackey Loge D.O.   On: 10/31/2022 11:00    EKG: Independently reviewed. See above   Assessment/Plan   Acute on subacute right MCA distribution infarct  -prior hx of cva with residual left sided weakness -admit to progressive care/place on stroke protocol - SLP/PT/OT/ echo - HgbA1c, fasting lipid panel -patient has loop monitor in place will request interrogation - No need for Permissive HTN as symptoms on going x 2 weeks -  Aspirin - dose 81 and Plavix - dose 75mg  - Risk factor modification-encourage tobacco cessation - Frequent neuro checks  q4 hr neuro checks  Hypertension -resume home regimen once med rec completed   Hyperlipidemia -resume statin  DM2 -iss/fs  - resume jardiance  PVD - on plavix asa    DVT  prophylaxis:  heparin Code Status: DNR/ full/ as discussed per patient wishes in event of cardiac arrest  Family Communication:   Reffett,William (Spouse) [phone] (Mobile)   Disposition Plan: patient  expected to be admitted greater than 2 midnights  Consults called: Neurology  DR Ruthy Dick Admission status: progressive   Lurline Del MD  [facility]   If 7PM-7AM, please contact night-coverage [email or web address] Password [email or web address]  10/31/2022, 4:10 PM

## 2022-11-01 ENCOUNTER — Inpatient Hospital Stay (HOSPITAL_COMMUNITY): Payer: 59

## 2022-11-01 DIAGNOSIS — I6340 Cerebral infarction due to embolism of unspecified cerebral artery: Secondary | ICD-10-CM

## 2022-11-01 DIAGNOSIS — I639 Cerebral infarction, unspecified: Secondary | ICD-10-CM | POA: Diagnosis not present

## 2022-11-01 DIAGNOSIS — I6389 Other cerebral infarction: Secondary | ICD-10-CM

## 2022-11-01 LAB — GLUCOSE, CAPILLARY
Glucose-Capillary: 116 mg/dL — ABNORMAL HIGH (ref 70–99)
Glucose-Capillary: 102 mg/dL — ABNORMAL HIGH (ref 70–99)
Glucose-Capillary: 163 mg/dL — ABNORMAL HIGH (ref 70–99)

## 2022-11-01 LAB — LIPID PANEL
Cholesterol: 122 mg/dL (ref 0–200)
HDL: 45 mg/dL (ref >40)
LDL Cholesterol: 55 mg/dL (ref 0–99)
Total CHOL/HDL Ratio: 2.7 {ratio}
Triglycerides: 109 mg/dL (ref <150)
VLDL: 22 mg/dL (ref 0–40)

## 2022-11-01 MED ORDER — EZETIMIBE 10 MG PO TABS: 10.0000 mg | ORAL_TABLET | Freq: Every day | ORAL | 3 refills | Status: DC

## 2022-11-01 MED ORDER — ASPIRIN 81 MG PO TBEC: 81.0000 mg | DELAYED_RELEASE_TABLET | Freq: Every day | ORAL | 12 refills | Status: DC

## 2022-11-01 MED ORDER — CLOPIDOGREL BISULFATE 75 MG PO TABS: 75.0000 mg | ORAL_TABLET | Freq: Every day | ORAL | 0 refills | Status: AC

## 2022-11-01 MED ORDER — EZETIMIBE 10 MG PO TABS
10.0000 mg | ORAL_TABLET | Freq: Every day | ORAL | Status: DC
  Administered 2022-11-01: 10 mg via ORAL
  Filled 2022-11-01: qty 1

## 2022-11-01 MED ORDER — ATORVASTATIN CALCIUM 10 MG PO TABS
20.0000 mg | ORAL_TABLET | Freq: Every day | ORAL | Status: DC
Start: 2022-11-01 — End: 2022-11-01

## 2022-11-01 NOTE — Progress Notes (Signed)
Patient back to the unit.

## 2022-11-01 NOTE — Evaluation (Signed)
Occupational Therapy Evaluation Patient Details Name: Hannah Mcdaniel MRN: [id number] DOB: 11-14-1965 Today's Date: 11/01/2022   History of Present Illness [name] is a 57 y.o. female admitted 9/27 - MRI noted finding of acute to subacute CVA. PMHx:  hypertension, hyperlipidemia, DM2, Vit D deficiency, history of migraines, depression, PVD, prior cryptogenic CVA on 03/2021 on Plavi   Clinical Impression   PTA, pt lived with her husband and was mod I for ADL, medication management and driving, however, pt endorses becoming lost while driving and errors with medication management at home. Pt presenting with decreased memory, problem solving, awareness, and vision. Per pt, RUQ field cut from prior CVA. Pt able to detect but not define stimulus in LUQ during visual testing this session and missing 2/5 items in LUQ during star cancellation. Poor performance with line bisection as well with inconsistent differences between attempts. Pt to benefit from further skilled acute OT presently and OP OT services at discharge.       If plan is discharge home, recommend the following: Direct supervision/assist for medications management;Direct supervision/assist for financial management;Assist for transportation    Functional Status Assessment  Patient has had a recent decline in their functional status and demonstrates the ability to make significant improvements in function in a reasonable and predictable amount of time.  Equipment Recommendations  None recommended by OT    Recommendations for Other Services       Precautions / Restrictions Precautions Precautions: Fall Restrictions Weight Bearing Restrictions: No      Mobility Bed Mobility Overal bed mobility: Modified Independent             General bed mobility comments: extra time no assist    Transfers Overall transfer level: Needs assistance Equipment used: None, Straight cane Transfers: Sit to/from Stand Sit to Stand:  Supervision, Modified independent (Device/Increase time)           General transfer comment: Supervision without AD, posterior instability. Mod I with SPC.      Balance Overall balance assessment: Mild deficits observed, not formally tested                                         ADL either performed or assessed with clinical judgement   ADL Overall ADL's : Needs assistance/impaired Eating/Feeding: Modified independent   Grooming: Supervision/safety;Standing   Upper Body Bathing: Sitting;Modified independent   Lower Body Bathing: Supervison/ safety   Upper Body Dressing : Modified independent   Lower Body Dressing: Supervision/safety   Toilet Transfer: Supervision/safety           Functional mobility during ADLs: Supervision/safety       Vision Baseline Vision/History: 1 Wears glasses Ability to See in Adequate Light: 0 Adequate Patient Visual Report: No change from baseline (RUQ field cut at baseline from prior CVA) Vision Assessment?: Yes Eye Alignment: Within Functional Limits Ocular Range of Motion: Within Functional Limits Tracking/Visual Pursuits: Able to track stimulus in all quads without difficulty (min compensatory head turn RUQ) Saccades: Within functional limits Convergence: Impaired (comment) Visual Fields: Right superior homonymous quadranopsia Additional Comments: Performing letter cancellation and pt observed with good scanning pattern but missing several letters un LUQ; however, able to see during visual fields testing.     Perception         Praxis         Pertinent Vitals/Pain Pain Assessment Pain Assessment: No/denies  pain     Extremity/Trunk Assessment Upper Extremity Assessment Upper Extremity Assessment: Right hand dominant;LUE deficits/detail LUE Deficits / Details: hx of hemiparesis in 2023. 4/5 strength, pt reports is baseline.   Lower Extremity Assessment Lower Extremity Assessment: Defer to PT  evaluation LLE Deficits / Details: Hx of hemiparesis from CVA in 2023. Pt reports strength at baseline. LLE knee extension 4+/5, hip abduction 4-/5, knee flexion 4-/5, hip flexion 4-/5, ankle DF 4+/5, PF 5/5 LLE Sensation:  (Reports SILT) LLE Coordination: decreased gross motor (heel shin test abnormal Lt)       Communication Communication Communication: No apparent difficulties   Cognition Arousal: Alert Behavior During Therapy: WFL for tasks assessed/performed Overall Cognitive Status: Impaired/Different from baseline Area of Impairment: Orientation, Memory, Safety/judgement, Awareness, Problem solving                 Orientation Level: Disoriented to, Time   Memory: Decreased short-term memory (poor recall of events that happened during session at end)   Safety/Judgement: Decreased awareness of deficits Awareness: Emergent Problem Solving: Difficulty sequencing General Comments: Could not recall year; difficulty with executive functioning in home setting per report. Husband report pt does not want to take her medication, but pt reports that she forgets.  Unsure accuracy of either report but pt does demo some decr memory. Pt also with poor problem solving during simple money management task. pt with fair awareness of deficits.     General Comments       Exercises     Shoulder Instructions      Home Living Family/patient expects to be discharged to:: Private residence Living Arrangements: Spouse/significant other Available Help at Discharge: Family;Available 24 hours/day   Home Access: Ramped entrance     Home Layout: One level     Bathroom Shower/Tub: Producer, television/film/video: Standard Bathroom Accessibility: Yes   Home Equipment: Agricultural consultant (2 wheels);Cane - single point;BSC/3in1;Shower seat          Prior Functioning/Environment Prior Level of Function : Independent/Modified Independent;History of Falls (last six months)              Mobility Comments: uses SPC, reports 1 fall past 6 months, lost balance turning around ADLs Comments: Pt recently has been driving and managing medications mod I but has gotten lost 3x and with poor medication compliance. Pt reports she intends not to drive anymore. Pt husband cooks and cleans.        OT Problem List: Decreased strength;Decreased activity tolerance;Impaired balance (sitting and/or standing);Impaired vision/perception;Decreased cognition      OT Treatment/Interventions: Self-care/ADL training;Therapeutic exercise;DME and/or AE instruction;Therapeutic activities;Cognitive remediation/compensation;Visual/perceptual remediation/compensation;Patient/family education;Balance training    OT Goals(Current goals can be found in the care plan section) Acute Rehab OT Goals Patient Stated Goal: go home OT Goal Formulation: With patient Time For Goal Achievement: 11/15/22 Potential to Achieve Goals: Good  OT Frequency: Min 1X/week    Co-evaluation              AM-PAC OT "6 Clicks" Daily Activity     Outcome Measure Help from another person eating meals?: None Help from another person taking care of personal grooming?: A Little Help from another person toileting, which includes using toliet, bedpan, or urinal?: A Little Help from another person bathing (including washing, rinsing, drying)?: A Little Help from another person to put on and taking off regular upper body clothing?: None Help from another person to put on and taking off regular lower body clothing?: A Little  6 Click Score: 20   End of Session Equipment Utilized During Treatment: Gait belt Nurse Communication: Mobility status  Activity Tolerance: Patient tolerated treatment well Patient left: in bed;with call bell/phone within reach;with bed alarm set  OT Visit Diagnosis: Unsteadiness on feet (R26.81);Muscle weakness (generalized) (M62.81);Low vision, both eyes (H54.2);Other symptoms and signs involving  cognitive function                Time: 1914-7829 OT Time Calculation (min): 29 min Charges:  OT General Charges $OT Visit: 1 Visit OT Evaluation $OT Eval Moderate Complexity: 1 Mod OT Treatments $Self Care/Home Management : 8-22 mins  Tyler Deis, OTR/L [facility] Acute Rehabilitation Office: [phone]   Myrla Halsted 11/01/2022, 12:46 PM

## 2022-11-01 NOTE — Progress Notes (Addendum)
STROKE TEAM PROGRESS NOTE   BRIEF HPI Ms. Hannah Mcdaniel is a 57 y.o. female with history of CVA in Feb 2023  type 2 diabetes mellitus, hypertension, hyperlipidemia, migraine headaches, GERD, tobacco use, peripheral vascular disease on Plavix who was sent to the ED after an MRI showed a new infarct.   SIGNIFICANT HOSPITAL EVENTS   INTERIM HISTORY/SUBJECTIVE Hasn't noticed any changes in her strength. Does report some aphasia recent changes in her memory. She is following with [facility] already. She reports that she is still smoking and she she does forget to take her medications occasionally. She is trying to improve her consistency of medication taking and has a plan to have her pills in pill packs. She is also working on smoking cessation. Declined nicotine patch.    OBJECTIVE  CBC    Component Value Date/Time   WBC 7.9 10/31/2022 1739   RBC 4.77 10/31/2022 1739   HGB 14.4 10/31/2022 1739   HGB 16.4 (H) 10/20/2022 1048   HCT 41.8 10/31/2022 1739   HCT 46.4 10/20/2022 1048   PLT 267 10/31/2022 1739   PLT 286 10/20/2022 1048   MCV 87.6 10/31/2022 1739   MCV 90 10/20/2022 1048   MCH 30.2 10/31/2022 1739   MCHC 34.4 10/31/2022 1739   RDW 11.4 (L) 10/31/2022 1739   RDW 11.4 (L) 10/20/2022 1048   LYMPHSABS 3.4 10/31/2022 1118   LYMPHSABS 2.8 10/20/2022 1048   MONOABS 0.4 10/31/2022 1118   EOSABS 0.1 10/31/2022 1118   EOSABS 0.1 10/20/2022 1048   BASOSABS 0.1 10/31/2022 1118   BASOSABS 0.1 10/20/2022 1048    BMET    Component Value Date/Time   NA 136 10/31/2022 1134   NA 141 10/20/2022 1048   K 3.5 10/31/2022 1134   CL 100 10/31/2022 1134   CO2 25 10/31/2022 1118   GLUCOSE 150 (H) 10/31/2022 1134   BUN 19 10/31/2022 1134   BUN 11 10/20/2022 1048   CREATININE 1.19 (H) 10/31/2022 1739   CREATININE 0.63 02/04/2013 1200   CALCIUM 9.0 10/31/2022 1118   EGFR 59 (L) 10/20/2022 1048   GFRNONAA 53 (L) 10/31/2022 1739    IMAGING past 24 hours CT ANGIO HEAD NECK W  WO CM  Result Date: 10/31/2022 CLINICAL DATA:  Right MCA infarcts seen on MRI EXAM: CT ANGIOGRAPHY HEAD AND NECK WITH AND WITHOUT CONTRAST TECHNIQUE: Multidetector CT imaging of the head and neck was performed using the standard protocol during bolus administration of intravenous contrast. Multiplanar CT image reconstructions and MIPs were obtained to evaluate the vascular anatomy. Carotid stenosis measurements (when applicable) are obtained utilizing NASCET criteria, using the distal internal carotid diameter as the denominator. RADIATION DOSE REDUCTION: This exam was performed according to the departmental dose-optimization program which includes automated exposure control, adjustment of the mA and/or kV according to patient size and/or use of iterative reconstruction technique. CONTRAST:  80mL OMNIPAQUE IOHEXOL 350 MG/ML SOLN COMPARISON:  Same day brain MRI, CTA head and neck 04/12/2021 FINDINGS: CT HEAD FINDINGS Brain: Again seen is an acute on subacute infarct in the posterior right MCA distribution as seen on same-day MRI. Curvilinear hyperdensity overlying the infarct territory is favored to reflect cortical laminar necrosis as opposed to acute blood given lack of susceptibility artifact on the MRI. There is no evidence of malignant hemorrhagic transformation. There is background parenchymal volume loss with prominence of the ventricular system and extra-axial CSF spaces. There are remote cortical infarcts in the left medial temporal lobe and left occipital lobe  and additional remote infarcts in the brainstem and right cerebellar hemisphere. The pituitary and suprasellar region are normal. There is no solid mass lesion. There is no mass effect or midline shift. Vascular: See below. Skull: Normal. Negative for fracture or focal lesion. Sinuses/Orbits: The paranasal sinuses are clear. The globes and orbits are unremarkable. Other: The mastoid air cells and middle ear cavities are clear. Review of the MIP  images confirms the above findings CTA NECK FINDINGS Aortic arch: There is mild calcified plaque in the imaged aortic arch. The origins of the major branch vessels are patent. The subclavian arteries are patent to the level imaged. Right carotid system: The right common, internal, and external carotid arteries are patent, with mild plaque at the bifurcation but no hemodynamically significant stenosis or occlusion. There is no evidence of dissection or aneurysm. Left carotid system: The left common, internal, and external carotid arteries are patent, with no hemodynamically significant stenosis or occlusion. There is no evidence of dissection or aneurysm. Vertebral arteries: The vertebral arteries are patent, without hemodynamically significant stenosis or occlusion there is no evidence of dissection or aneurysm. Skeleton: There is no acute osseous abnormality or suspicious osseous lesion. There is no visible canal hematoma. Other neck: There is extensive carious dental disease. The soft tissues of the neck are unremarkable. Upper chest: The imaged lung apices are clear. Review of the MIP images confirms the above findings CTA HEAD FINDINGS Anterior circulation: There is mild calcified plaque in the intracranial ICAs without significant stenosis or occlusion. The bilateral MCAs and ACAS are patent, without proximal stenosis or occlusion. The anterior communicating artery is normal. There is no aneurysm or AVM. Posterior circulation: The right V4 segment is diminutive after the PICA origin which appears progressed since the prior CTA. The dominant left V4 segment is widely patent. The basilar artery is patent. The major cerebellar arteries appear patent. The left P1 segment is markedly stenotic with diminutive enhancement throughout the remainder of the PCA which correlates with the infarct in the distribution. There is multifocal moderate to severe stenosis of the right P1 and P2 segments the distal branches are  identified. These findings are progressed since the prior CTA. There is no aneurysm or AVM. Venous sinuses: Patent. Anatomic variants: None. Review of the MIP images confirms the above findings IMPRESSION: 1. Acute on subacute right MCA distribution infarct as seen on same day MRI with associated cortical laminar necrosis. 2. No corresponding high-grade MCA stenosis or occlusion. 3. Diminutive caliber of the right V4 segment after the PICA origin, marked left P1 stenosis and diminutive enhancement distally, and multifocal moderate to severe stenosis of the right P1 and P2 segments are all progressed since the prior CTA from 04/12/2021. 4. Mild plaque at the carotid bifurcations without significant stenosis or occlusion. Electronically Signed   By: Lesia Hausen M.D.   On: 10/31/2022 15:49   MR Brain Wo Contrast  Result Date: 10/31/2022 CLINICAL DATA:  Provided history: Neuro deficit, acute, stroke suspected. Mental status change, unknown cause. EXAM: MRI HEAD WITHOUT CONTRAST TECHNIQUE: Multiplanar, multiecho pulse sequences of the brain and surrounding structures were obtained without intravenous contrast. COMPARISON:  Brain MRI 04/11/2021. CT angiogram head/neck 04/12/2021. FINDINGS: Brain: Moderate lateral and third ventriculomegaly, unchanged from the prior brain MRI of 04/11/2021. New from the prior MRI, there is a large region of cortical/subcortical T2 FLAIR hyperintense signal abnormality within the posterior right temporal lobe, right parietal lobe and right occipital lobe. There is a superimposed focus of restricted diffusion  within this territory spanning 5.7 cm. The imaging appearance is most consistent with an acute on subacute posterior right MCA territory infarct. 4 mm acute white matter infarct at the left temporoparietal junction (series 3, image 80). Moderate-sized chronic cortical/subcortical left PCA territory infarct within the left temporal and occipital lobes, new from the prior MRI.  Cortical laminar necrosis at this site. Redemonstrated chronic infarct within the right corona radiata/posterior limb of right internal capsule. Known chronic infarct within the left periatrial white matter/posterior limb of left internal capsule. Background age advanced multifocal T2 FLAIR hyperintense signal abnormality within the cerebral white matter, nonspecific but compatible with chronic small vessel ischemic disease. Chronic lacunar infarct within the right thalamus, new from the prior MRI. Progressive T2 FLAIR hyperintense signal abnormality within the bilateral mid brain and pons, and right ventral aspect of the medulla, reflecting a combination of Wallerian degeneration and chronic small vessel ischemic disease. This includes new chronic infarcts within the central and right aspect of the pons. Chronic lunar infarct within the right middle cerebellar peduncle, new from the prior MRI. Two small chronic infarcts within the right cerebellar hemisphere, unchanged No evidence of an intracranial mass. No chronic intracranial blood products. No extra-axial fluid collection. No midline shift. Vascular: A flow void is poorly delineated within the distal intracranial right vertebral artery. Flow voids preserved elsewhere within the proximal large arterial vessels. Skull and upper cervical spine: No focal suspicious marrow lesion. Sinuses/Orbits: No mass or acute finding within the imaged orbits. No significant paranasal sinus disease. Impressions #1 and #2 called by telephone at the time of interpretation on 10/31/2022 at 10:53 am to provider [facility] , who verbally acknowledged these results. Dr. Louanne Skye contacted the patient via telephone prior to the patient leaving the imaging center. Impression #5 will be called to the ordering clinician or representative by the Radiologist Assistant, and communication documented in the PACS or [employer]. IMPRESSION: 1. Large acute on subacute  cortical/subcortical posterior right MCA territory infarct. 2. 4 mm acute white matter infarct at the left temporoparietal junction. 3. Background age advanced chronic small vessel ischemic disc disease and chronic infarcts, as detailed and progressed from the prior brain MRI of 04/11/2021. This includes new but chronic infarcts within the left temporo-occipital lobes, right thalamus, pons and right middle cerebellar peduncle. 4. Unchanged moderate lateral and third ventriculomegaly which is nonspecific, but could reflect normal pressure hydrocephalus in the appropriate clinical setting. 5. A flow void is poorly delineated within the distal intracranial right vertebral artery, suspicious for high-grade stenosis or vessel occlusion. Consider MR or CT angiography for further evaluation. Electronically Signed   By: Jackey Loge D.O.   On: 10/31/2022 11:00    Vitals:   10/31/22 1948 10/31/22 2323 11/01/22 0359 11/01/22 0740  BP: (!) 140/61 (!) 148/80 (!) 171/80 (!) 176/70  Pulse: (!) 57 66  (!) 58  Resp: 18  17 18   Temp: 98.4 F (36.9 C) 97.9 F (36.6 C) 98.1 F (36.7 C) 98 F (36.7 C)  TempSrc: Oral Oral  Oral  SpO2: 98% 99% 96% 99%  Weight:      Height:         PHYSICAL EXAM General:  Alert, well-nourished, well-developed patient in no acute distress Psych:  Mood and affect appropriate for situation CV: Regular rate and rhythm on monitor Respiratory:  Regular, unlabored respirations on room air GI: Abdomen soft and nontender   NEURO:  Mental Status: Patient is awake, alert, oriented to person, place, month, year,  and situation. Patient is able to give a clear and coherent history. No signs of aphasia or neglect Cranial Nerves: II: Pupils are equal, round, and reactive to light. Decreased visual acuity in left eye, reports blurry vision  III,IV, VI: EOMI without ptosis or diploplia.  V: Facial sensation is symmetric to temperature VII: Facial movement is symmetric resting and  smiling VIII: Hearing is intact to voice X: Palate elevates symmetrically XI: Shoulder shrug is symmetric. XII: Tongue protrudes midline without atrophy or fasciculations.  Motor: Tone is normal. Bulk is normal. 5/5 strength in right upper and lower extremities Still has residual weakness in left upper and lower extremities.  LUE 4+/5 LLE 4/5 Sensory: Residual numbness in dorsal and palmar aspect of left hand.  Cerebellar: FNF and HKS are intact bilaterally  ASSESSMENT/PLAN  Acute Ischemic Infarct:  right MCA subacute moderate and left periventricular WM small infarcts, etiology:  likely large and small vessel disease due to continued smoking and medication noncompliance CT head- Acute on subacute right MCA distribution infarct as seen on same day MRI with associated cortical laminar necrosis. CTA head & neck Diminutive caliber of the right V4 segment after the PICA origin, marked left P1 stenosis and diminutive enhancement distally, and multifocal moderate to severe stenosis of the right P1 and P2 segments are all progressed  MRI Large acute on subacute cortical/subcortical posterior right MCA territory infarct. 4 mm acute white matter infarct at the left temporoparietal junction. 2D Echo Pending  Loop recorder interrogation no afib so far LDL 55 HgbA1c 6.4 UDS pending VTE prophylaxis - heparin subq No antithrombotics prior to admission, now on aspirin 81 mg daily and clopidogrel 75 mg daily for 3 weeks and then ASA alone Therapy recommendations:  Home Health PT and Home Health OT Disposition:  Pending   History of CVA 03/21/2021 CVA-  s/p TNK and MRI showed b/l BG small infarcts likely secondary to synchronized small vessel disease given multiple uncontrolled risk factors. EF 55%, LDL 162, and A1C 6.1. discharged on DAPT, statin, zetia and fenofibrate 03/25/2021- MRI brain shows extension of acute infarct involving posterior limb of the right internal capsule and right temporal stem.  Discharged on Brilinta for 30days and then ASA and Plavix.  04/11/2021 - small left temporal horn periventricular WM small infarct likely secondary small vessel disease with uncontrolled risk factors . LDL 25 and A1C 6.2 Outpt followed with Dr. Pearlean Brownie at [facility] and loop recorder implanted. So far no afib  Hypertension Home meds:  Hydrochlorothiazide, losartan  Stable on the high end Gradually normalized BP Long term BP goal normotensive  Hyperlipidemia Home meds:  not compliant with statin LDL 55, goal < 70 Hx of LDL intolerance Now on zetia Continue zetia at discharge  Diabetes type II Controlled Home meds:  Metformin HgbA1c 6.4, goal < 7.0 CBGs SSI Recommend close follow-up with PCP for better DM control  Tobacco abuse Current smoker Smoking cessation counseling provided Pt is willing to quit  Other Stroke Risk Factors ETOH use, alcohol level <10, advised to drink no more than 1 drink(s) a day Migraines Family hx stroke (Mother, Maternal grandmother) PAD and claudication of RLE Continue ASA, plavix, and statin  Hospital day # 1  Patient seen and examined by NP/APP with MD. MD to update note as needed.   Elmer Picker, DNP, FNP-BC [facility] Pager: [phone]  ATTENDING NOTE: I reviewed above note and agree with the assessment and plan. Pt was seen and examined.   Husband is at  the bedside. Pt lying in bed, stated that 2 weeks ago she had sudden onset confusion and memory deficit. Went to see her PCP and ordered MRI as outpt. Then found out subacute stroke on MRI. Pt continues to smoke and not compliant with medication. On exam, pt not orientated to year and left lower quadrant visual field simultagnosia, otherwise neuro intact. Pt loop recorder no afib. Current stroke still consistent with large and small vessel disease. Continue DAPT and zetia. Educated on smoking cessation and medication compliance. PT and OT recommend HH.   For detailed assessment and  plan, please refer to above/below as I have made changes wherever appropriate.   Neurology will sign off. Please call with questions. Pt will follow up with [facility] at [facility] on 12/22/22. Thanks for the consult.   Marvel Plan, MD PhD Stroke Neurology 11/01/2022 5:42 PM     To contact Stroke Continuity provider, please refer to [email or web address]. After hours, contact General Neurology

## 2022-11-01 NOTE — Discharge Summary (Signed)
Physician Discharge Summary   Patient: Hannah Mcdaniel MRN: [id number] DOB: 05-08-1965  Admit date:     10/31/2022  Discharge date: 11/01/22  Discharge Physician: Meredeth Ide   PCP: Arrie Senate, FNP   Recommendations at discharge:    Follow up neurology as outpatient for echo report   Discharge Diagnoses: Active Problems:   Acute CVA (cerebrovascular accident) [facility])  Resolved Problems:   * No resolved hospital problems. *  Hospital Course:  57 y.o. female with medical history significant of hypertension, hyperlipidemia, DM2, Vit D deficiency, history of migraines, depression, PVD, prior cryptogenic CVA on 03/2021 on Plavix  who  was seen by neurology on 9/24 outpatient for evaluation for memory impairment. MRI and was noted have finding of acute to subacute cva.  Assessment and Plan:  Acute on subacute right MCA distribution infarct -Prior history of CVA with residual left-sided weakness -She has loop recorder in place -She has been noncompliant with aspirin and Plavix, continue with aspirin and Plavix for 3 weeks ,then aspirin alone indefinitely -Continues to smoke cigarettes, counseled for tobacco abuse -Neurology signed off -Echo report pending, neurology to follow as outpatient -PT recommends outpatient PT -HHPT ordered, but could not find [facility] service over the weekend -Patient will talk to pcp for HHpt   Hypertension - slowly add cozaar and HCTZ   Diabetes mellitus type 2 -Continue Jardiance    Peripheral vascular disease -Continue aspirin, Plavix      Consultants: Neurology Procedures performed: Echo Disposition: Home Diet recommendation:  Discharge Diet Orders (From admission, onward)     Start     Ordered   11/01/22 0000  Diet - low sodium heart healthy        11/01/22 1814           Cardiac diet DISCHARGE MEDICATION: Allergies as of 11/01/2022       Reactions   Crestor [rosuvastatin] Other (See Comments)   Severe muscle pain    Asa [aspirin] Rash   Can take low-dose aspirin.   Bempedoic Acid Other (See Comments)   Bilateral shoulder pain.   Codeine Rash   Penicillins Rash   Has patient had a PCN reaction causing immediate rash, facial/tongue/throat swelling, SOB or lightheadedness with hypotension:Yes Has patient had a PCN reaction causing severe rash involving mucus membranes or skin necrosis:Yes Has patient had a PCN reaction that required hospitalization:No Has patient had a PCN reaction occurring within the last 10 years:No If all of the above answers are "NO", then may proceed with Cephalosporin use.        Medication List     TAKE these medications    aspirin EC 81 MG tablet Take 1 tablet (81 mg total) by mouth daily. Swallow whole. Start taking on: November 02, 2022   clopidogrel 75 MG tablet Commonly known as: PLAVIX Take 1 tablet (75 mg total) by mouth daily for 21 days. Take aspirin and plavix daily together for 21 days, then stop plavix and continue taking aspirin indefinitely Start taking on: November 02, 2022   empagliflozin 10 MG Tabs tablet Commonly known as: Jardiance Take 1 tablet (10 mg total) by mouth daily.   ezetimibe 10 MG tablet Commonly known as: ZETIA Take 1 tablet (10 mg total) by mouth daily. Start taking on: November 02, 2022   hydrochlorothiazide 25 MG tablet Commonly known as: HYDRODIURIL Take 1 tablet (25 mg total) by mouth daily.   losartan 50 MG tablet Commonly known as: COZAAR TAKE 2 TABLETS BY MOUTH  EVERY DAY   montelukast 10 MG tablet Commonly known as: SINGULAIR Take 1 tablet (10 mg total) by mouth at bedtime.   Praluent 150 MG/ML Soaj Generic drug: Alirocumab Inject 1 mL (150 mg total) into the skin every 14 (fourteen) days.   Vitamin D (Ergocalciferol) 1.25 MG (50000 UNIT) Caps capsule Commonly known as: DRISDOL Take 1 capsule (50,000 Units total) by mouth every 7 (seven) days for 12 doses.        Follow-up Information     Marcos Eke, PA-C. Go on 11/21/2022.   Specialty: Neurology Contact information: [address] [place] [address] [place] [address] [phone]                Discharge Exam: Ceasar Mons Weights   10/31/22 1116  Weight: 61.2 kg     Condition at discharge: good  The results of significant diagnostics from this hospitalization (including imaging, microbiology, ancillary and laboratory) are listed below for reference.   Imaging Studies: CT ANGIO HEAD NECK W WO CM  Result Date: 10/31/2022 CLINICAL DATA:  Right MCA infarcts seen on MRI EXAM: CT ANGIOGRAPHY HEAD AND NECK WITH AND WITHOUT CONTRAST TECHNIQUE: Multidetector CT imaging of the head and neck was performed using the standard protocol during bolus administration of intravenous contrast. Multiplanar CT image reconstructions and MIPs were obtained to evaluate the vascular anatomy. Carotid stenosis measurements (when applicable) are obtained utilizing NASCET criteria, using the distal internal carotid diameter as the denominator. RADIATION DOSE REDUCTION: This exam was performed according to the departmental dose-optimization program which includes automated exposure control, adjustment of the mA and/or kV according to patient size and/or use of iterative reconstruction technique. CONTRAST:  80mL OMNIPAQUE IOHEXOL 350 MG/ML SOLN COMPARISON:  Same day brain MRI, CTA head and neck 04/12/2021 FINDINGS: CT HEAD FINDINGS Brain: Again seen is an acute on subacute infarct in the posterior right MCA distribution as seen on same-day MRI. Curvilinear hyperdensity overlying the infarct territory is favored to reflect cortical laminar necrosis as opposed to acute blood given lack of susceptibility artifact on the MRI. There is no evidence of malignant hemorrhagic transformation. There is background parenchymal volume loss with prominence of the ventricular system and extra-axial CSF spaces. There are remote cortical infarcts in the left medial temporal lobe and left  occipital lobe and additional remote infarcts in the brainstem and right cerebellar hemisphere. The pituitary and suprasellar region are normal. There is no solid mass lesion. There is no mass effect or midline shift. Vascular: See below. Skull: Normal. Negative for fracture or focal lesion. Sinuses/Orbits: The paranasal sinuses are clear. The globes and orbits are unremarkable. Other: The mastoid air cells and middle ear cavities are clear. Review of the MIP images confirms the above findings CTA NECK FINDINGS Aortic arch: There is mild calcified plaque in the imaged aortic arch. The origins of the major branch vessels are patent. The subclavian arteries are patent to the level imaged. Right carotid system: The right common, internal, and external carotid arteries are patent, with mild plaque at the bifurcation but no hemodynamically significant stenosis or occlusion. There is no evidence of dissection or aneurysm. Left carotid system: The left common, internal, and external carotid arteries are patent, with no hemodynamically significant stenosis or occlusion. There is no evidence of dissection or aneurysm. Vertebral arteries: The vertebral arteries are patent, without hemodynamically significant stenosis or occlusion there is no evidence of dissection or aneurysm. Skeleton: There is no acute osseous abnormality or suspicious osseous lesion. There is  no visible canal hematoma. Other neck: There is extensive carious dental disease. The soft tissues of the neck are unremarkable. Upper chest: The imaged lung apices are clear. Review of the MIP images confirms the above findings CTA HEAD FINDINGS Anterior circulation: There is mild calcified plaque in the intracranial ICAs without significant stenosis or occlusion. The bilateral MCAs and ACAS are patent, without proximal stenosis or occlusion. The anterior communicating artery is normal. There is no aneurysm or AVM. Posterior circulation: The right V4 segment is  diminutive after the PICA origin which appears progressed since the prior CTA. The dominant left V4 segment is widely patent. The basilar artery is patent. The major cerebellar arteries appear patent. The left P1 segment is markedly stenotic with diminutive enhancement throughout the remainder of the PCA which correlates with the infarct in the distribution. There is multifocal moderate to severe stenosis of the right P1 and P2 segments the distal branches are identified. These findings are progressed since the prior CTA. There is no aneurysm or AVM. Venous sinuses: Patent. Anatomic variants: None. Review of the MIP images confirms the above findings IMPRESSION: 1. Acute on subacute right MCA distribution infarct as seen on same day MRI with associated cortical laminar necrosis. 2. No corresponding high-grade MCA stenosis or occlusion. 3. Diminutive caliber of the right V4 segment after the PICA origin, marked left P1 stenosis and diminutive enhancement distally, and multifocal moderate to severe stenosis of the right P1 and P2 segments are all progressed since the prior CTA from 04/12/2021. 4. Mild plaque at the carotid bifurcations without significant stenosis or occlusion. Electronically Signed   By: Lesia Hausen M.D.   On: 10/31/2022 15:49   MR Brain Wo Contrast  Result Date: 10/31/2022 CLINICAL DATA:  Provided history: Neuro deficit, acute, stroke suspected. Mental status change, unknown cause. EXAM: MRI HEAD WITHOUT CONTRAST TECHNIQUE: Multiplanar, multiecho pulse sequences of the brain and surrounding structures were obtained without intravenous contrast. COMPARISON:  Brain MRI 04/11/2021. CT angiogram head/neck 04/12/2021. FINDINGS: Brain: Moderate lateral and third ventriculomegaly, unchanged from the prior brain MRI of 04/11/2021. New from the prior MRI, there is a large region of cortical/subcortical T2 FLAIR hyperintense signal abnormality within the posterior right temporal lobe, right parietal lobe  and right occipital lobe. There is a superimposed focus of restricted diffusion within this territory spanning 5.7 cm. The imaging appearance is most consistent with an acute on subacute posterior right MCA territory infarct. 4 mm acute white matter infarct at the left temporoparietal junction (series 3, image 80). Moderate-sized chronic cortical/subcortical left PCA territory infarct within the left temporal and occipital lobes, new from the prior MRI. Cortical laminar necrosis at this site. Redemonstrated chronic infarct within the right corona radiata/posterior limb of right internal capsule. Known chronic infarct within the left periatrial white matter/posterior limb of left internal capsule. Background age advanced multifocal T2 FLAIR hyperintense signal abnormality within the cerebral white matter, nonspecific but compatible with chronic small vessel ischemic disease. Chronic lacunar infarct within the right thalamus, new from the prior MRI. Progressive T2 FLAIR hyperintense signal abnormality within the bilateral mid brain and pons, and right ventral aspect of the medulla, reflecting a combination of Wallerian degeneration and chronic small vessel ischemic disease. This includes new chronic infarcts within the central and right aspect of the pons. Chronic lunar infarct within the right middle cerebellar peduncle, new from the prior MRI. Two small chronic infarcts within the right cerebellar hemisphere, unchanged No evidence of an intracranial mass. No chronic intracranial blood  products. No extra-axial fluid collection. No midline shift. Vascular: A flow void is poorly delineated within the distal intracranial right vertebral artery. Flow voids preserved elsewhere within the proximal large arterial vessels. Skull and upper cervical spine: No focal suspicious marrow lesion. Sinuses/Orbits: No mass or acute finding within the imaged orbits. No significant paranasal sinus disease. Impressions #1 and #2 called by  telephone at the time of interpretation on 10/31/2022 at 10:53 am to provider [facility] , who verbally acknowledged these results. Dr. Louanne Skye contacted the patient via telephone prior to the patient leaving the imaging center. Impression #5 will be called to the ordering clinician or representative by the Radiologist Assistant, and communication documented in the PACS or [employer]. IMPRESSION: 1. Large acute on subacute cortical/subcortical posterior right MCA territory infarct. 2. 4 mm acute white matter infarct at the left temporoparietal junction. 3. Background age advanced chronic small vessel ischemic disc disease and chronic infarcts, as detailed and progressed from the prior brain MRI of 04/11/2021. This includes new but chronic infarcts within the left temporo-occipital lobes, right thalamus, pons and right middle cerebellar peduncle. 4. Unchanged moderate lateral and third ventriculomegaly which is nonspecific, but could reflect normal pressure hydrocephalus in the appropriate clinical setting. 5. A flow void is poorly delineated within the distal intracranial right vertebral artery, suspicious for high-grade stenosis or vessel occlusion. Consider MR or CT angiography for further evaluation. Electronically Signed   By: Jackey Loge D.O.   On: 10/31/2022 11:00    Microbiology: Results for orders placed or performed in visit on 10/20/22  Urine Culture     Status: None   Collection Time: 10/20/22 10:05 AM   UR  Result Value Ref Range Status   Urine Culture, Routine Final report  Final   Organism ID, Bacteria No growth  Final    Labs: CBC: Recent Labs  Lab 10/31/22 1118 10/31/22 1134 10/31/22 1739  WBC 8.3  --  7.9  NEUTROABS 4.4  --   --   HGB 14.8 15.3* 14.4  HCT 43.7 45.0 41.8  MCV 89.2  --  87.6  PLT 268  --  267   Basic Metabolic Panel: Recent Labs  Lab 10/31/22 1118 10/31/22 1134 10/31/22 1739  NA 135 136  --   K 3.4* 3.5  --   CL 100 100  --   CO2 25  --    --   GLUCOSE 155* 150*  --   BUN 16 19  --   CREATININE 1.08* 1.00 1.19*  CALCIUM 9.0  --   --    Liver Function Tests: Recent Labs  Lab 10/31/22 1118  AST 26  ALT 18  ALKPHOS 101  BILITOT 0.6  PROT 6.7  ALBUMIN 3.8   CBG: Recent Labs  Lab 10/31/22 1738 10/31/22 2125 11/01/22 0625 11/01/22 1141 11/01/22 1656  GLUCAP 180* 115* 116* 102* 163*    Discharge time spent: greater than 30 minutes.  Signed: Meredeth Ide, MD [facility] 11/01/2022

## 2022-11-01 NOTE — Evaluation (Signed)
Speech Language Pathology Evaluation Patient Details Name: Hannah Mcdaniel MRN: [id number] DOB: 01-20-1966 Today's Date: 11/01/2022 Time: 0347-4259 SLP Time Calculation (min) (ACUTE ONLY): 55 min  Problem List:  Patient Active Problem List   Diagnosis Date Noted   Acute CVA (cerebrovascular accident) (HCC) 10/31/2022   Memory impairment 10/28/2022   History of recent stroke 08/01/2021   Difficulty sleeping 08/01/2021   Decreased appetite 08/01/2021   Cryptogenic stroke (HCC) 05/10/2021   Hypertension associated with diabetes (HCC) 04/11/2021   Seasonal allergies 04/11/2020   Smoker 05/27/2019   Hyperlipidemia associated with type 2 diabetes mellitus (HCC)    Migraine without aura and with status migrainosus, not intractable 04/29/2016   Type 2 diabetes mellitus with other specified complication (HCC) 01/24/2016   Anxiety, generalized 07/24/2015   Major depressive disorder, recurrent episode, moderate (HCC) 05/30/2013   PAD (peripheral artery disease) (HCC) 02/09/2013   Past Medical History:  Past Medical History:  Diagnosis Date   Anxiety    Arterial occlusive disease Nov. 2014   Arthritis    Claudication of lower extremity (HCC) Nov. 2014   Right Lower Extremity rest pain   Colon polyps    Depression    Diabetes mellitus without complication (HCC)    GERD (gastroesophageal reflux disease)    Hyperlipidemia    Migraines    Vertigo    Past Surgical History:  Past Surgical History:  Procedure Laterality Date   ABDOMINAL AORTAGRAM N/A 01/10/2013   Procedure: ABDOMINAL AORTAGRAM;  Surgeon: Larina Earthly, MD;  Location: [facility] CATH LAB;  Service: Cardiovascular;  Laterality: N/A;   ABDOMINAL AORTOGRAM W/LOWER EXTREMITY Bilateral 07/16/2018   Procedure: ABDOMINAL AORTOGRAM W/LOWER EXTREMITY;  Surgeon: Chuck Hint, MD;  Location: [facility] INVASIVE CV LAB;  Service: Cardiovascular;  Laterality: Bilateral;   ABDOMINAL AORTOGRAM W/LOWER EXTREMITY N/A 10/05/2020   Procedure:  ABDOMINAL AORTOGRAM W/LOWER EXTREMITY;  Surgeon: Chuck Hint, MD;  Location: [facility] INVASIVE CV LAB;  Service: Cardiovascular;  Laterality: N/A;   BUBBLE STUDY  03/26/2021   Procedure: BUBBLE STUDY;  Surgeon: Little Ishikawa, MD;  Location[facility] ENDOSCOPY;  Service: Cardiovascular;;   CHOLECYSTECTOMY     Gall Bladder   iliac artery angioplasty and stent placement  01/10/13   LOWER EXTREMITY ANGIOGRAM Bilateral 01/10/2013   Procedure: LOWER EXTREMITY ANGIOGRAM;  Surgeon: Larina Earthly, MD;  Location: [facility] CATH LAB;  Service: Cardiovascular;  Laterality: Bilateral;   PERCUTANEOUS STENT INTERVENTION Right 01/10/2013   Procedure: PERCUTANEOUS STENT INTERVENTION;  Surgeon: Larina Earthly, MD;  Location: [facility] CATH LAB;  Service: Cardiovascular;  Laterality: Right;  rt common iliac stent   PERIPHERAL VASCULAR CATHETERIZATION N/A 11/26/2015   Procedure: Abdominal Aortogram w/Lower Extremity;  Surgeon: Chuck Hint, MD;  Location: [facility] INVASIVE CV LAB;  Service: Cardiovascular;  Laterality: N/A;   PERIPHERAL VASCULAR CATHETERIZATION Right 11/26/2015   Procedure: Peripheral Vascular Balloon Angioplasty;  Surgeon: Chuck Hint, MD;  Location: [facility] INVASIVE CV LAB;  Service: Cardiovascular;  Laterality: Right;  rt common iliac   PERIPHERAL VASCULAR INTERVENTION  07/16/2018   Procedure: PERIPHERAL VASCULAR INTERVENTION;  Surgeon: Chuck Hint, MD;  Location: [facility] INVASIVE CV LAB;  Service: Cardiovascular;;   PERIPHERAL VASCULAR INTERVENTION Right 10/05/2020   Procedure: PERIPHERAL VASCULAR INTERVENTION;  Surgeon: Chuck Hint, MD;  Location: [facility] INVASIVE CV LAB;  Service: Cardiovascular;  Laterality: Right;  Common and exteral iliac artery   TEE WITHOUT CARDIOVERSION N/A 03/26/2021   Procedure: TRANSESOPHAGEAL ECHOCARDIOGRAM (TEE);  Surgeon: Little Ishikawa, MD;  Location:  [facility] ENDOSCOPY;  Service: Cardiovascular;  Laterality: N/A;   TOTAL ABDOMINAL HYSTERECTOMY     HPI:   57 yo female adm to [facility] with left sided weakness, sent for MRI by neurologist. Pt reported left sided weakness, vision and memory deficits over the last 2 weeks.  She got lost driving 3 times per her spouse and thus she does not drive anymore.  MOCA was administered in neurologist office with pt scoring 15/30.  Pt found to have Large acute on subacute cortical/subcortical posterior right MCA  territory infarct. 4 mm acute white matter infarct at the left temporoparietal junction. new but chronic infarcts within the left temporo-occipital lobes, right thalamus,  pons and right middle cerebellar peduncle compared to 2023 imaging. ? Normal pressure hydrocephalus.  PT with PMH + for smoking, prior CVAs in 2023.   Assessment / Plan / Recommendation Clinical Impression  [employer] Mental Status Exam administered with pt scoring 19/30 - strengths noted in areas of orientation, recall of narrative information, visuospatial skills.  She did not report that she had CVAs but reported she had undergone testing.  She also did not know her home street address nor specific date *it is her spouse's birthday.  Pt did recall 1/5 words independently, 4/5 with multiple choice cue - indicative of retrieval deficit.  Given she and spouse report difficulty with getting lost, etc, recommend SLP at next venue of care.  Given pt did not recall her home address, recommended she and her spouse write their home address and spouse's, close child's phone number on a notebook or paper - in close proximity for pt to use in emergent situations. Also discussed option of medication containers that have timers to open specific days and times for her administration and fall programs for phones, watches, etc. Recommend pt have family support to assure she is managing home duties including bills, medications, appointments, etc for safety.  Pt and spouse agreeable to plan.    SLP Assessment  SLP Recommendation/Assessment: All further  Speech Lanaguage Pathology  needs can be addressed in the next venue of care SLP Visit Diagnosis: Cognitive communication deficit (R41.841)    Recommendations for follow up therapy are one component of a multi-disciplinary discharge planning process, led by the attending physician.  Recommendations may be updated based on patient status, additional functional criteria and insurance authorization.    Follow Up Recommendations  Home health SLP    Assistance Recommended at Discharge   TBD  Functional Status Assessment Patient has had a recent decline in their functional status and demonstrates the ability to make significant improvements in function in a reasonable and predictable amount of time.  Frequency and Duration     TBD      SLP Evaluation Cognition  Overall Cognitive Status: Impaired/Different from baseline Arousal/Alertness: Awake/alert Orientation Level: Oriented to person;Oriented to place;Disoriented to situation (oriented to year, month and day of week - not specific date) Year: 2024 Month: September Day of Week: Correct Memory: Impaired Memory Impairment: Retrieval deficit (recalled 1/5 words independently, 4/5 with multiple choice cue) Awareness: Appears intact Problem Solving: Impaired Problem Solving Impairment: Verbal complex (functional math substraction, pt able to perform using paper/pencil) Safety/Judgment: Impaired       Comprehension  Auditory Comprehension Overall Auditory Comprehension: Appears within functional limits for tasks assessed Yes/No Questions: Not tested Commands: Within Functional Limits Conversation: Complex Reading Comprehension Reading Status: Not tested    Expression Expression Primary Mode of Expression: Verbal Verbal Expression Overall Verbal Expression: Appears within  functional limits for tasks assessed Initiation: No impairment Repetition:  (DNT) Naming: Not tested Pragmatics: No impairment Other Verbal Expression Comments:  pt read correct date on the board Written Expression Dominant Hand: Right Written Expression:  (clock drawing WFL)   Oral / Motor  Oral Motor/Sensory Function Overall Oral Motor/Sensory Function: Within functional limits Motor Speech Overall Motor Speech: Appears within functional limits for tasks assessed Respiration: Within functional limits Resonance: Within functional limits Articulation: Within functional limitis Intelligibility: Intelligible Motor Planning: Not tested Motor Speech Errors: Not applicable            Chales Abrahams 11/01/2022, 11:54 AM Rolena Infante, MS [facility] SLP Acute Rehab Services Office [phone]

## 2022-11-01 NOTE — Progress Notes (Addendum)
Received referral to assist with outpt PT/OT. Met with pt and husband. Pt reports that she prefers [facility] therapy. She reports that she doesn't drive. Husband reports that he works during the day and he is not able to take his wife to therapy. He reports that he is going to ask his sister to stay with pt for a couple of days. Notified Dr. Sharl Ma of pt's preference for [facility] therapy. Dr. Sharl Ma ordered [facility] PT/OT. Contacted Denyse Amass with [facility], they are not in network with pt's insurance. Angie with [facility] does not cover pt's address in [facility]. Contacted Glenda with [facility] and left VM.   Glenda did not return my call.  Met with pt and husband. Informed them that I was not able to find a [facility] agency. Pt reports that she can do her own therapy. Encouraged pt to discuss [facility] therapy with her PCP. They  verbalized understanding. Dr. Sharl Ma notified that I was not able to arrange [facility].

## 2022-11-01 NOTE — Plan of Care (Signed)
Problem: Education: Goal: Knowledge of disease or condition will improve Outcome: Adequate for Discharge Goal: Knowledge of secondary prevention will improve (MUST DOCUMENT ALL) Outcome: Adequate for Discharge Goal: Knowledge of patient specific risk factors will improve (Mark N/A or DELETE if not current risk factor) Outcome: Adequate for Discharge   Problem: Ischemic Stroke/TIA Tissue Perfusion: Goal: Complications of ischemic stroke/TIA will be minimized Outcome: Adequate for Discharge   Problem: Coping: Goal: Will verbalize positive feelings about self Outcome: Adequate for Discharge Goal: Will identify appropriate support needs Outcome: Adequate for Discharge   Problem: Health Behavior/Discharge Planning: Goal: Ability to manage health-related needs will improve Outcome: Adequate for Discharge Goal: Goals will be collaboratively established with patient/family Outcome: Adequate for Discharge   Problem: Self-Care: Goal: Ability to participate in self-care as condition permits will improve Outcome: Adequate for Discharge Goal: Verbalization of feelings and concerns over difficulty with self-care will improve Outcome: Adequate for Discharge Goal: Ability to communicate needs accurately will improve Outcome: Adequate for Discharge   Problem: Nutrition: Goal: Risk of aspiration will decrease Outcome: Adequate for Discharge Goal: Dietary intake will improve Outcome: Adequate for Discharge   Problem: Education: Goal: Ability to describe self-care measures that may prevent or decrease complications (Diabetes Survival Skills Education) will improve Outcome: Adequate for Discharge Goal: Individualized Educational Video(s) Outcome: Adequate for Discharge   Problem: Coping: Goal: Ability to adjust to condition or change in health will improve Outcome: Adequate for Discharge   Problem: Fluid Volume: Goal: Ability to maintain a balanced intake and output will  improve Outcome: Adequate for Discharge   Problem: Health Behavior/Discharge Planning: Goal: Ability to identify and utilize available resources and services will improve Outcome: Adequate for Discharge Goal: Ability to manage health-related needs will improve Outcome: Adequate for Discharge   Problem: Metabolic: Goal: Ability to maintain appropriate glucose levels will improve Outcome: Adequate for Discharge   Problem: Nutritional: Goal: Maintenance of adequate nutrition will improve Outcome: Adequate for Discharge Goal: Progress toward achieving an optimal weight will improve Outcome: Adequate for Discharge   Problem: Skin Integrity: Goal: Risk for impaired skin integrity will decrease Outcome: Adequate for Discharge   Problem: Tissue Perfusion: Goal: Adequacy of tissue perfusion will improve Outcome: Adequate for Discharge   Problem: Education: Goal: Knowledge of General Education information will improve Description: Including pain rating scale, medication(s)/side effects and non-pharmacologic comfort measures Outcome: Adequate for Discharge   Problem: Health Behavior/Discharge Planning: Goal: Ability to manage health-related needs will improve Outcome: Adequate for Discharge   Problem: Clinical Measurements: Goal: Ability to maintain clinical measurements within normal limits will improve Outcome: Adequate for Discharge Goal: Will remain free from infection Outcome: Adequate for Discharge Goal: Diagnostic test results will improve Outcome: Adequate for Discharge Goal: Respiratory complications will improve Outcome: Adequate for Discharge Goal: Cardiovascular complication will be avoided Outcome: Adequate for Discharge   Problem: Activity: Goal: Risk for activity intolerance will decrease Outcome: Adequate for Discharge   Problem: Nutrition: Goal: Adequate nutrition will be maintained Outcome: Adequate for Discharge   Problem: Coping: Goal: Level of  anxiety will decrease Outcome: Adequate for Discharge   Problem: Elimination: Goal: Will not experience complications related to bowel motility Outcome: Adequate for Discharge Goal: Will not experience complications related to urinary retention Outcome: Adequate for Discharge   Problem: Pain Managment: Goal: General experience of comfort will improve Outcome: Adequate for Discharge   Problem: Safety: Goal: Ability to remain free from injury will improve Outcome: Adequate for Discharge   Problem: Skin Integrity: Goal: Risk for   impaired skin integrity will decrease Outcome: Adequate for Discharge   

## 2022-11-01 NOTE — Evaluation (Signed)
Physical Therapy Evaluation Patient Details Name: Hannah Mcdaniel MRN: [id number] DOB: Oct 03, 1965 Today's Date: 11/01/2022  History of Present Illness  Hannah Mcdaniel is a 57 y.o. female admitted 9/27 - MRI noted finding of acute to subacute CVA. 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 Pt near baseline with residual Lt sided weakness from prior CVA in 2023 - she reports no change in strength or sensation. She was disoriented to year only but otherwise intact. Pt with high fall risk based on DGI but compensates well with SPC, nearing a mod I level with gait today at 150 feet. She does state that her balance and gait feel a little worse than baseline. She would benefit from OPPT follow-up to address these new deficits and maximize functional potential. Pt currently with functional limitations due to the deficits listed below (see PT Problem List). Pt will benefit from acute skilled PT to increase their independence and safety with mobility to allow discharge.           If plan is discharge home, recommend the following: Assistance with cooking/housework;Direct supervision/assist for medications management;Direct supervision/assist for financial management;Assist for transportation;Supervision due to cognitive status   Can travel by private vehicle    yes    Equipment Recommendations None recommended by PT     Functional Status Assessment Patient has had a recent decline in their functional status and demonstrates the ability to make significant improvements in function in a reasonable and predictable amount of time.     Precautions / Restrictions Precautions Precautions: Fall Restrictions Weight Bearing Restrictions: No      Mobility  Bed Mobility Overal bed mobility: Modified Independent             General bed mobility  comments: extra time no assist    Transfers Overall transfer level: Modified independent Equipment used: None, Straight cane Transfers: Sit to/from Stand Sit to Stand: Supervision, Modified independent (Device/Increase time)           General transfer comment: Supervision without AD, posterior instability. Mod I with SPC.    Ambulation/Gait Ambulation/Gait assistance: Supervision Gait Distance (Feet): 150 Feet Assistive device: Straight cane Gait Pattern/deviations: Step-through pattern, Decreased stance time - left, Decreased stride length, Decreased weight shift to left, Trendelenburg Gait velocity: decr Gait velocity interpretation: <1.8 ft/sec, indicate of risk for recurrent falls   General Gait Details: Supervision for safety with SPC during dynamic gait tasks. Guarded and limited with head motion vertical and horizontal with inreased deviation from straight path. Moderate instability with stepping backwards. Pt is able to self correct with SPC all episodes of imbalance. Educated on safety and awareness.  Stairs            Wheelchair Mobility     Tilt Bed    Modified Rankin (Stroke Patients Only) Modified Rankin (Stroke Patients Only) Pre-Morbid Rankin Score: No significant disability Modified Rankin: Moderate disability     Balance Overall balance assessment: Mild deficits observed, not formally tested                               Standardized Balance Assessment Standardized Balance Assessment : Dynamic Gait Index   Dynamic Gait Index Level Surface: Mild Impairment Change in Gait Speed: Mild Impairment Gait with Horizontal Head Turns: Mild Impairment Gait with Vertical Head Turns: Mild  Impairment Gait and Pivot Turn: Mild Impairment Step Over Obstacle: Moderate Impairment Step Around Obstacles: Mild Impairment Steps: Moderate Impairment Total Score: 14       Pertinent Vitals/Pain Pain Assessment Pain Assessment: No/denies pain     Home Living Family/patient expects to be discharged to:: Private residence Living Arrangements: Spouse/significant other Available Help at Discharge: Family;Available 24 hours/day   Home Access: Ramped entrance       Home Layout: One level Home Equipment: Agricultural consultant (2 wheels);Cane - single point;BSC/3in1;Shower seat      Prior Function Prior Level of Function : Independent/Modified Independent;History of Falls (last six months)             Mobility Comments: uses SPC, reports 1 fall past 6 months, lost balance turning around       Extremity/Trunk Assessment   Upper Extremity Assessment Upper Extremity Assessment: Defer to OT evaluation    Lower Extremity Assessment Lower Extremity Assessment: LLE deficits/detail LLE Deficits / Details: Hx of hemiparesis from CVA in 2023. Pt reports strength at baseline. LLE knee extension 4+/5, hip abduction 4-/5, knee flexion 4-/5, hip flexion 4-/5, ankle DF 4+/5, PF 5/5 LLE Sensation:  (Reports SILT) LLE Coordination: decreased gross motor (heel shin test abnormal Lt)       Communication   Communication Communication: No apparent difficulties  Cognition Arousal: Alert Behavior During Therapy: WFL for tasks assessed/performed Overall Cognitive Status: Impaired/Different from baseline Area of Impairment: Orientation                 Orientation Level: Disoriented to, Time             General Comments: Could not recall year.        General Comments      Exercises     Assessment/Plan    PT Assessment Patient needs continued PT services  PT Problem List Decreased strength;Decreased activity tolerance;Decreased balance;Decreased mobility;Decreased coordination;Decreased cognition;Decreased knowledge of use of DME;Decreased safety awareness;Decreased knowledge of precautions       PT Treatment Interventions DME instruction;Gait training;Functional mobility training;Therapeutic activities;Therapeutic  exercise;Neuromuscular re-education;Balance training;Cognitive remediation;Patient/family education    PT Goals (Current goals can be found in the Care Plan section)  Acute Rehab PT Goals Patient Stated Goal: Go home today PT Goal Formulation: With patient/family Time For Goal Achievement: 11/14/22 Potential to Achieve Goals: Good    Frequency Min 1X/week     Co-evaluation               AM-PAC PT "6 Clicks" Mobility  Outcome Measure Help needed turning from your back to your side while in a flat bed without using bedrails?: None Help needed moving from lying on your back to sitting on the side of a flat bed without using bedrails?: None Help needed moving to and from a bed to a chair (including a wheelchair)?: A Little Help needed standing up from a chair using your arms (e.g., wheelchair or bedside chair)?: A Little Help needed to walk in hospital room?: A Little Help needed climbing 3-5 steps with a railing? : A Little 6 Click Score: 20    End of Session Equipment Utilized During Treatment: Gait belt Activity Tolerance: Patient tolerated treatment well Patient left: in bed;with call bell/phone within reach;with bed alarm set;Other (comment) (MD speaking with pt.)   PT Visit Diagnosis: Unsteadiness on feet (R26.81);Other abnormalities of gait and mobility (R26.89);Hemiplegia and hemiparesis;History of falling (Z91.81) Hemiplegia - Right/Left: Left Hemiplegia - dominant/non-dominant: Non-dominant Hemiplegia - caused by: Unspecified  Time: 1610-9604 PT Time Calculation (min) (ACUTE ONLY): 16 min   Charges:   PT Evaluation $PT Eval Low Complexity: 1 Low   PT General Charges $$ ACUTE PT VISIT: 1 Visit         Kathlyn [place], PT, DPT [facility] Physical Therapist Office: [phone] Website: [email or web address]   Berton Mount 11/01/2022, 9:42 AM

## 2022-11-01 NOTE — Progress Notes (Signed)
[facility]  PROGRESS NOTE  Hannah Mcdaniel ZOX:[id number] DOB: 1965/11/02 DOA: 10/31/2022 PCP: Arrie Senate, FNP   Brief HPI:    57 y.o. female with medical history significant of hypertension, hyperlipidemia, DM2, Vit D deficiency, history of migraines, depression, PVD, prior cryptogenic CVA on 03/2021 on Plavix  who  was seen by neurology on 9/24 outpatient for evaluation for memory impairment. MRI and was noted have finding of acute to subacute cva.    Assessment/Plan:   Acute on subacute right MCA distribution infarct -Prior history of CVA with residual left-sided weakness -She has loop recorder in place -She has been noncompliant with aspirin and Plavix, continue with aspirin and Plavix -Continues to smoke cigarettes, counseled for tobacco abuse -Neurology following -PT recommends outpatient PT  Hypertension -Hold HCTZ, Cozaar for permissive hypertension  Diabetes mellitus type 2 -Continue Jardiance -Sliding scale insulin with NovoLog  Peripheral vascular disease -Continue aspirin, Plavix  Medications      stroke: early stages of recovery book   Does not apply Once   aspirin EC  81 mg Oral Daily   clopidogrel  75 mg Oral Daily   empagliflozin  10 mg Oral Daily   heparin  5,000 Units Subcutaneous Q8H   hydrochlorothiazide  25 mg Oral Daily   influenza vac split trivalent PF  0.5 mL Intramuscular Tomorrow-1000   insulin aspart  0-9 Units Subcutaneous TID WC   losartan  100 mg Oral Daily   montelukast  10 mg Oral QHS   sodium chloride flush  3 mL Intravenous Once   [START ON 11/06/2022] Vitamin D (Ergocalciferol)  50,000 Units Oral Q7 days     Data Reviewed:   CBG:  Recent Labs  Lab 10/31/22 1144 10/31/22 1738 10/31/22 2125 11/01/22 0625  GLUCAP 135* 180* 115* 116*    SpO2: 99 %    Vitals:   10/31/22 1948 10/31/22 2323 11/01/22 0359 11/01/22 0740  BP: (!) 140/61 (!) 148/80 (!) 171/80 (!) 176/70  Pulse: (!) 57 66  (!) 58  Resp: 18   17 18   Temp: 98.4 F (36.9 C) 97.9 F (36.6 C) 98.1 F (36.7 C) 98 F (36.7 C)  TempSrc: Oral Oral  Oral  SpO2: 98% 99% 96% 99%  Weight:      Height:          Data Reviewed:  Basic Metabolic Panel: Recent Labs  Lab 10/31/22 1118 10/31/22 1134 10/31/22 1739  NA 135 136  --   K 3.4* 3.5  --   CL 100 100  --   CO2 25  --   --   GLUCOSE 155* 150*  --   BUN 16 19  --   CREATININE 1.08* 1.00 1.19*  CALCIUM 9.0  --   --     CBC: Recent Labs  Lab 10/31/22 1118 10/31/22 1134 10/31/22 1739  WBC 8.3  --  7.9  NEUTROABS 4.4  --   --   HGB 14.8 15.3* 14.4  HCT 43.7 45.0 41.8  MCV 89.2  --  87.6  PLT 268  --  267    LFT Recent Labs  Lab 10/31/22 1118  AST 26  ALT 18  ALKPHOS 101  BILITOT 0.6  PROT 6.7  ALBUMIN 3.8     Antibiotics: Anti-infectives (From admission, onward)    None        DVT prophylaxis: Heparin  Code Status: DNR  Family Communication: Discussed with husband at bedside   CONSULTS neurology   Subjective  Denies any complaints, PT saw her in AM.  Recommend outpatient PT   Objective    Physical Examination:   General-appears in no acute distress Heart-S1-S2, regular, no murmur auscultated Lungs-clear to auscultation bilaterally, no wheezing or crackles auscultated Abdomen-soft, nontender, no organomegaly Extremities-no edema in the lower extremities Neuro-alert, oriented x3, no focal deficit noted  Status is: Inpatient:             Meredeth Ide   [facility] If 7PM-7AM, please contact night-coverage at [email or web address], Office  [phone]   11/01/2022, 9:03 AM  LOS: 1 day

## 2022-11-01 NOTE — Progress Notes (Signed)
AVS instruction given, patient verbalized understanding, ready for discharge

## 2022-11-01 NOTE — Plan of Care (Signed)
  Problem: Education: Goal: Knowledge of disease or condition will improve Outcome: Progressing Goal: Knowledge of secondary prevention will improve (MUST DOCUMENT ALL) Outcome: Progressing Goal: Knowledge of patient specific risk factors will improve Loraine Leriche N/A or DELETE if not current risk factor) Outcome: Progressing   Problem: Ischemic Stroke/TIA Tissue Perfusion: Goal: Complications of ischemic stroke/TIA will be minimized Outcome: Progressing   Problem: Coping: Goal: Will verbalize positive feelings about self Outcome: Progressing Goal: Will identify appropriate support needs Outcome: Progressing   Problem: Health Behavior/Discharge Planning: Goal: Ability to manage health-related needs will improve Outcome: Progressing Goal: Goals will be collaboratively established with patient/family Outcome: Progressing   Problem: Education: Goal: Ability to describe self-care measures that may prevent or decrease complications (Diabetes Survival Skills Education) will improve Outcome: Progressing Goal: Individualized Educational Video(s) Outcome: Progressing   Problem: Education: Goal: Knowledge of General Education information will improve Description: Including pain rating scale, medication(s)/side effects and non-pharmacologic comfort measures Outcome: Progressing

## 2022-11-01 NOTE — Progress Notes (Signed)
Patient off the unit to vascular.

## 2022-11-02 LAB — ECHOCARDIOGRAM COMPLETE
AR max vel: 1.91 cm2
AV Area VTI: 1.83 cm2
AV Area mean vel: 1.79 cm2
AV Mean grad: 4 mm[Hg]
AV Peak grad: 8.9 mm[Hg]
Ao pk vel: 1.49 m/s
Area-P 1/2: 2.91 cm2
Height: 61 in
S' Lateral: 2.5 cm
Weight: 2160 [oz_av]

## 2022-11-03 ENCOUNTER — Ambulatory Visit (INDEPENDENT_AMBULATORY_CARE_PROVIDER_SITE_OTHER): Payer: 59

## 2022-11-03 DIAGNOSIS — I639 Cerebral infarction, unspecified: Secondary | ICD-10-CM | POA: Diagnosis not present

## 2022-11-04 ENCOUNTER — Ambulatory Visit (HOSPITAL_COMMUNITY): Payer: 59 | Admitting: Speech Pathology

## 2022-11-04 ENCOUNTER — Ambulatory Visit: Payer: 59

## 2022-11-04 ENCOUNTER — Other Ambulatory Visit (HOSPITAL_COMMUNITY): Payer: Self-pay

## 2022-11-04 LAB — CUP PACEART REMOTE DEVICE CHECK
Date Time Interrogation Session: 20240930231542
Implantable Pulse Generator Implant Date: 20230508

## 2022-11-14 ENCOUNTER — Telehealth: Payer: Self-pay | Admitting: Family Medicine

## 2022-11-17 ENCOUNTER — Other Ambulatory Visit: Payer: Self-pay

## 2022-11-17 ENCOUNTER — Ambulatory Visit (INDEPENDENT_AMBULATORY_CARE_PROVIDER_SITE_OTHER): Payer: 59 | Admitting: Nurse Practitioner

## 2022-11-17 ENCOUNTER — Encounter: Payer: Self-pay | Admitting: Nurse Practitioner

## 2022-11-17 VITALS — BP 123/68 | HR 91 | Temp 97.3°F | Ht 61.0 in | Wt 133.4 lb

## 2022-11-17 DIAGNOSIS — A6009 Herpesviral infection of other urogenital tract: Secondary | ICD-10-CM | POA: Insufficient documentation

## 2022-11-17 DIAGNOSIS — N6322 Unspecified lump in the left breast, upper inner quadrant: Secondary | ICD-10-CM

## 2022-11-17 DIAGNOSIS — N6452 Nipple discharge: Secondary | ICD-10-CM

## 2022-11-17 MED ORDER — ACYCLOVIR 400 MG PO TABS: 400.0000 mg | ORAL_TABLET | Freq: Three times a day (TID) | ORAL | 0 refills | Status: DC

## 2022-11-17 NOTE — Progress Notes (Signed)
Carelink Summary Report / Loop Recorder 

## 2022-11-17 NOTE — Telephone Encounter (Signed)
At this point I do not know if I can take her onto my panel because I am still booked out pretty far.

## 2022-11-17 NOTE — Progress Notes (Signed)
Established Patient Office Visit  Subjective   Patient ID: Hannah Mcdaniel, female    DOB: 1965-06-18  Age: 57 y.o. MRN: [id number]  Chief Complaint  Patient presents with   spot on breast    Boil on left breast that has been there for over a week and has busted. Also has boil between her legs that has busted    HPI   57 y.o. female with medical history significant of hypertension, hyperlipidemia, DM2, Vit D deficiency, history of migraines, depression, PVD, prior cryptogenic CVA on 03/2021 on Plavix  Left Breast Lump Initial encounter She reports noticing a painful knot in her left breast, specifically felt at the 11 o'clock position during her self-examination. And has been there for over 2-weeks. In addition to the breast lump, she has observed discharge from the left nipple. And area around the nipple is erythematousTo evaluate the breast lump and rule out malignancy, a stat mammogram has been ordered.  The patient also describes a recurring painful sore on her left labia, which has been a source of discomfort. Given the history of this sore, a herpes culture test was ordered, and she will be started on antiviral medication pending results. Base labs ordered   Patient Active Problem List   Diagnosis Date Noted   Breast lump on left side at 11 o'clock position 11/17/2022   Herpes genitalis in women 11/17/2022   Acute CVA (cerebrovascular accident) (HCC) 10/31/2022   Memory impairment 10/28/2022   History of recent stroke 08/01/2021   Difficulty sleeping 08/01/2021   Decreased appetite 08/01/2021   Cryptogenic stroke (HCC) 05/10/2021   Hypertension associated with diabetes (HCC) 04/11/2021   Seasonal allergies 04/11/2020   Smoker 05/27/2019   Hyperlipidemia associated with type 2 diabetes mellitus (HCC)    Migraine without aura and with status migrainosus, not intractable 04/29/2016   Type 2 diabetes mellitus with other specified complication (HCC) 01/24/2016   Anxiety,  generalized 07/24/2015   Major depressive disorder, recurrent episode, moderate (HCC) 05/30/2013   PAD (peripheral artery disease) (HCC) 02/09/2013   Past Medical History:  Diagnosis Date   Anxiety    Arterial occlusive disease Nov. 2014   Arthritis    Claudication of lower extremity (HCC) Nov. 2014   Right Lower Extremity rest pain   Colon polyps    Depression    Diabetes mellitus without complication (HCC)    GERD (gastroesophageal reflux disease)    Hyperlipidemia    Migraines    Vertigo    Past Surgical History:  Procedure Laterality Date   ABDOMINAL AORTAGRAM N/A 01/10/2013   Procedure: ABDOMINAL AORTAGRAM;  Surgeon: Larina Earthly, MD;  Location: [facility] CATH LAB;  Service: Cardiovascular;  Laterality: N/A;   ABDOMINAL AORTOGRAM W/LOWER EXTREMITY Bilateral 07/16/2018   Procedure: ABDOMINAL AORTOGRAM W/LOWER EXTREMITY;  Surgeon: Chuck Hint, MD;  Location: [facility] INVASIVE CV LAB;  Service: Cardiovascular;  Laterality: Bilateral;   ABDOMINAL AORTOGRAM W/LOWER EXTREMITY N/A 10/05/2020   Procedure: ABDOMINAL AORTOGRAM W/LOWER EXTREMITY;  Surgeon: Chuck Hint, MD;  Location: [facility] INVASIVE CV LAB;  Service: Cardiovascular;  Laterality: N/A;   BUBBLE STUDY  03/26/2021   Procedure: BUBBLE STUDY;  Surgeon: Little Ishikawa, MD;  Location: [facility] ENDOSCOPY;  Service: Cardiovascular;;   CHOLECYSTECTOMY     Gall Bladder   iliac artery angioplasty and stent placement  01/10/13   LOWER EXTREMITY ANGIOGRAM Bilateral 01/10/2013   Procedure: LOWER EXTREMITY ANGIOGRAM;  Surgeon: Larina Earthly, MD;  Location: [facility] CATH LAB;  Service: Cardiovascular;  Laterality: Bilateral;   PERCUTANEOUS STENT INTERVENTION Right 01/10/2013   Procedure: PERCUTANEOUS STENT INTERVENTION;  Surgeon: Larina Earthly, MD;  Location: [facility] CATH LAB;  Service: Cardiovascular;  Laterality: Right;  rt common iliac stent   PERIPHERAL VASCULAR CATHETERIZATION N/A 11/26/2015   Procedure: Abdominal Aortogram w/Lower Extremity;   Surgeon: Chuck Hint, MD;  Location: [facility] INVASIVE CV LAB;  Service: Cardiovascular;  Laterality: N/A;   PERIPHERAL VASCULAR CATHETERIZATION Right 11/26/2015   Procedure: Peripheral Vascular Balloon Angioplasty;  Surgeon: Chuck Hint, MD;  Location: [facility] INVASIVE CV LAB;  Service: Cardiovascular;  Laterality: Right;  rt common iliac   PERIPHERAL VASCULAR INTERVENTION  07/16/2018   Procedure: PERIPHERAL VASCULAR INTERVENTION;  Surgeon: Chuck Hint, MD;  Location: [facility] INVASIVE CV LAB;  Service: Cardiovascular;;   PERIPHERAL VASCULAR INTERVENTION Right 10/05/2020   Procedure: PERIPHERAL VASCULAR INTERVENTION;  Surgeon: Chuck Hint, MD;  Location[facility] INVASIVE CV LAB;  Service: Cardiovascular;  Laterality: Right;  Common and exteral iliac artery   TEE WITHOUT CARDIOVERSION N/A 03/26/2021   Procedure: TRANSESOPHAGEAL ECHOCARDIOGRAM (TEE);  Surgeon: Little Ishikawa, MD;  Location: [facility] ENDOSCOPY;  Service: Cardiovascular;  Laterality: N/A;   TOTAL ABDOMINAL HYSTERECTOMY     Social History   Tobacco Use   Smoking status: Former    Current packs/day: 0.00    Average packs/day: 0.3 packs/day for 43.8 years (11.0 ttl pk-yrs)    Types: Cigarettes    Start date: 05/16/1977    Quit date: 03/21/2021    Years since quitting: 1.6   Smokeless tobacco: Never   Tobacco comments:    1/2 cigarette per day  Vaping Use   Vaping status: Never Used  Substance Use Topics   Alcohol use: Yes    Alcohol/week: 1.0 standard drink of alcohol    Types: 1 Shots of liquor per week    Comment: occ   Drug use: No   Social History   Socioeconomic History   Marital status: Married    Spouse name: Not on file   Number of children: 3   Years of education: Not on file   Highest education level: 10th grade  Occupational History   Not on file  Tobacco Use   Smoking status: Former    Current packs/day: 0.00    Average packs/day: 0.3 packs/day for 43.8 years (11.0 ttl pk-yrs)     Types: Cigarettes    Start date: 05/16/1977    Quit date: 03/21/2021    Years since quitting: 1.6   Smokeless tobacco: Never   Tobacco comments:    1/2 cigarette per day  Vaping Use   Vaping status: Never Used  Substance and Sexual Activity   Alcohol use: Yes    Alcohol/week: 1.0 standard drink of alcohol    Types: 1 Shots of liquor per week    Comment: occ   Drug use: No   Sexual activity: Yes    Partners: Male    Birth control/protection: Surgical  Other Topics Concern   Not on file  Social History Narrative   Right handed   Lives with husband   Not working   Drinks caffeine prn   One floor home   Social Determinants of Health   Financial Resource Strain: Low Risk  (05/03/2022)   Overall Financial Resource Strain (CARDIA)    Difficulty of Paying Living Expenses: Not hard at all  Food Insecurity: No Food Insecurity (10/31/2022)   Hunger Vital Sign    Worried About Running Out of Food in  the Last Year: Never true    Ran Out of Food in the Last Year: Never true  Transportation Needs: No Transportation Needs (10/31/2022)   PRAPARE - Administrator, Civil Service (Medical): No    Lack of Transportation (Non-Medical): No  Physical Activity: Insufficiently Active (05/03/2022)   Exercise Vital Sign    Days of Exercise per Week: 1 day    Minutes of Exercise per Session: 20 min  Stress: No Stress Concern Present (05/03/2022)   [employer] of Occupational Health - Occupational Stress Questionnaire    Feeling of Stress : Not at all  Social Connections: Moderately Isolated (05/03/2022)   Social Connection and Isolation Panel [NHANES]    Frequency of Communication with Friends and Family: Three times a week    Frequency of Social Gatherings with Friends and Family: Once a week    Attends Religious Services: Never    Database administrator or Organizations: No    Attends Engineer, structural: Not on file    Marital Status: Married  Catering manager  Violence: Not At Risk (10/31/2022)   Humiliation, Afraid, Rape, and Kick questionnaire    Fear of Current or Ex-Partner: No    Emotionally Abused: No    Physically Abused: No    Sexually Abused: No   Family Status  Relation Name Status   Mother  Alive   Father  Deceased   Brother  Alive   MGM  Deceased   MGF  Deceased   PGM  Deceased   PGF  Deceased   Brother  Alive   Brother  Alive   Brother  Alive   Daughter  Alive   Daughter  Alive   Son  Alive  No partnership data on file   Family History  Problem Relation Age of Onset   Diabetes Mother    Hyperlipidemia Mother    Hypertension Mother    Varicose Veins Mother    COPD Mother    Stroke Mother    Anxiety disorder Mother    Depression Mother    Ovarian cancer Mother    Acute myelogenous leukemia Father    Diabetes Brother    Hypertension Brother    Hyperlipidemia Brother    Stroke Maternal Grandmother    Anxiety disorder Maternal Grandmother    Depression Maternal Grandmother    Breast cancer Maternal Grandmother    Diabetes Maternal Grandmother    Heart disease Maternal Grandfather    Lung cancer Paternal Grandfather    Other Brother        Blood disease   Anxiety disorder Daughter    Migraines Daughter    Migraines Daughter    Migraines Son    Seizures Son    Allergies  Allergen Reactions   Crestor [Rosuvastatin] Other (See Comments)    Severe muscle pain   Asa [Aspirin] Rash    Can take low-dose aspirin.   Bempedoic Acid Other (See Comments)    Bilateral shoulder pain.   Codeine Rash   Penicillins Rash    Has patient had a PCN reaction causing immediate rash, facial/tongue/throat swelling, SOB or lightheadedness with hypotension:Yes Has patient had a PCN reaction causing severe rash involving mucus membranes or skin necrosis:Yes Has patient had a PCN reaction that required hospitalization:No Has patient had a PCN reaction occurring within the last 10 years:No If all of the above answers are "NO",  then may proceed with Cephalosporin use.       Review of Systems  Constitutional:  Negative for chills and fever.  HENT:  Negative for congestion.   Respiratory:  Negative for hemoptysis, wheezing and stridor.   Cardiovascular:  Negative for chest pain and leg swelling.  Gastrointestinal:  Negative for blood in stool, nausea and vomiting.  Genitourinary:        Painful sores  Musculoskeletal:  Negative for falls and neck pain.  Skin:  Negative for itching and rash.  Neurological:  Negative for dizziness, tingling and headaches.  Endo/Heme/Allergies:  Negative for environmental allergies and polydipsia. Does not bruise/bleed easily.  Psychiatric/Behavioral:  Negative for substance abuse and suicidal ideas. The patient does not have insomnia.    Negative unless indicated in HPI   Objective:     BP 123/68   Pulse 91   Temp (!) 97.3 F (36.3 C) (Temporal)   Ht 5\' 1"  (1.549 m)   Wt 133 lb 6.4 oz (60.5 kg)   SpO2 99%   BMI 25.21 kg/m  BP Readings from Last 3 Encounters:  11/17/22 123/68  11/01/22 (!) 140/60  10/28/22 (!) 153/85   Wt Readings from Last 3 Encounters:  11/17/22 133 lb 6.4 oz (60.5 kg)  10/31/22 135 lb (61.2 kg)  10/28/22 134 lb (60.8 kg)      Physical Exam Vitals and nursing note reviewed. Exam conducted with a chaperone present (family member was in the room).  Constitutional:      General: She is not in acute distress.    Appearance: Normal appearance.  HENT:     Head: Normocephalic and atraumatic.  Eyes:     General: No scleral icterus.    Conjunctiva/sclera: Conjunctivae normal.     Pupils: Pupils are equal, round, and reactive to light.  Neck:     Vascular: No carotid bruit.  Cardiovascular:     Rate and Rhythm: Normal rate and regular rhythm.  Pulmonary:     Effort: Pulmonary effort is normal.     Breath sounds: Normal breath sounds.  Chest:  Breasts:    Tanner Score is 5.     Left: Mass, nipple discharge and tenderness present.        Comments: Lump at 11 o'clock nipple discharges and redness around the nipple area  Abdominal:     General: Bowel sounds are normal.     Palpations: There is no mass.     Tenderness: There is no abdominal tenderness. There is no guarding.     Hernia: No hernia is present.  Genitourinary:    Labia:        Left: Rash, tenderness and lesion present.   Musculoskeletal:        General: Normal range of motion.     Cervical back: Normal range of motion and neck supple. No rigidity or tenderness.     Right lower leg: No edema.     Left lower leg: No edema.  Lymphadenopathy:     Cervical: No cervical adenopathy.     Upper Body:     Right upper body: No axillary adenopathy.     Left upper body: No axillary adenopathy.  Skin:    General: Skin is warm and dry.     Findings: No lesion or rash.  Neurological:     Mental Status: She is alert and oriented to person, place, and time. Mental status is at baseline.  Psychiatric:        Mood and Affect: Mood normal.        Behavior: Behavior normal.  Thought Content: Thought content normal.        Judgment: Judgment normal.     No results found for any visits on 11/17/22.  Last CBC Lab Results  Component Value Date   WBC 7.9 10/31/2022   HGB 14.4 10/31/2022   HCT 41.8 10/31/2022   MCV 87.6 10/31/2022   MCH 30.2 10/31/2022   RDW 11.4 (L) 10/31/2022   PLT 267 10/31/2022   Last metabolic panel Lab Results  Component Value Date   GLUCOSE 150 (H) 10/31/2022   NA 136 10/31/2022   K 3.5 10/31/2022   CL 100 10/31/2022   CO2 25 10/31/2022   BUN 19 10/31/2022   CREATININE 1.19 (H) 10/31/2022   GFRNONAA 53 (L) 10/31/2022   CALCIUM 9.0 10/31/2022   PROT 6.7 10/31/2022   ALBUMIN 3.8 10/31/2022   LABGLOB 2.5 10/20/2022   AGRATIO 2.0 05/07/2022   BILITOT 0.6 10/31/2022   ALKPHOS 101 10/31/2022   AST 26 10/31/2022   ALT 18 10/31/2022   ANIONGAP 10 10/31/2022   Last lipids Lab Results  Component Value Date   CHOL 122 11/01/2022    HDL 45 11/01/2022   LDLCALC 55 11/01/2022   TRIG 109 11/01/2022   CHOLHDL 2.7 11/01/2022   Last hemoglobin A1c Lab Results  Component Value Date   HGBA1C 6.4 (H) 10/31/2022   Last thyroid functions Lab Results  Component Value Date   TSH 1.150 10/20/2022   T4TOTAL 11.5 02/20/2022        Hannah Mcdaniel is  57 yrs old female, no acute distress Breast Lump: STAT Mammogram order Herpes labialis: culture order and antiviral started while waiting fro results Labs: LFT  Encourage  healthy lifestyle choices, including diet (rich in fruits, vegetables, and lean proteins, and low in salt and simple carbohydrates) and exercise (at least 30 minutes of moderate physical activity daily).     The above assessment and management plan was discussed with the patient. The patient verbalized understanding of and has agreed to the management plan. Patient is aware to call the clinic if they develop any new symptoms or if symptoms persist or worsen. Patient is aware when to return to the clinic for a follow-up visit. Patient educated on when it is appropriate to go to the emergency department.   Return for follow-up wit PCP as already scheduled.   Arrie Aran Santa Lighter, DNP [facility] [address] [place], [place] [address] [phone] @Nina Hoar  [place], [place]

## 2022-11-18 ENCOUNTER — Ambulatory Visit: Payer: 59 | Admitting: Physician Assistant

## 2022-11-18 ENCOUNTER — Encounter: Payer: Self-pay | Admitting: Physician Assistant

## 2022-11-18 VITALS — BP 110/76 | Resp 20 | Ht 61.0 in

## 2022-11-18 DIAGNOSIS — I6930 Unspecified sequelae of cerebral infarction: Secondary | ICD-10-CM | POA: Diagnosis not present

## 2022-11-18 DIAGNOSIS — R413 Other amnesia: Secondary | ICD-10-CM | POA: Diagnosis not present

## 2022-11-18 DIAGNOSIS — I639 Cerebral infarction, unspecified: Secondary | ICD-10-CM

## 2022-11-18 LAB — HEPATIC FUNCTION PANEL
ALT: 16 [IU]/L (ref 0–32)
AST: 18 [IU]/L (ref 0–40)
Albumin: 4.4 g/dL (ref 3.8–4.9)
Alkaline Phosphatase: 138 [IU]/L — ABNORMAL HIGH (ref 44–121)
Bilirubin Total: 0.2 mg/dL (ref 0.0–1.2)
Total Protein: 7 g/dL (ref 6.0–8.5)

## 2022-11-18 MED ORDER — DONEPEZIL HCL 5 MG PO TABS
5.0000 mg | ORAL_TABLET | Freq: Every day | ORAL | 11 refills | Status: DC
Start: 2022-11-18 — End: 2023-03-17

## 2022-11-18 NOTE — Patient Instructions (Addendum)
It was a pleasure to see you today at our office.   Recommendations:  Continue Plavix and ASA  Start Donepezil 5mg  daily. Side effects discussed   Continue secondary stroke prevention, follow with cardiology  Start B12 supplements  Referral to PT  Follow up Jan 17 at 11:30    For psychiatric meds, mood meds: Please have your primary care physician manage these medications.  If you have any severe symptoms of a stroke, or other severe issues such as confusion,severe chills or fever, etc call 911 or go to the ER as you may need to be evaluated further     For assessment of decision of mental capacity and competency:  Call Dr. Erick Blinks, geriatric psychiatrist at [phone]  Counseling regarding caregiver distress, including caregiver depression, anxiety and issues regarding community resources, adult day care programs, adult living facilities, or memory care questions:  please contact your  Primary Doctor's Social Worker   Whom to call: Memory  decline, memory medications: Call our office [phone]    [email or web address]   RECOMMENDATIONS FOR ALL PATIENTS WITH MEMORY PROBLEMS: 1. Continue to exercise (Recommend 30 minutes of walking everyday, or 3 hours every week) 2. Increase social interactions - continue going to [place] and enjoy social gatherings with friends and family 3. Eat healthy, avoid fried foods and eat more fruits and vegetables 4. Maintain adequate blood pressure, blood sugar, and blood cholesterol level. Reducing the risk of stroke and cardiovascular disease also helps promoting better memory. 5. Avoid stressful situations. Live a simple life and avoid aggravations. Organize your time and prepare for the next day in anticipation. 6. Sleep well, avoid any interruptions of sleep and avoid any distractions in the bedroom that may interfere with adequate sleep quality 7. Avoid sugar, avoid sweets as there is a  strong link between excessive sugar intake, diabetes, and cognitive impairment We discussed the Mediterranean diet, which has been shown to help patients reduce the risk of progressive memory disorders and reduces cardiovascular risk. This includes eating fish, eat fruits and green leafy vegetables, nuts like almonds and hazelnuts, walnuts, and also use olive oil. Avoid fast foods and fried foods as much as possible. Avoid sweets and sugar as sugar use has been linked to worsening of memory function.  There is always a concern of gradual progression of memory problems. If this is the case, then we may need to adjust level of care according to patient needs. Support, both to the patient and caregiver, should then be put into place.      You have been referred for a neuropsychological evaluation (i.e., evaluation of memory and thinking abilities). Please bring someone with you to this appointment if possible, as it is helpful for the doctor to hear from both you and another adult who knows you well. Please bring eyeglasses and hearing aids if you wear them.    The evaluation will take approximately 3 hours and has two parts:   The first part is a clinical interview with the neuropsychologist (Dr. Milbert Coulter or Dr. Roseanne Reno). During the interview, the neuropsychologist will speak with you and the individual you brought to the appointment.    The second part of the evaluation is testing with the doctor's technician Annabelle Harman or Selena Batten). During the testing, the technician will ask you to remember different types of material, solve problems, and answer some questionnaires. Your family member will not be present for this portion of the evaluation.   Please note: We must reserve several hours of  the neuropsychologist's time and the psychometrician's time for your evaluation appointment. As such, there is a No-Show fee of $100. If you are unable to attend any of your appointments, please contact our office as soon as  possible to reschedule.      DRIVING: Regarding driving, in patients with progressive memory problems, driving will be impaired. We advise to have someone else do the driving if trouble finding directions or if minor accidents are reported. Independent driving assessment is available to determine safety of driving.   If you are interested in the driving assessment, you can contact the following:  [facility] in [address] [phone]  [facility] [phone]  [facility] [phone]  [facility] [phone] or [phone]   FALL PRECAUTIONS: Be cautious when walking. Scan the area for obstacles that may increase the risk of trips and falls. When getting up in the mornings, sit up at the edge of the bed for a few minutes before getting out of bed. Consider elevating the bed at the head end to avoid drop of blood pressure when getting up. Walk always in a well-lit room (use night lights in the walls). Avoid area rugs or power cords from appliances in the middle of the walkways. Use a walker or a cane if necessary and consider physical therapy for balance exercise. Get your eyesight checked regularly.  FINANCIAL OVERSIGHT: Supervision, especially oversight when making financial decisions or transactions is also recommended.  HOME SAFETY: Consider the safety of the kitchen when operating appliances like stoves, microwave oven, and blender. Consider having supervision and share cooking responsibilities until no longer able to participate in those. Accidents with firearms and other hazards in the house should be identified and addressed as well.   ABILITY TO BE LEFT ALONE: If patient is unable to contact 911 operator, consider using LifeLine, or when the need is there, arrange for someone to stay with patients. Smoking is a fire hazard, consider supervision or cessation. Risk of wandering should be assessed by caregiver and if detected at any  point, supervision and safe proof recommendations should be instituted.  MEDICATION SUPERVISION: Inability to self-administer medication needs to be constantly addressed. Implement a mechanism to ensure safe administration of the medications.      Mediterranean Diet A Mediterranean diet refers to food and lifestyle choices that are based on the traditions of countries located on the [employer]. This way of eating has been shown to help prevent certain conditions and improve outcomes for people who have chronic diseases, like kidney disease and heart disease. What are tips for following this plan? Lifestyle  Cook and eat meals together with your family, when possible. Drink enough fluid to keep your urine clear or pale yellow. Be physically active every day. This includes: Aerobic exercise like running or swimming. Leisure activities like gardening, walking, or housework. Get 7-8 hours of sleep each night. If recommended by your health care provider, drink red wine in moderation. This means 1 glass a day for nonpregnant women and 2 glasses a day for men. A glass of wine equals 5 oz (150 mL). Reading food labels  Check the serving size of packaged foods. For foods such as rice and pasta, the serving size refers to the amount of cooked product, not dry. Check the total fat in packaged foods. Avoid foods that have saturated fat or trans fats. Check the ingredients list for added sugars, such as corn syrup. Shopping  At the grocery store, buy most of  your food from the areas near the walls of the store. This includes: Fresh fruits and vegetables (produce). Grains, beans, nuts, and seeds. Some of these may be available in unpackaged forms or large amounts (in bulk). Fresh seafood. Poultry and eggs. Low-fat dairy products. Buy whole ingredients instead of prepackaged foods. Buy fresh fruits and vegetables in-season from local farmers markets. Buy frozen fruits and vegetables in  resealable bags. If you do not have access to quality fresh seafood, buy precooked frozen shrimp or canned fish, such as tuna, salmon, or sardines. Buy small amounts of raw or cooked vegetables, salads, or olives from the deli or salad bar at your store. Stock your pantry so you always have certain foods on hand, such as olive oil, canned tuna, canned tomatoes, rice, pasta, and beans. Cooking  Cook foods with extra-virgin olive oil instead of using butter or other vegetable oils. Have meat as a side dish, and have vegetables or grains as your main dish. This means having meat in small portions or adding small amounts of meat to foods like pasta or stew. Use beans or vegetables instead of meat in common dishes like chili or lasagna. Experiment with different cooking methods. Try roasting or broiling vegetables instead of steaming or sauteing them. Add frozen vegetables to soups, stews, pasta, or rice. Add nuts or seeds for added healthy fat at each meal. You can add these to yogurt, salads, or vegetable dishes. Marinate fish or vegetables using olive oil, lemon juice, garlic, and fresh herbs. Meal planning  Plan to eat 1 vegetarian meal one day each week. Try to work up to 2 vegetarian meals, if possible. Eat seafood 2 or more times a week. Have healthy snacks readily available, such as: Vegetable sticks with hummus. Greek yogurt. Fruit and nut trail mix. Eat balanced meals throughout the week. This includes: Fruit: 2-3 servings a day Vegetables: 4-5 servings a day Low-fat dairy: 2 servings a day Fish, poultry, or lean meat: 1 serving a day Beans and legumes: 2 or more servings a week Nuts and seeds: 1-2 servings a day Whole grains: 6-8 servings a day Extra-virgin olive oil: 3-4 servings a day Limit red meat and sweets to only a few servings a month What are my food choices? Mediterranean diet Recommended Grains: Whole-grain pasta. Brown rice. Bulgar wheat. Polenta. Couscous.  Whole-wheat bread. Orpah Cobb. Vegetables: Artichokes. Beets. Broccoli. Cabbage. Carrots. Eggplant. Green beans. Chard. Kale. Spinach. Onions. Leeks. Peas. Squash. Tomatoes. Peppers. Radishes. Fruits: Apples. Apricots. Avocado. Berries. Bananas. Cherries. Dates. Figs. Grapes. Lemons. Melon. Oranges. Peaches. Plums. Pomegranate. Meats and other protein foods: Beans. Almonds. Sunflower seeds. Pine nuts. Peanuts. Cod. Salmon. Scallops. Shrimp. Tuna. Tilapia. Clams. Oysters. Eggs. Dairy: Low-fat milk. Cheese. Greek yogurt. Beverages: Water. Red wine. Herbal tea. Fats and oils: Extra virgin olive oil. Avocado oil. Grape seed oil. Sweets and desserts: [place] yogurt with honey. Baked apples. Poached pears. Trail mix. Seasoning and other foods: Basil. Cilantro. Coriander. Cumin. Mint. Parsley. Sage. Rosemary. Tarragon. Garlic. Oregano. Thyme. Pepper. Balsalmic vinegar. Tahini. Hummus. Tomato sauce. Olives. Mushrooms. Limit these Grains: Prepackaged pasta or rice dishes. Prepackaged cereal with added sugar. Vegetables: Deep fried potatoes (french fries). Fruits: Fruit canned in syrup. Meats and other protein foods: Beef. Pork. Lamb. Poultry with skin. Hot dogs. Tomasa Blase. Dairy: Ice cream. Sour cream. Whole milk. Beverages: Juice. Sugar-sweetened soft drinks. Beer. Liquor and spirits. Fats and oils: Butter. Canola oil. Vegetable oil. Beef fat (tallow). Lard. Sweets and desserts: Cookies. Cakes. Pies. Candy. Seasoning and other foods:  Mayonnaise. Premade sauces and marinades. The items listed may not be a complete list. Talk with your dietitian about what dietary choices are right for you. Summary The Mediterranean diet includes both food and lifestyle choices. Eat a variety of fresh fruits and vegetables, beans, nuts, seeds, and whole grains. Limit the amount of red meat and sweets that you eat. Talk with your health care provider about whether it is safe for you to drink red wine in moderation. This  means 1 glass a day for nonpregnant women and 2 glasses a day for men. A glass of wine equals 5 oz (150 mL). This information is not intended to replace advice given to you by your health care provider. Make sure you discuss any questions you have with your health care provider. Document Released: 09/13/2015 Document Revised: 10/16/2015 Document Reviewed: 09/13/2015 Elsevier Interactive Patient Education  2017 [employer].
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
## 2022-11-18 NOTE — Telephone Encounter (Signed)
Patient aware [facility] can not take patient at this time.

## 2022-11-19 LAB — HERPES SIMPLEX VIRUS CULTURE

## 2022-11-21 ENCOUNTER — Telehealth: Payer: Self-pay | Admitting: Family Medicine

## 2022-11-21 NOTE — Telephone Encounter (Signed)
Faxed again - per daughter they do not accept insurance - she would like appt at [facility] - see other note in chart

## 2022-11-21 NOTE — Telephone Encounter (Signed)
Spoke to daughter - Hannah Mcdaniel never called to confirm appt or insurance information. They do not accept [employer]. Orders will be faxed to [facility] and they will contact patient to schedule at their location.

## 2022-11-26 ENCOUNTER — Encounter: Payer: Self-pay | Admitting: Nurse Practitioner

## 2022-12-08 ENCOUNTER — Ambulatory Visit (INDEPENDENT_AMBULATORY_CARE_PROVIDER_SITE_OTHER): Payer: 59

## 2022-12-08 DIAGNOSIS — I639 Cerebral infarction, unspecified: Secondary | ICD-10-CM

## 2022-12-08 LAB — CUP PACEART REMOTE DEVICE CHECK
Date Time Interrogation Session: 20241102230437
Implantable Pulse Generator Implant Date: 20230508

## 2022-12-15 ENCOUNTER — Ambulatory Visit
Admission: RE | Admit: 2022-12-15 | Discharge: 2022-12-15 | Disposition: A | Discharge status: Home / Self Care | Payer: 59 | Source: Ambulatory Visit | Attending: Family Medicine | Admitting: Family Medicine

## 2022-12-15 ENCOUNTER — Ambulatory Visit
Admission: RE | Admit: 2022-12-15 | Discharge: 2022-12-15 | Disposition: A | Discharge status: Home / Self Care | Payer: 59 | Source: Ambulatory Visit | Attending: Nurse Practitioner | Admitting: Nurse Practitioner

## 2022-12-15 DIAGNOSIS — N6322 Unspecified lump in the left breast, upper inner quadrant: Secondary | ICD-10-CM

## 2022-12-15 DIAGNOSIS — N6452 Nipple discharge: Secondary | ICD-10-CM

## 2022-12-15 DIAGNOSIS — N6002 Solitary cyst of left breast: Secondary | ICD-10-CM | POA: Diagnosis not present

## 2022-12-19 ENCOUNTER — Ambulatory Visit (INDEPENDENT_AMBULATORY_CARE_PROVIDER_SITE_OTHER): Payer: 59 | Admitting: Family Medicine

## 2022-12-19 ENCOUNTER — Encounter: Payer: Self-pay | Admitting: Family Medicine

## 2022-12-19 VITALS — BP 113/67 | HR 73 | Temp 98.6°F | Ht 61.0 in | Wt 134.0 lb

## 2022-12-19 DIAGNOSIS — E785 Hyperlipidemia, unspecified: Secondary | ICD-10-CM | POA: Diagnosis not present

## 2022-12-19 DIAGNOSIS — H539 Unspecified visual disturbance: Secondary | ICD-10-CM | POA: Diagnosis not present

## 2022-12-19 DIAGNOSIS — E559 Vitamin D deficiency, unspecified: Secondary | ICD-10-CM | POA: Diagnosis not present

## 2022-12-19 DIAGNOSIS — F411 Generalized anxiety disorder: Secondary | ICD-10-CM | POA: Diagnosis not present

## 2022-12-19 DIAGNOSIS — I152 Hypertension secondary to endocrine disorders: Secondary | ICD-10-CM

## 2022-12-19 DIAGNOSIS — E1159 Type 2 diabetes mellitus with other circulatory complications: Secondary | ICD-10-CM | POA: Diagnosis not present

## 2022-12-19 DIAGNOSIS — E1169 Type 2 diabetes mellitus with other specified complication: Secondary | ICD-10-CM

## 2022-12-19 DIAGNOSIS — F17200 Nicotine dependence, unspecified, uncomplicated: Secondary | ICD-10-CM | POA: Diagnosis not present

## 2022-12-19 DIAGNOSIS — Z8673 Personal history of transient ischemic attack (TIA), and cerebral infarction without residual deficits: Secondary | ICD-10-CM | POA: Diagnosis not present

## 2022-12-19 DIAGNOSIS — F331 Major depressive disorder, recurrent, moderate: Secondary | ICD-10-CM | POA: Diagnosis not present

## 2022-12-19 NOTE — Progress Notes (Signed)
Subjective:  Patient ID: Hannah Mcdaniel, female    DOB: 03-02-1965, 57 y.o.   MRN: [id number]  Patient Care Team: Arrie Senate, FNP as PCP - General (Family Medicine) Quintella Reichert, MD as PCP - Cardiology (Cardiology) Chuck Hint, MD (Inactive) as Consulting Physician (Vascular Surgery) Michaelle Copas, MD as Referring Physician (Optometry)   Chief Complaint:  chronic condition follow up   HPI: Hannah Mcdaniel is a 57 y.o. female presenting on 12/19/2022 for chronic condition follow up   HPI 1. Hypertension associated with diabetes (HCC) Has BP monitor at home Yes BP at home average 110s/70s ROS Denies anxiety, fatigue, peripheral edema, changes to vision, chest pain, headaches, palpitations, sweats, SOB, PND, orthopnea States that left eye is "weak" since CVA  Meds losartan and hydrochlorothiazide  CAD risks prior MI, smoker, Diabetes Mellitus, hypertension, hypercholesterolemia/hyperlipidemia  2. Type 2 diabetes mellitus with other specified complication, without long-term current use of insulin (HCC) Glucometer:unsure of brand   High at home: 99; Low at home: Maybe? 35, Taking medication(s): Jardiance ,. Patient reports values of 26 without symptoms. Husband unsure of results.  Last eye exam: completed January 2024  Last foot exam: 09/18/2022 Last A1c:  Lab Results  Component Value Date   HGBA1C 6.4 (H) 10/31/2022   Nephropathy screen indicated?: yes  Last flu, zoster and/or pneumovax:  Immunization History  Administered Date(s) Administered   Influenza, Seasonal, Injecte, Preservative Fre 11/01/2022   Influenza,inj,Quad PF,6+ Mos 01/20/2017, 11/07/2020   Influenza-Unspecified 01/20/2017   Moderna Sars-Covid-2 Vaccination 06/14/2019, 07/12/2019   PNEUMOCOCCAL CONJUGATE-20 11/07/2020   Pneumococcal Polysaccharide-23 04/23/2015   Tdap 04/23/2010    ROS: Denies dizziness, LOC, polyuria, polydipsia, unintended weight loss/gain, foot  ulcerations, numbness or tingling in extremities, shortness of breath or chest pain. Endorses numbness and weakness in left side that has been present since earlier CVA.   3. Hyperlipidemia associated with type 2 diabetes mellitus (HCC) Lipid/Cholesterol, Follow-up  Last lipid panel Other pertinent labs  Lab Results  Component Value Date   CHOL 122 11/01/2022   HDL 45 11/01/2022   LDLCALC 55 11/01/2022   TRIG 109 11/01/2022   CHOLHDL 2.7 11/01/2022   Lab Results  Component Value Date   ALT 16 11/17/2022   AST 18 11/17/2022   PLT 267 10/31/2022   TSH 1.150 10/20/2022     She was last seen for this 3 months ago.  Management since that visit includes zetia  Previously on Praluent, but has not been taking since recent hospitalization   She reports excellent compliance with treatment. She is not having side effects.   Symptoms: No chest pain No chest pressure/discomfort  No dyspnea No lower extremity edema  Yes numbness or tingling of extremity No orthopnea  No palpitations No paroxysmal nocturnal dyspnea  Yes speech difficulty No syncope   Current diet: in general, an "unhealthy" diet Current exercise: walking  The ASCVD Risk score (Arnett DK, et al., 2019) failed to calculate for the following reasons:   The patient has a prior MI or stroke diagnosis ---------------------------------------------------------------------------------------------------  4. Anxiety, generalized/Major depressive disorder, recurrent episode, moderate (HCC) States that symptoms are well controlled. Has stopped taking atarax. She denies SI. Declines counseling.   5. Smoker States that she is vaping, but it is nicotine free.  States that she is using it less than she was before.   6. History of CVA (cerebrovascular accident) Patient experienced first cryptogenic CVA 03/21/2021. She was evaluated for a second  CVA on 03/25/2021. She was then treated for recurrent acute on subacute CVA on 04/11/2021.  Most recent, Fourth CVA 10/31/22.  Established with Neurology. Last seen 11/18/2022. Patient instructed to start donepezil 5mg  daily, PT, and B12 supplementation.  States that she is taking an OTC supplementation.  She has graduated from PT/OT. States that it did not help her symptoms.  Has started Aricept from neurology for memory problems. Reports that it is helping some.  She states that she has had some changes to her left eye, it is "weak" would like to follow up with specialist.     7. Vitamin D Deficiency  Is not currently taking a supplement.  Denies symptoms.   Relevant past medical, surgical, family, and social history reviewed and updated as indicated.  Allergies and medications reviewed and updated. Data reviewed: Chart in Epic.   Past Medical History:  Diagnosis Date   Anxiety    Arterial occlusive disease Nov. 2014   Arthritis    Claudication of lower extremity [facility]) Nov. 2014   Right Lower Extremity rest pain   Colon polyps    Depression    Diabetes mellitus without complication (HCC)    GERD (gastroesophageal reflux disease)    Hyperlipidemia    Migraines    Vertigo     Past Surgical History:  Procedure Laterality Date   ABDOMINAL AORTAGRAM N/A 01/10/2013   Procedure: ABDOMINAL AORTAGRAM;  Surgeon: Larina Earthly, MD;  Location: [facility] CATH LAB;  Service: Cardiovascular;  Laterality: N/A;   ABDOMINAL AORTOGRAM W/LOWER EXTREMITY Bilateral 07/16/2018   Procedure: ABDOMINAL AORTOGRAM W/LOWER EXTREMITY;  Surgeon: Chuck Hint, MD;  Location: [facility] INVASIVE CV LAB;  Service: Cardiovascular;  Laterality: Bilateral;   ABDOMINAL AORTOGRAM W/LOWER EXTREMITY N/A 10/05/2020   Procedure: ABDOMINAL AORTOGRAM W/LOWER EXTREMITY;  Surgeon: Chuck Hint, MD;  Location: [facility] INVASIVE CV LAB;  Service: Cardiovascular;  Laterality: N/A;   BUBBLE STUDY  03/26/2021   Procedure: BUBBLE STUDY;  Surgeon: Little Ishikawa, MD;  Location: [facility] ENDOSCOPY;  Service:  Cardiovascular;;   CHOLECYSTECTOMY     Gall Bladder   iliac artery angioplasty and stent placement  01/10/13   LOWER EXTREMITY ANGIOGRAM Bilateral 01/10/2013   Procedure: LOWER EXTREMITY ANGIOGRAM;  Surgeon: Larina Earthly, MD;  Location: [facility] CATH LAB;  Service: Cardiovascular;  Laterality: Bilateral;   PERCUTANEOUS STENT INTERVENTION Right 01/10/2013   Procedure: PERCUTANEOUS STENT INTERVENTION;  Surgeon: Larina Earthly, MD;  Location: [facility] CATH LAB;  Service: Cardiovascular;  Laterality: Right;  rt common iliac stent   PERIPHERAL VASCULAR CATHETERIZATION N/A 11/26/2015   Procedure: Abdominal Aortogram w/Lower Extremity;  Surgeon: Chuck Hint, MD;  Location: [facility] INVASIVE CV LAB;  Service: Cardiovascular;  Laterality: N/A;   PERIPHERAL VASCULAR CATHETERIZATION Right 11/26/2015   Procedure: Peripheral Vascular Balloon Angioplasty;  Surgeon: Chuck Hint, MD;  Location: [facility] INVASIVE CV LAB;  Service: Cardiovascular;  Laterality: Right;  rt common iliac   PERIPHERAL VASCULAR INTERVENTION  07/16/2018   Procedure: PERIPHERAL VASCULAR INTERVENTION;  Surgeon: Chuck Hint, MD;  Location: [facility] INVASIVE CV LAB;  Service: Cardiovascular;;   PERIPHERAL VASCULAR INTERVENTION Right 10/05/2020   Procedure: PERIPHERAL VASCULAR INTERVENTION;  Surgeon: Chuck Hint, MD;  Location: [facility] INVASIVE CV LAB;  Service: Cardiovascular;  Laterality: Right;  Common and exteral iliac artery   TEE WITHOUT CARDIOVERSION N/A 03/26/2021   Procedure: TRANSESOPHAGEAL ECHOCARDIOGRAM (TEE);  Surgeon: Little Ishikawa, MD;  Location: [facility] ENDOSCOPY;  Service: Cardiovascular;  Laterality: N/A;   TOTAL ABDOMINAL HYSTERECTOMY  Social History   Socioeconomic History   Marital status: Married    Spouse name: Not on file   Number of children: 3   Years of education: Not on file   Highest education level: 10th grade  Occupational History   Not on file  Tobacco Use   Smoking status: Former     Current packs/day: 0.00    Average packs/day: 0.3 packs/day for 43.8 years (11.0 ttl pk-yrs)    Types: Cigarettes    Start date: 05/16/1977    Quit date: 03/21/2021    Years since quitting: 1.7   Smokeless tobacco: Never   Tobacco comments:    1/2 cigarette per day  Vaping Use   Vaping status: Never Used  Substance and Sexual Activity   Alcohol use: Yes    Alcohol/week: 1.0 standard drink of alcohol    Types: 1 Shots of liquor per week    Comment: occ   Drug use: No   Sexual activity: Yes    Partners: Male    Birth control/protection: Surgical  Other Topics Concern   Not on file  Social History Narrative   Right handed   Lives with husband   Not working   Drinks caffeine prn   One floor home   Social Determinants of Health   Financial Resource Strain: Low Risk  (05/03/2022)   Overall Financial Resource Strain (CARDIA)    Difficulty of Paying Living Expenses: Not hard at all  Food Insecurity: No Food Insecurity (10/31/2022)   Hunger Vital Sign    Worried About Running Out of Food in the Last Year: Never true    Ran Out of Food in the Last Year: Never true  Transportation Needs: No Transportation Needs (10/31/2022)   PRAPARE - Administrator, Civil Service (Medical): No    Lack of Transportation (Non-Medical): No  Physical Activity: Insufficiently Active (05/03/2022)   Exercise Vital Sign    Days of Exercise per Week: 1 day    Minutes of Exercise per Session: 20 min  Stress: No Stress Concern Present (05/03/2022)   [employer] of Occupational Health - Occupational Stress Questionnaire    Feeling of Stress : Not at all  Social Connections: Moderately Isolated (05/03/2022)   Social Connection and Isolation Panel [NHANES]    Frequency of Communication with Friends and Family: Three times a week    Frequency of Social Gatherings with Friends and Family: Once a week    Attends Religious Services: Never    Database administrator or Organizations: No    Attends  Engineer, structural: Not on file    Marital Status: Married  Catering manager Violence: Not At Risk (10/31/2022)   Humiliation, Afraid, Rape, and Kick questionnaire    Fear of Current or Ex-Partner: No    Emotionally Abused: No    Physically Abused: No    Sexually Abused: No    Outpatient Encounter Medications as of 12/19/2022  Medication Sig   acyclovir (ZOVIRAX) 400 MG tablet Take 1 tablet (400 mg total) by mouth 3 (three) times daily.   Alirocumab (PRALUENT) 150 MG/ML SOAJ Inject 1 mL (150 mg total) into the skin every 14 (fourteen) days. (Patient not taking: Reported on 11/18/2022)   aspirin EC 81 MG tablet Take 1 tablet (81 mg total) by mouth daily. Swallow whole.   donepezil (ARICEPT) 5 MG tablet Take 1 tablet (5 mg total) by mouth daily.   empagliflozin (JARDIANCE) 10 MG TABS tablet Take 1 tablet (10  mg total) by mouth daily.   ezetimibe (ZETIA) 10 MG tablet Take 1 tablet (10 mg total) by mouth daily.   hydrochlorothiazide (HYDRODIURIL) 25 MG tablet Take 1 tablet (25 mg total) by mouth daily.   hydrOXYzine (VISTARIL) 25 MG capsule Take 50 mg by mouth every 8 (eight) hours as needed.   losartan (COZAAR) 50 MG tablet TAKE 2 TABLETS BY MOUTH EVERY DAY   montelukast (SINGULAIR) 10 MG tablet Take 1 tablet (10 mg total) by mouth at bedtime.   No facility-administered encounter medications on file as of 12/19/2022.    Allergies  Allergen Reactions   Crestor [Rosuvastatin] Other (See Comments)    Severe muscle pain   Asa [Aspirin] Rash    Can take low-dose aspirin.   Bempedoic Acid Other (See Comments)    Bilateral shoulder pain.   Codeine Rash   Penicillins Rash    Has patient had a PCN reaction causing immediate rash, facial/tongue/throat swelling, SOB or lightheadedness with hypotension:Yes Has patient had a PCN reaction causing severe rash involving mucus membranes or skin necrosis:Yes Has patient had a PCN reaction that required hospitalization:No Has patient had a  PCN reaction occurring within the last 10 years:No If all of the above answers are "NO", then may proceed with Cephalosporin use.     Review of Systems As per HPI  Objective:  BP 113/67   Pulse 73   Temp 98.6 F (37 C)   Ht 5\' 1"  (1.549 m)   Wt 134 lb (60.8 kg)   SpO2 97%   BMI 25.32 kg/m    Wt Readings from Last 3 Encounters:  11/17/22 133 lb 6.4 oz (60.5 kg)  10/31/22 135 lb (61.2 kg)  10/28/22 134 lb (60.8 kg)    Physical Exam Constitutional:      General: She is awake. She is not in acute distress.    Appearance: Normal appearance. She is well-developed and well-groomed. She is ill-appearing. She is not toxic-appearing or diaphoretic.     Comments: Chronically ill-appearing   HENT:     Head:     Comments: Wears glasses  Cardiovascular:     Rate and Rhythm: Normal rate and regular rhythm.     Pulses:          Radial pulses are 2+ on the right side and 2+ on the left side.     Heart sounds: Normal heart sounds.  Pulmonary:     Effort: Pulmonary effort is normal.     Breath sounds: Normal breath sounds. No stridor, decreased air movement or transmitted upper airway sounds. No decreased breath sounds, wheezing, rhonchi or rales.  Musculoskeletal:     Right lower leg: No edema.     Left lower leg: No edema.  Lymphadenopathy:     Head:     Right side of head: No submental, submandibular, tonsillar, preauricular or posterior auricular adenopathy.     Left side of head: No submental, submandibular, tonsillar, preauricular or posterior auricular adenopathy.  Skin:    General: Skin is warm.     Capillary Refill: Capillary refill takes less than 2 seconds.  Neurological:     Mental Status: She is alert, oriented to person, place, and time and easily aroused.     Cranial Nerves: Cranial nerve deficit present. No facial asymmetry.     Motor: Weakness, atrophy and abnormal muscle tone present. No tremor, seizure activity or pronator drift.     Coordination: Coordination  abnormal.     Gait: Gait abnormal  and tandem walk abnormal.     Comments: Decreased arm strength in left arm.  Decreased leg strength in left leg.   Psychiatric:        Attention and Perception: Attention and perception normal.        Mood and Affect: Mood and affect normal.        Speech: Speech is slurred.        Behavior: Behavior is cooperative.        Thought Content: Thought content normal.        Cognition and Memory: Memory is impaired. She exhibits impaired recent memory.        Judgment: Judgment normal.     Comments: Very mild speech difficulty     Results for orders placed or performed in visit on 12/08/22  CUP PACEART REMOTE DEVICE CHECK  Result Value Ref Range   Date Time Interrogation Session [phone]    Pulse Generator Manufacturer MERM    Pulse Gen Model LNQ22 LINQ II    Pulse Gen Serial Number [id number]    Clinic Name [facility]    Implantable Pulse Generator Type ICM/ILR    Implantable Pulse Generator Implant Date [id number]        12/19/2022    1:10 PM 09/18/2022    8:06 AM 08/06/2022   10:37 AM 06/04/2022    9:17 AM 05/07/2022    9:47 AM  Depression screen PHQ 2/9  Decreased Interest 0 0 3 2 2   Down, Depressed, Hopeless 0 0 3 1 1   PHQ - 2 Score 0 0 6 3 3   Altered sleeping 1 2 2 3  0  Tired, decreased energy 1 2 3  0 3  Change in appetite 0 0 2 0 0  Feeling bad or failure about yourself  0 0 3 0 0  Trouble concentrating 0 0 2 0 0  Moving slowly or fidgety/restless 0 0 0 0 0  Suicidal thoughts 0 0 0 0 0  PHQ-9 Score 2 4 18 6 6   Difficult doing work/chores  Not difficult at all Very difficult Somewhat difficult Somewhat difficult       12/19/2022    1:10 PM 09/18/2022    8:06 AM 08/06/2022   10:38 AM 06/04/2022    9:17 AM  GAD 7 : Generalized Anxiety Score  Nervous, Anxious, on Edge 0 0 3 0  Control/stop worrying 0 0 2 0  Worry too much - different things 0 0 1 0  Trouble relaxing 1 2 3  0  Restless 0 0 2 0  Easily annoyed or irritable 1 2 3  0   Afraid - awful might happen 0 0 0 0  Total GAD 7 Score 2 4 14  0  Anxiety Difficulty Somewhat difficult Not difficult at all Very difficult Not difficult at all   Pertinent labs & imaging results that were available during my care of the patient were reviewed by me and considered in my medical decision making.  Assessment & Plan:  Carolin was seen today for chronic condition follow up .  Diagnoses and all orders for this visit:  Hypertension associated with diabetes (HCC) Well controlled. Continue current regimen.   Type 2 diabetes mellitus with other specified complication, without long-term current use of insulin (HCC) Well controlled during hospitalization. Reviewed labs from 10/31/22. Patient to follow up in 2 months for repeat labs. Patient to monitor BG at home and keep a log. Patient to call with log results in 1-2 weeks.   Hyperlipidemia associated with  type 2 diabetes mellitus (HCC) Patient to continue on zetia. Will complete labs at next appt. Will reach out to cardiology to determine need for Praluent.   Anxiety, generalized Well controlled. Symptoms stable. Denies SI. Continue current regimen.   Major depressive disorder, recurrent episode, moderate (HCC) Well controlled. Symptoms stable. Denies SI. Continue current regimen.   Smoker Praised patient on her discontinuation of tobacco and nicotine. Encouraged patient to reduce/cease use of vape as well.   History of CVA (cerebrovascular accident) Established with Neurology. Reviewed notes from [place], [place] on 11/18/22. Patient to continue to follow with specialty. Declines additional needs such as referral to PT/OT/ST  Vitamin D deficiency Encouraged patient to start OTC supplement daily.  Will repeat labs at next visit.   Vision abnormalities Referral placed as below.  -     Ambulatory referral to Ophthalmology  Continue all other maintenance medications.  Follow up plan: Return in about 2 months (around  02/18/2023) for Chronic Condition Follow up (with labs) .   Continue healthy lifestyle choices, including diet (rich in fruits, vegetables, and lean proteins, and low in salt and simple carbohydrates) and exercise (at least 30 minutes of moderate physical activity daily).  Written and verbal instructions provided   The above assessment and management plan was discussed with the patient. The patient verbalized understanding of and has agreed to the management plan. Patient is aware to call the clinic if they develop any new symptoms or if symptoms persist or worsen. Patient is aware when to return to the clinic for a follow-up visit. Patient educated on when it is appropriate to go to the emergency department.   Neale Burly, DNP-FNP [facility] [address] [place], [place] [address] [phone]

## 2022-12-19 NOTE — Patient Instructions (Addendum)
Start Vitamin D3 OTC [phone] international units daily   Let me know if you are out of Aricept/Donepezil

## 2022-12-22 ENCOUNTER — Ambulatory Visit: Payer: 59 | Admitting: Physician Assistant

## 2022-12-29 NOTE — Progress Notes (Signed)
Carelink Summary Report / Loop Recorder 

## 2023-01-10 ENCOUNTER — Other Ambulatory Visit: Payer: Self-pay | Admitting: Family Medicine

## 2023-01-10 DIAGNOSIS — I152 Hypertension secondary to endocrine disorders: Secondary | ICD-10-CM

## 2023-01-10 DIAGNOSIS — I10 Essential (primary) hypertension: Secondary | ICD-10-CM

## 2023-01-12 ENCOUNTER — Ambulatory Visit (INDEPENDENT_AMBULATORY_CARE_PROVIDER_SITE_OTHER): Payer: 59

## 2023-01-12 DIAGNOSIS — I639 Cerebral infarction, unspecified: Secondary | ICD-10-CM | POA: Diagnosis not present

## 2023-01-12 LAB — CUP PACEART REMOTE DEVICE CHECK
Date Time Interrogation Session: 20241208231029
Implantable Pulse Generator Implant Date: 20230508

## 2023-01-15 DIAGNOSIS — H2513 Age-related nuclear cataract, bilateral: Secondary | ICD-10-CM | POA: Diagnosis not present

## 2023-01-15 DIAGNOSIS — H53469 Homonymous bilateral field defects, unspecified side: Secondary | ICD-10-CM | POA: Diagnosis not present

## 2023-02-02 ENCOUNTER — Other Ambulatory Visit: Payer: Self-pay | Admitting: Family Medicine

## 2023-02-02 DIAGNOSIS — E1169 Type 2 diabetes mellitus with other specified complication: Secondary | ICD-10-CM

## 2023-02-02 DIAGNOSIS — R809 Proteinuria, unspecified: Secondary | ICD-10-CM

## 2023-02-02 DIAGNOSIS — E1129 Type 2 diabetes mellitus with other diabetic kidney complication: Secondary | ICD-10-CM

## 2023-02-16 ENCOUNTER — Ambulatory Visit (INDEPENDENT_AMBULATORY_CARE_PROVIDER_SITE_OTHER): Payer: Commercial Managed Care - HMO

## 2023-02-16 DIAGNOSIS — I639 Cerebral infarction, unspecified: Secondary | ICD-10-CM | POA: Diagnosis not present

## 2023-02-16 LAB — CUP PACEART REMOTE DEVICE CHECK
Date Time Interrogation Session: 20250112231504
Implantable Pulse Generator Implant Date: 20230508

## 2023-02-20 ENCOUNTER — Ambulatory Visit: Payer: 59 | Admitting: Physician Assistant

## 2023-03-10 ENCOUNTER — Ambulatory Visit: Payer: Commercial Managed Care - HMO | Admitting: Family Medicine

## 2023-03-10 ENCOUNTER — Encounter: Payer: Self-pay | Admitting: Family Medicine

## 2023-03-10 VITALS — BP 106/63 | HR 98 | Temp 97.4°F | Ht 61.0 in | Wt 141.0 lb

## 2023-03-10 DIAGNOSIS — Z87891 Personal history of nicotine dependence: Secondary | ICD-10-CM

## 2023-03-10 DIAGNOSIS — E1159 Type 2 diabetes mellitus with other circulatory complications: Secondary | ICD-10-CM | POA: Diagnosis not present

## 2023-03-10 DIAGNOSIS — R809 Proteinuria, unspecified: Secondary | ICD-10-CM

## 2023-03-10 DIAGNOSIS — E785 Hyperlipidemia, unspecified: Secondary | ICD-10-CM

## 2023-03-10 DIAGNOSIS — F411 Generalized anxiety disorder: Secondary | ICD-10-CM | POA: Diagnosis not present

## 2023-03-10 DIAGNOSIS — E1169 Type 2 diabetes mellitus with other specified complication: Secondary | ICD-10-CM | POA: Diagnosis not present

## 2023-03-10 DIAGNOSIS — E559 Vitamin D deficiency, unspecified: Secondary | ICD-10-CM

## 2023-03-10 DIAGNOSIS — L0291 Cutaneous abscess, unspecified: Secondary | ICD-10-CM

## 2023-03-10 DIAGNOSIS — F331 Major depressive disorder, recurrent, moderate: Secondary | ICD-10-CM

## 2023-03-10 DIAGNOSIS — Z7984 Long term (current) use of oral hypoglycemic drugs: Secondary | ICD-10-CM

## 2023-03-10 DIAGNOSIS — Z8673 Personal history of transient ischemic attack (TIA), and cerebral infarction without residual deficits: Secondary | ICD-10-CM

## 2023-03-10 DIAGNOSIS — E1129 Type 2 diabetes mellitus with other diabetic kidney complication: Secondary | ICD-10-CM

## 2023-03-10 LAB — BAYER DCA HB A1C WAIVED: HB A1C (BAYER DCA - WAIVED): 6.9 % — ABNORMAL HIGH (ref 4.8–5.6)

## 2023-03-10 LAB — LIPID PANEL

## 2023-03-10 MED ORDER — LANCET DEVICE MISC: 12 refills | Status: AC

## 2023-03-10 MED ORDER — BLOOD GLUCOSE TEST VI STRP: 1.0000 | ORAL_STRIP | Freq: Three times a day (TID) | 0 refills | Status: DC

## 2023-03-10 MED ORDER — DOXYCYCLINE HYCLATE 100 MG PO TABS: 100.0000 mg | ORAL_TABLET | Freq: Two times a day (BID) | ORAL | 0 refills | Status: AC

## 2023-03-10 NOTE — Progress Notes (Signed)
 Subjective:  Patient ID: Hannah Mcdaniel, female    DOB: [birth year], 58 y.o.   MRN: [id number]  Patient Care Team: Cathlene Marry Lenis, FNP as PCP - General (Family Medicine) Shlomo Wilbert SAUNDERS, MD as PCP - Cardiology (Cardiology) Eliza Lonni RAMAN, MD (Inactive) as Consulting Physician (Vascular Surgery) Ladora Ross Lacy Phebe, MD as Referring Physician (Optometry)   Chief Complaint:  Medical Management of Chronic Issues  HPI: Hannah Mcdaniel is a 58 y.o. female presenting on 03/10/2023 for Medical Management of Chronic Issues  HPI 1. Hypertension associated with diabetes (HCC) Has BP monitor at home Yes BP at home average 100/90 per patient  ROS Denies anxiety, fatigue, peripheral edema, changes to vision, chest pain, headaches, palpitations, sweats, SOB, PND, orthopnea Meds losartan  50 mg  CAD risks known cardiac disease, smoker, Diabetes Mellitus, hypertension, hypercholesterolemia/hyperlipidemia  2. Type 2 diabetes mellitus with other specified complication, without long-term current use of insulin  (HCC) Type 2 Diabetes with albuminuria  Glucometer: has fingerstick BG at home. Does not know the brand.   High at home: 14; Low at home: 20, Taking medication(s): none.  States that she does not have symptoms with low BG.   Last eye exam: completed 04/2022 Last foot exam: August 2024 Last A1c:  Lab Results  Component Value Date   HGBA1C 6.4 (H) 10/31/2022   Nephropathy screen indicated?: due Last flu, zoster and/or pneumovax:  Immunization History  Administered Date(s) Administered   Influenza, Seasonal, Injecte, Preservative Fre 11/01/2022   Influenza,inj,Quad PF,6+ Mos 01/20/2017, 11/07/2020   Influenza-Unspecified 01/20/2017   Moderna Sars-Covid-2 Vaccination 06/14/2019, 07/12/2019   PNEUMOCOCCAL CONJUGATE-20 11/07/2020   Pneumococcal Polysaccharide-23 04/23/2015   Tdap 04/23/2010    ROS: Denies dizziness, LOC, polyuria, polydipsia, unintended weight loss/gain,  foot ulcerations, numbness or tingling in extremities, shortness of breath or chest pain.  3. Major depressive disorder, recurrent episode, moderate (HCC)/4. Anxiety, generalized States that depression and anxiety are very well controlled. She denies SI. Declines counseling.   5. Hyperlipidemia associated with type 2 diabetes mellitus (HCC) Lipid/Cholesterol, Follow-up  Last lipid panel Other pertinent labs  Lab Results  Component Value Date   CHOL 122 11/01/2022   HDL 45 11/01/2022   LDLCALC 55 11/01/2022   TRIG 109 11/01/2022   CHOLHDL 2.7 11/01/2022   Lab Results  Component Value Date   ALT 16 11/17/2022   AST 18 11/17/2022   PLT 267 10/31/2022   TSH 1.150 10/20/2022     She was last seen for this 3 months ago.  Management since that visit includes zetia , not able to take praluent   States that she is making dietary changes   She reports good compliance with treatment. She is not having side effects.   Symptoms: No chest pain No chest pressure/discomfort  No dyspnea No lower extremity edema  No numbness or tingling of extremity No orthopnea  No palpitations No paroxysmal nocturnal dyspnea  No speech difficulty No syncope   Current diet: well balanced Current exercise: walking 2-3 times per week for 30 minutes   The ASCVD Risk score (Arnett DK, et al., 2019) failed to calculate for the following reasons:   Risk score cannot be calculated because patient has a medical history suggesting prior/existing ASCVD  ---------------------------------------------------------------------------------------------------   6. Former Smoker Quit smoking. States that she still vapes, but it is nicotine free vaping. States that this is very rare that she vapes. Started smoking at 58 years old. 1 PPD -2 PPD.   7. History of  CVA (cerebrovascular accident) Established with Neurology. She last followed with them on 11/18/22.  She started donepezil  5 mg daily at that time.  She's not  continuing with PT or OT. She never completed SLP. Does not wish to continue at this time. She is not currently taking ASA or praluent . She is unsure of when she needs to follow up with Cardiology. States that she has not followed with them since her loop recorder was placed.  States that she has some numbness in her right hand.   8. Vitamin D  deficiency Vitamin D  deficiency, follow-up  Lab Results  Component Value Date   VD25OH 20.3 (L) 09/18/2022   VD25OH 9.6 (L) 05/07/2022   VD25OH 7.8 (L) 02/04/2022   CALCIUM  9.0 10/31/2022   CALCIUM  9.9 10/20/2022        Wt Readings from Last 3 Encounters:  03/10/23 141 lb (64 kg)  12/19/22 134 lb (60.8 kg)  11/17/22 133 lb 6.4 oz (60.5 kg)    She was last seen for vitamin D  deficiency 3 months ago.  Management since that visit includes none  Symptoms: No change in energy level No numbness or tingling  No bone pain No unexplained fracture   --------------------------------------------------------------------------------------------------- 9. Abscess  States that she had nodule on her back for several years. Reports that it recently started draining. Her husband helped express it and reported that he got a core out. Denies fever, N/V.   Relevant past medical, surgical, family, and social history reviewed and updated as indicated.  Allergies and medications reviewed and updated. Data reviewed: Chart in Epic.   Past Medical History:  Diagnosis Date   Anxiety    Arterial occlusive disease Nov. 2014   Arthritis    Claudication of lower extremity [facility]) Nov. 2014   Right Lower Extremity rest pain   Colon polyps    Depression    Diabetes mellitus without complication (HCC)    GERD (gastroesophageal reflux disease)    Hyperlipidemia    Migraines    Vertigo     Past Surgical History:  Procedure Laterality Date   ABDOMINAL AORTAGRAM N/A 01/10/2013   Procedure: ABDOMINAL AORTAGRAM;  Surgeon: Krystal JULIANNA Doing, MD;  Location: [facility] CATH LAB;   Service: Cardiovascular;  Laterality: N/A;   ABDOMINAL AORTOGRAM W/LOWER EXTREMITY Bilateral 07/16/2018   Procedure: ABDOMINAL AORTOGRAM W/LOWER EXTREMITY;  Surgeon: Eliza Lonni RAMAN, MD;  Location: [facility] INVASIVE CV LAB;  Service: Cardiovascular;  Laterality: Bilateral;   ABDOMINAL AORTOGRAM W/LOWER EXTREMITY N/A 10/05/2020   Procedure: ABDOMINAL AORTOGRAM W/LOWER EXTREMITY;  Surgeon: Eliza Lonni RAMAN, MD;  Location: [facility] INVASIVE CV LAB;  Service: Cardiovascular;  Laterality: N/A;   BUBBLE STUDY  03/26/2021   Procedure: BUBBLE STUDY;  Surgeon: Kate Lonni CROME, MD;  Location: [facility] ENDOSCOPY;  Service: Cardiovascular;;   CHOLECYSTECTOMY     Gall Bladder   iliac artery angioplasty and stent placement  01/10/13   LOWER EXTREMITY ANGIOGRAM Bilateral 01/10/2013   Procedure: LOWER EXTREMITY ANGIOGRAM;  Surgeon: Krystal JULIANNA Doing, MD;  Location: [facility] CATH LAB;  Service: Cardiovascular;  Laterality: Bilateral;   PERCUTANEOUS STENT INTERVENTION Right 01/10/2013   Procedure: PERCUTANEOUS STENT INTERVENTION;  Surgeon: Krystal JULIANNA Doing, MD;  Location: [facility] CATH LAB;  Service: Cardiovascular;  Laterality: Right;  rt common iliac stent   PERIPHERAL VASCULAR CATHETERIZATION N/A 11/26/2015   Procedure: Abdominal Aortogram w/Lower Extremity;  Surgeon: Lonni RAMAN Eliza, MD;  Location: [facility] INVASIVE CV LAB;  Service: Cardiovascular;  Laterality: N/A;   PERIPHERAL VASCULAR CATHETERIZATION Right 11/26/2015  Procedure: Peripheral Vascular Balloon Angioplasty;  Surgeon: Lonni GORMAN Blade, MD;  Location: [facility] INVASIVE CV LAB;  Service: Cardiovascular;  Laterality: Right;  rt common iliac   PERIPHERAL VASCULAR INTERVENTION  07/16/2018   Procedure: PERIPHERAL VASCULAR INTERVENTION;  Surgeon: Blade Lonni GORMAN, MD;  Location: [facility] INVASIVE CV LAB;  Service: Cardiovascular;;   PERIPHERAL VASCULAR INTERVENTION Right 10/05/2020   Procedure: PERIPHERAL VASCULAR INTERVENTION;  Surgeon: Blade Lonni GORMAN, MD;  Location:  [facility] INVASIVE CV LAB;  Service: Cardiovascular;  Laterality: Right;  Common and exteral iliac artery   TEE WITHOUT CARDIOVERSION N/A 03/26/2021   Procedure: TRANSESOPHAGEAL ECHOCARDIOGRAM (TEE);  Surgeon: Kate Lonni CROME, MD;  Location: [facility] ENDOSCOPY;  Service: Cardiovascular;  Laterality: N/A;   TOTAL ABDOMINAL HYSTERECTOMY      Social History   Socioeconomic History   Marital status: Married    Spouse name: Not on file   Number of children: 3   Years of education: Not on file   Highest education level: 10th grade  Occupational History   Not on file  Tobacco Use   Smoking status: Former    Current packs/day: 0.00    Average packs/day: 0.3 packs/day for 43.8 years (11.0 ttl pk-yrs)    Types: Cigarettes    Start date: 05/16/1977    Quit date: 03/21/2021    Years since quitting: 1.9   Smokeless tobacco: Never   Tobacco comments:    1/2 cigarette per day  Vaping Use   Vaping status: Never Used  Substance and Sexual Activity   Alcohol use: Yes    Alcohol/week: 1.0 standard drink of alcohol    Types: 1 Shots of liquor per week    Comment: occ   Drug use: No   Sexual activity: Yes    Partners: Male    Birth control/protection: Surgical  Other Topics Concern   Not on file  Social History Narrative   Right handed   Lives with husband   Not working   Drinks caffeine prn   One floor home   Social Drivers of Health   Financial Resource Strain: Low Risk  (05/03/2022)   Overall Financial Resource Strain (CARDIA)    Difficulty of Paying Living Expenses: Not hard at all  Food Insecurity: No Food Insecurity (10/31/2022)   Hunger Vital Sign    Worried About Running Out of Food in the Last Year: Never true    Ran Out of Food in the Last Year: Never true  Transportation Needs: No Transportation Needs (10/31/2022)   PRAPARE - Administrator, Civil Service (Medical): No    Lack of Transportation (Non-Medical): No  Physical Activity: Insufficiently Active (05/03/2022)    Exercise Vital Sign    Days of Exercise per Week: 1 day    Minutes of Exercise per Session: 20 min  Stress: No Stress Concern Present (05/03/2022)   [employer] of Occupational Health - Occupational Stress Questionnaire    Feeling of Stress : Not at all  Social Connections: Moderately Isolated (05/03/2022)   Social Connection and Isolation Panel [NHANES]    Frequency of Communication with Friends and Family: Three times a week    Frequency of Social Gatherings with Friends and Family: Once a week    Attends Religious Services: Never    Database Administrator or Organizations: No    Attends Banker Meetings: Not on file    Marital Status: Married  Intimate Partner Violence: Not At Risk (10/31/2022)   Humiliation, Afraid, Rape, and Kick  questionnaire    Fear of Current or Ex-Partner: No    Emotionally Abused: No    Physically Abused: No    Sexually Abused: No    Outpatient Encounter Medications as of 03/10/2023  Medication Sig   acyclovir  (ZOVIRAX ) 400 MG tablet Take 1 tablet (400 mg total) by mouth 3 (three) times daily.   Alirocumab  (PRALUENT ) 150 MG/ML SOAJ Inject 1 mL (150 mg total) into the skin every 14 (fourteen) days.   aspirin  EC 81 MG tablet Take 1 tablet (81 mg total) by mouth daily. Swallow whole.   donepezil  (ARICEPT ) 5 MG tablet Take 1 tablet (5 mg total) by mouth daily.   empagliflozin  (JARDIANCE ) 10 MG TABS tablet TAKE 1 TABLET BY MOUTH EVERY DAY   ezetimibe  (ZETIA ) 10 MG tablet Take 1 tablet (10 mg total) by mouth daily.   losartan  (COZAAR ) 50 MG tablet TAKE 2 TABLETS BY MOUTH EVERY DAY   montelukast  (SINGULAIR ) 10 MG tablet Take 1 tablet (10 mg total) by mouth at bedtime.   No facility-administered encounter medications on file as of 03/10/2023.    Allergies  Allergen Reactions   Crestor  [Rosuvastatin ] Other (See Comments)    Severe muscle pain   Asa [Aspirin ] Rash    Can take low-dose aspirin .   Bempedoic Acid  Other (See Comments)     Bilateral shoulder pain.   Codeine Rash   Penicillins Rash    Has patient had a PCN reaction causing immediate rash, facial/tongue/throat swelling, SOB or lightheadedness with hypotension:Yes Has patient had a PCN reaction causing severe rash involving mucus membranes or skin necrosis:Yes Has patient had a PCN reaction that required hospitalization:No Has patient had a PCN reaction occurring within the last 10 years:No If all of the above answers are NO, then may proceed with Cephalosporin use.     Review of Systems As per HPI  Objective:  BP 106/63   Pulse 98   Temp (!) 97.4 F (36.3 C)   Ht 5' 1 (1.549 m)   Wt 141 lb (64 kg)   SpO2 98%   BMI 26.64 kg/m    Wt Readings from Last 3 Encounters:  03/10/23 141 lb (64 kg)  12/19/22 134 lb (60.8 kg)  11/17/22 133 lb 6.4 oz (60.5 kg)   Physical Exam Constitutional:      General: She is awake. She is not in acute distress.    Appearance: Normal appearance. She is well-developed, well-groomed and overweight. She is ill-appearing. She is not toxic-appearing or diaphoretic.     Comments: Chronically ill-appearing   Cardiovascular:     Rate and Rhythm: Normal rate and regular rhythm.     Pulses: Normal pulses.          Radial pulses are 2+ on the right side and 2+ on the left side.       Posterior tibial pulses are 2+ on the right side and 2+ on the left side.     Heart sounds: Normal heart sounds. No murmur heard.    No gallop.  Pulmonary:     Effort: Pulmonary effort is normal. No respiratory distress.     Breath sounds: Normal breath sounds. No stridor. No wheezing, rhonchi or rales.  Musculoskeletal:     Cervical back: Full passive range of motion without pain and neck supple.     Right lower leg: No edema.     Left lower leg: No edema.  Skin:    General: Skin is warm.     Capillary Refill:  Capillary refill takes less than 2 seconds.     Findings: Abscess, bruising and ecchymosis present.          Comments: Small  puncture wound ~3mm in diameter with surrounding area of ecchymosis and bruising draining serous and purulent fluid  Neurological:     General: No focal deficit present.     Mental Status: She is alert, oriented to person, place, and time and easily aroused. Mental status is at baseline.     GCS: GCS eye subscore is 4. GCS verbal subscore is 5. GCS motor subscore is 6.     Motor: Weakness present.     Coordination: Coordination abnormal.     Gait: Gait abnormal.     Comments: Speech improve  Continues to walk with cane on Left side   Psychiatric:        Attention and Perception: Attention and perception normal.        Mood and Affect: Mood and affect normal.        Speech: Speech normal.        Behavior: Behavior normal. Behavior is cooperative.        Thought Content: Thought content normal. Thought content does not include homicidal or suicidal ideation. Thought content does not include homicidal or suicidal plan.        Cognition and Memory: Cognition and memory normal.        Judgment: Judgment normal.    Results for orders placed or performed in visit on 02/16/23  CUP PACEART REMOTE DEVICE CHECK   Collection Time: 02/15/23 11:15 PM  Result Value Ref Range   Date Time Interrogation Session [phone]    Pulse Generator Manufacturer MERM    Pulse Gen Model LNQ22 LINQ II    Pulse Gen Serial Number [id number]    Clinic Name [facility]    Implantable Pulse Generator Type ICM/ILR    Implantable Pulse Generator Implant Date [id number]        03/10/2023    3:52 PM 12/19/2022    1:10 PM 09/18/2022    8:06 AM 08/06/2022   10:37 AM 06/04/2022    9:17 AM  Depression screen PHQ 2/9  Decreased Interest 0 0 0 3 2  Down, Depressed, Hopeless 0 0 0 3 1  PHQ - 2 Score 0 0 0 6 3  Altered sleeping  1 2 2 3   Tired, decreased energy  1 2 3  0  Change in appetite  0 0 2 0  Feeling bad or failure about yourself   0 0 3 0  Trouble concentrating  0 0 2 0  Moving slowly or fidgety/restless  0  0 0 0  Suicidal thoughts  0 0 0 0  PHQ-9 Score  2 4 18 6   Difficult doing work/chores   Not difficult at all Very difficult Somewhat difficult       03/10/2023    3:52 PM 12/19/2022    1:10 PM 09/18/2022    8:06 AM 08/06/2022   10:38 AM  GAD 7 : Generalized Anxiety Score  Nervous, Anxious, on Edge 0 0 0 3  Control/stop worrying 0 0 0 2  Worry too much - different things 0 0 0 1  Trouble relaxing 0 1 2 3   Restless 0 0 0 2  Easily annoyed or irritable 0 1 2 3   Afraid - awful might happen 0 0 0 0  Total GAD 7 Score 0 2 4 14   Anxiety Difficulty Not difficult at all Somewhat difficult  Not difficult at all Very difficult   Pertinent labs & imaging results that were available during my care of the patient were reviewed by me and considered in my medical decision making.  Assessment & Plan:  Mekenzie was seen today for medical management of chronic issues.  Diagnoses and all orders for this visit:  1. Hypertension associated with diabetes (HCC) (Primary) Well controlled. Continue current regimen.   2. Type 2 diabetes mellitus with other specified complication, without long-term current use of insulin  (HCC) Well controlled. Will continue current regimen. Provided patient with new device in office. Will provide refills on lancets and test strips as below. Discussed with patient that I do not believe that her home device is accurate as she reports BP measurements from 20-73 without symptoms. In addition, A1C of 6.9% does not correlate with consistently hypoglycemic.  - Glucose Blood (BLOOD GLUCOSE TEST STRIPS) STRP; 1 each by In Vitro route 3 (three) times daily with meals. May substitute to any manufacturer covered by patient's insurance.  Dispense: 100 strip; Refill: 0 - Lancet Device MISC; Apply to device as directed  Dispense: 100 each; Refill: 12  3. Major depressive disorder, recurrent episode, moderate (HCC) Well controlled. Continue current regimen. Denies SI. Safety contract  established.   4. Anxiety, generalized As above.   5. Hyperlipidemia associated with type 2 diabetes mellitus (HCC) Labs as below. Will communicate results to patient once available. Will await results to determine next steps.  Not fasting  - Lipid panel  6. Former smoker Referral placed  - Ambulatory Referral Lung Cancer Screening [facility] [facility]  7. History of CVA (cerebrovascular accident) Reviewed notes from neurology on 11/18/2022 with [facility]. Patient to follow up with them next week. She is currently not taking praluent . Will coordinate with pharmacist  8. Vitamin D  deficiency Labs as below. Will communicate results to patient once available. Will await results to determine next steps. Not currently taking supplementation  - VITAMIN D  25 Hydroxy (Vit-D Deficiency, Fractures)  9. Type 2 diabetes mellitus with albuminuria (HCC) As above.  - Bayer DCA Hb A1c Waived - CBC with Differential/Platelet - CMP14+EGFR - Microalbumin / creatinine urine ratio - Glucose Blood (BLOOD GLUCOSE TEST STRIPS) STRP; 1 each by In Vitro route 3 (three) times daily with meals. May substitute to any manufacturer covered by patient's insurance.  Dispense: 100 strip; Refill: 0 - Lancet Device MISC; Apply to device as directed  Dispense: 100 each; Refill: 12  10. Abscess Will start medication as below. Discussed at home treatment.  - doxycycline  (VIBRA -TABS) 100 MG tablet; Take 1 tablet (100 mg total) by mouth 2 (two) times daily for 7 days.  Dispense: 14 tablet; Refill: 0  Continue all other maintenance medications.  Follow up plan: Return in about 3 months (around 06/07/2023) for Chronic Condition Follow up.   Continue healthy lifestyle choices, including diet (rich in fruits, vegetables, and lean proteins, and low in salt and simple carbohydrates) and exercise (at least 30 minutes of moderate physical activity daily).  Written and verbal instructions provided   The above assessment and  management plan was discussed with the patient. The patient verbalized understanding of and has agreed to the management plan. Patient is aware to call the clinic if they develop any new symptoms or if symptoms persist or worsen. Patient is aware when to return to the clinic for a follow-up visit. Patient educated on when it is appropriate to go to the emergency department.   Marry Kins, DNP-FNP [facility] [address] [place], [place] [address] [phone]

## 2023-03-11 ENCOUNTER — Telehealth: Payer: Self-pay | Admitting: *Deleted

## 2023-03-11 LAB — CBC WITH DIFFERENTIAL/PLATELET
Basophils Absolute: 0.1 10*3/uL (ref 0.0–0.2)
Basos: 0 %
EOS (ABSOLUTE): 0.1 10*3/uL (ref 0.0–0.4)
Eos: 0 %
Hematocrit: 45.8 % (ref 34.0–46.6)
Hemoglobin: 15.9 g/dL (ref 11.1–15.9)
Immature Grans (Abs): 0 10*3/uL (ref 0.0–0.1)
Immature Granulocytes: 0 %
Lymphocytes Absolute: 4.6 10*3/uL — ABNORMAL HIGH (ref 0.7–3.1)
Lymphs: 29 %
MCH: 31.5 pg (ref 26.6–33.0)
MCHC: 34.7 g/dL (ref 31.5–35.7)
MCV: 91 fL (ref 79–97)
Monocytes Absolute: 0.7 10*3/uL (ref 0.1–0.9)
Monocytes: 4 %
Neutrophils Absolute: 10.5 10*3/uL — ABNORMAL HIGH (ref 1.4–7.0)
Neutrophils: 67 %
Platelets: 347 10*3/uL (ref 150–450)
RBC: 5.04 x10E6/uL (ref 3.77–5.28)
RDW: 12.2 % (ref 11.7–15.4)
WBC: 15.9 10*3/uL — ABNORMAL HIGH (ref 3.4–10.8)

## 2023-03-11 LAB — LIPID PANEL
Cholesterol, Total: 220 mg/dL — ABNORMAL HIGH (ref 100–199)
HDL: 58 mg/dL (ref >39)
LDL CALC COMMENT:: 3.8 ratio (ref 0.0–4.4)
LDL Chol Calc (NIH): 113 mg/dL — ABNORMAL HIGH (ref 0–99)
Triglycerides: 283 mg/dL — ABNORMAL HIGH (ref 0–149)
VLDL Cholesterol Cal: 49 mg/dL — ABNORMAL HIGH (ref 5–40)

## 2023-03-11 LAB — CMP14+EGFR
ALT: 14 IU/L (ref 0–32)
AST: 19 IU/L (ref 0–40)
Albumin: 4.6 g/dL (ref 3.8–4.9)
Alkaline Phosphatase: 122 IU/L — ABNORMAL HIGH (ref 44–121)
BUN/Creatinine Ratio: 15 (ref 9–23)
BUN: 21 mg/dL (ref 6–24)
CO2: 21 mmol/L (ref 20–29)
Calcium: 10.5 mg/dL — ABNORMAL HIGH (ref 8.7–10.2)
Chloride: 102 mmol/L (ref 96–106)
Creatinine, Ser: 1.38 mg/dL — ABNORMAL HIGH (ref 0.57–1.00)
Globulin, Total: 2.8 g/dL (ref 1.5–4.5)
Glucose: 108 mg/dL — ABNORMAL HIGH (ref 70–99)
Potassium: 4 mmol/L (ref 3.5–5.2)
Sodium: 143 mmol/L (ref 134–144)
Total Protein: 7.4 g/dL (ref 6.0–8.5)
eGFR: 44 mL/min/{1.73_m2} — ABNORMAL LOW (ref >59)

## 2023-03-11 LAB — VITAMIN D 25 HYDROXY (VIT D DEFICIENCY, FRACTURES): Vit D, 25-Hydroxy: 28.4 ng/mL — ABNORMAL LOW (ref 30.0–100.0)

## 2023-03-11 MED ORDER — ACCU-CHEK GUIDE TEST VI STRP: ORAL_STRIP | 3 refills | Status: AC

## 2023-03-11 MED ORDER — ACCU-CHEK GUIDE ME W/DEVICE KIT: PACK | 0 refills | Status: AC

## 2023-03-11 NOTE — Telephone Encounter (Signed)
 Not sure which brand ins will pay for, sending in a AccuChek guide me and will see if this one is covered. Pharmacy maybe able to tell once they process claim.   Copied from CRM [id number]. Topic: Clinical - Prescription Issue >> Mar 11, 2023 11:51 AM Elle L wrote: Reason for CRM: The patient's mother, Hannah Mcdaniel, states that her Glucose Blood (BLOOD GLUCOSE TEST STRIPS) STRP is not covered under her insurance and wants to see if something else can be called in. Her call back number is [phone].

## 2023-03-17 ENCOUNTER — Encounter: Payer: Self-pay | Admitting: Family Medicine

## 2023-03-17 ENCOUNTER — Telehealth (INDEPENDENT_AMBULATORY_CARE_PROVIDER_SITE_OTHER): Payer: Self-pay | Admitting: Physician Assistant

## 2023-03-17 DIAGNOSIS — I6930 Unspecified sequelae of cerebral infarction: Secondary | ICD-10-CM | POA: Diagnosis not present

## 2023-03-17 DIAGNOSIS — R413 Other amnesia: Secondary | ICD-10-CM | POA: Diagnosis not present

## 2023-03-17 MED ORDER — VITAMIN D (ERGOCALCIFEROL) 1.25 MG (50000 UNIT) PO CAPS: 50000.0000 [IU] | ORAL_CAPSULE | ORAL | 0 refills | Status: AC

## 2023-03-17 MED ORDER — DONEPEZIL HCL 10 MG PO TABS: 10.0000 mg | ORAL_TABLET | Freq: Every day | ORAL | 11 refills | Status: DC
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
## 2023-03-17 NOTE — Patient Instructions (Signed)
It was a pleasure to see you today at our office.   Recommendations:  Continue Plavix and ASA  Increased Donepezil 10 mg daily. Side effects discussed   Continue secondary stroke prevention, follow with cardiology   B12 supplements  Follow up Aug 13 at  11:30    For psychiatric meds, mood meds: Please have your primary care physician manage these medications.  If you have any severe symptoms of a stroke, or other severe issues such as confusion,severe chills or fever, etc call 911 or go to the ER as you may need to be evaluated further     For assessment of decision of mental capacity and competency:  Call Dr. Erick Blinks, geriatric psychiatrist at [phone]  Counseling regarding caregiver distress, including caregiver depression, anxiety and issues regarding community resources, adult day care programs, adult living facilities, or memory care questions:  please contact your  Primary Doctor's Social Worker   Whom to call: Memory  decline, memory medications: Call our office [phone]    [email or web address]   RECOMMENDATIONS FOR ALL PATIENTS WITH MEMORY PROBLEMS: 1. Continue to exercise (Recommend 30 minutes of walking everyday, or 3 hours every week) 2. Increase social interactions - continue going to [place] and enjoy social gatherings with friends and family 3. Eat healthy, avoid fried foods and eat more fruits and vegetables 4. Maintain adequate blood pressure, blood sugar, and blood cholesterol level. Reducing the risk of stroke and cardiovascular disease also helps promoting better memory. 5. Avoid stressful situations. Live a simple life and avoid aggravations. Organize your time and prepare for the next day in anticipation. 6. Sleep well, avoid any interruptions of sleep and avoid any distractions in the bedroom that may interfere with adequate sleep quality 7. Avoid sugar, avoid sweets as there is a strong link  between excessive sugar intake, diabetes, and cognitive impairment We discussed the Mediterranean diet, which has been shown to help patients reduce the risk of progressive memory disorders and reduces cardiovascular risk. This includes eating fish, eat fruits and green leafy vegetables, nuts like almonds and hazelnuts, walnuts, and also use olive oil. Avoid fast foods and fried foods as much as possible. Avoid sweets and sugar as sugar use has been linked to worsening of memory function.  There is always a concern of gradual progression of memory problems. If this is the case, then we may need to adjust level of care according to patient needs. Support, both to the patient and caregiver, should then be put into place.      You have been referred for a neuropsychological evaluation (i.e., evaluation of memory and thinking abilities). Please bring someone with you to this appointment if possible, as it is helpful for the doctor to hear from both you and another adult who knows you well. Please bring eyeglasses and hearing aids if you wear them.    The evaluation will take approximately 3 hours and has two parts:   The first part is a clinical interview with the neuropsychologist (Dr. Milbert Coulter or Dr. Roseanne Reno). During the interview, the neuropsychologist will speak with you and the individual you brought to the appointment.    The second part of the evaluation is testing with the doctor's technician Annabelle Harman or Selena Batten). During the testing, the technician will ask you to remember different types of material, solve problems, and answer some questionnaires. Your family member will not be present for this portion of the evaluation.   Please note: We must reserve several hours of the neuropsychologist's  time and the psychometrician's time for your evaluation appointment. As such, there is a No-Show fee of $100. If you are unable to attend any of your appointments, please contact our office as soon as possible to  reschedule.      DRIVING: Regarding driving, in patients with progressive memory problems, driving will be impaired. We advise to have someone else do the driving if trouble finding directions or if minor accidents are reported. Independent driving assessment is available to determine safety of driving.   If you are interested in the driving assessment, you can contact the following:  [employer] in [place] [phone]  [facility] [phone]  [facility] [phone]  [facility] [phone] or [phone]   FALL PRECAUTIONS: Be cautious when walking. Scan the area for obstacles that may increase the risk of trips and falls. When getting up in the mornings, sit up at the edge of the bed for a few minutes before getting out of bed. Consider elevating the bed at the head end to avoid drop of blood pressure when getting up. Walk always in a well-lit room (use night lights in the walls). Avoid area rugs or power cords from appliances in the middle of the walkways. Use a walker or a cane if necessary and consider physical therapy for balance exercise. Get your eyesight checked regularly.  FINANCIAL OVERSIGHT: Supervision, especially oversight when making financial decisions or transactions is also recommended.  HOME SAFETY: Consider the safety of the kitchen when operating appliances like stoves, microwave oven, and blender. Consider having supervision and share cooking responsibilities until no longer able to participate in those. Accidents with firearms and other hazards in the house should be identified and addressed as well.   ABILITY TO BE LEFT ALONE: If patient is unable to contact 911 operator, consider using LifeLine, or when the need is there, arrange for someone to stay with patients. Smoking is a fire hazard, consider supervision or cessation. Risk of wandering should be assessed by caregiver and if detected at any point,  supervision and safe proof recommendations should be instituted.  MEDICATION SUPERVISION: Inability to self-administer medication needs to be constantly addressed. Implement a mechanism to ensure safe administration of the medications.      Mediterranean Diet A Mediterranean diet refers to food and lifestyle choices that are based on the traditions of countries located on the [employer]. This way of eating has been shown to help prevent certain conditions and improve outcomes for people who have chronic diseases, like kidney disease and heart disease. What are tips for following this plan? Lifestyle  Cook and eat meals together with your family, when possible. Drink enough fluid to keep your urine clear or pale yellow. Be physically active every day. This includes: Aerobic exercise like running or swimming. Leisure activities like gardening, walking, or housework. Get 7-8 hours of sleep each night. If recommended by your health care provider, drink red wine in moderation. This means 1 glass a day for nonpregnant women and 2 glasses a day for men. A glass of wine equals 5 oz (150 mL). Reading food labels  Check the serving size of packaged foods. For foods such as rice and pasta, the serving size refers to the amount of cooked product, not dry. Check the total fat in packaged foods. Avoid foods that have saturated fat or trans fats. Check the ingredients list for added sugars, such as corn syrup. Shopping  At the grocery store, buy most of your food  from the areas near the walls of the store. This includes: Fresh fruits and vegetables (produce). Grains, beans, nuts, and seeds. Some of these may be available in unpackaged forms or large amounts (in bulk). Fresh seafood. Poultry and eggs. Low-fat dairy products. Buy whole ingredients instead of prepackaged foods. Buy fresh fruits and vegetables in-season from local farmers markets. Buy frozen fruits and vegetables in resealable  bags. If you do not have access to quality fresh seafood, buy precooked frozen shrimp or canned fish, such as tuna, salmon, or sardines. Buy small amounts of raw or cooked vegetables, salads, or olives from the deli or salad bar at your store. Stock your pantry so you always have certain foods on hand, such as olive oil, canned tuna, canned tomatoes, rice, pasta, and beans. Cooking  Cook foods with extra-virgin olive oil instead of using butter or other vegetable oils. Have meat as a side dish, and have vegetables or grains as your main dish. This means having meat in small portions or adding small amounts of meat to foods like pasta or stew. Use beans or vegetables instead of meat in common dishes like chili or lasagna. Experiment with different cooking methods. Try roasting or broiling vegetables instead of steaming or sauteing them. Add frozen vegetables to soups, stews, pasta, or rice. Add nuts or seeds for added healthy fat at each meal. You can add these to yogurt, salads, or vegetable dishes. Marinate fish or vegetables using olive oil, lemon juice, garlic, and fresh herbs. Meal planning  Plan to eat 1 vegetarian meal one day each week. Try to work up to 2 vegetarian meals, if possible. Eat seafood 2 or more times a week. Have healthy snacks readily available, such as: Vegetable sticks with hummus. Greek yogurt. Fruit and nut trail mix. Eat balanced meals throughout the week. This includes: Fruit: 2-3 servings a day Vegetables: 4-5 servings a day Low-fat dairy: 2 servings a day Fish, poultry, or lean meat: 1 serving a day Beans and legumes: 2 or more servings a week Nuts and seeds: 1-2 servings a day Whole grains: 6-8 servings a day Extra-virgin olive oil: 3-4 servings a day Limit red meat and sweets to only a few servings a month What are my food choices? Mediterranean diet Recommended Grains: Whole-grain pasta. Brown rice. Bulgar wheat. Polenta. Couscous. Whole-wheat bread.  Orpah Cobb. Vegetables: Artichokes. Beets. Broccoli. Cabbage. Carrots. Eggplant. Green beans. Chard. Kale. Spinach. Onions. Leeks. Peas. Squash. Tomatoes. Peppers. Radishes. Fruits: Apples. Apricots. Avocado. Berries. Bananas. Cherries. Dates. Figs. Grapes. Lemons. Melon. Oranges. Peaches. Plums. Pomegranate. Meats and other protein foods: Beans. Almonds. Sunflower seeds. Pine nuts. Peanuts. Cod. Salmon. Scallops. Shrimp. Tuna. Tilapia. Clams. Oysters. Eggs. Dairy: Low-fat milk. Cheese. Greek yogurt. Beverages: Water. Red wine. Herbal tea. Fats and oils: Extra virgin olive oil. Avocado oil. Grape seed oil. Sweets and desserts: [place] yogurt with honey. Baked apples. Poached pears. Trail mix. Seasoning and other foods: Basil. Cilantro. Coriander. Cumin. Mint. Parsley. Sage. Rosemary. Tarragon. Garlic. Oregano. Thyme. Pepper. Balsalmic vinegar. Tahini. Hummus. Tomato sauce. Olives. Mushrooms. Limit these Grains: Prepackaged pasta or rice dishes. Prepackaged cereal with added sugar. Vegetables: Deep fried potatoes (french fries). Fruits: Fruit canned in syrup. Meats and other protein foods: Beef. Pork. Lamb. Poultry with skin. Hot dogs. Tomasa Blase. Dairy: Ice cream. Sour cream. Whole milk. Beverages: Juice. Sugar-sweetened soft drinks. Beer. Liquor and spirits. Fats and oils: Butter. Canola oil. Vegetable oil. Beef fat (tallow). Lard. Sweets and desserts: Cookies. Cakes. Pies. Candy. Seasoning and other foods: Mayonnaise. Premade  sauces and marinades. The items listed may not be a complete list. Talk with your dietitian about what dietary choices are right for you. Summary The Mediterranean diet includes both food and lifestyle choices. Eat a variety of fresh fruits and vegetables, beans, nuts, seeds, and whole grains. Limit the amount of red meat and sweets that you eat. Talk with your health care provider about whether it is safe for you to drink red wine in moderation. This means 1 glass a day  for nonpregnant women and 2 glasses a day for men. A glass of wine equals 5 oz (150 mL). This information is not intended to replace advice given to you by your health care provider. Make sure you discuss any questions you have with your health care provider. Document Released: 09/13/2015 Document Revised: 10/16/2015 Document Reviewed: 09/13/2015 Elsevier Interactive Patient Education  2017 [employer].

## 2023-03-17 NOTE — Addendum Note (Signed)
Addended by: Neale Burly on: 03/17/2023 09:18 AM   Modules accepted: Orders

## 2023-03-19 ENCOUNTER — Telehealth: Payer: Self-pay | Admitting: Pharmacist

## 2023-03-19 DIAGNOSIS — E1169 Type 2 diabetes mellitus with other specified complication: Secondary | ICD-10-CM

## 2023-03-19 NOTE — Telephone Encounter (Signed)
   Patient needs lipid management per PCP.  Will look into initiating PCSK9 therapy. Referral to Pharmacy placed.  Lipid Panel     Component Value Date/Time   CHOL 220 (H) 03/10/2023 1534   TRIG 283 (H) 03/10/2023 1534   HDL 58 03/10/2023 1534   CHOLHDL 3.8 03/10/2023 1534   CHOLHDL 2.7 11/01/2022 0657   VLDL 22 11/01/2022 0657   LDLCALC 113 (H) 03/10/2023 1534   LABVLDL 49 (H) 03/10/2023 1534   Cholesterol has improved. PCP recommends patient continue zetia and follow up with cardiology.  Remains vitamin d insufficient. I Stable CMP, recommend pt continue to follow up with nephrology. A1C remains controlled.   Kieth Brightly, PharmD, BCACP, CPP Clinical Pharmacist, [facility]

## 2023-03-20 ENCOUNTER — Telehealth: Payer: Self-pay

## 2023-03-20 NOTE — Progress Notes (Signed)
Care Guide Pharmacy Note  03/20/2023 Name: Hannah Mcdaniel MRN: [id number] DOB: 08/03/1965  Referred By: Arrie Senate, FNP Reason for referral: Care Coordination (Outreach to schedule with Pharm d /)   Hannah Mcdaniel is a 58 y.o. year old female who is a primary care patient of Arrie Senate, FNP.  Hannah Mcdaniel was referred to the pharmacist for assistance related to: DMII  Successful contact was made with the patient to discuss pharmacy services including being ready for the pharmacist to call at least 5 minutes before the scheduled appointment time and to have medication bottles and any blood pressure readings ready for review. The patient agreed to meet with the pharmacist via telephone visit on (date/time).04/17/2023  Hannah Mcdaniel , RMA     [place]  [facility], [facility] Guide  Direct Dial: [phone]  Website: [email or web address]

## 2023-03-23 ENCOUNTER — Ambulatory Visit (INDEPENDENT_AMBULATORY_CARE_PROVIDER_SITE_OTHER): Payer: 59

## 2023-03-23 DIAGNOSIS — I639 Cerebral infarction, unspecified: Secondary | ICD-10-CM | POA: Diagnosis not present

## 2023-03-24 LAB — CUP PACEART REMOTE DEVICE CHECK
Date Time Interrogation Session: 20250216231002
Implantable Pulse Generator Implant Date: 20230508

## 2023-03-30 NOTE — Progress Notes (Signed)
 Carelink Summary Report / Loop Recorder

## 2023-04-10 ENCOUNTER — Telehealth: Payer: Self-pay | Admitting: Family Medicine

## 2023-04-10 NOTE — Telephone Encounter (Signed)
 Spoke to [employer], patients mother, per signed dpr. Informed that the PA for neurology made mention of finances in her note.   I advised that she call neurology andtell them patient would like to start the process of having chart redaction.

## 2023-04-10 NOTE — Telephone Encounter (Signed)
 Copied from CRM [id number]. Topic: General - Other >> Apr 10, 2023  1:11 PM Payton Doughty wrote: Reason for CRM: pt's mother Leeza Heiner says someone has it where pt is not capable of handling her own finances.  She would like to know which dr that was.  Mom would like a call back at [phone]

## 2023-04-11 ENCOUNTER — Other Ambulatory Visit: Payer: Self-pay | Admitting: Family Medicine

## 2023-04-11 DIAGNOSIS — I10 Essential (primary) hypertension: Secondary | ICD-10-CM

## 2023-04-11 DIAGNOSIS — E1159 Type 2 diabetes mellitus with other circulatory complications: Secondary | ICD-10-CM

## 2023-04-17 ENCOUNTER — Other Ambulatory Visit (INDEPENDENT_AMBULATORY_CARE_PROVIDER_SITE_OTHER): Payer: Commercial Managed Care - HMO | Admitting: Pharmacist

## 2023-04-17 ENCOUNTER — Telehealth: Payer: Self-pay | Admitting: Pharmacist

## 2023-04-17 ENCOUNTER — Encounter: Payer: Self-pay | Admitting: Pharmacist

## 2023-04-17 DIAGNOSIS — I639 Cerebral infarction, unspecified: Secondary | ICD-10-CM

## 2023-04-17 DIAGNOSIS — E785 Hyperlipidemia, unspecified: Secondary | ICD-10-CM

## 2023-04-17 DIAGNOSIS — E1169 Type 2 diabetes mellitus with other specified complication: Secondary | ICD-10-CM

## 2023-04-17 DIAGNOSIS — I6930 Unspecified sequelae of cerebral infarction: Secondary | ICD-10-CM

## 2023-04-17 MED ORDER — REPATHA SURECLICK 140 MG/ML ~~LOC~~ SOAJ: 140.0000 mg | SUBCUTANEOUS | 2 refills | Status: DC

## 2023-04-17 NOTE — Progress Notes (Signed)
 04/17/2023 Name: Hannah Mcdaniel MRN: [id number] DOB: 1965/02/18  Chief Complaint  Patient presents with   Hyperlipidemia    Hannah Mcdaniel is a 58 y.o. year old female who presented for a telephone visit.  I connected with  Hannah Mcdaniel daughter (DPR) on 04/17/23 by telephone and verified that I am speaking with the correct person using two identifiers.  They were referred to the pharmacist by their PCP for assistance in managing hyperlipidemia.    Subjective:  Care Team: Primary Care Provider: Arrie Senate, FNP    Medication Access/Adherence  Current Pharmacy:  [phone] - [place], [place] - [address] [place] [address] Phone: [phone] Fax: [phone]   Patient reports affordability concerns with their medications: No  Patient reports access/transportation concerns to their pharmacy: No  Patient reports adherence concerns with their medications:  No  -- daughter manages medications   Hyperlipidemia/ASCVD Risk Reduction  Current lipid lowering medications: zetia Medications tried in the past: nexletol, rosuvastatin  Antiplatelet regimen: asa,plavix  ASCVD History: multiple strokes  Current physical activity: limited  Current medication access support: cigna  Clinical ASCVD: Yes  The ASCVD Risk score (Arnett DK, et al., 2019) failed to calculate for the following reasons:   Risk score cannot be calculated because patient has a medical history suggesting prior/existing ASCVD     Objective:  Lab Results  Component Value Date   HGBA1C 6.9 (H) 03/10/2023    Lab Results  Component Value Date   CREATININE 1.38 (H) 03/10/2023   BUN 21 03/10/2023   NA 143 03/10/2023   K 4.0 03/10/2023   CL 102 03/10/2023   CO2 21 03/10/2023    Lab Results  Component Value Date   CHOL 220 (H) 03/10/2023   HDL 58 03/10/2023   LDLCALC 113 (H) 03/10/2023   TRIG 283 (H) 03/10/2023   CHOLHDL 3.8  03/10/2023    Medications Reviewed Today     Reviewed by Danella Maiers, [facility] (Pharmacist) on 04/17/23 at 1304  Med List Status: <None>   Medication Order Taking? Sig Documenting Provider Last Dose Status Informant  acyclovir (ZOVIRAX) 400 MG tablet [id number]  Take 1 tablet (400 mg total) by mouth 3 (three) times daily. [place] Lighter, Dois Davenport, NP  Active   Alirocumab (PRALUENT) 150 MG/ML Ivory Broad [id number] No Inject 1 mL (150 mg total) into the skin every 14 (fourteen) days.  Patient not taking: Reported on 04/17/2023   Arrie Senate, FNP Not Taking Active Self  aspirin EC 81 MG tablet [id number] Yes Take 1 tablet (81 mg total) by mouth daily. Swallow whole. Meredeth Ide, MD Taking Active   Blood Glucose Monitoring Suppl (ACCU-CHEK GUIDE ME) w/Device KIT [id number]  Test BS TID Dx E11.69 Arrie Senate, FNP  Active   donepezil (ARICEPT) 10 MG tablet [id number] Yes Take 1 tablet (10 mg total) by mouth daily. Marcos Eke, PA-C Taking Active   empagliflozin (JARDIANCE) 10 MG TABS tablet [id number] Yes TAKE 1 TABLET BY MOUTH EVERY DAY Milian, Aleen Campi, FNP Taking Active   ezetimibe (ZETIA) 10 MG tablet [id number] Yes Take 1 tablet (10 mg total) by mouth daily. Meredeth Ide, MD Taking Active   glucose blood (ACCU-CHEK GUIDE TEST) test strip [id number]  Test BS TID Dx E11.69 Arrie Senate, FNP  Active   Lancet Device MISC [id number]  Apply to device as directed Milian, Aleen Campi, FNP  Active   losartan (COZAAR) 50 MG  tablet [id number] Yes TAKE 2 TABLETS BY MOUTH EVERY DAY Milian, Aleen Campi, FNP Taking Active   montelukast (SINGULAIR) 10 MG tablet [id number] Yes Take 1 tablet (10 mg total) by mouth at bedtime. Gwenlyn Fudge, FNP Taking Active Self  Vitamin D, Ergocalciferol, (DRISDOL) 1.25 MG (50000 UNIT) CAPS capsule [id number] Yes Take 1 capsule (50,000 Units total) by mouth every 7 (seven) days for 12 doses. Arrie Senate, FNP Taking Active                Assessment/Plan:  Restarted ezetimibe  Needs PCSK9 has insurance issues in the past Start repatha 140mg  q14 days-PA completed/approved F/u neuro and cardiology   Follow Up Plan: f/u PCP   Kieth Brightly, PharmD, BCACP, CPP Clinical Pharmacist, [facility]

## 2023-04-17 NOTE — Telephone Encounter (Signed)
  Lipid Panel     Component Value Date/Time   CHOL 220 (H) 03/10/2023 1534   TRIG 283 (H) 03/10/2023 1534   HDL 58 03/10/2023 1534   CHOLHDL 3.8 03/10/2023 1534   CHOLHDL 2.7 11/01/2022 0657   VLDL 22 11/01/2022 0657   LDLCALC 113 (H) 03/10/2023 1534   LABVLDL 49 (H) 03/10/2023 1534

## 2023-04-27 ENCOUNTER — Encounter: Payer: Self-pay | Admitting: Internal Medicine

## 2023-04-27 ENCOUNTER — Ambulatory Visit (INDEPENDENT_AMBULATORY_CARE_PROVIDER_SITE_OTHER): Payer: 59

## 2023-04-27 DIAGNOSIS — I639 Cerebral infarction, unspecified: Secondary | ICD-10-CM

## 2023-04-27 LAB — CUP PACEART REMOTE DEVICE CHECK
Date Time Interrogation Session: 20250323231144
Implantable Pulse Generator Implant Date: 20230508

## 2023-04-28 NOTE — Progress Notes (Signed)
 Carelink Summary Report / Loop Recorder

## 2023-04-28 NOTE — Addendum Note (Signed)
 Addended by: Geralyn Flash D on: 04/28/2023 04:42 PM   Modules accepted: Orders

## 2023-05-05 ENCOUNTER — Other Ambulatory Visit: Payer: Self-pay | Admitting: Family Medicine

## 2023-05-05 DIAGNOSIS — E1169 Type 2 diabetes mellitus with other specified complication: Secondary | ICD-10-CM

## 2023-05-05 DIAGNOSIS — R809 Proteinuria, unspecified: Secondary | ICD-10-CM

## 2023-05-23 ENCOUNTER — Other Ambulatory Visit: Payer: Self-pay | Admitting: Family Medicine

## 2023-05-26 NOTE — Telephone Encounter (Signed)
 Last OV 03/10/23. Last RF 03/17/23. Next OV 06/09/23

## 2023-06-01 ENCOUNTER — Ambulatory Visit (INDEPENDENT_AMBULATORY_CARE_PROVIDER_SITE_OTHER): Payer: 59

## 2023-06-01 DIAGNOSIS — I639 Cerebral infarction, unspecified: Secondary | ICD-10-CM | POA: Diagnosis not present

## 2023-06-01 LAB — CUP PACEART REMOTE DEVICE CHECK
Date Time Interrogation Session: 20250427231255
Implantable Pulse Generator Implant Date: 20230508

## 2023-06-02 ENCOUNTER — Encounter: Payer: Self-pay | Admitting: Internal Medicine

## 2023-06-09 ENCOUNTER — Telehealth: Payer: Self-pay

## 2023-06-09 ENCOUNTER — Ambulatory Visit: Payer: Commercial Managed Care - HMO | Admitting: Family Medicine

## 2023-06-09 NOTE — Telephone Encounter (Signed)
 Copied from CRM [id number]. Topic: Clinical - Medical Advice >> Jun 09, 2023 10:04 AM Tiffany H wrote: Reason for CRM: 1. Cardiology follow up necessary before neurology will forward. Monthly reading for loop recorder active.  2. Opthomologist - worsening vision. Friday, patient told her husband that she couldn't see him and didn't want to be left alone. Patient was able to see and navigate independently from February when she last saw neurologist virtually to last Friday 06/05/23. Neurologist wants to spread out time between visits. Daughter would like to explore reasons for vision loss before consenting/permanency of vision loss before consenting to move to longer times between visits. Please submit referral to a vision specialist. Patient has diabetes but it is well managed with Jardiance . Kidney functions are normal.  3. Time for refills. Patient struggles with persistent Vitamin D  Deficiency. Labs must be done to write next week's prescription for Vitamin D .  5. Endocrinology referral needed due to new vision issues - PCP has been checking renal function but patient has not seen kidney specialist. Has maternal history of kidney failure.   Patient has a number of questions and follow up concerns that cannot wait until 06/25/23 when Connell Degree comes back. Patient's daughter is open to seeing a new provider this time as long as she can have the above concerns addressed. She's scheduled to see Adell Hones tomorrow.

## 2023-06-10 ENCOUNTER — Encounter: Payer: Self-pay | Admitting: Nurse Practitioner

## 2023-06-10 ENCOUNTER — Ambulatory Visit: Admitting: Nurse Practitioner

## 2023-06-10 VITALS — BP 142/82 | HR 63 | Temp 98.2°F | Ht 61.0 in | Wt 152.0 lb

## 2023-06-10 DIAGNOSIS — H539 Unspecified visual disturbance: Secondary | ICD-10-CM | POA: Diagnosis not present

## 2023-06-10 DIAGNOSIS — J302 Other seasonal allergic rhinitis: Secondary | ICD-10-CM | POA: Diagnosis not present

## 2023-06-10 DIAGNOSIS — E1159 Type 2 diabetes mellitus with other circulatory complications: Secondary | ICD-10-CM

## 2023-06-10 DIAGNOSIS — E1169 Type 2 diabetes mellitus with other specified complication: Secondary | ICD-10-CM

## 2023-06-10 DIAGNOSIS — E559 Vitamin D deficiency, unspecified: Secondary | ICD-10-CM | POA: Diagnosis not present

## 2023-06-10 DIAGNOSIS — I152 Hypertension secondary to endocrine disorders: Secondary | ICD-10-CM

## 2023-06-10 DIAGNOSIS — E785 Hyperlipidemia, unspecified: Secondary | ICD-10-CM

## 2023-06-10 LAB — BAYER DCA HB A1C WAIVED: HB A1C (BAYER DCA - WAIVED): 6.4 % — ABNORMAL HIGH (ref 4.8–5.6)

## 2023-06-10 MED ORDER — MONTELUKAST SODIUM 10 MG PO TABS
10.0000 mg | ORAL_TABLET | Freq: Every day | ORAL | 0 refills | Status: DC
Start: 2023-06-10 — End: 2023-08-31

## 2023-06-10 MED ORDER — EZETIMIBE 10 MG PO TABS: 10.0000 mg | ORAL_TABLET | Freq: Every day | ORAL | 0 refills | Status: DC

## 2023-06-10 MED ORDER — HYDROCHLOROTHIAZIDE 25 MG PO TABS: 25.0000 mg | ORAL_TABLET | Freq: Every day | ORAL | 0 refills | Status: DC

## 2023-06-10 NOTE — Progress Notes (Unsigned)
 Established Patient Office Visit  Subjective  Patient ID: Hannah Mcdaniel, female    DOB: April 22, 1965  Age: 58 y.o. MRN: [id number]  Chief Complaint  Patient presents with   Referral    Referral to ophthalmologist, difficult with seeing     HPI Hannah Mcdaniel is a 58 year old female who presents with her daughter on May 11, 2023, with concerns about changes in vision. The patient has a history of cerebrovascular accident (CVA), most recently in September 2024, and reports ongoing difficulty with her vision, stating, "I can't see very well." According to her daughter, the patient has experienced right-sided peripheral vision loss since the last stroke. She was previously referred by Rosalynn Come, FNP, on December 19, 2022, to [facility] and was evaluated there. The daughter now requests a referral to optometry, as the patient is also having increasing difficulty with near vision.  Vision Screening   Right eye Left eye Both eyes  Without correction 20/40 20/40   With correction 20/30 20/40      Cryptogenic Stroke/pacemaker She has been receiving monthly remotely for pacemaker interrogation, last reading April 27th 2025, Neurology wants her to bve seen [id number]. Advise them to call cardiologist for appointent   HTN Hannah Mcdaniel is a 58 year old female with a known history of hypertension who presents for follow-up. Today's blood pressure is 142/82 mmHg. The patient reports that she has been off hydrochlorothiazide  (HCTZ) for the past 6 weeks due to running out of medication and has not yet refilled it. She denies headaches, dizziness, chest pain, or shortness of breath. She is otherwise feeling well but understands the importance of resuming her medication to better manage her blood pressure.  VIT-D deficiency: She has a diagnosis of vitamin D  deficiency and was weekly dose of vitamin D  supplement.  Daughter is requesting to have vitamin D  rechecked regarding the need of  continued vitamin D  supplement . Hannah Mcdaniel Patient Active Problem List   Diagnosis Date Noted   Vitamin D  deficiency 12/19/2022   Breast lump on left side at 11 o'clock position 11/17/2022   Herpes genitalis in women 11/17/2022   Acute CVA (cerebrovascular accident) (HCC) 10/31/2022   Memory impairment 10/28/2022   History of CVA (cerebrovascular accident) 08/01/2021   Difficulty sleeping 08/01/2021   Decreased appetite 08/01/2021   Cryptogenic stroke (HCC) 05/10/2021   Hypertension associated with diabetes (HCC) 04/11/2021   Seasonal allergies 04/11/2020   Smoker 05/27/2019   Hyperlipidemia associated with type 2 diabetes mellitus (HCC)    Migraine without aura and with status migrainosus, not intractable 04/29/2016   Type 2 diabetes mellitus with other specified complication (HCC) 01/24/2016   Anxiety, generalized 07/24/2015   Major depressive disorder, recurrent episode, moderate (HCC) 05/30/2013   PAD (peripheral artery disease) (HCC) 02/09/2013   Past Medical History:  Diagnosis Date   Anxiety    Arterial occlusive disease Nov. 2014   Arthritis    Claudication of lower extremity (HCC) Nov. 2014   Right Lower Extremity rest pain   Colon polyps    Depression    Diabetes mellitus without complication (HCC)    GERD (gastroesophageal reflux disease)    Hyperlipidemia    Migraines    Vertigo    Past Surgical History:  Procedure Laterality Date   ABDOMINAL AORTAGRAM N/A 01/10/2013   Procedure: ABDOMINAL AORTAGRAM;  Surgeon: Mayo Speck, MD;  Location: [facility] CATH LAB;  Service: Cardiovascular;  Laterality: N/A;   ABDOMINAL AORTOGRAM W/LOWER EXTREMITY Bilateral 07/16/2018   Procedure: ABDOMINAL  AORTOGRAM W/LOWER EXTREMITY;  Surgeon: Dannis Dy, MD;  Location: [facility] INVASIVE CV LAB;  Service: Cardiovascular;  Laterality: Bilateral;   ABDOMINAL AORTOGRAM W/LOWER EXTREMITY N/A 10/05/2020   Procedure: ABDOMINAL AORTOGRAM W/LOWER EXTREMITY;  Surgeon: Dannis Dy, MD;   Location: [facility] INVASIVE CV LAB;  Service: Cardiovascular;  Laterality: N/A;   BUBBLE STUDY  03/26/2021   Procedure: BUBBLE STUDY;  Surgeon: Wendie Hamburg, MD;  Location: [facility] ENDOSCOPY;  Service: Cardiovascular;;   CHOLECYSTECTOMY     Gall Bladder   iliac artery angioplasty and stent placement  01/10/13   LOWER EXTREMITY ANGIOGRAM Bilateral 01/10/2013   Procedure: LOWER EXTREMITY ANGIOGRAM;  Surgeon: Mayo Speck, MD;  Location: [facility] CATH LAB;  Service: Cardiovascular;  Laterality: Bilateral;   PERCUTANEOUS STENT INTERVENTION Right 01/10/2013   Procedure: PERCUTANEOUS STENT INTERVENTION;  Surgeon: Mayo Speck, MD;  Location: [facility] CATH LAB;  Service: Cardiovascular;  Laterality: Right;  rt common iliac stent   PERIPHERAL VASCULAR CATHETERIZATION N/A 11/26/2015   Procedure: Abdominal Aortogram w/Lower Extremity;  Surgeon: Dannis Dy, MD;  Location: [facility] INVASIVE CV LAB;  Service: Cardiovascular;  Laterality: N/A;   PERIPHERAL VASCULAR CATHETERIZATION Right 11/26/2015   Procedure: Peripheral Vascular Balloon Angioplasty;  Surgeon: Dannis Dy, MD;  Location: [facility] INVASIVE CV LAB;  Service: Cardiovascular;  Laterality: Right;  rt common iliac   PERIPHERAL VASCULAR INTERVENTION  07/16/2018   Procedure: PERIPHERAL VASCULAR INTERVENTION;  Surgeon: Dannis Dy, MD;  Location: [facility] INVASIVE CV LAB;  Service: Cardiovascular;;   PERIPHERAL VASCULAR INTERVENTION Right 10/05/2020   Procedure: PERIPHERAL VASCULAR INTERVENTION;  Surgeon: Dannis Dy, MD;  Location: [facility] INVASIVE CV LAB;  Service: Cardiovascular;  Laterality: Right;  Common and exteral iliac artery   TEE WITHOUT CARDIOVERSION N/A 03/26/2021   Procedure: TRANSESOPHAGEAL ECHOCARDIOGRAM (TEE);  Surgeon: Wendie Hamburg, MD;  Location: [facility] ENDOSCOPY;  Service: Cardiovascular;  Laterality: N/A;   TOTAL ABDOMINAL HYSTERECTOMY     Social History   Tobacco Use   Smoking status: Former    Current packs/day:  0.00    Average packs/day: 0.3 packs/day for 43.8 years (11.0 ttl pk-yrs)    Types: Cigarettes    Start date: 05/16/1977    Quit date: 03/21/2021    Years since quitting: 2.2   Smokeless tobacco: Never   Tobacco comments:    1/2 cigarette per day  Vaping Use   Vaping status: Never Used  Substance Use Topics   Alcohol use: Yes    Alcohol/week: 1.0 standard drink of alcohol    Types: 1 Shots of liquor per week    Comment: occ   Drug use: No   Social History   Socioeconomic History   Marital status: Married    Spouse name: Not on file   Number of children: 3   Years of education: Not on file   Highest education level: 10th grade  Occupational History   Not on file  Tobacco Use   Smoking status: Former    Current packs/day: 0.00    Average packs/day: 0.3 packs/day for 43.8 years (11.0 ttl pk-yrs)    Types: Cigarettes    Start date: 05/16/1977    Quit date: 03/21/2021    Years since quitting: 2.2   Smokeless tobacco: Never   Tobacco comments:    1/2 cigarette per day  Vaping Use   Vaping status: Never Used  Substance and Sexual Activity   Alcohol use: Yes    Alcohol/week: 1.0 standard drink of alcohol  Types: 1 Shots of liquor per week    Comment: occ   Drug use: No   Sexual activity: Yes    Partners: Male    Birth control/protection: Surgical  Other Topics Concern   Not on file  Social History Narrative   Right handed   Lives with husband   Not working   Drinks caffeine prn   One floor home   Social Drivers of Health   Financial Resource Strain: Low Risk  (05/03/2022)   Overall Financial Resource Strain (CARDIA)    Difficulty of Paying Living Expenses: Not hard at all  Food Insecurity: No Food Insecurity (10/31/2022)   Hunger Vital Sign    Worried About Running Out of Food in the Last Year: Never true    Ran Out of Food in the Last Year: Never true  Transportation Needs: No Transportation Needs (10/31/2022)   PRAPARE - Scientist, research (physical sciences) (Medical): No    Lack of Transportation (Non-Medical): No  Physical Activity: Insufficiently Active (05/03/2022)   Exercise Vital Sign    Days of Exercise per Week: 1 day    Minutes of Exercise per Session: 20 min  Stress: No Stress Concern Present (05/03/2022)   [employer] of Occupational Health - Occupational Stress Questionnaire    Feeling of Stress : Not at all  Social Connections: Moderately Isolated (05/03/2022)   Social Connection and Isolation Panel [NHANES]    Frequency of Communication with Friends and Family: Three times a week    Frequency of Social Gatherings with Friends and Family: Once a week    Attends Religious Services: Never    Database administrator or Organizations: No    Attends Engineer, structural: Not on file    Marital Status: Married  Catering manager Violence: Not At Risk (10/31/2022)   Humiliation, Afraid, Rape, and Kick questionnaire    Fear of Current or Ex-Partner: No    Emotionally Abused: No    Physically Abused: No    Sexually Abused: No   Family Status  Relation Name Status   Mother  Alive   Father  Deceased   Brother  Alive   MGM  Deceased   MGF  Deceased   PGM  Deceased   PGF  Deceased   Brother  Alive   Brother  Alive   Brother  Alive   Daughter  Alive   Daughter  Alive   Son  Alive  No partnership data on file   Family History  Problem Relation Age of Onset   Diabetes Mother    Hyperlipidemia Mother    Hypertension Mother    Varicose Veins Mother    COPD Mother    Stroke Mother    Anxiety disorder Mother    Depression Mother    Ovarian cancer Mother    Acute myelogenous leukemia Father    Diabetes Brother    Hypertension Brother    Hyperlipidemia Brother    Stroke Maternal Grandmother    Anxiety disorder Maternal Grandmother    Depression Maternal Grandmother    Breast cancer Maternal Grandmother    Diabetes Maternal Grandmother    Heart disease Maternal Grandfather    Lung cancer  Paternal Grandfather    Other Brother        Blood disease   Anxiety disorder Daughter    Migraines Daughter    Migraines Daughter    Migraines Son    Seizures Son    Allergies  Allergen  Reactions   Crestor  [Rosuvastatin ] Other (See Comments)    Severe muscle pain   Sallyann Crea ] Rash    Can take low-dose aspirin .   Bempedoic Acid  Other (See Comments)    Bilateral shoulder pain.   Codeine Rash   Penicillins Rash    Has patient had a PCN reaction causing immediate rash, facial/tongue/throat swelling, SOB or lightheadedness with hypotension:Yes Has patient had a PCN reaction causing severe rash involving mucus membranes or skin necrosis:Yes Has patient had a PCN reaction that required hospitalization:No Has patient had a PCN reaction occurring within the last 10 years:No If all of the above answers are "NO", then may proceed with Cephalosporin use.       Review of Systems  Constitutional:  Negative for chills and fever.  HENT:  Negative for sore throat and tinnitus.   Eyes:  Positive for blurred vision. Negative for double vision, photophobia and pain.       Up close  Respiratory:  Negative for cough and shortness of breath.   Cardiovascular:  Negative for chest pain and leg swelling.  Gastrointestinal:  Negative for blood in stool, diarrhea, nausea and vomiting.  Skin:  Negative for itching and rash.  Neurological:  Negative for dizziness and headaches.   Negative unless indicated in HPI   Objective:     BP (!) 142/82   Pulse 63   Temp 98.2 F (36.8 C) (Temporal)   Ht 5\' 1"  (1.549 m)   Wt 152 lb (68.9 kg)   SpO2 99%   BMI 28.72 kg/m  BP Readings from Last 3 Encounters:  06/10/23 (!) 142/82  03/10/23 106/63  12/19/22 113/67   Wt Readings from Last 3 Encounters:  06/10/23 152 lb (68.9 kg)  03/10/23 141 lb (64 kg)  12/19/22 134 lb (60.8 kg)      Physical Exam Vitals and nursing note reviewed.  Constitutional:      General: She is not in acute  distress. HENT:     Head: Normocephalic and atraumatic.     Nose: Nose normal.  Eyes:     General: Lids are normal. No scleral icterus.    Extraocular Movements: Extraocular movements intact.     Conjunctiva/sclera: Conjunctivae normal.     Right eye: No exudate or hemorrhage.    Left eye: No exudate or hemorrhage.    Pupils: Pupils are equal, round, and reactive to light.  Cardiovascular:     Heart sounds: Normal heart sounds.  Pulmonary:     Effort: Pulmonary effort is normal.     Breath sounds: Normal breath sounds.  Musculoskeletal:        General: Normal range of motion.     Right lower leg: No edema.     Left lower leg: No edema.  Skin:    General: Skin is warm and dry.  Neurological:     Mental Status: She is alert and oriented to person, place, and time.  Psychiatric:        Mood and Affect: Mood normal.        Behavior: Behavior normal.        Thought Content: Thought content normal.        Judgment: Judgment normal.     Results for orders placed or performed in visit on 06/10/23  VITAMIN D  25 Hydroxy (Vit-D Deficiency, Fractures)  Result Value Ref Range   Vit D, 25-Hydroxy 41.3 30.0 - 100.0 ng/mL  CMP14+EGFR  Result Value Ref Range   Glucose 90 70 - 99  mg/dL   BUN 11 6 - 24 mg/dL   Creatinine, Ser 5.78 0.57 - 1.00 mg/dL   eGFR 69 >46 NG/EXB/2.84   BUN/Creatinine Ratio 11 9 - 23   Sodium 142 134 - 144 mmol/L   Potassium 4.4 3.5 - 5.2 mmol/L   Chloride 104 96 - 106 mmol/L   CO2 19 (L) 20 - 29 mmol/L   Calcium  9.8 8.7 - 10.2 mg/dL   Total Protein 7.0 6.0 - 8.5 g/dL   Albumin 4.8 3.8 - 4.9 g/dL   Globulin, Total 2.2 1.5 - 4.5 g/dL   Bilirubin Total 0.3 0.0 - 1.2 mg/dL   Alkaline Phosphatase 144 (H) 44 - 121 IU/L   AST 20 0 - 40 IU/L   ALT 19 0 - 32 IU/L  Bayer DCA Hb A1c Waived  Result Value Ref Range   HB A1C (BAYER DCA - WAIVED) 6.4 (H) 4.8 - 5.6 %    Last CBC Lab Results  Component Value Date   WBC 15.9 (H) 03/10/2023   HGB 15.9 03/10/2023    HCT 45.8 03/10/2023   MCV 91 03/10/2023   MCH 31.5 03/10/2023   RDW 12.2 03/10/2023   PLT 347 03/10/2023   Last metabolic panel Lab Results  Component Value Date   GLUCOSE 90 06/10/2023   NA 142 06/10/2023   K 4.4 06/10/2023   CL 104 06/10/2023   CO2 19 (L) 06/10/2023   BUN 11 06/10/2023   CREATININE 0.96 06/10/2023   EGFR 69 06/10/2023   CALCIUM  9.8 06/10/2023   PROT 7.0 06/10/2023   ALBUMIN 4.8 06/10/2023   LABGLOB 2.2 06/10/2023   AGRATIO 2.0 05/07/2022   BILITOT 0.3 06/10/2023   ALKPHOS 144 (H) 06/10/2023   AST 20 06/10/2023   ALT 19 06/10/2023   ANIONGAP 10 10/31/2022   Last lipids Lab Results  Component Value Date   CHOL 220 (H) 03/10/2023   HDL 58 03/10/2023   LDLCALC 113 (H) 03/10/2023   TRIG 283 (H) 03/10/2023   CHOLHDL 3.8 03/10/2023   Last hemoglobin A1c Lab Results  Component Value Date   HGBA1C 6.4 (H) 06/10/2023   Last thyroid  functions Lab Results  Component Value Date   TSH 1.150 10/20/2022   T4TOTAL 11.5 02/20/2022        Assessment & Plan:  Vision abnormalities  Seasonal allergies -     Montelukast  Sodium; Take 1 tablet (10 mg total) by mouth at bedtime.  Dispense: 90 tablet; Refill: 0  Type 2 diabetes mellitus with other specified complication, without long-term current use of insulin  (HCC) -     Microalbumin / creatinine urine ratio -     Bayer DCA Hb A1c Waived  Hyperlipidemia associated with type 2 diabetes mellitus (HCC) -     Ezetimibe ; Take 1 tablet (10 mg total) by mouth daily.  Dispense: 90 tablet; Refill: 0  Vitamin D  deficiency -     VITAMIN D  25 Hydroxy (Vit-D Deficiency, Fractures)  Hypertension associated with diabetes (HCC) -     hydroCHLOROthiazide ; Take 1 tablet (25 mg total) by mouth daily.  Dispense: 90 tablet; Refill: 0 -     CMP14+EGFR   Hannah Mcdaniel is a 58 yrs old caucasian female seen today for chronic disease management, no acute distress HTN: Continue hydrochlorothiazide  25 mg daily, refill  provided Vitamin D  deficiency: Vitamin D  level ordered result pending Patient abnormality: Call refused optometry clinic to make an appointment if referral is needed to contact the clinic Hyperlipidemia: Continue Zetia  10 mg  daily refill provided Allergic rhinitis: Continue singular 10 mg at bedtime refill provided Diabetes: A1c of 6.8 continue Jardiance  10 mg daily, micro albumin ordered Labs: Microalbumin, CMP, A1c Encouragehealthy lifestyle choices, including diet (rich in fruits, vegetables, and lean proteins, and low in salt and simple carbohydrates) and exercise (at least 30 minutes of moderate physical activity daily).     The above assessment and management plan was discussed with the patient. The patient verbalized understanding of and has agreed to the management plan. Patient is aware to call the clinic if they develop any new symptoms or if symptoms persist or worsen. Patient is aware when to return to the clinic for a follow-up visit. Patient educated on when it is appropriate to go to the emergency department.  Return in about 3 months (around 09/10/2023) for Chronic Diseases Management.    Hannah Mcdaniel [place] Thompson, DNP [facility] [address] [place], [place] [address] [phone]    Note: This document was prepared by [employer] voice dictation technology and any errors that results from this process are unintentional.

## 2023-06-11 LAB — CMP14+EGFR
ALT: 19 IU/L (ref 0–32)
AST: 20 IU/L (ref 0–40)
Albumin: 4.8 g/dL (ref 3.8–4.9)
Alkaline Phosphatase: 144 IU/L — ABNORMAL HIGH (ref 44–121)
BUN/Creatinine Ratio: 11 (ref 9–23)
BUN: 11 mg/dL (ref 6–24)
Bilirubin Total: 0.3 mg/dL (ref 0.0–1.2)
CO2: 19 mmol/L — ABNORMAL LOW (ref 20–29)
Calcium: 9.8 mg/dL (ref 8.7–10.2)
Chloride: 104 mmol/L (ref 96–106)
Creatinine, Ser: 0.96 mg/dL (ref 0.57–1.00)
Globulin, Total: 2.2 g/dL (ref 1.5–4.5)
Glucose: 90 mg/dL (ref 70–99)
Potassium: 4.4 mmol/L (ref 3.5–5.2)
Sodium: 142 mmol/L (ref 134–144)
Total Protein: 7 g/dL (ref 6.0–8.5)
eGFR: 69 mL/min/{1.73_m2} (ref >59)

## 2023-06-11 LAB — VITAMIN D 25 HYDROXY (VIT D DEFICIENCY, FRACTURES): Vit D, 25-Hydroxy: 41.3 ng/mL (ref 30.0–100.0)

## 2023-06-12 NOTE — Progress Notes (Signed)
 Carelink Summary Report / Loop Recorder

## 2023-07-05 ENCOUNTER — Other Ambulatory Visit: Payer: Self-pay | Admitting: Family Medicine

## 2023-07-05 DIAGNOSIS — I10 Essential (primary) hypertension: Secondary | ICD-10-CM

## 2023-07-05 DIAGNOSIS — E1159 Type 2 diabetes mellitus with other circulatory complications: Secondary | ICD-10-CM

## 2023-07-06 ENCOUNTER — Ambulatory Visit (INDEPENDENT_AMBULATORY_CARE_PROVIDER_SITE_OTHER)

## 2023-07-06 ENCOUNTER — Ambulatory Visit (INDEPENDENT_AMBULATORY_CARE_PROVIDER_SITE_OTHER): Payer: Self-pay

## 2023-07-06 DIAGNOSIS — I639 Cerebral infarction, unspecified: Secondary | ICD-10-CM | POA: Diagnosis not present

## 2023-07-06 DIAGNOSIS — E1169 Type 2 diabetes mellitus with other specified complication: Secondary | ICD-10-CM | POA: Diagnosis not present

## 2023-07-06 LAB — CUP PACEART REMOTE DEVICE CHECK
Date Time Interrogation Session: 20250601231926
Implantable Pulse Generator Implant Date: 20230508

## 2023-07-06 LAB — HM DIABETES EYE EXAM

## 2023-07-06 NOTE — Progress Notes (Signed)
 Hannah Mcdaniel arrived 07/06/2023 and has given verbal consent to obtain images and complete their overdue diabetic retinal screening.  The images have been sent to an ophthalmologist or optometrist for review and interpretation.  Results will be sent back to Chrystine Crate, FNP for review.  Patient has been informed they will be contacted when we receive the results via telephone or MyChart

## 2023-07-09 ENCOUNTER — Ambulatory Visit: Payer: Self-pay | Admitting: Internal Medicine

## 2023-07-17 NOTE — Progress Notes (Signed)
 Carelink Summary Report / Loop Recorder

## 2023-07-25 ENCOUNTER — Other Ambulatory Visit: Payer: Self-pay | Admitting: Family Medicine

## 2023-07-25 DIAGNOSIS — E1129 Type 2 diabetes mellitus with other diabetic kidney complication: Secondary | ICD-10-CM

## 2023-07-25 DIAGNOSIS — E1169 Type 2 diabetes mellitus with other specified complication: Secondary | ICD-10-CM

## 2023-07-28 ENCOUNTER — Other Ambulatory Visit: Payer: Self-pay | Admitting: Family Medicine

## 2023-07-28 DIAGNOSIS — I10 Essential (primary) hypertension: Secondary | ICD-10-CM

## 2023-07-28 DIAGNOSIS — I152 Hypertension secondary to endocrine disorders: Secondary | ICD-10-CM

## 2023-08-06 ENCOUNTER — Ambulatory Visit (INDEPENDENT_AMBULATORY_CARE_PROVIDER_SITE_OTHER): Payer: Self-pay

## 2023-08-06 DIAGNOSIS — I639 Cerebral infarction, unspecified: Secondary | ICD-10-CM

## 2023-08-06 LAB — CUP PACEART REMOTE DEVICE CHECK
Date Time Interrogation Session: 20250702232203
Implantable Pulse Generator Implant Date: 20230508

## 2023-08-12 ENCOUNTER — Ambulatory Visit: Payer: Self-pay | Admitting: Internal Medicine

## 2023-08-12 ENCOUNTER — Ambulatory Visit: Payer: Self-pay | Admitting: Family Medicine

## 2023-08-24 NOTE — Progress Notes (Signed)
 Carelink Summary Report / Loop Recorder

## 2023-08-27 ENCOUNTER — Ambulatory Visit: Payer: Self-pay

## 2023-08-27 NOTE — Telephone Encounter (Signed)
 Appt made

## 2023-08-27 NOTE — Telephone Encounter (Signed)
 FYI Only or Action Required?: FYI only for provider.  Patient was last seen in primary care on 06/10/2023 by Hannah Morton Sebastian Nena, NP.  Called Nurse Triage reporting Anxiety.  Symptoms began several weeks ago.  Interventions attempted: Nothing.  Symptoms are: gradually worsening.  Triage Disposition: See PCP When Office is Open (Within 3 Days)  Patient/caregiver understands and will follow disposition?: Yes Reason for Disposition  [1] Anxiety symptoms AND [2] has not been evaluated for this by doctor (or NP/PA)  Answer Assessment - Initial Assessment Questions 1. CONCERN: Did anything happen that prompted you to call today?      Concerned  about patient being anxious and overwhelmed  2. ANXIETY SYMPTOMS: Can you describe how you (your loved one; patient) have been feeling? (e.g., tense, restless, panicky, anxious, keyed up, overwhelmed, sense of impending doom).      Overwhelmed  3. ONSET: How long have you been feeling this way? (e.g., hours, days, weeks)     Started in [place] 2024., gradually getting worse this past month  4. SEVERITY: How would you rate the level of anxiety? (e.g., 0 - 10; or mild, moderate, severe).     Mild to moderate  5. FUNCTIONAL IMPAIRMENT: How have these feelings affected your ability to do daily activities? Have you had more difficulty than usual doing your normal daily activities? (e.g., getting better, same, worse; self-care, school, work, interactions)     *No Answer* 6. HISTORY: Have you felt this way before? Have you ever been diagnosed with an anxiety problem in the past? (e.g., generalized anxiety disorder, panic attacks, PTSD). If Yes, ask: How was this problem treated? (e.g., medicines, counseling, etc.)     No  7. RISK OF HARM - SUICIDAL IDEATION: Do you ever have thoughts of hurting or killing yourself? If Yes, ask:  Do you have these feelings now? Do you have a plan on how you would do this?  Per daughter,  patient has not expressed thoughts of suicide      8. TREATMENT:  What has been done so far to treat this anxiety? (e.g., medicines, relaxation strategies). What has helped?     No treatment, family has been doing redirection  9. THERAPIST: Do you have a counselor or therapist? If Yes, ask: What is their name?     No  10. POTENTIAL TRIGGERS: Do you drink caffeinated beverages (e.g., coffee, colas, teas), and how much daily? Do you drink alcohol or use any drugs? Have you started any new medicines recently?       Drinks caffeine, but family has assisted with cutting back  11. PATIENT SUPPORT: Who is with you now? Who do you live with? Do you have family or friends who you can talk to?        Has family support, currently with mother  89. OTHER SYMPTOMS: Do you have any other symptoms? (e.g., feeling depressed, trouble concentrating, trouble sleeping, trouble breathing, palpitations or fast heartbeat, chest pain, sweating, nausea, or diarrhea) Night mares or night terrors        13. PREGNANCY: Is there any chance you are pregnant? When was your last menstrual period?       No  Protocols used: Anxiety and Panic Attack-A-AH

## 2023-08-27 NOTE — Telephone Encounter (Signed)
 Copied from CRM [id number]. Topic: Clinical - Medical Advice >> Aug 27, 2023  8:55 AM Jasmin G wrote: Reason for CRM: Pt mother called due to pt experiencing nicotine withdrawals accompanied by mood swings and cramping in hands. Please call mother back ASAP at [phone]

## 2023-08-28 ENCOUNTER — Encounter: Payer: Self-pay | Admitting: Family Medicine

## 2023-08-28 ENCOUNTER — Ambulatory Visit (INDEPENDENT_AMBULATORY_CARE_PROVIDER_SITE_OTHER): Admitting: Family Medicine

## 2023-08-28 VITALS — BP 91/56 | HR 96 | Temp 97.5°F | Ht 61.0 in | Wt 152.4 lb

## 2023-08-28 DIAGNOSIS — F411 Generalized anxiety disorder: Secondary | ICD-10-CM

## 2023-08-28 DIAGNOSIS — I6930 Unspecified sequelae of cerebral infarction: Secondary | ICD-10-CM | POA: Diagnosis not present

## 2023-08-28 DIAGNOSIS — F17200 Nicotine dependence, unspecified, uncomplicated: Secondary | ICD-10-CM

## 2023-08-28 DIAGNOSIS — G720 Drug-induced myopathy: Secondary | ICD-10-CM | POA: Diagnosis not present

## 2023-08-28 DIAGNOSIS — F339 Major depressive disorder, recurrent, unspecified: Secondary | ICD-10-CM

## 2023-08-28 DIAGNOSIS — E1159 Type 2 diabetes mellitus with other circulatory complications: Secondary | ICD-10-CM

## 2023-08-28 DIAGNOSIS — I152 Hypertension secondary to endocrine disorders: Secondary | ICD-10-CM

## 2023-08-28 DIAGNOSIS — T466X5A Adverse effect of antihyperlipidemic and antiarteriosclerotic drugs, initial encounter: Secondary | ICD-10-CM | POA: Insufficient documentation

## 2023-08-28 MED ORDER — BUPROPION HCL ER (XL) 150 MG PO TB24: ORAL_TABLET | ORAL | 0 refills | Status: DC

## 2023-08-28 NOTE — Progress Notes (Signed)
 Acute Office Visit  Subjective:     Patient ID: Hannah Mcdaniel, female    DOB: [birth year], 58 y.o.   MRN: [id number]  Chief Complaint  Patient presents with   Anxiety   Nicotine Dependence    Anxiety    Nicotine Dependence Her urge triggers include company of smokers.   Her with son today. Daughter Connell participated in visit visit via telephone. Patient is in today for low blood pressure, anxiety, and smoking cessation.   They had to call the ambulance last week due to dehydration. BP at home has been low 90/50s. She has felt dizzy, weak, shaky, fatigued. She had not been taking hydrochlorothiazide  since last October. When she was seen in May, hydrochlorothiazide  was sent in for her and she restarted this. She has been compliant with losartan  100 mg daily.   She does have swelling in her LLE since her stroke in September. She has been wearing a compression sock with improvement. Denies chest pain, shortness of breath. She uses a cane to assist with ambulation. Balance has been poor due to CVA.    She has been feeling overwhelmed and overstimulated. She just knows she doesn't feel well and this makes her feel anxiety. She has been having night terrors since her last stroke in September. Also feels down and depressed. Denies SI.   She has started back smoking. Her mother has bought her cigarettes. She had previously quit smoking and was using a nicotine free vape instead. She would like to quit smoking again. Hasn't tried medication to assist with cessation before but would like to try medication. She has taken Wellbutrin  in the past for depression and did well with this.        08/28/2023   11:44 AM 03/10/2023    3:52 PM 12/19/2022    1:10 PM  Depression screen PHQ 2/9  Decreased Interest 3 0 0  Down, Depressed, Hopeless 3 0 0  PHQ - 2 Score 6 0 0  Altered sleeping 0  1  Tired, decreased energy 0  1  Change in appetite 2  0  Feeling bad or failure about yourself  3  0   Trouble concentrating 3  0  Moving slowly or fidgety/restless 0  0  Suicidal thoughts 0  0  PHQ-9 Score 14  2  Difficult doing work/chores Somewhat difficult        08/28/2023   11:45 AM 03/10/2023    3:52 PM 12/19/2022    1:10 PM 09/18/2022    8:06 AM  GAD 7 : Generalized Anxiety Score  Nervous, Anxious, on Edge 2 0 0 0  Control/stop worrying 1 0 0 0  Worry too much - different things 0 0 0 0  Trouble relaxing 0 0 1 2  Restless 0 0 0 0  Easily annoyed or irritable 3 0 1 2  Afraid - awful might happen 0 0 0 0  Total GAD 7 Score 6 0 2 4  Anxiety Difficulty Somewhat difficult Not difficult at all Somewhat difficult Not difficult at all        08/28/2023   11:44 AM 03/10/2023    3:52 PM 12/19/2022    1:10 PM  Depression screen PHQ 2/9  Decreased Interest 3 0 0  Down, Depressed, Hopeless 3 0 0  PHQ - 2 Score 6 0 0  Altered sleeping 0  1  Tired, decreased energy 0  1  Change in appetite 2  0  Feeling bad or failure  about yourself  3  0  Trouble concentrating 3  0  Moving slowly or fidgety/restless 0  0  Suicidal thoughts 0  0  PHQ-9 Score 14  2  Difficult doing work/chores Somewhat difficult        08/28/2023   11:45 AM 03/10/2023    3:52 PM 12/19/2022    1:10 PM 09/18/2022    8:06 AM  GAD 7 : Generalized Anxiety Score  Nervous, Anxious, on Edge 2 0 0 0  Control/stop worrying 1 0 0 0  Worry too much - different things 0 0 0 0  Trouble relaxing 0 0 1 2  Restless 0 0 0 0  Easily annoyed or irritable 3 0 1 2  Afraid - awful might happen 0 0 0 0  Total GAD 7 Score 6 0 2 4  Anxiety Difficulty Somewhat difficult Not difficult at all Somewhat difficult Not difficult at all     ROS As per HPI.      Objective:    BP (!) 91/56   Pulse 96   Temp (!) 97.5 F (36.4 C) (Temporal)   Ht 5' 1 (1.549 m)   Wt 152 lb 6.4 oz (69.1 kg)   SpO2 95%   BMI 28.80 kg/m  BP Readings from Last 3 Encounters:  08/28/23 (!) 91/56  06/10/23 (!) 142/82  03/10/23 106/63       Physical Exam Vitals and nursing note reviewed.  Constitutional:      General: She is not in acute distress.    Appearance: She is not ill-appearing, toxic-appearing or diaphoretic.  Cardiovascular:     Rate and Rhythm: Normal rate and regular rhythm.     Heart sounds: Normal heart sounds. No murmur heard. Pulmonary:     Effort: Pulmonary effort is normal. No respiratory distress.     Breath sounds: Normal breath sounds. No wheezing.  Musculoskeletal:     Right lower leg: No edema.     Left lower leg: No edema.  Skin:    General: Skin is warm and dry.  Neurological:     Mental Status: She is alert and oriented to person, place, and time. Mental status is at baseline.  Psychiatric:        Mood and Affect: Mood normal.        Behavior: Behavior normal.        Cognition and Memory: Cognition is impaired. Memory is impaired.     No results found for any visits on 08/28/23.      Assessment & Plan:   Jaquisha was seen today for anxiety and nicotine dependence.  Diagnoses and all orders for this visit:  Hypertension associated with diabetes (HCC) BP has been low- symptomatic. D/c hydrochlorothiazide . Wear compression sock for swelling. Monitor BP at home and notify for high or low reading, increased swelling.   History of CVA with residual deficit Statin myopathy [G72.0, T46.6X5A] On aspirin , repatha .   Depression, recurrent (HCC) Generalized anxiety disorder Uncontrolled. Denies SI. Will try wellbutrin  as below as this may also help with smoking cessation. Notify for any side effects.  -     buPROPion  (WELLBUTRIN  XL) 150 MG 24 hr tablet; Take 1 tablet by mouth daily for 3 days. On day 4, increase to 1 tablet by mouth twice a day.  Tobacco use disorder 5 minutes of smoking cessation instruction/counseling given:  counseled patient on the dangers of tobacco use, advised patient to stop smoking, and reviewed strategies to maximize success -  buPROPion  (WELLBUTRIN  XL)  150 MG 24 hr tablet; Take 1 tablet by mouth daily for 3 days. On day 4, increase to 1 tablet by mouth twice a day.  Keep scheduled appt on 09/17/23 for follow up.   The patient indicates understanding of these issues and agrees with the plan.  Annabella CHRISTELLA Search, FNP

## 2023-08-31 ENCOUNTER — Other Ambulatory Visit: Payer: Self-pay | Admitting: Nurse Practitioner

## 2023-08-31 DIAGNOSIS — J302 Other seasonal allergic rhinitis: Secondary | ICD-10-CM

## 2023-08-31 DIAGNOSIS — I152 Hypertension secondary to endocrine disorders: Secondary | ICD-10-CM

## 2023-08-31 DIAGNOSIS — E1169 Type 2 diabetes mellitus with other specified complication: Secondary | ICD-10-CM

## 2023-09-07 ENCOUNTER — Ambulatory Visit: Payer: Self-pay

## 2023-09-07 DIAGNOSIS — I639 Cerebral infarction, unspecified: Secondary | ICD-10-CM

## 2023-09-07 LAB — CUP PACEART REMOTE DEVICE CHECK
Date Time Interrogation Session: 20250802231111
Implantable Pulse Generator Implant Date: 20230508

## 2023-09-08 ENCOUNTER — Ambulatory Visit: Payer: Self-pay | Admitting: Internal Medicine

## 2023-09-17 ENCOUNTER — Ambulatory Visit: Admitting: Family Medicine

## 2023-09-18 ENCOUNTER — Encounter: Payer: Self-pay | Admitting: Family Medicine

## 2023-10-01 ENCOUNTER — Ambulatory Visit: Admitting: Family Medicine

## 2023-10-01 ENCOUNTER — Encounter: Payer: Self-pay | Admitting: Family Medicine

## 2023-10-01 VITALS — BP 154/74 | HR 81 | Temp 98.0°F | Ht 61.0 in | Wt 153.2 lb

## 2023-10-01 DIAGNOSIS — E1159 Type 2 diabetes mellitus with other circulatory complications: Secondary | ICD-10-CM | POA: Diagnosis not present

## 2023-10-01 DIAGNOSIS — F339 Major depressive disorder, recurrent, unspecified: Secondary | ICD-10-CM

## 2023-10-01 DIAGNOSIS — I739 Peripheral vascular disease, unspecified: Secondary | ICD-10-CM

## 2023-10-01 DIAGNOSIS — E1169 Type 2 diabetes mellitus with other specified complication: Secondary | ICD-10-CM | POA: Diagnosis not present

## 2023-10-01 DIAGNOSIS — G720 Drug-induced myopathy: Secondary | ICD-10-CM

## 2023-10-01 DIAGNOSIS — I152 Hypertension secondary to endocrine disorders: Secondary | ICD-10-CM

## 2023-10-01 DIAGNOSIS — L602 Onychogryphosis: Secondary | ICD-10-CM

## 2023-10-01 DIAGNOSIS — Z23 Encounter for immunization: Secondary | ICD-10-CM | POA: Diagnosis not present

## 2023-10-01 DIAGNOSIS — F17200 Nicotine dependence, unspecified, uncomplicated: Secondary | ICD-10-CM

## 2023-10-01 DIAGNOSIS — E1165 Type 2 diabetes mellitus with hyperglycemia: Secondary | ICD-10-CM | POA: Diagnosis not present

## 2023-10-01 DIAGNOSIS — Z7984 Long term (current) use of oral hypoglycemic drugs: Secondary | ICD-10-CM

## 2023-10-01 DIAGNOSIS — F411 Generalized anxiety disorder: Secondary | ICD-10-CM

## 2023-10-01 DIAGNOSIS — L729 Follicular cyst of the skin and subcutaneous tissue, unspecified: Secondary | ICD-10-CM

## 2023-10-01 DIAGNOSIS — I6930 Unspecified sequelae of cerebral infarction: Secondary | ICD-10-CM

## 2023-10-01 LAB — BAYER DCA HB A1C WAIVED: HB A1C (BAYER DCA - WAIVED): 6.4 % — ABNORMAL HIGH (ref 4.8–5.6)

## 2023-10-01 MED ORDER — BUSPIRONE HCL 5 MG PO TABS: 5.0000 mg | ORAL_TABLET | Freq: Two times a day (BID) | ORAL | 3 refills | Status: DC

## 2023-10-01 NOTE — Progress Notes (Signed)
 Established Patient Office Visit  Subjective   Patient ID: Hannah Mcdaniel, female    DOB: January 07, 1966  Age: 58 y.o. MRN: [id number]  Chief Complaint  Patient presents with   Medical Management of Chronic Issues    HPI  Here with daughter today.   History of Present Illness   Hannah Mcdaniel is a 58 year old female with type 2 diabetes who presents for a regular follow-up visit.  Glycemic control and hypoglycemia - Type 2 diabetes managed with Jardiance  10 mg daily - Home blood glucose levels generally stable - Statin intolerant - On ARB - Most recent HbA1c is 6.4%  Blood pressure management  - Hypertension managed with losartan  100 mg daily - Home blood pressure readings average 130/70 mmHg - Previous episodes of dehydration and hypotension with a different antihypertensive medication have resolved  Lipid management and statin intolerance - Hyperlipidemia managed with Repatha  and Zetia  due to statin intolerance - She just restarted Repatha  less than 2 months ago - LDL prior to restarting Repatha  was not at goal  Peripheral vascular symptoms - Takes aspirin  and Plavix  for peripheral artery disease - Purple discoloration present in feet with dependent position - No significant swelling in feet or ankles except for the left foot affected by stroke - No numbness or tingling in feet except on the left side due to stroke  Cerebrovascular disease and neurological deficits - History of stroke resulting in dementia and left-sided physical limitations - Swelling present in left foot, tingling in left hand and foot - Takes Aricept  for cognitive symptoms - Physical therapy completed for rehabilitation - Loop recorder implanted in May 2023 with no detected arrhythmias  Tobacco cessation - Wellbutrin  has aided in smoking cessation - No cigarette use for several weeks; currently uses a nicotine-free vape   Depression and anxiety - Some improvement with Wellbutrin  - Anxiety  remains high, with restlessness and feelings of confinement at home  Cardiopulmonary symptoms - No chest pain - No shortness of breath         10/01/2023    3:03 PM 08/28/2023   11:44 AM 03/10/2023    3:52 PM  Depression screen PHQ 2/9  Decreased Interest 0 3 0  Down, Depressed, Hopeless 1 3 0  PHQ - 2 Score 1 6 0  Altered sleeping 0 0   Tired, decreased energy 0 0   Change in appetite 0 2   Feeling bad or failure about yourself  0 3   Trouble concentrating 0 3   Moving slowly or fidgety/restless 0 0   Suicidal thoughts 0 0   PHQ-9 Score 1 14   Difficult doing work/chores Not difficult at all Somewhat difficult       10/01/2023    3:03 PM 08/28/2023   11:45 AM 03/10/2023    3:52 PM 12/19/2022    1:10 PM  GAD 7 : Generalized Anxiety Score  Nervous, Anxious, on Edge 1 2 0 0  Control/stop worrying 0 1 0 0  Worry too much - different things 0 0 0 0  Trouble relaxing 1 0 0 1  Restless 0 0 0 0  Easily annoyed or irritable 1 3 0 1  Afraid - awful might happen 1 0 0 0  Total GAD 7 Score 4 6 0 2  Anxiety Difficulty Somewhat difficult Somewhat difficult Not difficult at all Somewhat difficult       ROS    Objective:     BP (!) 154/74  Pulse 81   Temp 98 F (36.7 C) (Temporal)   Ht 5' 1 (1.549 m)   Wt 153 lb 3.2 oz (69.5 kg)   SpO2 99%   BMI 28.95 kg/m    Physical Exam Vitals and nursing note reviewed.  Constitutional:      General: She is not in acute distress.    Appearance: She is not ill-appearing, toxic-appearing or diaphoretic.  Cardiovascular:     Rate and Rhythm: Normal rate and regular rhythm.     Heart sounds: Normal heart sounds. No murmur heard. Pulmonary:     Effort: Pulmonary effort is normal. No respiratory distress.     Breath sounds: Normal breath sounds. No wheezing, rhonchi or rales.  Musculoskeletal:     Right lower leg: No edema.     Left lower leg: No edema.  Skin:    General: Skin is warm and dry.     Comments: 1 cm subcutaneous  mobile cyst to right side of chest wall. No erythema or signs of infection.   Neurological:     Mental Status: She is alert and oriented to person, place, and time. Mental status is at baseline.  Psychiatric:        Mood and Affect: Mood normal.        Behavior: Behavior normal.      No results found for any visits on 10/01/23.    The ASCVD Risk score (Arnett DK, et al., 2019) failed to calculate for the following reasons:   Risk score cannot be calculated because patient has a medical history suggesting prior/existing ASCVD    Assessment & Plan:   Hannah Mcdaniel was seen today for medical management of chronic issues.  Diagnoses and all orders for this visit:  Type 2 diabetes mellitus with hyperglycemia, without long-term current use of insulin  (HCC) -     Bayer DCA Hb A1c Waived -     Microalbumin / creatinine urine ratio -     Ambulatory referral to Podiatry  Thickened nail -     Ambulatory referral to Podiatry  Hypertension associated with diabetes (HCC)  Hyperlipidemia associated with type 2 diabetes mellitus (HCC)  Statin myopathy [G72.0, T46.6X5A]  History of CVA with residual deficit  PAD (peripheral artery disease) (HCC)  Depression, recurrent (HCC)  Generalized anxiety disorder -     busPIRone  (BUSPAR ) 5 MG tablet; Take 1 tablet (5 mg total) by mouth 2 (two) times daily.  Tobacco use disorder  Subcutaneous cyst  Need for vaccination -     Varicella-zoster vaccine IM -     Tdap vaccine greater than or equal to 7yo IM      Type 2 diabetes mellitus A1c 6.4% today, at goal of <7. Statin intolerant. On ARB  - Continue Jardiance  10 mg daily. - Follow up in 3 months to recheck A1c. - Referral to podiatry placed for thick toenails that are difficult to cut  Hypertension Blood pressure elevated today, well-controlled at home (130/70 mmHg). - Continue losartan  100 mg daily. - Monitor blood pressure at home.  Hyperlipidemia Statin intolerant - Continue  Repatha . - Continue Zetia . - Recheck cholesterol at next appointment.  Peripheral artery disease Established with vascular.  - Continue aspirin  daily. - Continue Plavix .  Hx of CVA, dementia Left-sided hemiparesis and cognitive impairment present. On Aricept . Physical therapy completed - Continue Aricept . - Follow up with neurology  Generalized anxiety disorder and major depressive disorder Anxiety high, some improvement on Wellbutrin . Considering Buspar  for anxiety. - Start Buspar  5  mg twice daily. - Monitor for dizziness. - Reassess anxiety in a few weeks, adjust Buspar  dosage if needed.  Nicotine dependence, uncomplicated Significant improvement with Wellbutrin . No nicotine use, using non-nicotine vape. - Continue Wellbutrin . - Encourage continued abstinence from nicotine.  Benign subcutaneous cyst or lipoma Small, mobile cyst or lipoma. No immediate concern unless changes occur. - Monitor cyst for changes. - Consider referral if problematic.       Return in about 3 months (around 01/01/2024) for chronic follow up, check lipids.  Total time spent caring for the patient today was 44 minutes. This includes time spent before the visit reviewing the chart, time spent during the visit, and time spent after the visit on documentation.  The patient indicates understanding of these issues and agrees with the plan.    Annabella CHRISTELLA Search, FNP

## 2023-10-06 ENCOUNTER — Other Ambulatory Visit: Payer: Self-pay | Admitting: *Deleted

## 2023-10-06 DIAGNOSIS — I639 Cerebral infarction, unspecified: Secondary | ICD-10-CM

## 2023-10-06 DIAGNOSIS — E1169 Type 2 diabetes mellitus with other specified complication: Secondary | ICD-10-CM

## 2023-10-06 MED ORDER — REPATHA SURECLICK 140 MG/ML ~~LOC~~ SOAJ: 140.0000 mg | SUBCUTANEOUS | 1 refills | Status: AC

## 2023-10-08 ENCOUNTER — Ambulatory Visit (INDEPENDENT_AMBULATORY_CARE_PROVIDER_SITE_OTHER): Payer: Self-pay

## 2023-10-08 DIAGNOSIS — I639 Cerebral infarction, unspecified: Secondary | ICD-10-CM | POA: Diagnosis not present

## 2023-10-08 LAB — CUP PACEART REMOTE DEVICE CHECK
Date Time Interrogation Session: 20250903232509
Implantable Pulse Generator Implant Date: 20230508

## 2023-10-11 ENCOUNTER — Ambulatory Visit: Payer: Self-pay | Admitting: Internal Medicine

## 2023-10-17 NOTE — Progress Notes (Signed)
 Remote Loop Recorder Transmission

## 2023-10-23 ENCOUNTER — Other Ambulatory Visit: Payer: Self-pay | Admitting: *Deleted

## 2023-10-23 DIAGNOSIS — I10 Essential (primary) hypertension: Secondary | ICD-10-CM

## 2023-10-23 DIAGNOSIS — E1129 Type 2 diabetes mellitus with other diabetic kidney complication: Secondary | ICD-10-CM

## 2023-10-23 DIAGNOSIS — E1159 Type 2 diabetes mellitus with other circulatory complications: Secondary | ICD-10-CM

## 2023-10-23 DIAGNOSIS — E1169 Type 2 diabetes mellitus with other specified complication: Secondary | ICD-10-CM

## 2023-10-23 MED ORDER — LOSARTAN POTASSIUM 50 MG PO TABS: 100.0000 mg | ORAL_TABLET | Freq: Every day | ORAL | 0 refills | Status: AC

## 2023-10-23 MED ORDER — EMPAGLIFLOZIN 10 MG PO TABS: 10.0000 mg | ORAL_TABLET | Freq: Every day | ORAL | 0 refills | Status: DC

## 2023-10-25 IMAGING — CT CT ANGIO HEAD-NECK (W OR W/O PERF)
1 of 11 series · 5 of 35 positions shown · IV contrast (OMNI 350)
Comparison: CT and MRI April 11, 2021.  CTA March 21, 2021.

CLINICAL DATA: Neuro deficit, acute, stroke suspected

EXAM:
CT ANGIOGRAPHY HEAD AND NECK
TECHNIQUE: Multidetector CT imaging of the head and neck was performed using
the standard protocol during bolus administration of intravenous
contrast. Multiplanar CT image reconstructions and MIPs were
obtained to evaluate the vascular anatomy. Carotid stenosis
measurements (when applicable) are obtained utilizing NASCET
criteria, using the distal internal carotid diameter as the
denominator.

[Series 11: cta neck axial · axial · 0.39mm/px · z∈[-212,+3]mm · 5 of 324 slices shown]
[im 54/324  soft-tissue]
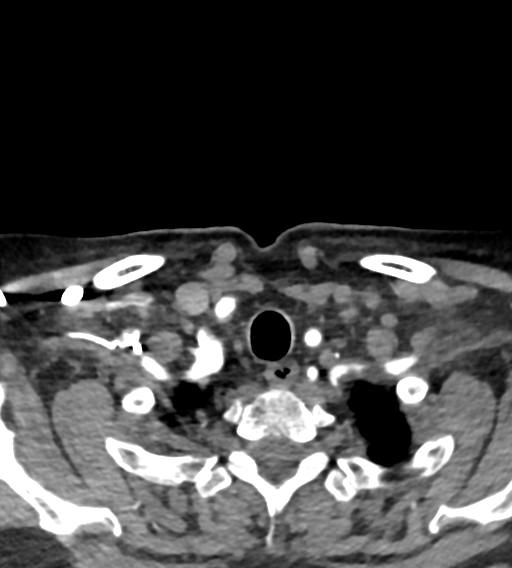
[im 108/324  bone]
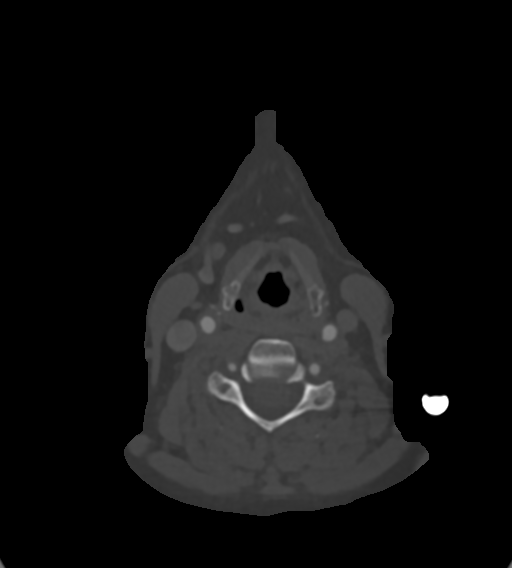
[im 162/324  soft-tissue]
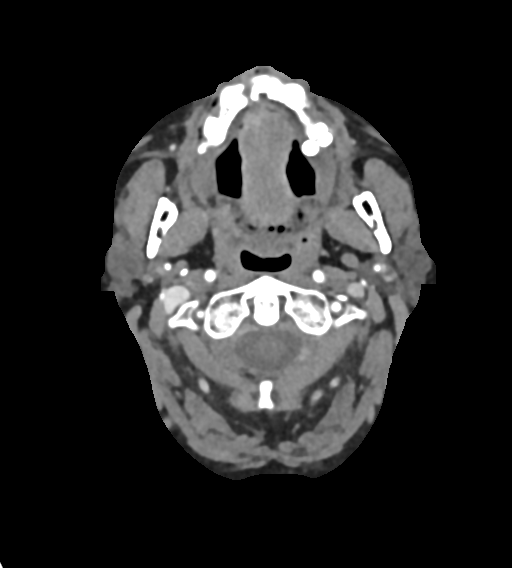
[im 216/324  bone]
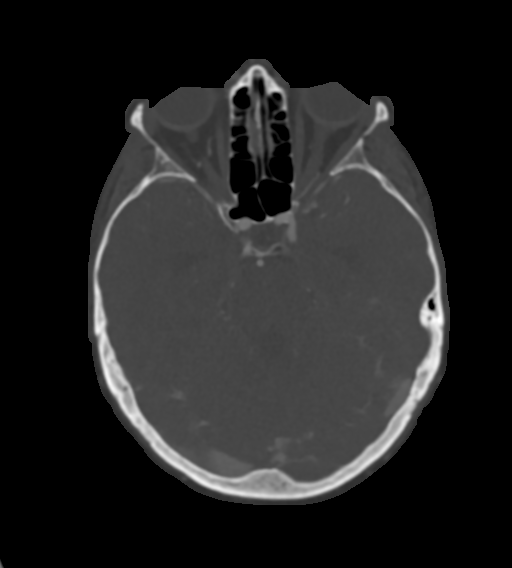
[im 270/324  soft-tissue]
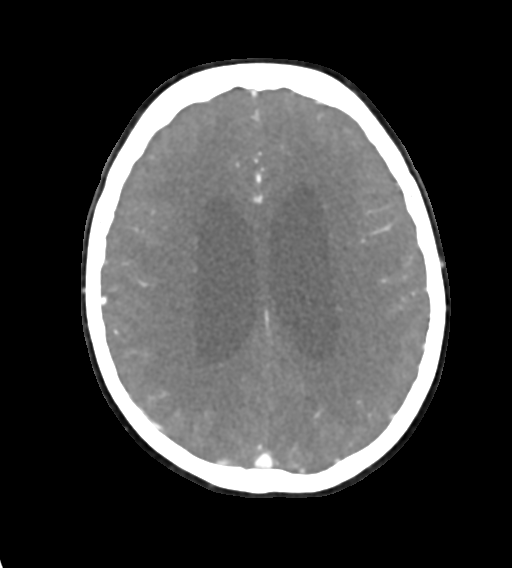

[5 of 35 positions shown; findings below may reference images not displayed]

RADIATION DOSE REDUCTION: This exam was performed according to the
departmental dose-optimization program which includes automated
exposure control, adjustment of the mA and/or kV according to
patient size and/or use of iterative reconstruction technique.

CONTRAST:  75mL OMNIPAQUE IOHEXOL 350 MG/ML SOLN
FINDINGS: CT HEAD FINDINGS

Brain: Evolving infarcts in the right posterior limb of the internal
capsule and right temporal horn. Acute infarct in the left temporal
stem better characterized on recent MRI. Remote infarct in the right
cerebellum. No evidence of acute hemorrhage, mass lesion, midline
shift, extra-axial fluid collection. Similar ventriculomegaly, which
could relate to normal pressure hydrocephalus in the correct
clinical setting.

Vascular: See below.

Skull: No acute fracture.

Sinuses: Clear visualized sinuses.

Orbits: No acute finding.

Review of the MIP images confirms the above findings

CTA NECK FINDINGS

Aortic arch: Great vessel origins are patent. Atherosclerosis of the
aorta and great vessel origins.

Right carotid system: Atherosclerosis at the carotid bifurcation
without greater than 50% stenosis.

Left carotid system: Atherosclerosis of the common carotid artery
and carotid bifurcation without greater than 50% stenosis.

Vertebral arteries: Left dominant. No evidence of significant
(greater than 50%) stenosis.

Skeleton: Subtle height loss and sclerosis of the T2 superior
endplate.

Other neck: No evidence of acute abnormality.

Upper chest: Visualized lung apices are clear.

Review of the MIP images confirms the above findings

CTA HEAD FINDINGS

Anterior circulation: Bilateral intracranial ICAs are patent with
mild calcific atherosclerosis. Bilateral MCAs and ACAs are patent
without proximal hemodynamically significant stenosis. Early right
M1 MCA bifurcation.

Posterior circulation: Bilateral intradural vertebral arteries,
basilar artery and posterior cerebral arteries are patent. Moderate
bilateral P2 PCA stenosis.

Venous sinuses: As permitted by contrast timing, patent.

Review of the MIP images confirms the above findings
IMPRESSION: 1. No large vessel occlusion.
2. Moderate bilateral P2 PCA stenosis.
3. Bilateral carotid bifurcation atherosclerosis without greater
than 50% stenosis.
4. Subtle height loss and sclerosis of the T2 superior endplate,
which may represent a recent superior endplate fracture and appears
new since March 21, 2021 study. An MRI of the thoracic spine
could better evaluate if clinically indicated.

## 2023-11-02 NOTE — Progress Notes (Signed)
 Remote Loop Recorder Transmission

## 2023-11-10 ENCOUNTER — Encounter (HOSPITAL_COMMUNITY): Payer: Self-pay | Admitting: Emergency Medicine

## 2023-11-10 ENCOUNTER — Emergency Department (HOSPITAL_COMMUNITY)

## 2023-11-10 ENCOUNTER — Emergency Department (HOSPITAL_COMMUNITY)
Admission: EM | Admit: 2023-11-10 | Discharge: 2023-11-10 | Disposition: A | Discharge status: Home / Self Care | Attending: Emergency Medicine | Admitting: Emergency Medicine

## 2023-11-10 ENCOUNTER — Ambulatory Visit: Payer: Self-pay

## 2023-11-10 ENCOUNTER — Other Ambulatory Visit: Payer: Self-pay | Admitting: Family Medicine

## 2023-11-10 ENCOUNTER — Other Ambulatory Visit: Payer: Self-pay

## 2023-11-10 DIAGNOSIS — Z7902 Long term (current) use of antithrombotics/antiplatelets: Secondary | ICD-10-CM | POA: Insufficient documentation

## 2023-11-10 DIAGNOSIS — I639 Cerebral infarction, unspecified: Secondary | ICD-10-CM

## 2023-11-10 DIAGNOSIS — Z7982 Long term (current) use of aspirin: Secondary | ICD-10-CM | POA: Insufficient documentation

## 2023-11-10 DIAGNOSIS — E119 Type 2 diabetes mellitus without complications: Secondary | ICD-10-CM | POA: Diagnosis not present

## 2023-11-10 DIAGNOSIS — Z87891 Personal history of nicotine dependence: Secondary | ICD-10-CM | POA: Diagnosis not present

## 2023-11-10 DIAGNOSIS — F17200 Nicotine dependence, unspecified, uncomplicated: Secondary | ICD-10-CM

## 2023-11-10 DIAGNOSIS — F339 Major depressive disorder, recurrent, unspecified: Secondary | ICD-10-CM

## 2023-11-10 DIAGNOSIS — R531 Weakness: Secondary | ICD-10-CM | POA: Diagnosis present

## 2023-11-10 DIAGNOSIS — G459 Transient cerebral ischemic attack, unspecified: Secondary | ICD-10-CM | POA: Diagnosis not present

## 2023-11-10 LAB — DIFFERENTIAL
Abs Immature Granulocytes: 0.02 K/uL (ref 0.00–0.07)
Basophils Absolute: 0.1 K/uL (ref 0.0–0.1)
Basophils Relative: 1 %
Eosinophils Absolute: 0.1 K/uL (ref 0.0–0.5)
Eosinophils Relative: 1 %
Immature Granulocytes: 0 %
Lymphocytes Relative: 32 %
Lymphs Abs: 2.7 K/uL (ref 0.7–4.0)
Monocytes Absolute: 0.4 K/uL (ref 0.1–1.0)
Monocytes Relative: 5 %
Neutro Abs: 5.1 K/uL (ref 1.7–7.7)
Neutrophils Relative %: 61 %

## 2023-11-10 LAB — RAPID URINE DRUG SCREEN, HOSP PERFORMED
Amphetamines: NOT DETECTED
Barbiturates: NOT DETECTED
Benzodiazepines: NOT DETECTED
Cocaine: NOT DETECTED
Opiates: NOT DETECTED
Tetrahydrocannabinol: NOT DETECTED

## 2023-11-10 LAB — ETHANOL: Alcohol, Ethyl (B): <15 mg/dL (ref <15)

## 2023-11-10 LAB — I-STAT CHEM 8, ED
BUN: 20 mg/dL (ref 6–20)
Calcium, Ion: 1.18 mmol/L (ref 1.15–1.40)
Chloride: 106 mmol/L (ref 98–111)
Creatinine, Ser: 1.4 mg/dL — ABNORMAL HIGH (ref 0.44–1.00)
Glucose, Bld: 110 mg/dL — ABNORMAL HIGH (ref 70–99)
HCT: 45 % (ref 36.0–46.0)
Hemoglobin: 15.3 g/dL — ABNORMAL HIGH (ref 12.0–15.0)
Potassium: 3.9 mmol/L (ref 3.5–5.1)
Sodium: 140 mmol/L (ref 135–145)
TCO2: 22 mmol/L (ref 22–32)

## 2023-11-10 LAB — URINALYSIS, W/ REFLEX TO CULTURE (INFECTION SUSPECTED)
Bilirubin Urine: NEGATIVE
Glucose, UA: >=500 mg/dL — AB
Hgb urine dipstick: NEGATIVE
Ketones, ur: NEGATIVE mg/dL
Leukocytes,Ua: NEGATIVE
Nitrite: NEGATIVE
Protein, ur: NEGATIVE mg/dL
Specific Gravity, Urine: 1.023 (ref 1.005–1.030)
pH: 5 (ref 5.0–8.0)

## 2023-11-10 LAB — CBC
HCT: 44.8 % (ref 36.0–46.0)
Hemoglobin: 15.1 g/dL — ABNORMAL HIGH (ref 12.0–15.0)
MCH: 31.1 pg (ref 26.0–34.0)
MCHC: 33.7 g/dL (ref 30.0–36.0)
MCV: 92.4 fL (ref 80.0–100.0)
Platelets: 289 K/uL (ref 150–400)
RBC: 4.85 MIL/uL (ref 3.87–5.11)
RDW: 12.6 % (ref 11.5–15.5)
WBC: 8.4 K/uL (ref 4.0–10.5)
nRBC: 0 % (ref 0.0–0.2)

## 2023-11-10 LAB — COMPREHENSIVE METABOLIC PANEL WITH GFR
ALT: 20 U/L (ref 0–44)
AST: 20 U/L (ref 15–41)
Albumin: 3.9 g/dL (ref 3.5–5.0)
Alkaline Phosphatase: 110 U/L (ref 38–126)
Anion gap: 16 — ABNORMAL HIGH (ref 5–15)
BUN: 17 mg/dL (ref 6–20)
CO2: 22 mmol/L (ref 22–32)
Calcium: 9.2 mg/dL (ref 8.9–10.3)
Chloride: 102 mmol/L (ref 98–111)
Creatinine, Ser: 1.33 mg/dL — ABNORMAL HIGH (ref 0.44–1.00)
GFR, Estimated: 46 mL/min — ABNORMAL LOW (ref >60)
Glucose, Bld: 112 mg/dL — ABNORMAL HIGH (ref 70–99)
Potassium: 3.8 mmol/L (ref 3.5–5.1)
Sodium: 140 mmol/L (ref 135–145)
Total Bilirubin: 0.9 mg/dL (ref 0.0–1.2)
Total Protein: 6.9 g/dL (ref 6.5–8.1)

## 2023-11-10 LAB — RESP PANEL BY RT-PCR (RSV, FLU A&B, COVID)  RVPGX2
Influenza A by PCR: NEGATIVE
Influenza B by PCR: NEGATIVE
Resp Syncytial Virus by PCR: NEGATIVE
SARS Coronavirus 2 by RT PCR: NEGATIVE

## 2023-11-10 LAB — PROTIME-INR
INR: 0.9 (ref 0.8–1.2)
Prothrombin Time: 12.4 s (ref 11.4–15.2)

## 2023-11-10 LAB — APTT: aPTT: 27 s (ref 24–36)

## 2023-11-10 MED ORDER — LORAZEPAM 1 MG PO TABS
1.0000 mg | ORAL_TABLET | Freq: Once | ORAL | Status: AC
  Administered 2023-11-10: 1 mg via ORAL
  Filled 2023-11-10: qty 1

## 2023-11-10 NOTE — ED Provider Notes (Signed)
 [place] [facility]MENT AT [facility] Provider Note  CSN: [id number] Arrival date & time: 11/10/23 1226  Chief Complaint(s) Transient Ischemic Attack  HPI Hannah Mcdaniel is a 58 y.o. female with history of prior CVA who is here today with an episode that occurred at around 7:00 this morning where she had overall weakness in her bilateral upper and lower extremities.  She felt like she could not walk.  She currently takes aspirin  and Plavix .  Denies any pain in her chest, abdomen, dysuria or fever.   Past Medical History Past Medical History:  Diagnosis Date   Anxiety    Arterial occlusive disease Nov. 2014   Arthritis    Claudication of lower extremity Nov. 2014   Right Lower Extremity rest pain   Colon polyps    Depression    Diabetes mellitus without complication (HCC)    GERD (gastroesophageal reflux disease)    Hyperlipidemia    Migraines    Vertigo    Patient Active Problem List   Diagnosis Date Noted   Statin myopathy [G72.0, T46.6X5A] 08/28/2023   Depression, recurrent 08/28/2023   Vitamin D  deficiency 12/19/2022   Breast lump on left side at 11 o'clock position 11/17/2022   Memory impairment 10/28/2022   History of CVA with residual deficit 08/01/2021   Difficulty sleeping 08/01/2021   Cryptogenic stroke (HCC) 05/10/2021   Hypertension associated with diabetes (HCC) 04/11/2021   Seasonal allergies 04/11/2020   Tobacco use disorder 05/27/2019   Hyperlipidemia associated with type 2 diabetes mellitus (HCC)    Migraine without aura and with status migrainosus, not intractable 04/29/2016   Type 2 diabetes mellitus with other specified complication (HCC) 01/24/2016   Generalized anxiety disorder 07/24/2015   PAD (peripheral artery disease) 02/09/2013   Home Medication(s) Prior to Admission medications   Medication Sig Start Date End Date Taking? Authorizing Provider  aspirin  EC 81 MG tablet Take 1 tablet (81 mg total) by mouth daily. Swallow  whole. 11/02/22   Drusilla Sabas RAMAN, MD  Blood Glucose Monitoring Suppl (ACCU-CHEK GUIDE ME) w/Device KIT Test BS TID Dx E11.69 03/11/23   Cathlene Marry Lenis, FNP  buPROPion  (WELLBUTRIN  XL) 150 MG 24 hr tablet Take 1 tablet (150 mg total) by mouth 2 (two) times daily. Take 1 tablet by mouth daily for 3 days. On day 4, increase to 1 tablet by mouth twice a day. 11/10/23   Joesph Annabella HERO, FNP  busPIRone  (BUSPAR ) 5 MG tablet Take 1 tablet (5 mg total) by mouth 2 (two) times daily. 10/01/23   Joesph Annabella HERO, FNP  clopidogrel  (PLAVIX ) 75 MG tablet Take 75 mg by mouth daily. 04/11/23   [provider]  cyanocobalamin  (VITAMIN B12) 1000 MCG tablet Take 1,000 mcg by mouth daily.    [provider]  donepezil  (ARICEPT ) 10 MG tablet Take 1 tablet (10 mg total) by mouth daily. 03/17/23   Wertman, Sara E, PA-C  empagliflozin  (JARDIANCE ) 10 MG TABS tablet Take 1 tablet (10 mg total) by mouth daily. 10/23/23   Joesph Annabella HERO, FNP  Evolocumab  (REPATHA  SURECLICK) 140 MG/ML SOAJ Inject 140 mg into the skin every 14 (fourteen) days. 10/06/23   Joesph Annabella HERO, FNP  ezetimibe  (ZETIA ) 10 MG tablet TAKE 1 TABLET BY MOUTH EVERY DAY 08/31/23   Joesph Annabella HERO, FNP  glucose blood (ACCU-CHEK GUIDE TEST) test strip Test BS TID Dx E11.69 03/11/23   Cathlene Marry Lenis, FNP  Lancet Device MISC Apply to device as directed 03/10/23  Cathlene Marry Lenis, FNP  losartan  (COZAAR ) 50 MG tablet Take 2 tablets (100 mg total) by mouth daily. 10/23/23   Joesph Annabella HERO, FNP  montelukast  (SINGULAIR ) 10 MG tablet TAKE 1 TABLET BY MOUTH EVERYDAY AT BEDTIME 08/31/23   Joesph Annabella HERO, FNP                                                                                                                                    Past Surgical History Past Surgical History:  Procedure Laterality Date   ABDOMINAL AORTAGRAM N/A 01/10/2013   Procedure: ABDOMINAL AORTAGRAM;  Surgeon: Krystal JULIANNA Doing, MD;  Location: [facility] CATH LAB;   Service: Cardiovascular;  Laterality: N/A;   ABDOMINAL AORTOGRAM W/LOWER EXTREMITY Bilateral 07/16/2018   Procedure: ABDOMINAL AORTOGRAM W/LOWER EXTREMITY;  Surgeon: Eliza Lonni RAMAN, MD;  Location: [facility] INVASIVE CV LAB;  Service: Cardiovascular;  Laterality: Bilateral;   ABDOMINAL AORTOGRAM W/LOWER EXTREMITY N/A 10/05/2020   Procedure: ABDOMINAL AORTOGRAM W/LOWER EXTREMITY;  Surgeon: Eliza Lonni RAMAN, MD;  Location: [facility] INVASIVE CV LAB;  Service: Cardiovascular;  Laterality: N/A;   BUBBLE STUDY  03/26/2021   Procedure: BUBBLE STUDY;  Surgeon: Kate Lonni CROME, MD;  Location: [facility] ENDOSCOPY;  Service: Cardiovascular;;   CHOLECYSTECTOMY     Gall Bladder   iliac artery angioplasty and stent placement  01/10/13   LOWER EXTREMITY ANGIOGRAM Bilateral 01/10/2013   Procedure: LOWER EXTREMITY ANGIOGRAM;  Surgeon: Krystal JULIANNA Doing, MD;  Location: [facility] CATH LAB;  Service: Cardiovascular;  Laterality: Bilateral;   PERCUTANEOUS STENT INTERVENTION Right 01/10/2013   Procedure: PERCUTANEOUS STENT INTERVENTION;  Surgeon: Krystal JULIANNA Doing, MD;  Location: [facility] CATH LAB;  Service: Cardiovascular;  Laterality: Right;  rt common iliac stent   PERIPHERAL VASCULAR CATHETERIZATION N/A 11/26/2015   Procedure: Abdominal Aortogram w/Lower Extremity;  Surgeon: Lonni RAMAN Eliza, MD;  Location: [facility] INVASIVE CV LAB;  Service: Cardiovascular;  Laterality: N/A;   PERIPHERAL VASCULAR CATHETERIZATION Right 11/26/2015   Procedure: Peripheral Vascular Balloon Angioplasty;  Surgeon: Lonni RAMAN Eliza, MD;  Location: [facility] INVASIVE CV LAB;  Service: Cardiovascular;  Laterality: Right;  rt common iliac   PERIPHERAL VASCULAR INTERVENTION  07/16/2018   Procedure: PERIPHERAL VASCULAR INTERVENTION;  Surgeon: Eliza Lonni RAMAN, MD;  Location: [facility] INVASIVE CV LAB;  Service: Cardiovascular;;   PERIPHERAL VASCULAR INTERVENTION Right 10/05/2020   Procedure: PERIPHERAL VASCULAR INTERVENTION;  Surgeon: Eliza Lonni RAMAN, MD;  Location:  [facility] INVASIVE CV LAB;  Service: Cardiovascular;  Laterality: Right;  Common and exteral iliac artery   TEE WITHOUT CARDIOVERSION N/A 03/26/2021   Procedure: TRANSESOPHAGEAL ECHOCARDIOGRAM (TEE);  Surgeon: Kate Lonni CROME, MD;  Location: [facility] ENDOSCOPY;  Service: Cardiovascular;  Laterality: N/A;   TOTAL ABDOMINAL HYSTERECTOMY     Family History Family History  Problem Relation Age of Onset   Diabetes Mother    Hyperlipidemia Mother    Hypertension Mother    Varicose Veins Mother    COPD Mother  Stroke Mother    Anxiety disorder Mother    Depression Mother    Ovarian cancer Mother    Acute myelogenous leukemia Father    Diabetes Brother    Hypertension Brother    Hyperlipidemia Brother    Stroke Maternal Grandmother    Anxiety disorder Maternal Grandmother    Depression Maternal Grandmother    Breast cancer Maternal Grandmother    Diabetes Maternal Grandmother    Heart disease Maternal Grandfather    Lung cancer Paternal Grandfather    Other Brother        Blood disease   Anxiety disorder Daughter    Migraines Daughter    Migraines Daughter    Migraines Son    Seizures Son     Social History Social History   Tobacco Use   Smoking status: Former    Current packs/day: 0.00    Average packs/day: 0.3 packs/day for 43.8 years (11.0 ttl pk-yrs)    Types: Cigarettes    Start date: 05/16/1977    Quit date: 03/21/2021    Years since quitting: 2.6   Smokeless tobacco: Never   Tobacco comments:    1/2 cigarette per day  Vaping Use   Vaping status: Never Used  Substance Use Topics   Alcohol use: Yes    Alcohol/week: 1.0 standard drink of alcohol    Types: 1 Shots of liquor per week    Comment: occ   Drug use: No   Allergies Crestor  [rosuvastatin ], Asa [aspirin ], Bempedoic acid , Codeine, and Penicillins  Review of Systems Review of Systems  Physical Exam Vital Signs  I have reviewed the triage vital signs BP (!) 160/76   Pulse 76   Temp 97.7 F (36.5 C)  (Oral)   Resp 15   Ht 5' 1 (1.549 m)   Wt 66 kg   SpO2 100%   BMI 27.49 kg/m   Physical Exam Vitals and nursing note reviewed.  Constitutional:      Appearance: Normal appearance. She is not toxic-appearing.  Eyes:     Pupils: Pupils are equal, round, and reactive to light.  Cardiovascular:     Rate and Rhythm: Normal rate.  Abdominal:     General: Abdomen is flat. There is no distension.     Palpations: Abdomen is soft.     Tenderness: There is no abdominal tenderness.  Musculoskeletal:        General: No swelling or deformity. Normal range of motion.     Cervical back: Normal range of motion.  Neurological:     Mental Status: She is alert.     Comments: Cranial nerves II through XII intact.  5/5 bilateral upper extremity strength and sensation.  Patient with some residual right leg numbness and weakness which she says is chronic for her.  She she feels as though her left leg is more weak than it usually is.  Feels as though her left leg is more weak than it usually is.     ED Results and Treatments Labs (all labs ordered are listed, but only abnormal results are displayed) Labs Reviewed  CBC - Abnormal; Notable for the following components:      Result Value   Hemoglobin 15.1 (*)    All other components within normal limits  COMPREHENSIVE METABOLIC PANEL WITH GFR - Abnormal; Notable for the following components:   Glucose, Bld 112 (*)    Creatinine, Ser 1.33 (*)    GFR, Estimated 46 (*)    Anion gap 16 (*)  All other components within normal limits  URINALYSIS, W/ REFLEX TO CULTURE (INFECTION SUSPECTED) - Abnormal; Notable for the following components:   APPearance HAZY (*)    Glucose, UA >=500 (*)    Bacteria, UA RARE (*)    All other components within normal limits  I-STAT CHEM 8, ED - Abnormal; Notable for the following components:   Creatinine, Ser 1.40 (*)    Glucose, Bld 110 (*)    Hemoglobin 15.3 (*)    All other components within normal limits  RESP  PANEL BY RT-PCR (RSV, FLU A&B, COVID)  RVPGX2  ETHANOL  PROTIME-INR  APTT  DIFFERENTIAL  RAPID URINE DRUG SCREEN, HOSP PERFORMED                                                                                                                          Radiology MR BRAIN WO CONTRAST Result Date: 11/10/2023 EXAM: MR Brain without Intravenous Contrast. CLINICAL HISTORY: Transient ischemic attack (TIA). Right side weakness. TECHNIQUE: Magnetic resonance images of the brain without intravenous contrast in multiple planes. CONTRAST: Without. COMPARISON: Same day CT head. MRI head 10/11/22 FINDINGS: BRAIN: No restricted diffusion to indicate acute infarction. No intracranial mass or acute hemorrhage. No midline shift or extra-axial fluid collection. The central arterial and venous flow voids are patent. Similar background of advanced T2/FLAIR hyperintensities, compatible with chronic macrovascular ischemic disease. Remote lacunar Infarcts in the cerebellum, pons, and left basal ganglia. REmote right MCA territory infarct with associated hemosiderin deposition compatible with prior hemorrhage. VENTRICLES: Similar ventriculomegaly. ORBITS: The orbits are normal. SINUSES AND MASTOIDS: The sinuses and mastoid air cells are clear. BONES: No acute fracture or focal osseous lesion. IMPRESSION: 1. No acute abnormality. 2. Remote right MCA territory infarct. 3. Severe chronic microvascular ischemic change and remote lacunar infarcts. 4. Similar ventriculomegaly. Electronically signed by: Gilmore Molt MD 11/10/2023 09:48 PM EDT RP Workstation: [id number]   DG Chest 1 View Result Date: 11/10/2023 CLINICAL DATA:  Weakness EXAM: CHEST  1 VIEW COMPARISON:  07/12/2005 FINDINGS: Cardiomediastinal silhouette and pulmonary vasculature are within normal limits. Lungs are clear. Implanted cardiac device seen in the LEFT chest wall. IMPRESSION: No acute cardiopulmonary process. Electronically Signed   By: Aliene Lloyd M.D.   On:  11/10/2023 13:56   CT HEAD WO CONTRAST Result Date: 11/10/2023 EXAM: CT HEAD WITHOUT CONTRAST 11/10/2023 01:06:00 PM TECHNIQUE: CT of the head was performed without the administration of intravenous contrast. Automated exposure control, iterative reconstruction, and/or weight based adjustment of the mA/kV was utilized to reduce the radiation dose to as low as reasonably achievable. COMPARISON: Brain MRI 10/31/2022 and head CT 10/31/2022. CLINICAL HISTORY: 58 year old female with mental status change, weakness, and history of previous stroke. Transient ischemic attack mentioned by paramedics. FINDINGS: BRAIN AND VENTRICLES: No acute hemorrhage. No evidence of acute infarct. No mass effect or midline shift. Confluent encephalomalacia has developed in the posterior right hemisphere. Underlying chronic nonspecific ventriculomegaly has not significantly changed since 2023. Patchy smaller chronic  encephalomalacia also in the left PCA territory of the left occipital lobe and posterior mesial left temporal lobe. Small chronic infarcts in the cerebellum, and advanced chronic small vessel disease in the pons appears stable. Chronic lacunar infarct in the left hemisphere at the anterior inferior basal ganglia. Advanced chronic ischemic disease appears stable since last year. ORBITS: No acute abnormality. SINUSES: No acute abnormality. SOFT TISSUES AND SKULL: No acute soft tissue abnormality. No skull fracture. Calcified atherosclerosis of the skull base. No suspicious intracranial vascular hyperdensity. IMPRESSION: 1. Advanced chronic ischemic disease appears stable since last year. No acute intracranial abnormality. Electronically signed by: Helayne Hurst MD 11/10/2023 01:34 PM EDT RP Workstation: [id number]    Pertinent labs & imaging results that were available during my care of the patient were reviewed by me and considered in my medical decision making (see MDM for details).  Medications Ordered in ED Medications   LORazepam (ATIVAN) tablet 1 mg (1 mg Oral Given 11/10/23 1930)                                                                                                                                     Procedures Procedures  (including critical care time)  Medical Decision Making / ED Course   This patient presents to the ED for concern of episode of inability to walk, confusion and overall weakness, this involves an extensive number of treatment options, and is a complaint that carries with it a high risk of complications and morbidity.  The differential diagnosis includes TIA, CVA, underlying infection, electrolyte abnormalities.  MDM:  58 year old female who is here today for a period of generalized weakness, inability to walk and confusion.  At this time, patient endorses some continued minor right lower extremity weakness.  Patient has good strength bilateral lower extremities per my assessment.  Obtained blood work.  Her CT imaging of her head shows no intracranial hemorrhage.  I have ordered MRI.  Reassessment 10 PM-patient's MRI of brain negative.  She currently feels back to her baseline, is ambulating at her baseline.  Her renal function is at her baseline.  Discussed patient's findings with her and her husband.  They feel comfortable with discharge.  Patient currently already on dual antiplatelet.   Additional history obtained: -Additional history obtained from husband at bedside -External records from outside source obtained and reviewed including: Chart review including previous notes, labs, imaging, consultation notes   Lab Tests: -I ordered, reviewed, and interpreted labs.   The pertinent results include:   Labs Reviewed  CBC - Abnormal; Notable for the following components:      Result Value   Hemoglobin 15.1 (*)    All other components within normal limits  COMPREHENSIVE METABOLIC PANEL WITH GFR - Abnormal; Notable for the following components:   Glucose, Bld 112 (*)     Creatinine, Ser 1.33 (*)    GFR, Estimated 46 (*)    Anion  gap 16 (*)    All other components within normal limits  URINALYSIS, W/ REFLEX TO CULTURE (INFECTION SUSPECTED) - Abnormal; Notable for the following components:   APPearance HAZY (*)    Glucose, UA >=500 (*)    Bacteria, UA RARE (*)    All other components within normal limits  I-STAT CHEM 8, ED - Abnormal; Notable for the following components:   Creatinine, Ser 1.40 (*)    Glucose, Bld 110 (*)    Hemoglobin 15.3 (*)    All other components within normal limits  RESP PANEL BY RT-PCR (RSV, FLU A&B, COVID)  RVPGX2  ETHANOL  PROTIME-INR  APTT  DIFFERENTIAL  RAPID URINE DRUG SCREEN, HOSP PERFORMED      EKG   EKG Interpretation Date/Time:  Tuesday November 10 2023 12:47:28 EDT Ventricular Rate:  76 PR Interval:  176 QRS Duration:  72 QT Interval:  384 QTC Calculation: 432 R Axis:   60  Text Interpretation: Normal sinus rhythm Normal ECG When compared with ECG of 31-Oct-2022 11:21, PREVIOUS ECG IS PRESENT Confirmed by Mannie Pac [phone]) on 11/10/2023 1:34:43 PM         Imaging Studies ordered: I ordered imaging studies including CT head, MRI brain I independently visualized and interpreted imaging. I agree with the radiologist interpretation   Medicines ordered and prescription drug management: Meds ordered this encounter  Medications   LORazepam (ATIVAN) tablet 1 mg    -I have reviewed the patients home medicines and have made adjustments as needed   Cardiac Monitoring: The patient was maintained on a cardiac monitor.  I personally viewed and interpreted the cardiac monitored which showed an underlying rhythm of: Normal sinus rhythm  Social Determinants of Health:  Factors impacting patients care include: Lack of access to primary care   Reevaluation: After the interventions noted above, I reevaluated the patient and found that they have :improved  Co morbidities that complicate the patient  evaluation  Past Medical History:  Diagnosis Date   Anxiety    Arterial occlusive disease Nov. 2014   Arthritis    Claudication of lower extremity Nov. 2014   Right Lower Extremity rest pain   Colon polyps    Depression    Diabetes mellitus without complication (HCC)    GERD (gastroesophageal reflux disease)    Hyperlipidemia    Migraines    Vertigo       Dispostion: I considered admission for this patient, however with her reassuring workup, plan to discharge.     Final Clinical Impression(s) / ED Diagnoses Final diagnoses:  Generalized weakness  TIA (transient ischemic attack)     @PCDICTATION @    Mannie Pac T, DO 11/10/23 2222

## 2023-11-10 NOTE — ED Notes (Signed)
 CCMD called and verified patient on cardiac telemetry

## 2023-11-10 NOTE — ED Provider Triage Note (Signed)
 Emergency Medicine Provider Triage Evaluation Note  Hannah Mcdaniel , a 58 y.o. female  was evaluated in triage.  Pt complains of weakness.  Apparently there was mention of TIA from the paramedics.  Patient states around 10 AM she has symmetrical weakness in bilateral upper and lower extremities.  No other complaints.  This has completely resolved.  Currently she states she feels fatigue and just not well overall but denies any focal weakness.  She does have history of previous stroke.  No chest pain..  On my exam patient has normal speech, without facial droop, without pronator drift.  5/5 strength in bilateral upper and lower extremities.  Sensation symmetric bilaterally.  Cranial nerves III through XII intact.  Review of Systems  Positive: As above Negative: As above  Physical Exam  BP (!) 144/80   Pulse 78   Temp 98 F (36.7 C) (Oral)   Resp 18   Ht 5' 1 (1.549 m)   Wt 66 kg   SpO2 98%   BMI 27.49 kg/m  Gen:   Awake, no distress   Resp:  Normal effort  MSK:   Moves extremities without difficulty  Other:    Medical Decision Making  Medically screening exam initiated at 12:40 PM.  Appropriate orders placed.  Hannah Mcdaniel was informed that the remainder of the evaluation will be completed by another provider, this initial triage assessment does not replace that evaluation, and the importance of remaining in the ED until their evaluation is complete.     Hildegard Loge, PA-C 11/10/23 1241

## 2023-11-10 NOTE — ED Triage Notes (Signed)
 Pt to ER via EMS reports waking at 0700 normal, states at 10AM she had onset of right side weakness and hyperventilating.  Pt has hx of CVA and reports mild deficits to left.  Per EMS symptoms resolved on their arrival.  Pt with minimal weakness to right arm and leg on ED assessment, no other deficits noted.

## 2023-11-10 NOTE — ED Notes (Signed)
 Pt in MRI.

## 2023-11-10 NOTE — ED Notes (Signed)
 MD bedside

## 2023-11-10 NOTE — Discharge Instructions (Signed)
 While you were in the emergency room, you have blood work done that was normal.  Your urine does not show any signs of infection.  Your MRI of your brain was normal as well.  There are no changes from your prior stroke.  Please continue taking all medications as prescribed.  Follow-up with your primary care doctor within 1 to 2 weeks.  Return to the emergency room for any new or worsening symptoms.

## 2023-11-11 ENCOUNTER — Encounter: Payer: Self-pay | Admitting: Family Medicine

## 2023-11-11 LAB — CUP PACEART REMOTE DEVICE CHECK
Date Time Interrogation Session: 20251006231757
Implantable Pulse Generator Implant Date: 20230508

## 2023-11-13 NOTE — Progress Notes (Signed)
 Remote Loop Recorder Transmission

## 2023-11-16 ENCOUNTER — Telehealth: Payer: Self-pay | Admitting: Pharmacy Technician

## 2023-11-16 ENCOUNTER — Other Ambulatory Visit (HOSPITAL_COMMUNITY): Payer: Self-pay

## 2023-11-16 NOTE — Progress Notes (Signed)
   11/16/2023 Name: Hannah Mcdaniel MRN: [id number] DOB: [birth year]  Patient is appearing on a referral for medication access and lhas never been engaged with the clinical pharmacist. . Contacted patient today to discuss medication access.  The following message was received from the Pharmacist: Kate, can you help this patient with getting repatha  at pharmacy? I'm out of office until Wednesday.  Please tell patient to call the [facility] office bc we do have samples of Repatha  if it can't be figured out.Thanks! Julie   Medication Adherence Barriers Addressed/Actions Taken:  Reviewed medication changes per plan from last clinical pharmacist note Medication Access for Repatha  Will discuss medication access concerns with pharmacist Contacted pharmacy regarding new prescriptions Called [facility] and spoke to the pharmacist. The cost to the patient with insurance and coupon card is $480.36. Upon reviewing patient's insurance card. Patient appears to be on a high deductible plan [phone]). It appears the deductible has not been met yet per test claim ran by CPhT. Therefore, the coupon card is only taking off a small amount from what the insurance covered. Call placed to patient. Spoke to patient's daughter. HIPAA verified. Informed patient's daughter that the patient appears to have not met her deductible yet and therefore the card is only taking off a small amount as the copay is high due to deductible not being met. Encouraged patient's daughter to call clinic [facility] and request samples as outline by PharmD above. Patient's daughter informs she will call. Patient's daughter informs patient receives disability check but does not qualify for other programs.  Responded to PharmD's message and inquired if social work may be able to assist patient in any way.  Next clinical pharmacist appointment is scheduled for: TBD  Kate Caddy, CPhT [facility] Office: [phone] Email:  [email or web address]

## 2023-11-18 ENCOUNTER — Ambulatory Visit: Payer: Self-pay | Admitting: Internal Medicine

## 2023-11-18 ENCOUNTER — Telehealth: Payer: Self-pay | Admitting: Pharmacist

## 2023-11-18 NOTE — Telephone Encounter (Signed)
 NEEDS REPATHA  HELP (has nurse, learning disability and there are no resources for high deductible commercial plans)) SAMPLES (we currently don't have) WILL CALL ON 10/16

## 2023-11-26 ENCOUNTER — Telehealth: Payer: Self-pay | Admitting: Pharmacist

## 2023-11-26 NOTE — Telephone Encounter (Addendum)
 Ref [id number] placed for SW assistance Patient had a stroke and the daughter reports disability, however the patient still has nurse, learning disability [phone] high deductible). She really needs a stroke medication (Repatha ), but it's over $400-500/month.  Would she qualify for medicare or med D? Is there someone I should forward this too? I've explored all resources on my end.  Repatha  samples left up front for patient to pick up  Mliss Tarry Griffin, PharmD, BCACP, CPP Clinical Pharmacist, [facility]

## 2023-11-27 ENCOUNTER — Ambulatory Visit: Admitting: Family Medicine

## 2023-11-27 ENCOUNTER — Encounter: Payer: Self-pay | Admitting: Family Medicine

## 2023-11-27 ENCOUNTER — Other Ambulatory Visit: Payer: Self-pay | Admitting: Family Medicine

## 2023-11-27 VITALS — BP 155/86 | HR 86 | Temp 98.0°F | Ht 61.0 in | Wt 152.4 lb

## 2023-11-27 DIAGNOSIS — E1159 Type 2 diabetes mellitus with other circulatory complications: Secondary | ICD-10-CM

## 2023-11-27 DIAGNOSIS — E1169 Type 2 diabetes mellitus with other specified complication: Secondary | ICD-10-CM | POA: Diagnosis not present

## 2023-11-27 DIAGNOSIS — I6930 Unspecified sequelae of cerebral infarction: Secondary | ICD-10-CM | POA: Diagnosis not present

## 2023-11-27 DIAGNOSIS — E785 Hyperlipidemia, unspecified: Secondary | ICD-10-CM

## 2023-11-27 DIAGNOSIS — G459 Transient cerebral ischemic attack, unspecified: Secondary | ICD-10-CM

## 2023-11-27 DIAGNOSIS — I152 Hypertension secondary to endocrine disorders: Secondary | ICD-10-CM

## 2023-11-27 MED ORDER — CLOPIDOGREL BISULFATE 75 MG PO TABS: 75.0000 mg | ORAL_TABLET | Freq: Every day | ORAL | 3 refills | Status: AC

## 2023-11-27 NOTE — Progress Notes (Deleted)
 Hannah Mcdaniel

## 2023-11-27 NOTE — Progress Notes (Signed)
 Established Patient Office Visit  Patient ID: Hannah Mcdaniel, female    DOB: 02-24-1965  Age: 58 y.o. MRN: [id number] PCP: Joesph Annabella HERO, FNP  Chief Complaint  Patient presents with   Transient Ischemic Attack    Subjective:     HPI  History of Present Illness   Hannah Mcdaniel is a 58 year old female with a history of stroke who presents with increased weakness and difficulty walking.  She experienced increased weakness in all extremities and difficulty walking, prompting a visit to the emergency room on October 7th. Her weakness is more pronounced than usual, and she continues to feel weak and stumbles frequently. Despite these symptoms, she is reluctant to use a walker or rollator, although her caregiver believes it would provide more stability and prevent falls.  During the emergency room visit, an EKG showed normal sinus rhythm, and lab tests revealed a slight dip in kidney function. A chest X-ray was stable, and a CT and MRI of the head showed no acute changes, only stable chronic changes. A COVID panel was negative.  She has a history of a stroke in September of the previous year and another in February of this year. A loop recorder was implanted in May 2023 to monitor for atrial fibrillation (AFib), but no episodes have been recorded, and she has not shown signs of AFib on EKGs or the loop recorder.  She is currently taking aspirin  and Plavix , with a need for a refill on Plavix . Will be starting Repatha  with samples today. Her blood pressure is generally well-controlled at home, except when she becomes anxious, which can cause it to rise.  She has previously attempted outpatient physical therapy, which was not found to be helpful. Despite her weakness, she remains active, performing tasks such as checking the mail and folding laundry.  No chest pain or shortness of breath. She reports numbness and tingling in the right side, with improved swelling since discontinuing  hydrochlorothiazide .        ROS As per HPI.    Objective:     BP (!) 155/86   Pulse 86   Temp 98 F (36.7 C) (Temporal)   Ht 5' 1 (1.549 m)   Wt 152 lb 6.4 oz (69.1 kg)   SpO2 100%   BMI 28.80 kg/m    Physical Exam Vitals and nursing note reviewed.      No results found for any visits on 11/27/23.    The ASCVD Risk score (Arnett DK, et al., 2019) failed to calculate for the following reasons:   Risk score cannot be calculated because patient has a medical history suggesting prior/existing ASCVD    Assessment & Plan:   Hannah Mcdaniel was seen today for transient ischemic attack.  Diagnoses and all orders for this visit:  TIA (transient ischemic attack) -     CBC with Differential/Platelet -     BMP8+EGFR  History of CVA with residual deficit -     clopidogrel  (PLAVIX ) 75 MG tablet; Take 1 tablet (75 mg total) by mouth daily.  Hypertension associated with diabetes (HCC)  Hyperlipidemia associated with type 2 diabetes mellitus (HCC)   Assessment and Plan    Transient Ischemic Attack  Hx of CVA with residual deficits - Recommend use of rollator for stability and fall prevention. - Avoid wheelchair use to maintain muscle strength. - Encourage maintaining activity to prevent deconditioning. - Contact neurologist for follow-up regarding recent TIA. - Declined PT referral as this was not beneficial  previously.  - Continue asprin and plavix .   Hypertension Blood pressure generally well-controlled at home, typically in the 120s/80s range. Anxiety episodes can cause transient elevations. - Monitor blood pressure at home once or twice a week when relaxed. - Report if blood pressure consistently >= 130/90 mmHg.  Chronic kidney disease, unspecified stage Slight dip in kidney function noted in recent labs. - Order lab work to recheck kidney function.  Hyperlipidemia Provided with Repatha  samples today. Awaiting deductible to be met for prescription coverage.  Referral to social worker for assistance with financial aspects and potential Medicaid coverage has been placed by clinical pharmacist.  - Notify when on last box of Repatha  samples to arrange for more samples if needed.      Keep scheduled follow up. Sooner as needed.    Annabella CHRISTELLA Search, FNP [facility]

## 2023-11-28 LAB — BMP8+EGFR
BUN/Creatinine Ratio: 11 (ref 9–23)
BUN: 12 mg/dL (ref 6–24)
CO2: 20 mmol/L (ref 20–29)
Calcium: 10.3 mg/dL — ABNORMAL HIGH (ref 8.7–10.2)
Chloride: 100 mmol/L (ref 96–106)
Creatinine, Ser: 1.14 mg/dL — ABNORMAL HIGH (ref 0.57–1.00)
Glucose: 99 mg/dL (ref 70–99)
Potassium: 4.1 mmol/L (ref 3.5–5.2)
Sodium: 139 mmol/L (ref 134–144)
eGFR: 55 mL/min/1.73 — ABNORMAL LOW (ref >59)

## 2023-11-28 LAB — CBC WITH DIFFERENTIAL/PLATELET
Basophils Absolute: 0.1 x10E3/uL (ref 0.0–0.2)
Basos: 1 %
EOS (ABSOLUTE): 0.1 x10E3/uL (ref 0.0–0.4)
Eos: 1 %
Hematocrit: 49.1 % — ABNORMAL HIGH (ref 34.0–46.6)
Hemoglobin: 16.2 g/dL — ABNORMAL HIGH (ref 11.1–15.9)
Immature Grans (Abs): 0 x10E3/uL (ref 0.0–0.1)
Immature Granulocytes: 0 %
Lymphocytes Absolute: 2.6 x10E3/uL (ref 0.7–3.1)
Lymphs: 28 %
MCH: 31.1 pg (ref 26.6–33.0)
MCHC: 33 g/dL (ref 31.5–35.7)
MCV: 94 fL (ref 79–97)
Monocytes Absolute: 0.5 x10E3/uL (ref 0.1–0.9)
Monocytes: 5 %
Neutrophils Absolute: 5.9 x10E3/uL (ref 1.4–7.0)
Neutrophils: 65 %
Platelets: 338 x10E3/uL (ref 150–450)
RBC: 5.21 x10E6/uL (ref 3.77–5.28)
RDW: 12.3 % (ref 11.7–15.4)
WBC: 9.1 x10E3/uL (ref 3.4–10.8)

## 2023-11-30 ENCOUNTER — Ambulatory Visit: Payer: Self-pay | Admitting: Family Medicine

## 2023-11-30 ENCOUNTER — Other Ambulatory Visit: Payer: Self-pay

## 2023-11-30 ENCOUNTER — Encounter: Payer: Self-pay | Admitting: Podiatry

## 2023-11-30 ENCOUNTER — Ambulatory Visit: Admitting: Podiatry

## 2023-11-30 DIAGNOSIS — E1169 Type 2 diabetes mellitus with other specified complication: Secondary | ICD-10-CM

## 2023-11-30 DIAGNOSIS — B351 Tinea unguium: Secondary | ICD-10-CM

## 2023-11-30 DIAGNOSIS — M79675 Pain in left toe(s): Secondary | ICD-10-CM

## 2023-11-30 DIAGNOSIS — I739 Peripheral vascular disease, unspecified: Secondary | ICD-10-CM | POA: Diagnosis not present

## 2023-11-30 DIAGNOSIS — M79674 Pain in right toe(s): Secondary | ICD-10-CM | POA: Diagnosis not present

## 2023-11-30 DIAGNOSIS — G459 Transient cerebral ischemic attack, unspecified: Secondary | ICD-10-CM

## 2023-11-30 NOTE — Progress Notes (Signed)
  Subjective:  Patient ID: Hannah Mcdaniel, female    DOB: 15-Mar-1965,   MRN: [id number]  Chief Complaint  Patient presents with   Diabetes    I'm Diabetic.  My doctor sent me here.  Saw Annabella Search, FNP;  A1c - 6.4    58 y.o. female presents for concern of thickened elongated and painful nails that are difficult to trim. Requesting to have them trimmed today. Relates burning and tingling in their feet. Patient is diabetic and last A1c was  Lab Results  Component Value Date   HGBA1C 6.4 (H) 10/01/2023   .   PCP:  Search Annabella HERO, FNP    . Denies any other pedal complaints. Denies n/v/f/c.   Past Medical History:  Diagnosis Date   Anxiety    Arterial occlusive disease Nov. 2014   Arthritis    Claudication of lower extremity Nov. 2014   Right Lower Extremity rest pain   Colon polyps    Depression    Diabetes mellitus without complication (HCC)    GERD (gastroesophageal reflux disease)    Hyperlipidemia    Migraines    Vertigo     Objective:  Physical Exam: Vascular: DP/PT pulses  0/4 bilateral. CFT <5 seconds. Feet cold to touch. Absent hair growth on digits. Edema noted to bilateral lower extremities. Xerosis noted bilaterally.  Skin. No lacerations or abrasions bilateral feet. Nails 1-5 bilateral  are thickened discolored and elongated with subungual debris.  Musculoskeletal: MMT 5/5 bilateral lower extremities in DF, PF, Inversion and Eversion. Deceased ROM in DF of ankle joint.  Neurological: Sensation intact to light touch. Protective sensation intact bilateral.    Assessment:   1. Pain due to onychomycosis of toenails of both feet      Plan:  Patient was evaluated and treated and all questions answered. -Discussed and educated patient on diabetic foot care, especially with  regards to the vascular, neurological and musculoskeletal systems.  -Stressed the importance of good glycemic control and the detriment of not  controlling glucose levels in  relation to the foot. -Discussed supportive shoes at all times and checking feet regularly.  -Mechanically debrided all nails 1-5 bilateral using sterile nail nipper and filed with dremel without incident  -Answered all patient questions -Follows with vascular -Patient to return  in 3 months for at risk foot care -Patient advised to call the office if any problems or questions arise in the meantime.   Asberry Failing, DPM

## 2023-12-02 ENCOUNTER — Telehealth: Payer: Self-pay

## 2023-12-02 DIAGNOSIS — I739 Peripheral vascular disease, unspecified: Secondary | ICD-10-CM

## 2023-12-02 NOTE — Addendum Note (Signed)
 Addended by: JOESPH ANNABELLA HERO on: 12/02/2023 02:31 PM   Modules accepted: Orders

## 2023-12-02 NOTE — Telephone Encounter (Signed)
 Copied from CRM #[id number]. Topic: Referral - Request for Referral >> Dec 02, 2023 10:16 AM Tinnie C wrote: Did the patient discuss referral with their provider in the last year? No, dr aware of problem   Appointment offered? No  Type of order/referral and detailed reason for visit: Pt saw podiatrist Monday and they said she needs to see a vascular surgeon. She has very poor blood flow in feet. Her last vascular surgeon has retired it looks like.   Preference of office, provider, location: No preference  If referral order, have you been seen by this specialty before? Yes Yes, Dr. Lonni blade, no longer practicing. She has peripheral artery disease/PVD and had to have emergency stent.   Can we respond through MyChart? Yes, but please call her at [phone] with any questions or information. Daughter brandy is managing this.

## 2023-12-02 NOTE — Telephone Encounter (Signed)
 Spoke with patients daughter per signed DPR.  They are okay with going to [place].

## 2023-12-02 NOTE — Telephone Encounter (Signed)
 Ok to place referral to vascular for PAD. Does she have a preference for location?

## 2023-12-02 NOTE — Telephone Encounter (Signed)
 Referral placed.

## 2023-12-06 ENCOUNTER — Other Ambulatory Visit: Payer: Self-pay | Admitting: Family Medicine

## 2023-12-06 DIAGNOSIS — F339 Major depressive disorder, recurrent, unspecified: Secondary | ICD-10-CM

## 2023-12-06 DIAGNOSIS — F17200 Nicotine dependence, unspecified, uncomplicated: Secondary | ICD-10-CM

## 2023-12-11 ENCOUNTER — Ambulatory Visit: Payer: Self-pay

## 2023-12-11 DIAGNOSIS — I639 Cerebral infarction, unspecified: Secondary | ICD-10-CM | POA: Diagnosis not present

## 2023-12-13 LAB — CUP PACEART REMOTE DEVICE CHECK
Date Time Interrogation Session: 20251106235228
Implantable Pulse Generator Implant Date: 20230508

## 2023-12-14 ENCOUNTER — Ambulatory Visit: Payer: Self-pay | Admitting: Internal Medicine

## 2023-12-14 ENCOUNTER — Other Ambulatory Visit: Payer: Self-pay

## 2023-12-14 DIAGNOSIS — G459 Transient cerebral ischemic attack, unspecified: Secondary | ICD-10-CM

## 2023-12-15 LAB — CBC WITH DIFFERENTIAL/PLATELET
Basophils Absolute: 0.1 x10E3/uL (ref 0.0–0.2)
Basos: 1 %
EOS (ABSOLUTE): 0.1 x10E3/uL (ref 0.0–0.4)
Eos: 1 %
Hematocrit: 45.8 % (ref 34.0–46.6)
Hemoglobin: 15.4 g/dL (ref 11.1–15.9)
Immature Grans (Abs): 0 x10E3/uL (ref 0.0–0.1)
Immature Granulocytes: 0 %
Lymphocytes Absolute: 3 x10E3/uL (ref 0.7–3.1)
Lymphs: 36 %
MCH: 31.7 pg (ref 26.6–33.0)
MCHC: 33.6 g/dL (ref 31.5–35.7)
MCV: 94 fL (ref 79–97)
Monocytes Absolute: 0.5 x10E3/uL (ref 0.1–0.9)
Monocytes: 6 %
Neutrophils Absolute: 4.8 x10E3/uL (ref 1.4–7.0)
Neutrophils: 56 %
Platelets: 291 x10E3/uL (ref 150–450)
RBC: 4.86 x10E6/uL (ref 3.77–5.28)
RDW: 11.8 % (ref 11.7–15.4)
WBC: 8.4 x10E3/uL (ref 3.4–10.8)

## 2023-12-15 LAB — BASIC METABOLIC PANEL WITH GFR
BUN/Creatinine Ratio: 10 (ref 9–23)
BUN: 14 mg/dL (ref 6–24)
CO2: 23 mmol/L (ref 20–29)
Calcium: 9.8 mg/dL (ref 8.7–10.2)
Chloride: 102 mmol/L (ref 96–106)
Creatinine, Ser: 1.35 mg/dL — ABNORMAL HIGH (ref 0.57–1.00)
Glucose: 103 mg/dL — ABNORMAL HIGH (ref 70–99)
Potassium: 4.4 mmol/L (ref 3.5–5.2)
Sodium: 141 mmol/L (ref 134–144)
eGFR: 45 mL/min/1.73 — ABNORMAL LOW (ref >59)

## 2023-12-15 NOTE — Progress Notes (Signed)
 Remote Loop Recorder Transmission

## 2023-12-16 ENCOUNTER — Ambulatory Visit: Payer: Self-pay | Admitting: Family Medicine

## 2024-01-11 ENCOUNTER — Ambulatory Visit: Payer: Self-pay | Admitting: Family Medicine

## 2024-01-11 ENCOUNTER — Ambulatory Visit: Payer: Self-pay

## 2024-01-12 LAB — CUP PACEART REMOTE DEVICE CHECK
Date Time Interrogation Session: 20251207231947
Implantable Pulse Generator Implant Date: 20230508

## 2024-01-13 ENCOUNTER — Ambulatory Visit: Payer: Self-pay | Admitting: Internal Medicine

## 2024-01-14 ENCOUNTER — Ambulatory Visit: Admitting: Family Medicine

## 2024-01-14 VITALS — BP 135/65 | HR 80 | Temp 98.0°F | Ht 61.0 in | Wt 151.2 lb

## 2024-01-14 DIAGNOSIS — I6930 Unspecified sequelae of cerebral infarction: Secondary | ICD-10-CM

## 2024-01-14 DIAGNOSIS — F339 Major depressive disorder, recurrent, unspecified: Secondary | ICD-10-CM | POA: Diagnosis not present

## 2024-01-14 DIAGNOSIS — F411 Generalized anxiety disorder: Secondary | ICD-10-CM

## 2024-01-14 DIAGNOSIS — Z23 Encounter for immunization: Secondary | ICD-10-CM

## 2024-01-14 DIAGNOSIS — E1159 Type 2 diabetes mellitus with other circulatory complications: Secondary | ICD-10-CM | POA: Diagnosis not present

## 2024-01-14 DIAGNOSIS — I152 Hypertension secondary to endocrine disorders: Secondary | ICD-10-CM

## 2024-01-14 DIAGNOSIS — R809 Proteinuria, unspecified: Secondary | ICD-10-CM | POA: Diagnosis not present

## 2024-01-14 DIAGNOSIS — E1129 Type 2 diabetes mellitus with other diabetic kidney complication: Secondary | ICD-10-CM

## 2024-01-14 DIAGNOSIS — Z87891 Personal history of nicotine dependence: Secondary | ICD-10-CM | POA: Diagnosis not present

## 2024-01-14 DIAGNOSIS — E1169 Type 2 diabetes mellitus with other specified complication: Secondary | ICD-10-CM | POA: Diagnosis not present

## 2024-01-14 DIAGNOSIS — G720 Drug-induced myopathy: Secondary | ICD-10-CM | POA: Diagnosis not present

## 2024-01-14 DIAGNOSIS — T466X5A Adverse effect of antihyperlipidemic and antiarteriosclerotic drugs, initial encounter: Secondary | ICD-10-CM

## 2024-01-14 DIAGNOSIS — E785 Hyperlipidemia, unspecified: Secondary | ICD-10-CM | POA: Diagnosis not present

## 2024-01-14 LAB — BAYER DCA HB A1C WAIVED: HB A1C (BAYER DCA - WAIVED): 6.4 % — ABNORMAL HIGH (ref 4.8–5.6)

## 2024-01-14 MED ORDER — ASPIRIN 81 MG PO TBEC: 81.0000 mg | DELAYED_RELEASE_TABLET | Freq: Every day | ORAL | 12 refills | Status: AC

## 2024-01-14 MED ORDER — BUSPIRONE HCL 7.5 MG PO TABS: 7.5000 mg | ORAL_TABLET | Freq: Two times a day (BID) | ORAL | 3 refills | Status: AC

## 2024-01-14 NOTE — Progress Notes (Unsigned)
 Established Patient Office Visit  Subjective   Patient ID: Hannah Mcdaniel, female    DOB: 10-27-1965  Age: 58 y.o. MRN: [id number]  Chief Complaint  Patient presents with   Medical Management of Chronic Issues    HPI  History of Present Illness   Hannah Mcdaniel is a 58 year old female with hyperlipidemia and anxiety who presents for a follow-up visit.  Hyperlipidemia management History of CVA - Received two doses of Repatha ; only one dose remaining due to difficulty obtaining further supply. - Unaffordable for patient. Clinical pharmacist has exhausted all options for assistance with cost - Current medications include Jardiance , Plavix , Zetia , and losartan .  Anxiety symptoms - Anxiety has not been well-managed. - Currently taking Wellbutrin  and Buspar  5 mg twice daily. - No side effects from Buspar . - Minimal improvement in anxiety symptoms.  Cardiopulmonary symptoms - No chest pain. - No shortness of breath.  Glycemic control - Blood sugars have been well-controlled.  Immunization status - Recently received shingles vaccine.  Tobacco use - Not smoking.          01/14/2024    2:58 PM 10/01/2023    3:03 PM 08/28/2023   11:44 AM  Depression screen PHQ 2/9  Decreased Interest 0 0 3  Down, Depressed, Hopeless 1 1 3   PHQ - 2 Score 1 1 6   Altered sleeping 3 0 0  Tired, decreased energy 0 0 0  Change in appetite 0 0 2  Feeling bad or failure about yourself  0 0 3  Trouble concentrating 0 0 3  Moving slowly or fidgety/restless 0 0 0  Suicidal thoughts 0 0 0  PHQ-9 Score 4 1  14    Difficult doing work/chores Somewhat difficult Not difficult at all Somewhat difficult     Data saved with a previous flowsheet row definition      01/14/2024    2:59 PM 10/01/2023    3:03 PM 08/28/2023   11:45 AM 03/10/2023    3:52 PM  GAD 7 : Generalized Anxiety Score  Nervous, Anxious, on Edge 0 1 2 0  Control/stop worrying 0 0 1 0  Worry too much - different things 0 0 0 0   Trouble relaxing 0 1 0 0  Restless 2 0 0 0  Easily annoyed or irritable 2 1 3  0  Afraid - awful might happen 0 1 0 0  Total GAD 7 Score 4 4 6  0  Anxiety Difficulty Not difficult at all Somewhat difficult Somewhat difficult Not difficult at all       ROS As per HPI.   Objective:     BP 135/65 Comment: at home reading per pt  Pulse 80   Temp 98 F (36.7 C) (Temporal)   Ht 5' 1 (1.549 m)   Wt 151 lb 3.2 oz (68.6 kg)   SpO2 96%   BMI 28.57 kg/m    Physical Exam Vitals and nursing note reviewed.  Constitutional:      General: She is not in acute distress.    Appearance: She is not ill-appearing, toxic-appearing or diaphoretic.  Cardiovascular:     Rate and Rhythm: Normal rate and regular rhythm.     Heart sounds: Normal heart sounds. No murmur heard. Pulmonary:     Effort: Pulmonary effort is normal. No respiratory distress.     Breath sounds: Normal breath sounds. No wheezing, rhonchi or rales.  Musculoskeletal:     Right lower leg: No edema.     Left  lower leg: No edema.  Skin:    General: Skin is warm and dry.  Neurological:     Mental Status: She is alert and oriented to person, place, and time. Mental status is at baseline.  Psychiatric:        Mood and Affect: Mood normal.        Behavior: Behavior normal.      No results found for any visits on 01/14/24.    The ASCVD Risk score (Arnett DK, et al., 2019) failed to calculate for the following reasons:   Risk score cannot be calculated because patient has a medical history suggesting prior/existing ASCVD   * - Cholesterol units were assumed    Assessment & Plan:   Hannah Mcdaniel was seen today for medical management of chronic issues.  Diagnoses and all orders for this visit:  Type 2 diabetes mellitus with albuminuria (HCC) -     Bayer DCA Hb A1c Waived -     Microalbumin / creatinine urine ratio  History of CVA with residual deficit -     aspirin  EC 81 MG tablet; Take 1 tablet (81 mg total) by  mouth daily. Swallow whole.  Hyperlipidemia associated with type 2 diabetes mellitus (HCC) -     Lipid panel  Statin myopathy [G72.0, T46.6X5A]  Hypertension associated with diabetes (HCC)  Depression, recurrent  Generalized anxiety disorder -     busPIRone  (BUSPAR ) 7.5 MG tablet; Take 1 tablet (7.5 mg total) by mouth 2 (two) times daily.  Former smoker  Need for vaccination -     Varicella-zoster vaccine IM   Assessment and Plan    Type 2 diabetes mellitus with diabetic kidney complication, hypertension, and hyperlipidemia Diabetes well-controlled, hypertension well-managed, hyperlipidemia management affected by financial constraints. - Continue Jardiance . - Continue losartan . - Provided Repatha  samples. - Checked social work referral status.  History CVA Continue repatha . Provided samples. Continue zetia , plavix .   Generalized anxiety disorder Anxiety not well-controlled, minimal relief from Buspar  5 mg. - Increased Buspar  to 7.5 mg twice daily. - Monitor symptoms for six weeks.  Major depressive disorder, recurrent Former smoker Wellbutrin  effective for depression and smoking relapse prevention. - Continue Wellbutrin .  General health maintenance Shingles vaccine administered. - Administered shingles vaccine.      Return in about 3 months (around 04/13/2024) for chronic follow up.  The patient indicates understanding of these issues and agrees with the plan.   Hannah Mcdaniel Search, FNP

## 2024-01-15 ENCOUNTER — Ambulatory Visit: Payer: Self-pay | Admitting: Family Medicine

## 2024-01-15 ENCOUNTER — Encounter: Payer: Self-pay | Admitting: Family Medicine

## 2024-01-15 DIAGNOSIS — E1169 Type 2 diabetes mellitus with other specified complication: Secondary | ICD-10-CM

## 2024-01-15 LAB — LIPID PANEL
Chol/HDL Ratio: 3.7 ratio (ref 0.0–4.4)
Cholesterol, Total: 220 mg/dL — ABNORMAL HIGH (ref 100–199)
HDL: 59 mg/dL (ref >39)
LDL Chol Calc (NIH): 101 mg/dL — ABNORMAL HIGH (ref 0–99)
Triglycerides: 356 mg/dL — ABNORMAL HIGH (ref 0–149)
VLDL Cholesterol Cal: 60 mg/dL — ABNORMAL HIGH (ref 5–40)

## 2024-01-19 ENCOUNTER — Other Ambulatory Visit: Payer: Self-pay

## 2024-01-19 ENCOUNTER — Ambulatory Visit (HOSPITAL_COMMUNITY): Admission: RE | Admit: 2024-01-19 | Discharge: 2024-01-19 | Attending: Surgery | Admitting: Surgery

## 2024-01-19 DIAGNOSIS — I70219 Atherosclerosis of native arteries of extremities with intermittent claudication, unspecified extremity: Secondary | ICD-10-CM | POA: Insufficient documentation

## 2024-01-19 LAB — VAS US ABI WITH/WO TBI
Left ABI: 0.93
Right ABI: 1.03

## 2024-01-19 NOTE — Progress Notes (Signed)
 Remote Loop Recorder Transmission

## 2024-01-24 ENCOUNTER — Other Ambulatory Visit: Payer: Self-pay | Admitting: Family Medicine

## 2024-01-24 DIAGNOSIS — I152 Hypertension secondary to endocrine disorders: Secondary | ICD-10-CM

## 2024-01-24 DIAGNOSIS — I10 Essential (primary) hypertension: Secondary | ICD-10-CM

## 2024-02-02 ENCOUNTER — Other Ambulatory Visit

## 2024-02-02 DIAGNOSIS — E1169 Type 2 diabetes mellitus with other specified complication: Secondary | ICD-10-CM

## 2024-02-02 LAB — LIPID PANEL
Chol/HDL Ratio: 3.5 ratio (ref 0.0–4.4)
Cholesterol, Total: 225 mg/dL — ABNORMAL HIGH (ref 100–199)
HDL: 64 mg/dL
LDL Chol Calc (NIH): 118 mg/dL — ABNORMAL HIGH (ref 0–99)
Triglycerides: 249 mg/dL — ABNORMAL HIGH (ref 0–149)
VLDL Cholesterol Cal: 43 mg/dL — ABNORMAL HIGH (ref 5–40)

## 2024-02-03 ENCOUNTER — Ambulatory Visit: Payer: Self-pay | Admitting: Family Medicine

## 2024-02-03 DIAGNOSIS — E1169 Type 2 diabetes mellitus with other specified complication: Secondary | ICD-10-CM

## 2024-02-03 DIAGNOSIS — G720 Drug-induced myopathy: Secondary | ICD-10-CM

## 2024-02-03 DIAGNOSIS — I6930 Unspecified sequelae of cerebral infarction: Secondary | ICD-10-CM

## 2024-02-03 NOTE — Telephone Encounter (Signed)
 Referral placed. Routed message to Mliss to see if we have any Repatha  samples coming in.

## 2024-02-09 NOTE — Progress Notes (Signed)
 " Office Note     CC:  One year follow up  Requesting Provider:  Joesph Annabella HERO, FNP  HPI: Hannah Mcdaniel is a 59 y.o. (07-23-1965) female presenting for 1 year follow-up  Patient was last seen in 2024.  She had multiple previous interventions in the right common and external iliac arteries.  Most recently, she underwent angioplasty and stenting of the right common iliac artery and right external iliac artery in September 2022.  In 2022, she was walking 3 miles a day.  On exam today, Hannah Mcdaniel was doing well, accompanied by her daughter.  Since her last visit, her mobility has declined.  She had a stroke in 2023 which affected the left side.  She uses a cane at baseline, and is not nearly as active as she once was.  Her daughter has taken over her medication list, and is helping her with a few of her daily tasks.  She remains relatively independent.  Denies symptoms of claudication, ischemic rest pain, tissue loss.  Notes heaviness in the right lower extremity stating, that sometimes she feels like she is dragging it.   Compliant on aspirin , Plavix , statin  Past Medical History:  Diagnosis Date   Anxiety    Arterial occlusive disease Nov. 2014   Arthritis    Claudication of lower extremity Nov. 2014   Right Lower Extremity rest pain   Colon polyps    Depression    Diabetes mellitus without complication (HCC)    GERD (gastroesophageal reflux disease)    Hyperlipidemia    Migraines    Vertigo     Past Surgical History:  Procedure Laterality Date   ABDOMINAL AORTAGRAM N/A 01/10/2013   Procedure: ABDOMINAL AORTAGRAM;  Surgeon: Krystal JULIANNA Doing, MD;  Location: [facility] CATH LAB;  Service: Cardiovascular;  Laterality: N/A;   ABDOMINAL AORTOGRAM W/LOWER EXTREMITY Bilateral 07/16/2018   Procedure: ABDOMINAL AORTOGRAM W/LOWER EXTREMITY;  Surgeon: Eliza Lonni RAMAN, MD;  Location: [facility] INVASIVE CV LAB;  Service: Cardiovascular;  Laterality: Bilateral;   ABDOMINAL AORTOGRAM W/LOWER EXTREMITY  N/A 10/05/2020   Procedure: ABDOMINAL AORTOGRAM W/LOWER EXTREMITY;  Surgeon: Eliza Lonni RAMAN, MD;  Location: [facility] INVASIVE CV LAB;  Service: Cardiovascular;  Laterality: N/A;   BUBBLE STUDY  03/26/2021   Procedure: BUBBLE STUDY;  Surgeon: Kate Lonni CROME, MD;  Location: [facility] ENDOSCOPY;  Service: Cardiovascular;;   CHOLECYSTECTOMY     Gall Bladder   iliac artery angioplasty and stent placement  01/10/13   LOWER EXTREMITY ANGIOGRAM Bilateral 01/10/2013   Procedure: LOWER EXTREMITY ANGIOGRAM;  Surgeon: Krystal JULIANNA Doing, MD;  Location: [facility] CATH LAB;  Service: Cardiovascular;  Laterality: Bilateral;   PERCUTANEOUS STENT INTERVENTION Right 01/10/2013   Procedure: PERCUTANEOUS STENT INTERVENTION;  Surgeon: Krystal JULIANNA Doing, MD;  Location: [facility] CATH LAB;  Service: Cardiovascular;  Laterality: Right;  rt common iliac stent   PERIPHERAL VASCULAR CATHETERIZATION N/A 11/26/2015   Procedure: Abdominal Aortogram w/Lower Extremity;  Surgeon: Lonni RAMAN Eliza, MD;  Location: [facility] INVASIVE CV LAB;  Service: Cardiovascular;  Laterality: N/A;   PERIPHERAL VASCULAR CATHETERIZATION Right 11/26/2015   Procedure: Peripheral Vascular Balloon Angioplasty;  Surgeon: Lonni RAMAN Eliza, MD;  Location: [facility] INVASIVE CV LAB;  Service: Cardiovascular;  Laterality: Right;  rt common iliac   PERIPHERAL VASCULAR INTERVENTION  07/16/2018   Procedure: PERIPHERAL VASCULAR INTERVENTION;  Surgeon: Eliza Lonni RAMAN, MD;  Location: [facility] INVASIVE CV LAB;  Service: Cardiovascular;;   PERIPHERAL VASCULAR INTERVENTION Right 10/05/2020   Procedure: PERIPHERAL VASCULAR INTERVENTION;  Surgeon: Eliza Lonni RAMAN,  MD;  Location: [facility] INVASIVE CV LAB;  Service: Cardiovascular;  Laterality: Right;  Common and exteral iliac artery   TEE WITHOUT CARDIOVERSION N/A 03/26/2021   Procedure: TRANSESOPHAGEAL ECHOCARDIOGRAM (TEE);  Surgeon: Kate Lonni CROME, MD;  Location: [facility] ENDOSCOPY;  Service: Cardiovascular;  Laterality: N/A;   TOTAL  ABDOMINAL HYSTERECTOMY      Social History   Socioeconomic History   Marital status: Married    Spouse name: Not on file   Number of children: 3   Years of education: Not on file   Highest education level: 10th grade  Occupational History   Not on file  Tobacco Use   Smoking status: Every Day    Current packs/day: 0.00    Average packs/day: 0.3 packs/day for 43.8 years (11.0 ttl pk-yrs)    Types: Cigarettes, E-cigarettes    Start date: 05/16/1977    Last attempt to quit: 03/21/2021    Years since quitting: 2.8   Smokeless tobacco: Never   Tobacco comments:    No cigarettes but vapes non-nicotine DJM 11/30/2023      Vaping Use   Vaping status: Never Used  Substance and Sexual Activity   Alcohol use: Not Currently    Alcohol/week: 1.0 standard drink of alcohol    Types: 1 Shots of liquor per week    Comment: occ   Drug use: No   Sexual activity: Yes    Partners: Male    Birth control/protection: Surgical  Other Topics Concern   Not on file  Social History Narrative   Right handed   Lives with husband   Not working   Drinks caffeine prn   One floor home   Social Drivers of Health   Tobacco Use: High Risk (01/15/2024)   Patient History    Smoking Tobacco Use: Every Day    Smokeless Tobacco Use: Never    Passive Exposure: Not on file  Financial Resource Strain: Low Risk (05/03/2022)   Overall Financial Resource Strain (CARDIA)    Difficulty of Paying Living Expenses: Not hard at all  Food Insecurity: No Food Insecurity (10/31/2022)   Hunger Vital Sign    Worried About Running Out of Food in the Last Year: Never true    Ran Out of Food in the Last Year: Never true  Transportation Needs: No Transportation Needs (10/31/2022)   PRAPARE - Administrator, Civil Service (Medical): No    Lack of Transportation (Non-Medical): No  Physical Activity: Insufficiently Active (05/03/2022)   Exercise Vital Sign    Days of Exercise per Week: 1 day    Minutes of  Exercise per Session: 20 min  Stress: No Stress Concern Present (05/03/2022)   [employer] of Occupational Health - Occupational Stress Questionnaire    Feeling of Stress : Not at all  Social Connections: Moderately Isolated (05/03/2022)   Social Connection and Isolation Panel    Frequency of Communication with Friends and Family: Three times a week    Frequency of Social Gatherings with Friends and Family: Once a week    Attends Religious Services: Never    Database Administrator or Organizations: No    Attends Engineer, Structural: Not on file    Marital Status: Married  Catering Manager Violence: Not At Risk (10/31/2022)   Humiliation, Afraid, Rape, and Kick questionnaire    Fear of Current or Ex-Partner: No    Emotionally Abused: No    Physically Abused: No    Sexually Abused: No  Depression (  PHQ2-9): Low Risk (01/14/2024)   Depression (PHQ2-9)    PHQ-2 Score: 4  Alcohol Screen: Low Risk (05/03/2022)   Alcohol Screen    Last Alcohol Screening Score (AUDIT): 1  Housing: Low Risk (10/31/2022)   Housing    Last Housing Risk Score: 0  Utilities: Not At Risk (10/31/2022)   AHC Utilities    Threatened with loss of utilities: No  Health Literacy: Not on file    Family History  Problem Relation Age of Onset   Diabetes Mother    Hyperlipidemia Mother    Hypertension Mother    Varicose Veins Mother    COPD Mother    Stroke Mother    Anxiety disorder Mother    Depression Mother    Ovarian cancer Mother    Acute myelogenous leukemia Father    Diabetes Brother    Hypertension Brother    Hyperlipidemia Brother    Stroke Maternal Grandmother    Anxiety disorder Maternal Grandmother    Depression Maternal Grandmother    Breast cancer Maternal Grandmother    Diabetes Maternal Grandmother    Heart disease Maternal Grandfather    Lung cancer Paternal Grandfather    Other Brother        Blood disease   Anxiety disorder Daughter    Migraines Daughter    Migraines  Daughter    Migraines Son    Seizures Son     Current Outpatient Medications  Medication Sig Dispense Refill   aspirin  EC 81 MG tablet Take 1 tablet (81 mg total) by mouth daily. Swallow whole. 30 tablet 12   Blood Glucose Monitoring Suppl (ACCU-CHEK GUIDE ME) w/Device KIT Test BS TID Dx E11.69 1 kit 0   buPROPion  (WELLBUTRIN  XL) 150 MG 24 hr tablet Take 1 tablet (150 mg total) by mouth 2 (two) times daily. 180 tablet 0   busPIRone  (BUSPAR ) 7.5 MG tablet Take 1 tablet (7.5 mg total) by mouth 2 (two) times daily. 180 tablet 3   clopidogrel  (PLAVIX ) 75 MG tablet Take 1 tablet (75 mg total) by mouth daily. 90 tablet 3   cyanocobalamin  (VITAMIN B12) 1000 MCG tablet Take 1,000 mcg by mouth daily.     donepezil  (ARICEPT ) 10 MG tablet Take 1 tablet (10 mg total) by mouth daily. 30 tablet 11   empagliflozin  (JARDIANCE ) 10 MG TABS tablet Take 1 tablet (10 mg total) by mouth daily. 90 tablet 0   Evolocumab  (REPATHA  SURECLICK) 140 MG/ML SOAJ Inject 140 mg into the skin every 14 (fourteen) days. 6 mL 1   ezetimibe  (ZETIA ) 10 MG tablet TAKE 1 TABLET BY MOUTH EVERY DAY 90 tablet 0   glucose blood (ACCU-CHEK GUIDE TEST) test strip Test BS TID Dx E11.69 300 each 3   Lancet Device MISC Apply to device as directed 100 each 12   losartan  (COZAAR ) 50 MG tablet TAKE 2 TABLETS BY MOUTH EVERY DAY 180 tablet 0   montelukast  (SINGULAIR ) 10 MG tablet TAKE 1 TABLET BY MOUTH EVERYDAY AT BEDTIME 90 tablet 1   No current facility-administered medications for this visit.    Allergies[1]   REVIEW OF SYSTEMS:  [X]  denotes positive finding, [ ]  denotes negative finding Cardiac  Comments:  Chest pain or chest pressure:    Shortness of breath upon exertion:    Short of breath when lying flat:    Irregular heart rhythm:        Vascular    Pain in calf, thigh, or hip brought on by ambulation:  Pain in feet at night that wakes you up from your sleep:     Blood clot in your veins:    Leg swelling:          Pulmonary    Oxygen at home:    Productive cough:     Wheezing:         Neurologic    Sudden weakness in arms or legs:     Sudden numbness in arms or legs:     Sudden onset of difficulty speaking or slurred speech:    Temporary loss of vision in one eye:     Problems with dizziness:         Gastrointestinal    Blood in stool:     Vomited blood:         Genitourinary    Burning when urinating:     Blood in urine:        Psychiatric    Major depression:         Hematologic    Bleeding problems:    Problems with blood clotting too easily:        Skin    Rashes or ulcers:        Constitutional    Fever or chills:      PHYSICAL EXAMINATION:  There were no vitals filed for this visit.  General:  WDWN in NAD; vital signs documented above Gait: Not observed HENT: WNL, normocephalic Pulmonary: normal non-labored breathing , without wheezing Cardiac: regular HR Abdomen: soft, NT, no masses Skin: without rashes Vascular Exam/Pulses:  Right Left  Radial 2+ (normal) 2+ (normal)  Ulnar    Femoral    Popliteal    DP  2+ (normal)  PT 2+ (normal)    Extremities: without ischemic changes, without Gangrene , without cellulitis; without open wounds;  Bluish discoloration to the right 1st and 2nd digits-this has been present for years Musculoskeletal: no muscle wasting or atrophy  Neurologic: A&O X 3;  No focal weakness or paresthesias are detected Psychiatric:  The pt has Normal affect.      Non-Invasive Vascular Imaging:     Abdominal Aorta Findings:  +-------------+-------+----------+----------+----------+--------+--------+  Location    AP (cm)Trans (cm)PSV (cm/s)Waveform  ThrombusComments  +-------------+-------+----------+----------+----------+--------+--------+  Proximal    2.05   2.13      84        biphasic                    +-------------+-------+----------+----------+----------+--------+--------+  Mid         1.96   1.91      110        biphasic                    +-------------+-------+----------+----------+----------+--------+--------+  Distal      2.19   2.19      39        monophasic                  +-------------+-------+----------+----------+----------+--------+--------+  RT EIA Distal0.6    0.6       131       triphasic                   +-------------+-------+----------+----------+----------+--------+--------+  LT CIA Prox  0.8    0.9       141       biphasic                    +-------------+-------+----------+----------+----------+--------+--------+  LT CIA Mid                    319       biphasic                    +-------------+-------+----------+----------+----------+--------+--------+  LT CIA Distal                 177       biphasic                    +-------------+-------+----------+----------+----------+--------+--------+  LT EIA Prox                   132       biphasic                    +-------------+-------+----------+----------+----------+--------+--------+  LT EIA Mid                    207       triphasic                   +-------------+-------+----------+----------+----------+--------+--------+  LT EIA Distal0.6    0.6       165       triphasic                   +-------------+-------+----------+----------+----------+--------+--------+   Right Stent(s):  +--------------------+--------+---------------+----------+------------+  CIA Origin - EIA MidPSV cm/sStenosis       Waveform  Comments      +--------------------+--------+---------------+----------+------------+  Prox to Stent       39                     monophasicdistal aorta  +--------------------+--------+---------------+----------+------------+  Proximal Stent      241     50-99% stenosistriphasic               +--------------------+--------+---------------+----------+------------+  Mid Stent           255     1-49% stenosis triphasic                +--------------------+--------+---------------+----------+------------+  Distal Stent        258     1-49% stenosis triphasic               +--------------------+--------+---------------+----------+------------+  Distal to Stent     131                    triphasic               +--------------------+--------+---------------+----------+------------+   ASSESSMENT/PLAN: Hannah Mcdaniel is a 59 y.o. female presenting with history of bilateral iliac artery stenting.  On physical exam, Hannah Mcdaniel had palpable pulses in the feet.  Imaging was reviewed demonstrating sluggish flow in the distal aorta with elevated velocities, specifically in the left mid common iliac artery to 319 cm/s.  Low flow in the terminal aorta has been present for a number of years, however I think that the left-sided stents are threatened as demonstrated by elevated velocities in the mid common iliac artery.  I had a long discussion with Hannah Mcdaniel regarding the above.  After discussing the risks and benefits of repeat angiography in an effort to define and improve flow for primary assisted patency of her previously placed stents, Hannah Mcdaniel.   Hannah FORBES Rim, MD [facility] [phone]     [1]  Allergies Allergen Reactions  Crestor  [Rosuvastatin ] Other (See Comments)    Severe muscle pain   Dorethia Mingle ] Rash    Can take low-dose aspirin .   Bempedoic Acid  Other (See Comments)    Bilateral shoulder pain.   Codeine Rash   Penicillins Rash    Has patient had a PCN reaction causing immediate rash, facial/tongue/throat swelling, SOB or lightheadedness with hypotension:Yes Has patient had a PCN reaction causing severe rash involving mucus membranes or skin necrosis:Yes Has patient had a PCN reaction that required hospitalization:No Has patient had a PCN reaction occurring within the last 10 years:No If all of the above answers are NO, then may Mcdaniel with  Cephalosporin use.    "

## 2024-02-11 ENCOUNTER — Other Ambulatory Visit: Payer: Self-pay

## 2024-02-11 ENCOUNTER — Ambulatory Visit: Payer: Self-pay

## 2024-02-11 ENCOUNTER — Encounter: Payer: Self-pay | Admitting: Vascular Surgery

## 2024-02-11 ENCOUNTER — Ambulatory Visit: Payer: Self-pay | Attending: Vascular Surgery | Admitting: Vascular Surgery

## 2024-02-11 VITALS — BP 160/81 | HR 62 | Temp 98.2°F | Resp 18 | Ht 61.0 in | Wt 151.1 lb

## 2024-02-11 DIAGNOSIS — T82856A Stenosis of peripheral vascular stent, initial encounter: Secondary | ICD-10-CM | POA: Diagnosis not present

## 2024-02-11 DIAGNOSIS — I639 Cerebral infarction, unspecified: Secondary | ICD-10-CM

## 2024-02-11 DIAGNOSIS — I70219 Atherosclerosis of native arteries of extremities with intermittent claudication, unspecified extremity: Secondary | ICD-10-CM | POA: Diagnosis not present

## 2024-02-11 DIAGNOSIS — Z95828 Presence of other vascular implants and grafts: Secondary | ICD-10-CM

## 2024-02-12 ENCOUNTER — Other Ambulatory Visit: Payer: Self-pay | Admitting: Family Medicine

## 2024-02-12 DIAGNOSIS — E1169 Type 2 diabetes mellitus with other specified complication: Secondary | ICD-10-CM

## 2024-02-12 DIAGNOSIS — E1129 Type 2 diabetes mellitus with other diabetic kidney complication: Secondary | ICD-10-CM

## 2024-02-12 LAB — CUP PACEART REMOTE DEVICE CHECK
Date Time Interrogation Session: 20260107231749
Implantable Pulse Generator Implant Date: 20230508

## 2024-02-12 NOTE — Progress Notes (Signed)
 Remote Loop Recorder Transmission

## 2024-02-13 ENCOUNTER — Ambulatory Visit: Payer: Self-pay | Admitting: Cardiovascular Disease

## 2024-02-15 ENCOUNTER — Telehealth: Payer: Self-pay

## 2024-02-15 NOTE — Telephone Encounter (Signed)
 Insurance denied angio.  Cancel for 1/14 per JER.  Needs add'l testing.  CT ordered by JER. LVM patient in order to inform.

## 2024-02-17 ENCOUNTER — Ambulatory Visit (HOSPITAL_COMMUNITY): Admission: RE | Admit: 2024-02-17 | Source: Home / Self Care | Admitting: Vascular Surgery

## 2024-02-17 ENCOUNTER — Other Ambulatory Visit: Payer: Self-pay

## 2024-02-17 ENCOUNTER — Encounter (HOSPITAL_COMMUNITY): Admission: RE | Payer: Self-pay | Source: Home / Self Care

## 2024-02-17 DIAGNOSIS — I70219 Atherosclerosis of native arteries of extremities with intermittent claudication, unspecified extremity: Secondary | ICD-10-CM

## 2024-02-17 SURGERY — ABDOMINAL AORTOGRAM W/LOWER EXTREMITY
Anesthesia: LOCAL

## 2024-02-22 ENCOUNTER — Ambulatory Visit (HOSPITAL_COMMUNITY)
Admission: RE | Admit: 2024-02-22 | Discharge: 2024-02-22 | Disposition: A | Discharge status: Home / Self Care | Source: Ambulatory Visit | Attending: Vascular Surgery | Admitting: Vascular Surgery

## 2024-02-22 DIAGNOSIS — E1169 Type 2 diabetes mellitus with other specified complication: Secondary | ICD-10-CM | POA: Diagnosis present

## 2024-02-22 DIAGNOSIS — I70219 Atherosclerosis of native arteries of extremities with intermittent claudication, unspecified extremity: Secondary | ICD-10-CM | POA: Insufficient documentation

## 2024-02-22 LAB — POCT I-STAT CREATININE: Creatinine, Ser: 1.1 mg/dL — ABNORMAL HIGH (ref 0.44–1.00)

## 2024-02-22 MED ORDER — IOHEXOL 350 MG/ML SOLN
100.0000 mL | Freq: Once | INTRAVENOUS | Status: AC | PRN
  Administered 2024-02-22: 100 mL via INTRAVENOUS

## 2024-02-24 ENCOUNTER — Other Ambulatory Visit: Payer: Self-pay | Admitting: Physician Assistant

## 2024-02-24 ENCOUNTER — Other Ambulatory Visit: Payer: Self-pay | Admitting: Family Medicine

## 2024-02-24 DIAGNOSIS — E1169 Type 2 diabetes mellitus with other specified complication: Secondary | ICD-10-CM

## 2024-02-24 DIAGNOSIS — J302 Other seasonal allergic rhinitis: Secondary | ICD-10-CM

## 2024-02-29 ENCOUNTER — Ambulatory Visit: Admitting: Podiatry

## 2024-03-04 ENCOUNTER — Encounter: Payer: Self-pay | Admitting: Vascular Surgery

## 2024-03-04 NOTE — Telephone Encounter (Signed)
 Patient was called regarding recent CT angio abdomen pelvis to evaluate concern for stenosis, specifically above the right sided common iliac artery stent.  Upon my evaluation, the right sided common iliac artery stent is patent, however I am concerned that the distal aspect demonstrates some amount of mural thrombus which appears to be greater than 60% of the flow lumen.  The distal external iliac artery stent is widely patent with no concerns.    I called Adda's daughter regarding the above, to discuss the CT findings.  While patent, I do think that the stent is threatened due to the amount of stenosis present.  I think that it is prudent that we move forward with right lower extremity angiography for primary assisted patency of the stent as she has had prior issues, and there is notable mural thrombus present.  My plan would be to perform an angiogram, followed by pullback pressure to assess the degree of stenosis.  If there is no stenosis, I would leave the lesion.  If there is stenosis, I would drug-coated balloon angioplasty versus restent.  After discussing the risks and benefits of the above, They elected to proceed.  Fonda FORBES Rim MD

## 2024-03-05 ENCOUNTER — Other Ambulatory Visit: Payer: Self-pay | Admitting: Family Medicine

## 2024-03-05 DIAGNOSIS — F17200 Nicotine dependence, unspecified, uncomplicated: Secondary | ICD-10-CM

## 2024-03-05 DIAGNOSIS — F339 Major depressive disorder, recurrent, unspecified: Secondary | ICD-10-CM

## 2024-03-07 ENCOUNTER — Other Ambulatory Visit: Payer: Self-pay

## 2024-03-07 DIAGNOSIS — T82856A Stenosis of peripheral vascular stent, initial encounter: Secondary | ICD-10-CM

## 2024-03-10 ENCOUNTER — Telehealth: Payer: Self-pay | Admitting: Family Medicine

## 2024-03-10 NOTE — Telephone Encounter (Unsigned)
 Copied from CRM [id number]. Topic: General - Billing Inquiry >> Mar 10, 2024 10:29 AM Susanna ORN wrote: Reason for CRM: Patient called to get a print out of what her & her husband owes at the clinic. Called [facility] & spoke with Mitzi & she stated she would have Rhonda to get it taken care of. Patient states her husband will come by either tomorrow or Monday to pick it up.

## 2024-03-11 NOTE — Telephone Encounter (Signed)
 Rhonda printed for both patients. Closing out the call

## 2024-03-16 ENCOUNTER — Ambulatory Visit (HOSPITAL_COMMUNITY): Admission: RE | Admit: 2024-03-16 | Source: Home / Self Care | Admitting: Vascular Surgery

## 2024-03-16 ENCOUNTER — Encounter (HOSPITAL_COMMUNITY): Admission: RE | Payer: Self-pay | Source: Home / Self Care

## 2024-03-17 ENCOUNTER — Ambulatory Visit: Payer: Self-pay | Admitting: Podiatry

## 2024-03-22 ENCOUNTER — Institutional Professional Consult (permissible substitution) (HOSPITAL_BASED_OUTPATIENT_CLINIC_OR_DEPARTMENT_OTHER): Payer: Self-pay | Admitting: Internal Medicine

## 2024-04-14 ENCOUNTER — Ambulatory Visit: Admitting: Family Medicine
